# Patient Record
Sex: Female | Born: 1975
Health system: Southern US, Community
[De-identification: ages and names within clinical notes are randomized; demographics above are authoritative.]

## PROBLEM LIST (undated history)

## (undated) DIAGNOSIS — K219 Gastro-esophageal reflux disease without esophagitis: Secondary | ICD-10-CM

## (undated) DIAGNOSIS — R519 Headache, unspecified: Secondary | ICD-10-CM

## (undated) DIAGNOSIS — R51 Headache: Secondary | ICD-10-CM

## (undated) DIAGNOSIS — B019 Varicella without complication: Secondary | ICD-10-CM

## (undated) DIAGNOSIS — T7840XA Allergy, unspecified, initial encounter: Secondary | ICD-10-CM

## (undated) DIAGNOSIS — Z91030 Bee allergy status: Secondary | ICD-10-CM

## (undated) HISTORY — DX: Allergy, unspecified, initial encounter: T78.40XA

## (undated) HISTORY — DX: Bee allergy status: Z91.030

## (undated) HISTORY — DX: Gastro-esophageal reflux disease without esophagitis: K21.9

## (undated) HISTORY — DX: Headache, unspecified: R51.9

## (undated) HISTORY — PX: TONSILLECTOMY: SUR1361

## (undated) HISTORY — DX: Varicella without complication: B01.9

## (undated) HISTORY — PX: ADENOIDECTOMY: SUR15

## (undated) HISTORY — DX: Headache: R51

---

## 1999-04-19 ENCOUNTER — Ambulatory Visit (HOSPITAL_COMMUNITY): Admission: RE | Admit: 1999-04-19 | Discharge: 1999-04-19 | Payer: Self-pay | Admitting: Family Medicine

## 1999-04-19 ENCOUNTER — Encounter: Payer: Self-pay | Admitting: Family Medicine

## 2000-07-15 ENCOUNTER — Other Ambulatory Visit: Admission: RE | Admit: 2000-07-15 | Discharge: 2000-07-15 | Payer: Self-pay | Admitting: *Deleted

## 2001-07-19 ENCOUNTER — Other Ambulatory Visit: Admission: RE | Admit: 2001-07-19 | Discharge: 2001-07-19 | Payer: Self-pay | Admitting: Obstetrics and Gynecology

## 2005-10-12 ENCOUNTER — Ambulatory Visit: Payer: Self-pay | Admitting: Internal Medicine

## 2005-10-15 ENCOUNTER — Ambulatory Visit: Payer: Self-pay | Admitting: Family Medicine

## 2005-10-29 ENCOUNTER — Ambulatory Visit: Payer: Self-pay | Admitting: Internal Medicine

## 2005-11-17 ENCOUNTER — Encounter: Payer: Self-pay | Admitting: Internal Medicine

## 2005-11-17 ENCOUNTER — Ambulatory Visit: Payer: Self-pay | Admitting: Internal Medicine

## 2005-12-17 ENCOUNTER — Ambulatory Visit: Payer: Self-pay | Admitting: Family Medicine

## 2005-12-25 ENCOUNTER — Ambulatory Visit: Payer: Self-pay | Admitting: *Deleted

## 2006-01-08 ENCOUNTER — Ambulatory Visit: Payer: Self-pay | Admitting: Internal Medicine

## 2006-02-03 ENCOUNTER — Ambulatory Visit: Payer: Self-pay | Admitting: Internal Medicine

## 2006-05-25 ENCOUNTER — Emergency Department (HOSPITAL_COMMUNITY): Admission: EM | Admit: 2006-05-25 | Discharge: 2006-05-25 | Payer: Self-pay | Admitting: *Deleted

## 2006-05-31 ENCOUNTER — Ambulatory Visit: Payer: Self-pay | Admitting: Internal Medicine

## 2006-06-18 ENCOUNTER — Ambulatory Visit: Payer: Self-pay | Admitting: Family Medicine

## 2006-08-02 ENCOUNTER — Ambulatory Visit: Payer: Self-pay | Admitting: Internal Medicine

## 2006-09-14 ENCOUNTER — Ambulatory Visit: Payer: Self-pay | Admitting: Internal Medicine

## 2007-02-07 ENCOUNTER — Ambulatory Visit: Payer: Self-pay | Admitting: Internal Medicine

## 2007-02-08 ENCOUNTER — Ambulatory Visit: Payer: Self-pay | Admitting: Internal Medicine

## 2007-02-09 ENCOUNTER — Ambulatory Visit: Payer: Self-pay | Admitting: Internal Medicine

## 2007-03-07 ENCOUNTER — Ambulatory Visit: Payer: Self-pay | Admitting: Internal Medicine

## 2007-04-01 ENCOUNTER — Ambulatory Visit: Payer: Self-pay | Admitting: Internal Medicine

## 2007-06-01 ENCOUNTER — Ambulatory Visit: Payer: Self-pay | Admitting: Internal Medicine

## 2007-06-04 ENCOUNTER — Ambulatory Visit (HOSPITAL_COMMUNITY): Admission: RE | Admit: 2007-06-04 | Discharge: 2007-06-04 | Payer: Self-pay | Admitting: Internal Medicine

## 2007-08-31 ENCOUNTER — Encounter (INDEPENDENT_AMBULATORY_CARE_PROVIDER_SITE_OTHER): Payer: Self-pay | Admitting: *Deleted

## 2008-02-09 ENCOUNTER — Ambulatory Visit: Payer: Self-pay | Admitting: Internal Medicine

## 2008-03-22 ENCOUNTER — Ambulatory Visit: Payer: Self-pay | Admitting: Internal Medicine

## 2008-03-22 ENCOUNTER — Encounter: Payer: Self-pay | Admitting: Internal Medicine

## 2008-03-22 LAB — CONVERTED CEMR LAB
Chlamydia, DNA Probe: NEGATIVE
GC Probe Amp, Genital: NEGATIVE
Testosterone: 19.44 ng/dL (ref 10–70)

## 2008-05-17 ENCOUNTER — Ambulatory Visit: Payer: Self-pay | Admitting: Internal Medicine

## 2008-11-07 ENCOUNTER — Ambulatory Visit: Payer: Self-pay | Admitting: Internal Medicine

## 2008-11-23 ENCOUNTER — Ambulatory Visit: Payer: Self-pay | Admitting: Internal Medicine

## 2008-12-11 ENCOUNTER — Ambulatory Visit (HOSPITAL_COMMUNITY): Admission: AD | Admit: 2008-12-11 | Discharge: 2008-12-11 | Payer: Self-pay | Admitting: Internal Medicine

## 2008-12-13 ENCOUNTER — Ambulatory Visit: Payer: Self-pay | Admitting: Internal Medicine

## 2008-12-22 ENCOUNTER — Ambulatory Visit (HOSPITAL_COMMUNITY): Admission: RE | Admit: 2008-12-22 | Discharge: 2008-12-22 | Payer: Self-pay | Admitting: Internal Medicine

## 2009-01-02 ENCOUNTER — Ambulatory Visit (HOSPITAL_BASED_OUTPATIENT_CLINIC_OR_DEPARTMENT_OTHER): Admission: RE | Admit: 2009-01-02 | Discharge: 2009-01-02 | Payer: Self-pay | Admitting: Internal Medicine

## 2009-01-05 ENCOUNTER — Ambulatory Visit: Payer: Self-pay | Admitting: Internal Medicine

## 2009-01-31 ENCOUNTER — Ambulatory Visit: Payer: Self-pay | Admitting: Internal Medicine

## 2009-05-01 ENCOUNTER — Ambulatory Visit: Payer: Self-pay | Admitting: Internal Medicine

## 2009-05-20 ENCOUNTER — Encounter: Payer: Self-pay | Admitting: Internal Medicine

## 2009-05-20 ENCOUNTER — Ambulatory Visit: Payer: Self-pay | Admitting: Internal Medicine

## 2009-05-20 DIAGNOSIS — R21 Rash and other nonspecific skin eruption: Secondary | ICD-10-CM | POA: Insufficient documentation

## 2009-07-11 ENCOUNTER — Ambulatory Visit: Payer: Self-pay | Admitting: Internal Medicine

## 2009-12-12 ENCOUNTER — Ambulatory Visit: Payer: Self-pay | Admitting: Internal Medicine

## 2010-03-31 ENCOUNTER — Ambulatory Visit: Payer: Self-pay | Admitting: Internal Medicine

## 2010-04-01 ENCOUNTER — Encounter (INDEPENDENT_AMBULATORY_CARE_PROVIDER_SITE_OTHER): Payer: Self-pay | Admitting: Internal Medicine

## 2010-06-24 ENCOUNTER — Inpatient Hospital Stay (HOSPITAL_COMMUNITY): Admission: AD | Admit: 2010-06-24 | Discharge: 2010-06-25 | Payer: Self-pay | Admitting: Obstetrics and Gynecology

## 2010-07-01 ENCOUNTER — Inpatient Hospital Stay (HOSPITAL_COMMUNITY): Admission: AD | Admit: 2010-07-01 | Discharge: 2010-07-03 | Payer: Self-pay | Admitting: Obstetrics and Gynecology

## 2010-07-04 ENCOUNTER — Observation Stay (HOSPITAL_COMMUNITY): Admission: AD | Admit: 2010-07-04 | Discharge: 2010-07-05 | Payer: Self-pay | Admitting: Obstetrics and Gynecology

## 2010-07-04 ENCOUNTER — Ambulatory Visit: Payer: Self-pay | Admitting: Nurse Practitioner

## 2010-09-23 ENCOUNTER — Ambulatory Visit (HOSPITAL_COMMUNITY): Admission: RE | Admit: 2010-09-23 | Discharge: 2010-09-25 | Payer: Self-pay | Admitting: Obstetrics and Gynecology

## 2010-09-23 ENCOUNTER — Encounter (INDEPENDENT_AMBULATORY_CARE_PROVIDER_SITE_OTHER): Payer: Self-pay | Admitting: Obstetrics and Gynecology

## 2011-02-26 LAB — COMPREHENSIVE METABOLIC PANEL
ALT: 20 U/L (ref 0–35)
Albumin: 3.4 g/dL — ABNORMAL LOW (ref 3.5–5.2)
Alkaline Phosphatase: 88 U/L (ref 39–117)
BUN: 13 mg/dL (ref 6–23)
Calcium: 9.1 mg/dL (ref 8.4–10.5)
Glucose, Bld: 95 mg/dL (ref 70–99)
Potassium: 3.8 mEq/L (ref 3.5–5.1)
Sodium: 140 mEq/L (ref 135–145)
Total Protein: 6.3 g/dL (ref 6.0–8.3)

## 2011-02-26 LAB — CBC
HCT: 39.6 % (ref 36.0–46.0)
Hemoglobin: 11.2 g/dL — ABNORMAL LOW (ref 12.0–15.0)
MCH: 31.7 pg (ref 26.0–34.0)
MCHC: 33.9 g/dL (ref 30.0–36.0)
MCHC: 34.6 g/dL (ref 30.0–36.0)
Platelets: 224 10*3/uL (ref 150–400)
RDW: 13 % (ref 11.5–15.5)
WBC: 5.4 10*3/uL (ref 4.0–10.5)

## 2011-02-26 LAB — DIFFERENTIAL
Lymphs Abs: 2.4 10*3/uL (ref 0.7–4.0)
Monocytes Absolute: 0.3 10*3/uL (ref 0.1–1.0)
Monocytes Relative: 6 % (ref 3–12)
Neutro Abs: 2.5 10*3/uL (ref 1.7–7.7)
Neutrophils Relative %: 47 % (ref 43–77)

## 2011-02-26 LAB — SURGICAL PCR SCREEN
MRSA, PCR: NEGATIVE
Staphylococcus aureus: NEGATIVE

## 2011-02-28 LAB — CBC
HCT: 30.9 % — ABNORMAL LOW (ref 36.0–46.0)
Hemoglobin: 10.9 g/dL — ABNORMAL LOW (ref 12.0–15.0)
Hemoglobin: 11.5 g/dL — ABNORMAL LOW (ref 12.0–15.0)
MCH: 33.1 pg (ref 26.0–34.0)
MCHC: 34.3 g/dL (ref 30.0–36.0)
MCV: 93.4 fL (ref 78.0–100.0)
MCV: 93.9 fL (ref 78.0–100.0)
Platelets: 211 10*3/uL (ref 150–400)
Platelets: 224 10*3/uL (ref 150–400)
Platelets: 233 10*3/uL (ref 150–400)
RBC: 3.29 MIL/uL — ABNORMAL LOW (ref 3.87–5.11)
RDW: 14.5 % (ref 11.5–15.5)
RDW: 14.6 % (ref 11.5–15.5)
RDW: 14.8 % (ref 11.5–15.5)
WBC: 10.3 10*3/uL (ref 4.0–10.5)
WBC: 15.5 10*3/uL — ABNORMAL HIGH (ref 4.0–10.5)

## 2011-02-28 LAB — COMPREHENSIVE METABOLIC PANEL
ALT: 22 U/L (ref 0–35)
AST: 18 U/L (ref 0–37)
Albumin: 2.4 g/dL — ABNORMAL LOW (ref 3.5–5.2)
Calcium: 8.7 mg/dL (ref 8.4–10.5)
GFR calc Af Amer: >60 mL/min (ref >60)
Sodium: 136 mEq/L (ref 135–145)
Total Protein: 5.5 g/dL — ABNORMAL LOW (ref 6.0–8.3)

## 2011-04-28 NOTE — Procedures (Signed)
NAME:  Kathy Gamble, START NO.:  [id number]   MEDICAL RECORD NO.:  [id number]          PATIENT TYPE:  OUT   LOCATION:  SLEEP CENTER                 FACILITY:  [facility]   PHYSICIAN:  Clinton D. Maple Hudson, MD, FCCP, FACPDATE OF BIRTH:  1976/03/02   DATE OF STUDY:  01/02/2009                            NOCTURNAL POLYSOMNOGRAM   REFERRING PHYSICIAN:  Dineen Kid. Reche Dixon, M.D.   INDICATION FOR STUDY:  Hypersomnia with sleep apnea.   EPWORTH SLEEPINESS SCORE:  7/24.  BMI 33.  Weight 230 pounds, height 70  inches, neck 14 inches.   MEDICATIONS:  Home medications are charted and reviewed.   SLEEP ARCHITECTURE:  Total sleep time 274 minutes with sleep efficiency  75.3%.  Stage I was 15.3%.  Stage II 74.7%.  Stage III absent.  REM 10%  of total sleep time.  Sleep latency 22 minutes.  REM latency 117  minutes.  Awake after sleep onset 67.5 minutes.  Arousal index 31.3.  The patient took APAP/butalbital/caffeine, and Tussin at DM at 8:30 p.m.   RESPIRATORY DATA:  Apnea-hypopnea index (AHI) 2.6 per hour, respiratory  disturbance index (RDI) 9.2 per hour.  A total of 12 events was scored  including 1 obstructive apnea and 11 hypopneas.  Events were mostly  associated to be supine sleep position.  REM AHI 17.5.  There were  insufficient events to qualify for CPAP titration by split protocol on  the study night.   OXYGEN DATA:  Moderate snoring with oxygen desaturation to a nadir of  90%.  Mean oxygen saturation through the study was 95% on room air.   CARDIAC DATA:  Normal sinus rhythm.   MOVEMENT-PARASOMNIA:  Occasional limb jerk with arousal, insignificant.  Bathroom x1.   IMPRESSIONS-RECOMMENDATIONS:  Occasional respiratory events with sleep  disturbance, AHI 2.6 per hour (normal range 0 to 5 per hour).  By  respiratory disturbance index it is 9.2 per hour.  She may be classified  as having very mild sleep apnea, probably more apparent on some sleep  nights than others.  She had  insufficient events to meet requirements  for CPAP titration by split protocol on the study night that could be  considered for CPAP trial if  more conservative measures including encouragement to sleep on sides,  weight loss, and treatment for any significant upper airway congestion  or obstruction were insufficient.      Clinton D. Maple Hudson, MD, [facility], FACP  Diplomate, Biomedical engineer of Sleep Medicine  Electronically Signed     CDY/MEDQ  D:  01/05/2009 13:20:19  T:  01/06/2009 16:10:96  Job:  [id number]

## 2011-09-03 ENCOUNTER — Inpatient Hospital Stay (HOSPITAL_COMMUNITY)
Admission: AD | Admit: 2011-09-03 | Discharge: 2011-09-03 | Disposition: A | Discharge status: Home / Self Care | Payer: Self-pay | Source: Ambulatory Visit | Attending: Obstetrics & Gynecology | Admitting: Obstetrics & Gynecology

## 2011-09-03 ENCOUNTER — Encounter (HOSPITAL_COMMUNITY): Payer: Self-pay | Admitting: *Deleted

## 2011-09-03 DIAGNOSIS — N946 Dysmenorrhea, unspecified: Secondary | ICD-10-CM

## 2011-09-03 LAB — WET PREP, GENITAL: Clue Cells Wet Prep HPF POC: NONE SEEN

## 2011-09-03 LAB — CBC
HCT: 39.2 % (ref 36.0–46.0)
Hemoglobin: 13.6 g/dL (ref 12.0–15.0)
MCH: 30.2 pg (ref 26.0–34.0)
MCHC: 34.7 g/dL (ref 30.0–36.0)

## 2011-09-03 MED ORDER — KETOROLAC TROMETHAMINE 60 MG/2ML IM SOLN: 60.0000 mg | Freq: Once | INTRAMUSCULAR | Status: DC

## 2011-09-03 NOTE — ED Provider Notes (Signed)
History     CSN: [id number] Arrival date & time: 09/03/2011 12:22 PM  Chief Complaint  Patient presents with  . Vaginal Bleeding    HPI  (Consider location/radiation/quality/duration/timing/severity/associated sxs/prior treatment)  HPILucy C Gamble is a 35 y.o. female who presents to [facility] for vaginal bleeding that started 3 days ago like a normal period but today passed a clot or tissue and has had shooting pains in lower abdomen with radiation to thighs. Had depo provera in August after that period.   No past medical history on file.  No past surgical history on file.  No family history on file.  History  Substance Use Topics  . Smoking status: Not on file  . Smokeless tobacco: Not on file  . Alcohol Use: Not on file    OB History    No data available      Review of Systems  Review of Systems  Constitutional: Positive for chills and fatigue. Negative for fever and diaphoresis.  HENT: Positive for sore throat. Negative for ear pain, congestion, facial swelling, neck pain, neck stiffness, dental problem and sinus pressure.   Eyes: Negative for photophobia, pain and discharge.  Respiratory: Positive for cough. Negative for chest tightness and wheezing.   Cardiovascular: Negative.   Gastrointestinal: Positive for abdominal pain and constipation. Negative for nausea, vomiting, diarrhea and abdominal distention.  Genitourinary: Positive for dysuria, vaginal bleeding, vaginal pain and pelvic pain. Negative for frequency, flank pain and difficulty urinating.  Musculoskeletal: Positive for back pain. Negative for myalgias and gait problem.  Skin: Negative for color change and rash.  Neurological: Positive for dizziness and headaches. Negative for speech difficulty, weakness, light-headedness and numbness.  Psychiatric/Behavioral:       Depression    Allergies  Latex  Home Medications  No current outpatient prescriptions on file.  Physical Exam    BP 132/74  Pulse 88   Temp(Src) 98.2 F (36.8 C) (Oral)  Resp 20  Ht 5\' 9"  (1.753 m)  Wt 243 lb (110.224 kg)  BMI 35.88 kg/m2  Physical Exam  Nursing note and vitals reviewed. Constitutional: She is oriented to person, place, and time. She appears well-developed and well-nourished. No distress.  Eyes: EOM are normal.  Neck: Neck supple.  Pulmonary/Chest: Effort normal.  Abdominal: Soft. There is no tenderness.  Genitourinary:       Small amount of blood vaginal vault. No cervical motion tenderness, no adnexal tenderness. No palpable enlargement of the uterus. Difficulty with exam due to patient habitus.  Musculoskeletal: Normal range of motion.  Neurological: She is alert and oriented to person, place, and time. No cranial nerve deficit.  Skin: Skin is warm and dry.    ED Course  Procedures  Results for orders placed during the hospital encounter of 09/03/11 (from the past 24 hour(s))  POCT PREGNANCY, URINE     Status: Normal   Collection Time   09/03/11  1:13 PM      Component Value Range   Preg Test, Ur NEGATIVE    CBC     Status: Abnormal   Collection Time   09/03/11  2:54 PM      Component Value Range   WBC 11.0 (*) 4.0 - 10.5 (K/uL)   RBC 4.51  3.87 - 5.11 (MIL/uL)   Hemoglobin 13.6  12.0 - 15.0 (g/dL)   HCT 40.9  81.1 - 91.4 (%)   MCV 86.9  78.0 - 100.0 (fL)   MCH 30.2  26.0 - 34.0 (pg)   MCHC  34.7  30.0 - 36.0 (g/dL)   RDW 16.1  09.6 - 04.5 (%)   Platelets 275  150 - 400 (K/uL)   Assessment:  Dysmenorrhea  Plan:  Ibuprofen prn   Return as needed   [facility], NP 09/07/11 0221  Kerrie Buffalo, NP 09/09/11 1101

## 2011-09-03 NOTE — Progress Notes (Signed)
Been on period for 3 days, passed something ( brought in a mass that she passed). Hadn't had period in a long time, had depo Aug.

## 2011-09-04 LAB — GC/CHLAMYDIA PROBE AMP, GENITAL
Chlamydia, DNA Probe: NEGATIVE
GC Probe Amp, Genital: NEGATIVE

## 2011-09-11 NOTE — ED Provider Notes (Signed)
Attestation of Attending Supervision of Advanced Practitioner: Evaluation and management procedures were performed by the PA/NP/CNM/OB Fellow under my supervision/collaboration. Chart reviewed and agree with management and plan.  Tyke Outman A 09/11/2011 3:12 PM   

## 2011-12-27 ENCOUNTER — Emergency Department (HOSPITAL_COMMUNITY)
Admission: EM | Admit: 2011-12-27 | Discharge: 2011-12-27 | Disposition: A | Discharge status: Home / Self Care | Payer: Medicaid Other | Source: Home / Self Care | Attending: Emergency Medicine | Admitting: Emergency Medicine

## 2011-12-27 ENCOUNTER — Encounter (HOSPITAL_COMMUNITY): Payer: Self-pay | Admitting: *Deleted

## 2011-12-27 ENCOUNTER — Emergency Department (INDEPENDENT_AMBULATORY_CARE_PROVIDER_SITE_OTHER): Payer: Medicaid Other

## 2011-12-27 DIAGNOSIS — H109 Unspecified conjunctivitis: Secondary | ICD-10-CM

## 2011-12-27 DIAGNOSIS — J40 Bronchitis, not specified as acute or chronic: Secondary | ICD-10-CM

## 2011-12-27 MED ORDER — BENZONATATE 200 MG PO CAPS: 200.0000 mg | ORAL_CAPSULE | Freq: Three times a day (TID) | ORAL | Status: AC | PRN

## 2011-12-27 MED ORDER — ALBUTEROL SULFATE HFA 108 (90 BASE) MCG/ACT IN AERS: 1.0000 | INHALATION_SPRAY | Freq: Four times a day (QID) | RESPIRATORY_TRACT | Status: DC | PRN

## 2011-12-27 MED ORDER — AZITHROMYCIN 250 MG PO TABS: ORAL_TABLET | ORAL | Status: AC

## 2011-12-27 MED ORDER — POLYMYXIN B-TRIMETHOPRIM 10000-0.1 UNIT/ML-% OP SOLN: 1.0000 [drp] | OPHTHALMIC | Status: AC

## 2011-12-27 MED ORDER — ACETAMINOPHEN-CODEINE #3 300-30 MG PO TABS: 1.0000 | ORAL_TABLET | ORAL | Status: AC | PRN

## 2011-12-27 NOTE — ED Notes (Signed)
Co sorethroat with eyes crusting and red x 6 days

## 2011-12-27 NOTE — ED Provider Notes (Signed)
Chief Complaint  Patient presents with  . Sore Throat    History of Present Illness:  Kathy Gamble has had a 2 to three-day history of sore throat, headache, redness of the eyes, nasal congestion with rhinorrhea and sneezing, yellowish drainage, sinus pressure, cough productive of bloody sputum, and aching in her back and her knees.  Review of Systems:  Other than noted above, the patient denies any of the following symptoms. Systemic:  No fever, chills, sweats, fatigue, myalgias, headache, or anorexia. Eye:  No redness, pain or drainage. ENT:  No earache, nasal congestion, rhinorrhea, sinus pressure, or sore throat. Lungs:  No cough, sputum production, wheezing, shortness of breath. Or chest pain. GI:  No nausea, vomiting, abdominal pain or diarrhea. Skin:  No rash or itching.  PMFSH:  Past medical history, family history, social history, meds, and allergies were reviewed.  Physical Exam:   Vital signs:  BP 127/82  Pulse 90  Temp(Src) 98.4 F (36.9 C) (Oral)  Resp 18  SpO2 97% General:  Alert, in no distress. Eye:  No conjunctival injection or drainage. ENT:  TMs and canals were normal, without erythema or inflammation.  Nasal mucosa was clear and uncongested, without drainage.  Mucous membranes were moist.  Pharynx was clear, without exudate or drainage.  There were no oral ulcerations or lesions. Neck:  Supple, no adenopathy, tenderness or mass. Lungs:  No respiratory distress.  Lungs were clear to auscultation, without wheezes, rales or rhonchi.  Breath sounds were clear and equal bilaterally. Heart:  Regular rhythm, without gallops, murmers or rubs. Skin:  Clear, warm, and dry, without rash or lesions.  Labs:      Radiology:  Dg Chest 2 View  12/27/2011  *RADIOLOGY REPORT*  Clinical Data: Sore throat.  Spitting up blood.  CHEST - 2 VIEW  Comparison: None.  Findings: Lungs are clear.  Heart size is normal.  No pneumothorax or pleural fluid.  No focal bony abnormality.  IMPRESSION: No  acute disease.  Original Report Authenticated By: Bernadene Bell. Maricela Curet, M.D.    Medications given in UCC:  None  Assessment:   Diagnoses that have been ruled out:  None  Diagnoses that are still under consideration:  None  Final diagnoses:  Bronchitis  Conjunctivitis      Plan:   1.  The following meds were prescribed:   New Prescriptions   ACETAMINOPHEN-CODEINE (TYLENOL #3) 300-30 MG PER TABLET    Take 1-2 tablets by mouth every 4 (four) hours as needed for pain.   ALBUTEROL (PROVENTIL HFA;VENTOLIN HFA) 108 (90 BASE) MCG/ACT INHALER    Inhale 1-2 puffs into the lungs every 6 (six) hours as needed for wheezing.   AZITHROMYCIN (ZITHROMAX Z-PAK) 250 MG TABLET    Take as directed.   BENZONATATE (TESSALON) 200 MG CAPSULE    Take 1 capsule (200 mg total) by mouth 3 (three) times daily as needed for cough.   TRIMETHOPRIM-POLYMYXIN B (POLYTRIM) OPHTHALMIC SOLUTION    Place 1 drop into both eyes every 4 (four) hours.   2.  The patient was instructed in symptomatic care and handouts were given. 3.  The patient was told to return if becoming worse in any way, if no better in 3 or 4 days, and given some red flag symptoms that would indicate earlier return.   Roque Lias, MD 12/27/11 2014

## 2012-04-28 ENCOUNTER — Emergency Department (HOSPITAL_COMMUNITY)
Admission: EM | Admit: 2012-04-28 | Discharge: 2012-04-29 | Disposition: A | Discharge status: Home / Self Care | Payer: No Typology Code available for payment source | Attending: Emergency Medicine | Admitting: Emergency Medicine

## 2012-04-28 ENCOUNTER — Encounter (HOSPITAL_COMMUNITY): Payer: Self-pay

## 2012-04-28 DIAGNOSIS — IMO0002 Reserved for concepts with insufficient information to code with codable children: Secondary | ICD-10-CM | POA: Insufficient documentation

## 2012-04-28 MED ORDER — CEFIXIME 400 MG PO TABS
ORAL_TABLET | ORAL | Status: AC
  Administered 2012-04-28: 400 mg via ORAL
  Filled 2012-04-28: qty 1

## 2012-04-28 MED ORDER — AZITHROMYCIN 1 G PO PACK
PACK | ORAL | Status: AC
  Administered 2012-04-28: 1 g via ORAL
  Filled 2012-04-28: qty 1

## 2012-04-28 MED ORDER — PROMETHAZINE HCL 25 MG PO TABS
ORAL_TABLET | ORAL | Status: AC
  Administered 2012-04-28: 25 mg via ORAL
  Filled 2012-04-28: qty 3

## 2012-04-28 MED ORDER — METRONIDAZOLE 500 MG PO TABS
ORAL_TABLET | ORAL | Status: AC
  Administered 2012-04-28: 2000 mg via ORAL
  Filled 2012-04-28: qty 4

## 2012-04-28 MED ORDER — LEVONORGESTREL 0.75 MG PO TABS
ORAL_TABLET | ORAL | Status: AC
  Administered 2012-04-28: 23:00:00 via ORAL
  Filled 2012-04-28: qty 2
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
## 2012-04-28 NOTE — ED Notes (Signed)
Pt taken to SANE room by SANE nurse. 

## 2012-04-28 NOTE — ED Notes (Signed)
SANE RN and PD at bedside.

## 2012-04-28 NOTE — ED Notes (Signed)
Pt to RR.  Gave pt cup for sample but advised not to use wipes.

## 2012-05-02 LAB — POCT PREGNANCY, URINE: Preg Test, Ur: NEGATIVE

## 2012-07-20 ENCOUNTER — Emergency Department: Payer: Self-pay | Admitting: Emergency Medicine

## 2013-07-20 ENCOUNTER — Emergency Department: Payer: Self-pay | Admitting: Emergency Medicine

## 2014-05-19 ENCOUNTER — Emergency Department: Payer: Self-pay | Admitting: Emergency Medicine

## 2014-10-15 ENCOUNTER — Encounter (HOSPITAL_COMMUNITY): Payer: Self-pay

## 2014-12-28 ENCOUNTER — Encounter (INDEPENDENT_AMBULATORY_CARE_PROVIDER_SITE_OTHER): Payer: Self-pay

## 2014-12-28 ENCOUNTER — Encounter: Payer: Self-pay | Admitting: Internal Medicine

## 2014-12-28 ENCOUNTER — Ambulatory Visit (INDEPENDENT_AMBULATORY_CARE_PROVIDER_SITE_OTHER): Payer: 59 | Admitting: Internal Medicine

## 2014-12-28 VITALS — BP 118/82 | HR 76 | Temp 98.4°F | Ht 67.85 in | Wt 246.0 lb

## 2014-12-28 DIAGNOSIS — R519 Headache, unspecified: Secondary | ICD-10-CM | POA: Insufficient documentation

## 2014-12-28 DIAGNOSIS — K219 Gastro-esophageal reflux disease without esophagitis: Secondary | ICD-10-CM | POA: Insufficient documentation

## 2014-12-28 DIAGNOSIS — J302 Other seasonal allergic rhinitis: Secondary | ICD-10-CM | POA: Insufficient documentation

## 2014-12-28 DIAGNOSIS — R51 Headache: Secondary | ICD-10-CM

## 2014-12-28 DIAGNOSIS — L989 Disorder of the skin and subcutaneous tissue, unspecified: Secondary | ICD-10-CM

## 2014-12-28 MED ORDER — OMEPRAZOLE 40 MG PO CPDR: 40.0000 mg | DELAYED_RELEASE_CAPSULE | Freq: Every day | ORAL | Status: DC

## 2014-12-28 NOTE — Progress Notes (Signed)
HPI  Pt presents to the clinic today to establish care. She is transferring care from [facility] NP.  She does have some concerns today about some skin lesions on her back. She reports that she was told 1 year ago that this was precancerous and she needs to have it removed. She lost her insurance and was not able to have it biopsied. She does request referral for dermatology for further evaluation.  Flu: yearly, needs one today Tetanus: unsure LMP: 12/15/14 Pap Smear: > 2 years ago Dentist: as needed   GERD: She reports that she takes Prilosec OTC daily. She continues to have reflux symptoms despite being on the Prilosec. She knows that certain foods cause the reflux but she continues to eat them anyway.  Frequent Headaches: She reports that she wakes up every day and goes to bed every day with a headache. She reports that it starts in her forehead and usually wrap around her eyes. She denies blurred vision or dizziness. She does have sensitivity to light but not sound. She denies N/V. She also takes Ibuprofen OTC which does seem to help.  Allergy: Taking zyrtec daily.   Past Medical History  Diagnosis Date  . Chicken pox   . GERD (gastroesophageal reflux disease)   . Frequent headaches   . Allergy     Current Outpatient Prescriptions  Medication Sig Dispense Refill  . cetirizine (ZYRTEC) 10 MG tablet Take 10 mg by mouth daily.    Marland Kitchen topiramate (TOPAMAX) 100 MG tablet TAKE ONE TABLET BY MOUTH AT BEDTIME     No current facility-administered medications for this visit.    Allergies  Allergen Reactions  . Bee Venom Itching  . Latex Rash    The powder in the gloves causes rash    Family History  Problem Relation Age of Onset  . Stroke Mother   . Mental retardation Mother   . Cancer Maternal Grandmother   . Cancer Paternal Grandmother     Breast  . Heart disease Paternal Grandfather     History   Social History  . Marital Status: Married    Spouse Name: N/A   Number of Children: N/A  . Years of Education: N/A   Occupational History  . Not on file.   Social History Main Topics  . Smoking status: Never Smoker   . Smokeless tobacco: Not on file  . Alcohol Use: No  . Drug Use: No  . Sexual Activity: Yes    Birth Control/ Protection: Injection   Other Topics Concern  . Not on file   Social History Narrative    ROS:  Constitutional: Denies fever, malaise, fatigue, headache or abrupt weight changes.  Respiratory: Denies difficulty breathing, shortness of breath, cough or sputum production.   Cardiovascular: Denies chest pain, chest tightness, palpitations or swelling in the hands or feet.  Gastrointestinal: Pt reports reflux. Denies abdominal pain, bloating, constipation, diarrhea or blood in the stool.  Skin: Pt reports abnormal lesion on back. Denies redness, rashes, ulcercations.  Neurological: Denies dizziness, difficulty with memory, difficulty with speech or problems with balance and coordination.   No other specific complaints in a complete review of systems (except as listed in HPI above).  PE:  BP 118/82 mmHg  Pulse 76  Temp(Src) 98.4 F (36.9 C) (Oral)  Ht 5' 7.85" (1.723 m)  Wt 246 lb (111.585 kg)  BMI 37.59 kg/m2  SpO2 98%  LMP 12/15/2014 Wt Readings from Last 3 Encounters:  12/28/14 246 lb (111.585 kg)  09/03/11 243 lb (110.224 kg)  05/20/09 275 lb (124.739 kg)    General: Appears herstated age, obese but well developed, well nourished in NAD. HEENT: Head: normal shape and size; Eyes: sclera white, no icterus, conjunctiva pink; Ears: Tm's gray and intact, normal light reflex; Nose: mucosa pink and moist, septum midline; Throat/Mouth: Teeth present, mucosa pink and moist, no lesions or ulcerations noted.  Cardiovascular: Normal rate and rhythm. S1,S2 noted.  No murmur, rubs or gallops noted.  Pulmonary/Chest: Normal effort and positive vesicular breath sounds. No respiratory distress. No wheezes, rales or ronchi  noted.  Abdomen: Soft and nontender. Normal bowel sounds, no bruits noted. No distention or masses noted. Liver, spleen and kidneys non palpable. Skin: 1 cm oval shaped irregular lesion noted on midline back. Darker in the center.   BMET    Component Value Date/Time   NA 140 09/22/2010 1020   K 3.8 09/22/2010 1020   CL 104 09/22/2010 1020   CO2 31 09/22/2010 1020   GLUCOSE 95 09/22/2010 1020   BUN 13 09/22/2010 1020   CREATININE 0.64 09/22/2010 1020   CALCIUM 9.1 09/22/2010 1020   GFRNONAA >60 09/22/2010 1020   GFRAA  09/22/2010 1020    >60        The eGFR has been calculated using the MDRD equation. This calculation has not been validated in all clinical situations. eGFR's persistently <60 mL/min signify possible Chronic Kidney Disease.    Lipid Panel  No results found for: CHOL, TRIG, HDL, CHOLHDL, VLDL, LDLCALC  CBC    Component Value Date/Time   WBC 11.0* 09/03/2011 1454   RBC 4.51 09/03/2011 1454   HGB 13.6 09/03/2011 1454   HCT 39.2 09/03/2011 1454   PLT 275 09/03/2011 1454   MCV 86.9 09/03/2011 1454   MCH 30.2 09/03/2011 1454   MCHC 34.7 09/03/2011 1454   RDW 12.7 09/03/2011 1454   LYMPHSABS 2.4 09/22/2010 1020   MONOABS 0.3 09/22/2010 1020   EOSABS 0.1 09/22/2010 1020   BASOSABS 0.0 09/22/2010 1020    Hgb A1C No results found for: HGBA1C   Assessment and Plan:  Skin lesion of back:  Will refer to derm for further and evaluation and treatment  Make an appt for physical ASAP

## 2014-12-28 NOTE — Assessment & Plan Note (Signed)
Will start Prilosec 40 mg daily Handout given on "diet information for GERD"

## 2014-12-28 NOTE — Assessment & Plan Note (Signed)
Symptoms controlled on zyrtec

## 2014-12-28 NOTE — Progress Notes (Signed)
Pre visit review using our clinic review tool, if applicable. No additional management support is needed unless otherwise documented below in the visit note. 

## 2014-12-28 NOTE — Patient Instructions (Signed)

## 2014-12-28 NOTE — Assessment & Plan Note (Signed)
On topomax daily Discussed daily ibuprofen use- she will try to cut down

## 2014-12-31 ENCOUNTER — Ambulatory Visit: Payer: Medicaid Other | Admitting: Internal Medicine

## 2015-01-02 ENCOUNTER — Encounter: Payer: Self-pay | Admitting: Internal Medicine

## 2015-01-02 ENCOUNTER — Other Ambulatory Visit (HOSPITAL_COMMUNITY)
Admission: RE | Admit: 2015-01-02 | Discharge: 2015-01-02 | Disposition: A | Discharge status: Home / Self Care | Payer: 59 | Source: Ambulatory Visit | Attending: Internal Medicine | Admitting: Internal Medicine

## 2015-01-02 ENCOUNTER — Ambulatory Visit (INDEPENDENT_AMBULATORY_CARE_PROVIDER_SITE_OTHER): Payer: 59 | Admitting: Internal Medicine

## 2015-01-02 VITALS — BP 120/76 | HR 98 | Temp 98.1°F | Ht 67.85 in | Wt 247.0 lb

## 2015-01-02 DIAGNOSIS — N760 Acute vaginitis: Secondary | ICD-10-CM | POA: Diagnosis present

## 2015-01-02 DIAGNOSIS — Z113 Encounter for screening for infections with a predominantly sexual mode of transmission: Secondary | ICD-10-CM | POA: Diagnosis not present

## 2015-01-02 DIAGNOSIS — M545 Low back pain, unspecified: Secondary | ICD-10-CM

## 2015-01-02 DIAGNOSIS — T148 Other injury of unspecified body region: Secondary | ICD-10-CM

## 2015-01-02 DIAGNOSIS — Z124 Encounter for screening for malignant neoplasm of cervix: Secondary | ICD-10-CM

## 2015-01-02 DIAGNOSIS — Z1151 Encounter for screening for human papillomavirus (HPV): Secondary | ICD-10-CM | POA: Diagnosis present

## 2015-01-02 DIAGNOSIS — Z0000 Encounter for general adult medical examination without abnormal findings: Secondary | ICD-10-CM

## 2015-01-02 DIAGNOSIS — Z23 Encounter for immunization: Secondary | ICD-10-CM

## 2015-01-02 DIAGNOSIS — T148XXA Other injury of unspecified body region, initial encounter: Secondary | ICD-10-CM

## 2015-01-02 DIAGNOSIS — Z01419 Encounter for gynecological examination (general) (routine) without abnormal findings: Secondary | ICD-10-CM | POA: Insufficient documentation

## 2015-01-02 DIAGNOSIS — E669 Obesity, unspecified: Secondary | ICD-10-CM | POA: Insufficient documentation

## 2015-01-02 LAB — COMPREHENSIVE METABOLIC PANEL
ALK PHOS: 76 U/L (ref 39–117)
ALT: 11 U/L (ref 0–35)
AST: 14 U/L (ref 0–37)
Albumin: 3.7 g/dL (ref 3.5–5.2)
BILIRUBIN TOTAL: 0.4 mg/dL (ref 0.2–1.2)
BUN: 16 mg/dL (ref 6–23)
CO2: 29 mEq/L (ref 19–32)
CREATININE: 0.7 mg/dL (ref 0.40–1.20)
Calcium: 8.7 mg/dL (ref 8.4–10.5)
Chloride: 104 mEq/L (ref 96–112)
GFR: 99.46 mL/min (ref >60.00)
Glucose, Bld: 92 mg/dL (ref 70–99)
Potassium: 3.7 mEq/L (ref 3.5–5.1)
Sodium: 137 mEq/L (ref 135–145)
Total Protein: 6.5 g/dL (ref 6.0–8.3)

## 2015-01-02 LAB — CBC
HEMATOCRIT: 38.7 % (ref 36.0–46.0)
Hemoglobin: 13.3 g/dL (ref 12.0–15.0)
MCHC: 34.4 g/dL (ref 30.0–36.0)
MCV: 88.4 fl (ref 78.0–100.0)
Platelets: 295 10*3/uL (ref 150.0–400.0)
RBC: 4.38 Mil/uL (ref 3.87–5.11)
RDW: 13.1 % (ref 11.5–15.5)
WBC: 7.5 10*3/uL (ref 4.0–10.5)

## 2015-01-02 LAB — HEMOGLOBIN A1C: HEMOGLOBIN A1C: 5 % (ref 4.6–6.5)

## 2015-01-02 LAB — LIPID PANEL
CHOLESTEROL: 121 mg/dL (ref 0–200)
HDL: 40.3 mg/dL (ref >39.00)
LDL CALC: 66 mg/dL (ref 0–99)
NonHDL: 80.7
TRIGLYCERIDES: 76 mg/dL (ref 0.0–149.0)
Total CHOL/HDL Ratio: 3
VLDL: 15.2 mg/dL (ref 0.0–40.0)

## 2015-01-02 NOTE — Addendum Note (Signed)
Addended by: Desmond Dike on: 01/02/2015 04:55 PM   Modules accepted: Orders

## 2015-01-02 NOTE — Progress Notes (Signed)
Pre visit review using our clinic review tool, if applicable. No additional management support is needed unless otherwise documented below in the visit note. 

## 2015-01-02 NOTE — Addendum Note (Signed)
Addended by: Roena Malady on: 01/02/2015 04:34 PM   Modules accepted: Orders

## 2015-01-02 NOTE — Progress Notes (Signed)
Subjective:    Patient ID: Kathy Gamble, female    DOB: October 01, 1976, 39 y.o.   MRN: [id number]  HPI  Pt presents to the clinic today for her physical exam.  Flu: yearly, needs one today Tetanus: unsure LMP: 12/15/14 Pap Smear: > 2 years ago Dentist: as needed  Diet: she does not adhere to any specific diet plan at this time Exercise: she does not exercise  Review of Systems      Past Medical History  Diagnosis Date  . Chicken pox   . GERD (gastroesophageal reflux disease)   . Frequent headaches   . Allergy     Current Outpatient Prescriptions  Medication Sig Dispense Refill  . cetirizine (ZYRTEC) 10 MG tablet Take 10 mg by mouth daily.    Marland Kitchen omeprazole (PRILOSEC) 40 MG capsule Take 1 capsule (40 mg total) by mouth daily. 30 capsule 3  . topiramate (TOPAMAX) 100 MG tablet TAKE ONE TABLET BY MOUTH AT BEDTIME     No current facility-administered medications for this visit.    Allergies  Allergen Reactions  . Bee Venom Itching  . Latex Rash    The powder in the gloves causes rash    Family History  Problem Relation Age of Onset  . Stroke Mother   . Mental retardation Mother   . Cancer Maternal Grandmother   . Cancer Paternal Grandmother     Breast  . Heart disease Paternal Grandfather     History   Social History  . Marital Status: Married    Spouse Name: N/A    Number of Children: N/A  . Years of Education: N/A   Occupational History  . Not on file.   Social History Main Topics  . Smoking status: Never Smoker   . Smokeless tobacco: Not on file  . Alcohol Use: No  . Drug Use: No  . Sexual Activity: Yes    Birth Control/ Protection: None   Other Topics Concern  . Not on file   Social History Narrative     Constitutional: Denies fever, malaise, fatigue, headache or abrupt weight changes.  HEENT: Pt reports bilateral ear pain. Denies eye pain, eye redness, ringing in the ears, wax buildup, runny nose, nasal congestion, bloody nose, or sore  throat. Respiratory: Denies difficulty breathing, shortness of breath, cough or sputum production.   Cardiovascular: Denies chest pain, chest tightness, palpitations or swelling in the hands or feet.  Gastrointestinal: Denies abdominal pain, bloating, constipation, diarrhea or blood in the stool.  GU: Denies urgency, frequency, pain with urination, burning sensation, blood in urine, odor or discharge. Musculoskeletal: Pt reports low back pain. Denies decrease in range of motion, difficulty with gait, or joint pain and swelling.  Skin: Denies redness, rashes, lesions or ulcercations.  Neurological: Pt reports difficulty with memory. Denies dizziness, difficulty with speech or problems with balance and coordination.  Psych: Pt denies depression, anxiety, SI/HI.  No other specific complaints in a complete review of systems (except as listed in HPI above).  Objective:   Physical Exam  BP 120/76 mmHg  Pulse 98  Temp(Src) 98.1 F (36.7 C) (Oral)  Ht 5' 7.85" (1.723 m)  Wt 247 lb (112.038 kg)  BMI 37.74 kg/m2  SpO2 99%  LMP 12/15/2014 Wt Readings from Last 3 Encounters:  01/02/15 247 lb (112.038 kg)  12/28/14 246 lb (111.585 kg)  09/03/11 243 lb (110.224 kg)    General: Appears her stated age, obese but well developed, well nourished in NAD. Skin: Warm,  dry and intact. No rashes, lesions or ulcerations noted. HEENT: Head: normal shape and size, frontal and maxillary sinus tenderness noted; Eyes: sclera white, no icterus, conjunctiva pink, PERRLA and EOMs intact; Ears: Canals red but TM's gray and intact, normal light reflex; Nose: mucosa pink and moist, septum midline; Throat/Mouth: Teeth present, mucosa pink and moist, no exudate, lesions or ulcerations noted.  Neck:  Neck supple, trachea midline. No masses, lumps or thyromegaly present.  Cardiovascular: Normal rate and rhythm. S1,S2 noted.  No murmur, rubs or gallops noted. No JVD or BLE edema. No carotid bruits noted. Pulmonary/Chest:  Normal effort and positive vesicular breath sounds. No respiratory distress. No wheezes, rales or ronchi noted.  Abdomen: Soft and non in the RUQ. Normal bowel sounds, no bruits noted. No distention or masses noted. Liver, spleen and kidneys non palpable. Pelvic: Normal female anatomy. Cervical changes noted. Small amount of white, discharge noted. No CMT. Adnexa non palpable. Musculoskeletal: Normal flexion, extension and lateral rotation of the spine. No pain with palpation of the lumbar spine. Slight pain with palpation of the para lumbar muscles on the right. No signs of joint swelling. No difficulty with gait.  Neurological: Alert and oriented. Cranial nerves II-XII grossly intact. Coordination normal.  Psychiatric: Mood and affect normal. Behavior is normal. Judgment normal but thought process slowed.   BMET    Component Value Date/Time   NA 140 09/22/2010 1020   K 3.8 09/22/2010 1020   CL 104 09/22/2010 1020   CO2 31 09/22/2010 1020   GLUCOSE 95 09/22/2010 1020   BUN 13 09/22/2010 1020   CREATININE 0.64 09/22/2010 1020   CALCIUM 9.1 09/22/2010 1020   GFRNONAA >60 09/22/2010 1020   GFRAA  09/22/2010 1020    >60        The eGFR has been calculated using the MDRD equation. This calculation has not been validated in all clinical situations. eGFR's persistently <60 mL/min signify possible Chronic Kidney Disease.    Lipid Panel  No results found for: CHOL, TRIG, HDL, CHOLHDL, VLDL, LDLCALC  CBC    Component Value Date/Time   WBC 11.0* 09/03/2011 1454   RBC 4.51 09/03/2011 1454   HGB 13.6 09/03/2011 1454   HCT 39.2 09/03/2011 1454   PLT 275 09/03/2011 1454   MCV 86.9 09/03/2011 1454   MCH 30.2 09/03/2011 1454   MCHC 34.7 09/03/2011 1454   RDW 12.7 09/03/2011 1454   LYMPHSABS 2.4 09/22/2010 1020   MONOABS 0.3 09/22/2010 1020   EOSABS 0.1 09/22/2010 1020   BASOSABS 0.0 09/22/2010 1020    Hgb A1C No results found for: HGBA1C       Assessment & Plan:    Preventative Health Maintenance:  Flu and Tdap today Pap smear obtained- will call you with the results Will test for trich, gonorrhea or chlamydia Will check CBC, CMET, A1C and Lipid profile today Encouraged her to work on diet and exercise  Muscle Strain of right lower back:  Try Ibuprofen 600 mg TID prn for pain A heating pad may be helpful Stretching exercises given   RTC in 1 year or sooner if needed

## 2015-01-02 NOTE — Addendum Note (Signed)
Addended by: Roena Malady on: 01/02/2015 04:59 PM   Modules accepted: Orders

## 2015-01-02 NOTE — Patient Instructions (Signed)

## 2015-01-03 ENCOUNTER — Ambulatory Visit: Payer: 59 | Admitting: Internal Medicine

## 2015-01-08 ENCOUNTER — Ambulatory Visit (INDEPENDENT_AMBULATORY_CARE_PROVIDER_SITE_OTHER): Payer: 59 | Admitting: Internal Medicine

## 2015-01-08 ENCOUNTER — Encounter: Payer: Self-pay | Admitting: Internal Medicine

## 2015-01-08 VITALS — BP 126/84 | HR 76 | Temp 98.4°F | Wt 250.5 lb

## 2015-01-08 DIAGNOSIS — J0100 Acute maxillary sinusitis, unspecified: Secondary | ICD-10-CM

## 2015-01-08 LAB — CYTOLOGY - PAP

## 2015-01-08 MED ORDER — AMOXICILLIN-POT CLAVULANATE 875-125 MG PO TABS: 1.0000 | ORAL_TABLET | Freq: Two times a day (BID) | ORAL | Status: DC

## 2015-01-08 NOTE — Progress Notes (Signed)
Pre visit review using our clinic review tool, if applicable. No additional management support is needed unless otherwise documented below in the visit note. 

## 2015-01-08 NOTE — Patient Instructions (Signed)

## 2015-01-08 NOTE — Progress Notes (Signed)
HPI Kathy Gamble is a 39 y.o. female presenting to the clinic today with c/o of runny nose, blood-tinged yellow mucous in mouth, and facial pain x 2 weeks. Several bloody noses after using Flonase x 1 week. She can feel the nasal mucous draining down her throat and into her mouth, and thinks that the blood-tinged mucous in mouth came from nose. No sore throat, cough, chest congestion, or chest pain. Hx of sinus infections.  Review of Systems    Past Medical History  Diagnosis Date  . Chicken pox   . GERD (gastroesophageal reflux disease)   . Frequent headaches   . Allergy     Family History  Problem Relation Age of Onset  . Stroke Mother   . Mental retardation Mother   . Cancer Maternal Grandmother   . Cancer Paternal Grandmother     Breast  . Heart disease Paternal Grandfather     History   Social History  . Marital Status: Married    Spouse Name: N/A    Number of Children: N/A  . Years of Education: N/A   Occupational History  . Not on file.   Social History Main Topics  . Smoking status: Never Smoker   . Smokeless tobacco: Not on file  . Alcohol Use: No  . Drug Use: No  . Sexual Activity: Yes    Birth Control/ Protection: None   Other Topics Concern  . Not on file   Social History Narrative    Allergies  Allergen Reactions  . Bee Venom Itching  . Latex Rash    The powder in the gloves causes rash     Constitutional: Positive headache and fatigue. Denies fever or abrupt weight changes.  HEENT:  Positive runny nose, bloody nose, eye pain, ear pain, pressure behind the eyes, facial pain, nasal congestion. Denies sore throat, eye redness, ringing in the ears, or wax buildup. Respiratory: Denies cough, difficulty breathing or shortness of breath.  Cardiovascular: Denies chest pain, chest tightness, palpitations or swelling in the hands or feet.   No other specific complaints in a complete review of systems (except as listed in HPI above).  Objective:  BP  126/84 mmHg  Pulse 76  Temp(Src) 98.4 F (36.9 C) (Oral)  Wt 250 lb 8 oz (113.626 kg)  SpO2 98%  LMP 12/15/2014  General: Appears her stated age, obese but well developed in NAD. HEENT: Head: normal shape and size, sinus tenderness noted - maxillary; Eyes: sclera white, no icterus, conjunctiva pink; Ears: Tm's gray and intact, normal light reflex; Nose: erthymatous mucosa and moist, septum midline; Throat/Mouth: + PND. Teeth present, mucosa pink and moist, no exudate noted, no lesions or ulcerations noted.  Neck: Neck supple, trachea midline. No masses, lumps or thyromegaly present.  Cardiovascular: Normal rate and rhythm. S1,S2 noted.  No murmur, rubs or gallops noted.  Pulmonary/Chest: Normal effort and positive vesicular breath sounds. No respiratory distress. No wheezes, rales or ronchi noted.      Assessment & Plan:   Acute bacterial sinusitis  Augmentin BID for 10 days Stop Flonase use due to nose bleeds. Ok to try saline nasal spray OTC Continue zyrtec  RTC as needed or if symptoms persist.

## 2015-01-09 ENCOUNTER — Telehealth: Payer: Self-pay

## 2015-01-09 NOTE — Telephone Encounter (Signed)
Her potassium is normal. OK to take Augmentin.

## 2015-01-09 NOTE — Telephone Encounter (Signed)
Left message on voicemail.

## 2015-01-09 NOTE — Telephone Encounter (Signed)
Pt left v/m wanting to know if med sent to pharmacy on 01/08/15 (Augmentin) had potassium in it. Pt wants to know if potassium is low. Spoke with Cala Bradford at [facility] [address]; she said augmentin has potassium clavulanate and potassium is just enough to hold med together. Is this OK to tell pt or what to tell pt.

## 2015-01-10 NOTE — Telephone Encounter (Signed)
Patient returned your call.  Please call patient back at [phone].

## 2015-01-11 LAB — CERVICOVAGINAL ANCILLARY ONLY
CANDIDA VAGINITIS: POSITIVE — AB
Herpes: NEGATIVE

## 2015-01-11 NOTE — Telephone Encounter (Signed)
Called pt about derm referral. She is aware of Reginas comments about augmentin

## 2015-01-11 NOTE — Progress Notes (Signed)
Quick Note:  Lab letter mailed  ______

## 2015-01-16 ENCOUNTER — Telehealth: Payer: Self-pay

## 2015-01-16 NOTE — Telephone Encounter (Signed)
We did not treat her because she was not complaining of any symptoms. If she has any vaginal discharge or itching we should treat her with Diflucan.

## 2015-01-16 NOTE — Telephone Encounter (Signed)
Pt left v/m requesting cb about lab results; noted letter mailed; since lab result positive for candida albicans came in does that change the result note to pt?

## 2015-01-16 NOTE — Telephone Encounter (Signed)
Pt is aware as instructed--pt states she was calling to find out about her A1C--not what is listed below--pt is okay and went over results--denies discharge or vaginal Sx

## 2015-01-25 ENCOUNTER — Encounter: Payer: Self-pay | Admitting: Internal Medicine

## 2015-01-25 ENCOUNTER — Ambulatory Visit (INDEPENDENT_AMBULATORY_CARE_PROVIDER_SITE_OTHER): Payer: 59 | Admitting: Internal Medicine

## 2015-01-25 VITALS — BP 118/84 | HR 56 | Temp 98.0°F | Wt 248.5 lb

## 2015-01-25 DIAGNOSIS — H6983 Other specified disorders of Eustachian tube, bilateral: Secondary | ICD-10-CM

## 2015-01-25 DIAGNOSIS — B373 Candidiasis of vulva and vagina: Secondary | ICD-10-CM

## 2015-01-25 DIAGNOSIS — M545 Low back pain, unspecified: Secondary | ICD-10-CM

## 2015-01-25 DIAGNOSIS — B3731 Acute candidiasis of vulva and vagina: Secondary | ICD-10-CM

## 2015-01-25 MED ORDER — FLUCONAZOLE 150 MG PO TABS: 150.0000 mg | ORAL_TABLET | Freq: Once | ORAL | Status: DC

## 2015-01-25 NOTE — Patient Instructions (Signed)
Barotitis Media Barotitis media is inflammation of your middle ear. This occurs when the auditory tube (eustachian tube) leading from the back of your nose (nasopharynx) to your eardrum is blocked. This blockage may result from a cold, environmental allergies, or an upper respiratory infection. Unresolved barotitis media may lead to damage or hearing loss (barotrauma), which may become permanent. HOME CARE INSTRUCTIONS   Use medicines as recommended by your health care provider. Over-the-counter medicines will help unblock the canal and can help during times of air travel.  Do not put anything into your ears to clean or unplug them. Eardrops will not be helpful.  Do not swim, dive, or fly until your health care provider says it is all right to do so. If these activities are necessary, chewing gum with frequent, forceful swallowing may help. It is also helpful to hold your nose and gently blow to pop your ears for equalizing pressure changes. This forces air into the eustachian tube.  Only take over-the-counter or prescription medicines for pain, discomfort, or fever as directed by your health care provider.  A decongestant may be helpful in decongesting the middle ear and make pressure equalization easier. SEEK MEDICAL CARE IF:  You experience a serious form of dizziness in which you feel as if the room is spinning and you feel nauseated (vertigo).  Your symptoms only involve one ear. SEEK IMMEDIATE MEDICAL CARE IF:   You develop a severe headache, dizziness, or severe ear pain.  You have bloody or pus-like drainage from your ears.  You develop a fever.  Your problems do not improve or become worse. MAKE SURE YOU:   Understand these instructions.  Will watch your condition.  Will get help right away if you are not doing well or get worse. Document Released: 11/27/2000 Document Revised: 09/20/2013 Document Reviewed: 06/27/2013 Exit[employer] Patient Information 2015 [employer]. This  information is not intended to replace advice given to you by your health care provider. Make sure you discuss any questions you have with your health care provider.  

## 2015-01-25 NOTE — Progress Notes (Signed)
Subjective:    Patient ID: Kathy Gamble, female    DOB: 06-09-1976, 39 y.o.   MRN: [id number]  HPI  Pt presents to the clinic today with c/o dysuria and low back pain. She reports this started 5 days ago. She denies fever, chills, nausea. She has not noticed any blood in her urine. She has a slight vaginal discharge but denies odor, itching or burning. She is not sure if her back her because she needs to have a BM but reports her last BM was this morning. She has not had any blood in her stool and has not had any abdominal pain. Her pap from 01/02/15 did show yeast on exam but she was not treated as she was asymptomatic. She has not tried anything OTC.  She also c/o bilateral ear pain. She reports this started at the end of January. It did not improve with the Augmentin that she was given for the sinus infection. She has felt off balance at times but not dizzy. She denies any trauma to the head or other upper respiratory symptoms. She has not tried anything OTC for this.  She also c/o left toenail thickening and discoloration. She noticed this 6 months ago. She has never had anything like this before. She denies any injury to the toenail. She does not get pedicures. She has not tried anything OTC.  Review of Systems      Past Medical History  Diagnosis Date  . Chicken pox   . GERD (gastroesophageal reflux disease)   . Frequent headaches   . Allergy       Allergies  Allergen Reactions  . Bee Venom Itching  . Latex Rash    The powder in the gloves causes rash    Family History  Problem Relation Age of Onset  . Stroke Mother   . Mental retardation Mother   . Cancer Maternal Grandmother   . Cancer Paternal Grandmother     Breast  . Heart disease Paternal Grandfather     History   Social History  . Marital Status: Married    Spouse Name: N/A  . Number of Children: N/A  . Years of Education: N/A   Occupational History  . Not on file.   Social History Main Topics  .  Smoking status: Never Smoker   . Smokeless tobacco: Not on file  . Alcohol Use: No  . Drug Use: No  . Sexual Activity: Yes    Birth Control/ Protection: None   Other Topics Concern  . Not on file   Social History Narrative     Constitutional: Denies fever, malaise, fatigue, headache or abrupt weight changes.  HEENT: Pt reports bilateral ear pain. Denies eye pain, eye redness,  ringing in the ears, wax buildup, runny nose, nasal congestion, bloody nose, or sore throat. Respiratory: Denies difficulty breathing, shortness of breath, cough or sputum production.   Cardiovascular: Denies chest pain, chest tightness, palpitations or swelling in the hands or feet.  Gastrointestinal: Denies abdominal pain, bloating, constipation, diarrhea or blood in the stool.  GU: Pt reports dysuria. Denies urgency, frequency, blood in urine, odor or discharge. Musculoskeletal: Pt reports low back pain. Denies decrease in range of motion, difficulty with gait, muscle pain or joint pain and swelling.  Skin: Pt reports toenail discoloration. Denies redness, rashes, lesions or ulcercations.  Neurological: Pt reports sense of imbalance. Denies dizziness, difficulty with memory, difficulty with speech or problems with balance and coordination.   No other specific complaints  in a complete review of systems (except as listed in HPI above).  Objective:   Physical Exam   BP 118/84 mmHg  Pulse 56  Temp(Src) 98 F (36.7 C) (Oral)  Wt 248 lb 8 oz (112.719 kg)  SpO2 99%  LMP 12/15/2014 Wt Readings from Last 3 Encounters:  01/25/15 248 lb 8 oz (112.719 kg)  01/08/15 250 lb 8 oz (113.626 kg)  01/02/15 247 lb (112.038 kg)    General: Appears her stated age, obese in NAD. Skin: Warm, dry and intact. Toenail fungus noted of left great toenail. HEENT: Head: normal shape and size; Eyes: sclera white, no icterus, conjunctiva pink; Ears: Tm's pink but intact, normal light reflex, + effusion bilaterally; Nose: mucosa  pink and moist, septum midline; Throat/Mouth: Teeth present, mucosa pink and moist, no exudate, lesions or ulcerations noted.  Cardiovascular: Bradycardic with normal rhythm. S1,S2 noted.  No murmur, rubs or gallops noted.  Pulmonary/Chest: Normal effort and positive vesicular breath sounds. No respiratory distress. No wheezes, rales or ronchi noted.  Abdomen: Soft and nontender. Normal bowel sounds, no bruits noted. No distention or masses noted. Liver, spleen and kidneys non palpable.No CVA tenderness. Pelvic: Normal female anatomy. Small amount of white vaginal discharge noted at opening. No odor noted. Musculoskeletal: Normal flexion and extension of the spine. No pain with palpation of the thoracic and lumbar spine. Strength 5/5 BLE.   BMET    Component Value Date/Time   NA 137 01/02/2015 1518   K 3.7 01/02/2015 1518   CL 104 01/02/2015 1518   CO2 29 01/02/2015 1518   GLUCOSE 92 01/02/2015 1518   BUN 16 01/02/2015 1518   CREATININE 0.70 01/02/2015 1518   CALCIUM 8.7 01/02/2015 1518   GFRNONAA >60 09/22/2010 1020   GFRAA  09/22/2010 1020    >60        The eGFR has been calculated using the MDRD equation. This calculation has not been validated in all clinical situations. eGFR's persistently <60 mL/min signify possible Chronic Kidney Disease.    Lipid Panel     Component Value Date/Time   CHOL 121 01/02/2015 1518   TRIG 76.0 01/02/2015 1518   HDL 40.30 01/02/2015 1518   CHOLHDL 3 01/02/2015 1518   VLDL 15.2 01/02/2015 1518   LDLCALC 66 01/02/2015 1518    CBC    Component Value Date/Time   WBC 7.5 01/02/2015 1518   RBC 4.38 01/02/2015 1518   HGB 13.3 01/02/2015 1518   HCT 38.7 01/02/2015 1518   PLT 295.0 01/02/2015 1518   MCV 88.4 01/02/2015 1518   MCH 30.2 09/03/2011 1454   MCHC 34.4 01/02/2015 1518   RDW 13.1 01/02/2015 1518   LYMPHSABS 2.4 09/22/2010 1020   MONOABS 0.3 09/22/2010 1020   EOSABS 0.1 09/22/2010 1020   BASOSABS 0.0 09/22/2010 1020    Hgb  A1C Lab Results  Component Value Date   HGBA1C 5.0 01/02/2015        Assessment & Plan:   Dysuria:  Urinalysis normal No need to send urine culture Wet prep: + yeast, no trich, no clue cells eRx for Diflucan 150 mg PO x 1  Low back pain:  ? MSK in origin although pain not reproduced on exam She thinks it may be related to constipation but reports she is having daily BM's Try some Ibuprofen Stretching may also be useful  Eustachian Tube Dysfunction:  No evidence of infection on exam, abx not indicated Try Flonase OTC daily x 1-2 weeks If no improvement will  consider low dose steroid taper  RTC as needed or if symptoms persist or worsen

## 2015-01-25 NOTE — Progress Notes (Signed)
Pre visit review using our clinic review tool, if applicable. No additional management support is needed unless otherwise documented below in the visit note. 

## 2015-01-28 LAB — POCT URINALYSIS DIPSTICK
Bilirubin, UA: NEGATIVE
Blood, UA: NEGATIVE
Glucose, UA: NEGATIVE
Ketones, UA: NEGATIVE
Leukocytes, UA: NEGATIVE
Nitrite, UA: NEGATIVE
Protein, UA: NEGATIVE
Spec Grav, UA: >=1.03
Urobilinogen, UA: NEGATIVE
pH, UA: 5.5

## 2015-01-28 NOTE — Addendum Note (Signed)
Addended by: Roena Malady on: 01/28/2015 10:25 AM   Modules accepted: Orders

## 2015-02-04 ENCOUNTER — Telehealth: Payer: Self-pay

## 2015-02-04 NOTE — Telephone Encounter (Signed)
PLEASE NOTE: All timestamps contained within this report are represented as [place] Standard Time. CONFIDENTIALTY NOTICE: This fax transmission is intended only for the addressee. It contains information that is legally privileged, confidential or otherwise protected from use or disclosure. If you are not the intended recipient, you are strictly prohibited from reviewing, disclosing, copying using or disseminating any of this information or taking any action in reliance on or regarding this information. If you have received this fax in error, please notify us immediately by telephone so that we can arrange for its return to [place]. Phone: [phone], Toll-Free: [phone], Fax: [phone] Page: 1 of 1 Call Id: [id number] [facility] Day - Client TELEPHONE ADVICE RECORD [facility] Patient Name: Kathy Gamble Gender: Female DOB: 06-05-1976 Age: 39 Y 2 M 23 D Return Phone Number: Address: City/State/Zip: [place] [facility] [place] Day - Client Client Site [facility] [place] - Day Physician Nicki Reaper Contact Type Call [name] Name same [name] Phone Number n/a Relationship To Patient Self Is this call to report lab results? No Call Type General Information Initial Comment caller states is calling to get her biopsy results General Information Type Call Transferred Nurse Assessment Guidelines Guideline Title Affirmed Question Affirmed Notes Nurse Date/Time (Eastern Time) Disp. Time Lamount Cohen Time) Disposition Final User 02/04/2015 11:03:00 AM General Information Provided Yes Alfonse Alpers After Care Instructions Given Call Event Type User Date / Time Description

## 2015-02-04 NOTE — Telephone Encounter (Signed)
Left message on voicemail.

## 2015-02-04 NOTE — Telephone Encounter (Signed)
Pt saw derm for her biopsy. She needs to call them.

## 2015-02-07 ENCOUNTER — Telehealth: Payer: Self-pay

## 2015-02-07 NOTE — Telephone Encounter (Signed)
Have her try Priolsec in the morning and at night. She will need to be evaluated if she thinks she is getting sick. Otherwise she can try Mucinex, Delsym and a saline nasal spray OTC

## 2015-02-07 NOTE — Telephone Encounter (Signed)
Pt has been taking omeprazole in morning and tums at night and pt said indigestion is not a lot better. Pt wants to know if should take med at different time of day or what to do. Pt also said 02/05/15 pt started with prod cough with yellow phlegm that is blood tinged. Pt has nose bleeds on and off. No fever, SOB or wheezing. [facility] [address]. Pt request cb.Please advise.

## 2015-02-08 ENCOUNTER — Encounter: Payer: Self-pay | Admitting: Internal Medicine

## 2015-02-08 ENCOUNTER — Ambulatory Visit (INDEPENDENT_AMBULATORY_CARE_PROVIDER_SITE_OTHER): Payer: 59 | Admitting: Internal Medicine

## 2015-02-08 ENCOUNTER — Telehealth: Payer: Self-pay | Admitting: Internal Medicine

## 2015-02-08 VITALS — BP 116/78 | HR 76 | Temp 97.8°F | Wt 248.0 lb

## 2015-02-08 DIAGNOSIS — R519 Headache, unspecified: Secondary | ICD-10-CM

## 2015-02-08 DIAGNOSIS — R51 Headache: Secondary | ICD-10-CM

## 2015-02-08 MED ORDER — FLUTICASONE PROPIONATE 50 MCG/ACT NA SUSP: 2.0000 | Freq: Every day | NASAL | Status: DC

## 2015-02-08 NOTE — Telephone Encounter (Signed)
PT IS AWARE AS INSTRUCTED

## 2015-02-08 NOTE — Telephone Encounter (Signed)
Patient Name: Kathy Gamble  DOB: 11-20-1976    Initial Comment [name] states she coughed up yellow mucus with blood.    Nurse Assessment  Nurse: Deatra James RN, Corrie Dandy Date/Time Lamount Cohen Time): 02/08/2015 9:54:32 AM  Confirm and document reason for call. If symptomatic, describe symptoms. ---Patient states she is coughing up yellow mucus with one streak of blood in it.  Has the patient traveled out of the country within the last 30 days? ---No  Does the patient require triage? ---Yes  Related visit to physician within the last 2 weeks? ---No  Does the PT have any chronic conditions? (i.e. diabetes, asthma, etc.) ---No  Did the patient indicate they were pregnant? ---No     Guidelines    Guideline Title Affirmed Question Affirmed Notes  Coughing Up Blood [1] Coughing up yellow or green sputum AND [2] present > 5 days    Final Disposition User   See Physician within 24 Hours [place], RN, [place]

## 2015-02-08 NOTE — Progress Notes (Signed)
HPI  Pt presents to the clinic today with c/o sinus headache, nasal congestion, ear fullness and cough. She reports this started 5 days ago. She is blowing yellow mucous out of her nose. She is coughing up yellow mucous. She denies fever, chills or body aches. She is taking zyrtec daily. She has not had sick contacts. She does not smoke.  Review of Systems    Past Medical History  Diagnosis Date  . Chicken pox   . GERD (gastroesophageal reflux disease)   . Frequent headaches   . Allergy     Family History  Problem Relation Age of Onset  . Stroke Mother   . Mental retardation Mother   . Cancer Maternal Grandmother   . Cancer Paternal Grandmother     Breast  . Heart disease Paternal Grandfather     History   Social History  . Marital Status: Married    Spouse Name: N/A  . Number of Children: N/A  . Years of Education: N/A   Occupational History  . Not on file.   Social History Main Topics  . Smoking status: Never Smoker   . Smokeless tobacco: Not on file  . Alcohol Use: No  . Drug Use: No  . Sexual Activity: Yes    Birth Control/ Protection: None   Other Topics Concern  . Not on file   Social History Narrative    Allergies  Allergen Reactions  . Bee Venom Itching  . Latex Rash    The powder in the gloves causes rash     Constitutional: Positive headache. Denies fatigue, fever or abrupt weight changes.  HEENT:  Positive nasal congestion. Denies eye redness, ear pain, ringing in the ears, wax buildup, runny nose or sore throat. Respiratory: Positive cough. Denies difficulty breathing or shortness of breath.  Cardiovascular: Denies chest pain, chest tightness, palpitations or swelling in the hands or feet.   No other specific complaints in a complete review of systems (except as listed in HPI above).  Objective:  BP 116/78 mmHg  Pulse 76  Temp(Src) 97.8 F (36.6 C) (Oral)  Wt 248 lb (112.492 kg)  SpO2 99%   General: Appears her stated age, obese in  NAD. HEENT: Head: normal shape and size, no sinus tenderness noted; Eyes: sclera white, no icterus, conjunctiva pink; Ears: Tm's gray and intact, normal light reflex; Nose: mucosa pink and moist, septum midline; Throat/Mouth: Teeth present, mucosa pink and moist, no exudate noted, no lesions or ulcerations noted.  Neck: No adenopathy noted. Cardiovascular: Normal rate and rhythm. S1,S2 noted.  No murmur, rubs or gallops noted.  Pulmonary/Chest: Normal effort and positive vesicular breath sounds. No respiratory distress. No wheezes, rales or ronchi noted.      Assessment & Plan:   Sinus Headache  Can use a Neti Pot which can be purchased from your local drug store. Flonase 2 sprays each nostril for 3 days and then as needed. Continue Zyrtec If symptoms persist, consider ENT referral  RTC as needed or if symptoms persist.

## 2015-02-08 NOTE — Telephone Encounter (Signed)
Disregard previous message. [facility] note was placed in wrong pt chart

## 2015-02-08 NOTE — Telephone Encounter (Signed)
Call pt, given her elevated blood pressure, eye dialation, and chest pain, needs to go to ER

## 2015-02-08 NOTE — Progress Notes (Signed)
Pre visit review using our clinic review tool, if applicable. No additional management support is needed unless otherwise documented below in the visit note. 

## 2015-02-08 NOTE — Telephone Encounter (Signed)
Pt has appt today at 3:45 pm to see Nicki Reaper NP.

## 2015-02-08 NOTE — Telephone Encounter (Deleted)
PLEASE NOTE: All timestamps contained within this report are represented as [place] Standard Time. CONFIDENTIALTY NOTICE: This fax transmission is intended only for the addressee. It contains information that is legally privileged, confidential or otherwise protected from use or disclosure. If you are not the intended recipient, you are strictly prohibited from reviewing, disclosing, copying using or disseminating any of this information or taking any action in reliance on or regarding this information. If you have received this fax in error, please notify us immediately by telephone so that we can arrange for its return to us. Phone: [phone], Toll-Free: [phone], Fax: [phone] Page: 1 of 2 Call Id: [id number] [facility] Night - Client TELEPHONE ADVICE RECORD [facility] Patient Name: Kathy Gamble Gender: Female DOB: 05/09/1972 Age: 39 Y 8 M 29 D Return Phone Number: [phone] (Primary), [phone] (Secondary) Address: [address] City/State/Zip: [place] [address] Client [facility] Night - Client Client Site [facility] - Night Physician Baity, Regina Contact Type Call Call Type Triage / Clinical [name] Name Kristie Greeson Relationship To Patient Friend Return Phone Number [phone] (Primary) Chief Complaint CHEST PAIN (>=21 years) - pain, pressure, heaviness or tightness Initial Comment [name] states she is having tingling in her rt arm and her BP is 167/111 she is also diabetic doesn't have any test strips. And her eyes are dilating and tightness rt side of chest PreDisposition Did not know what to do Nurse Assessment Nurse: Clarke, RN, Rebecca Date/Time (Eastern Time): 02/07/2015 5:57:58 PM Confirm and document reason for call. If symptomatic, describe symptoms. ---[name] states she is having tingling in her rt arm and her BP is 167/111 she is also diabetic  doesn't have any test strips. And her eyes are dilating and tightness rt side of chest Has the patient traveled out of the country within the last 30 days? ---Not Applicable Does the patient require triage? ---Yes Related visit to physician within the last 2 weeks? ---No Does the PT have any chronic conditions? (i.e. diabetes, asthma, etc.) ---Unknown Did the patient indicate they were pregnant? ---No Guidelines Guideline Title Affirmed Question Affirmed Notes Nurse Date/Time (Eastern Time) Chest Pain SEVERE difficulty breathing (e.g., struggling for each breath, speaks in single words) Clarke, RN, Rebecca 02/07/2015 6:01:01 PM Disp. Time (Eastern Time) Disposition Final User 02/07/2015 5:53:48 PM Send to Urgent Queue Holland, Catherine 02/07/2015 6:03:36 PM Send To RN Personal Clarke, RN, Rebecca 02/07/2015 6:27:13 PM 911 Follow Up Call Attempted Clarke, RN, Rebecca Reason: no answer. PLEASE NOTE: All timestamps contained within this report are represented as Eastern Standard Time. CONFIDENTIALTY NOTICE: This fax transmission is intended only for the addressee. It contains information that is legally privileged, confidential or otherwise protected from use or disclosure. If you are not the intended recipient, you are strictly prohibited from reviewing, disclosing, copying using or disseminating any of this information or taking any action in reliance on or regarding this information. If you have received this fax in error, please notify us immediately by telephone so that we can arrange for its return to us. Phone: [phone], Toll-Free: [phone], Fax: [phone] Page: 2 of 2 Call Id: [id number] 02/07/2015 6:01:18 PM Call EMS 911 Now Yes Clarke, RN, Rebecca [name] Understands: Yes Disagree/Comply: Comply Care Advice Given Per Guideline CALL EMS 911 NOW: Immediate medical attention is needed. You need to hang up and call 911 (or an ambulance). (Triager Discretion: I'll call you  back in a few minutes to be sure   you were able to reach them.) CARE ADVICE given per Chest Pain (Adult) guideline. After Care Instructions Given Call Event Type User Date / Time Description 

## 2015-02-08 NOTE — Patient Instructions (Signed)
Sinus Headache °A sinus headache is when your sinuses become clogged or swollen. Sinus headaches can range from mild to severe.  °CAUSES °A sinus headache can have different causes, such as: °· Colds. °· Sinus infections. °· Allergies. °SYMPTOMS  °Symptoms of a sinus headache may vary and can include: °· Headache. °· Pain or pressure in the face. °· Congested or runny nose. °· Fever. °· Inability to smell. °· Pain in upper teeth. °Weather changes can make symptoms worse. °TREATMENT  °The treatment of a sinus headache depends on the cause. °· Sinus pain caused by a sinus infection may be treated with antibiotic medicine. °· Sinus pain caused by allergies may be helped by allergy medicines (antihistamines) and medicated nasal sprays. °· Sinus pain caused by congestion may be helped by flushing the nose and sinuses with saline solution. °HOME CARE INSTRUCTIONS  °· If antibiotics are prescribed, take them as directed. Finish them even if you start to feel better. °· Only take over-the-counter or prescription medicines for pain, discomfort, or fever as directed by your caregiver. °· If you have congestion, use a nasal spray to help reduce pressure. °SEEK IMMEDIATE MEDICAL CARE IF: °· You have a fever. °· You have headaches more than once a week. °· You have sensitivity to light or sound. °· You have repeated nausea and vomiting. °· You have vision problems. °· You have sudden, severe pain in your face or head. °· You have a seizure. °· You are confused. °· Your sinus headaches do not get better after treatment. Many people think they have a sinus headache when they actually have migraines or tension headaches. °MAKE SURE YOU:  °· Understand these instructions. °· Will watch your condition. °· Will get help right away if you are not doing well or get worse. °Document Released: 01/07/2005 Document Revised: 02/22/2012 Document Reviewed: 02/28/2011 °ExitCare® Patient Information ©2015 [employer]. This information is not  intended to replace advice given to you by your health care provider. Make sure you discuss any questions you have with your health care provider. ° °

## 2015-02-11 ENCOUNTER — Other Ambulatory Visit: Payer: Self-pay | Admitting: *Deleted

## 2015-02-11 MED ORDER — CETIRIZINE HCL 10 MG PO TABS: 10.0000 mg | ORAL_TABLET | Freq: Every day | ORAL | Status: DC

## 2015-02-11 NOTE — Telephone Encounter (Signed)
Patient left a voicemail requesting a refill on her Cetirizine. Refill sent to pharmacy electronically. Patient notified by telephone.

## 2015-02-14 ENCOUNTER — Telehealth: Payer: Self-pay | Admitting: Internal Medicine

## 2015-02-14 NOTE — Telephone Encounter (Signed)
Patient Name: Kathy Gamble DOB: 07-Jan-1976 Initial Comment [name] states she has fatigue and a headache. Feels like she is in a fog and has been losing sleep. Nurse Assessment Nurse: Elijah Birk, RN, Lynda Date/Time (Eastern Time): 02/14/2015 1:44:25 PM Confirm and document reason for call. If symptomatic, describe symptoms. ---[name] states she has fatigue and a headache. Feels like she is in a fog/confused at times and has been losing sleep. Her back is bothering her as well. Had melanoma removed from her back. Has a cough, No fever. Has the patient traveled out of the country within the last 30 days? ---Not Applicable Does the patient require triage? ---Yes Related visit to physician within the last 2 weeks? ---No Does the PT have any chronic conditions? (i.e. diabetes, asthma, etc.) ---Yes List chronic conditions. ---melanoma Did the patient indicate they were pregnant? ---No Guidelines Guideline Title Affirmed Question Affirmed Notes Confusion - Delirium Headache or vomiting Final Disposition User Go to ED Now Elijah Birk, Charity fundraiser, [place]

## 2015-02-14 NOTE — Telephone Encounter (Signed)
Noted, routed to PCP and AS as FYI.  Please talk to me about this.

## 2015-03-01 ENCOUNTER — Telehealth: Payer: Self-pay | Admitting: Internal Medicine

## 2015-03-01 NOTE — Telephone Encounter (Signed)
Patient Name: Kathy Gamble DOB: 05/25/1976 Initial Comment [name] states wife has nosebleed and coughing, Flonase seems to irritating her nose. Patient sees the NP "Fredric Mare" Nurse Assessment Nurse: Loletta Specter, RN, Misty Stanley Date/Time Lamount Cohen Time): 03/01/2015 2:43:50 PM Confirm and document reason for call. If symptomatic, describe symptoms. ---[name] states his wife had been on Flonase x 1 week, last time she did Flonase was 2 days ago. Nosebleed this am, not a pouring nose bleed but irritation. Humidifier. Nose bleeds every 3-4 days since being on Flonase. Had been on Flonase before and had nosebleed. Has the patient traveled out of the country within the last 30 days? ---Not Applicable Does the patient require triage? ---Yes Related visit to physician within the last 2 weeks? ---No Does the PT have any chronic conditions? (i.e. diabetes, asthma, etc.) ---Yes List chronic conditions. ---headaches Did the patient indicate they were pregnant? ---No Guidelines Guideline Title Affirmed Question Affirmed Notes Nasal Allergies (Hay Fever) Lots of coughing Final Disposition User See Physician within 24 Hours Loletta Specter, RN, Misty Stanley Comments Appointment with Dr Patsy Lager, Saturday 3/19 @ 1100 at [facility], information and address of clinic given to caller.

## 2015-03-01 NOTE — Telephone Encounter (Signed)
Pt called and wanted to talk about some symptoms she was having.  I was trying to call her back to refer her to our [facility] nurse line but the phone number she gave does not work [phone]).  I tried the home number listed in Epic and her mobile number.  Could not reach patient on either number.

## 2015-03-01 NOTE — Telephone Encounter (Signed)
Make sure pt knows to hold flonase as well. Not documented as far as I see.

## 2015-03-01 NOTE — Telephone Encounter (Signed)
Patient advised.

## 2015-03-02 ENCOUNTER — Ambulatory Visit (INDEPENDENT_AMBULATORY_CARE_PROVIDER_SITE_OTHER): Payer: 59 | Admitting: Family Medicine

## 2015-03-02 ENCOUNTER — Encounter: Payer: Self-pay | Admitting: Family Medicine

## 2015-03-02 VITALS — BP 118/78 | HR 69 | Temp 98.2°F | Wt 245.0 lb

## 2015-03-02 DIAGNOSIS — J018 Other acute sinusitis: Secondary | ICD-10-CM

## 2015-03-02 DIAGNOSIS — Z91030 Bee allergy status: Secondary | ICD-10-CM

## 2015-03-02 DIAGNOSIS — Z91038 Other insect allergy status: Secondary | ICD-10-CM

## 2015-03-02 HISTORY — DX: Bee allergy status: Z91.030

## 2015-03-02 MED ORDER — AMOXICILLIN-POT CLAVULANATE 875-125 MG PO TABS: 1.0000 | ORAL_TABLET | Freq: Two times a day (BID) | ORAL | Status: DC

## 2015-03-02 MED ORDER — EPINEPHRINE 0.3 MG/0.3ML IJ SOAJ: 0.3000 mg | Freq: Once | INTRAMUSCULAR | Status: DC

## 2015-03-02 NOTE — Progress Notes (Signed)
Pre visit review using our clinic review tool, if applicable. No additional management support is needed unless otherwise documented below in the visit note. 

## 2015-03-02 NOTE — Progress Notes (Signed)
[facility] [address] [place], [place] [address] Phone: [phone]  Patient ID: Kathy Gamble MRN: [id number], DOB: [birth year], 39 y.o. Date of Encounter: 03/02/2015  Primary Physician:  Nicki Reaper, NP   Chief Complaint  Patient presents with  . Cough    poductive yellow/blood tinged sputum x 1 day--  . Epistaxis    has happened 3-4 times at night she has awaken with blood coming from nose--pt has been using Flonase--stopped when Sx started and changed to nasal saline  . Medication Refill    wants Rx for epi pen for Bee allergy   Subjective:   History of Present Illness:  Kathy Gamble is a 39 y.o. pleasant patient who presents with the following:  Had a bloody nose overnight and a headache. Also coughing some.  Coughing - headaches for years.  Had some yellow and bright red coming out - started with her doing Flonase.   The patient has some ongoing allergies, and she also has intermittent headaches on a daily basis per her report.  She is currently taking Topamax.  She also had some epistaxis and some discolored mucus coming from her sinus cavity.  She is not sure if she's been having fever at all.  She also wants to have a refill for an EpiPen given that she is allergic to bees.  Wt Readings from Last 3 Encounters:  03/02/15 245 lb (111.131 kg)  02/08/15 248 lb (112.492 kg)  01/25/15 248 lb 8 oz (112.719 kg)     The PMH, PSH, Social History, Family History, Medications, and allergies have been reviewed in [facility], and have been updated if relevant.  ROS: GEN: Acute illness details above GI: Tolerating PO intake GU: maintaining adequate hydration and urination Pulm: No SOB Interactive and getting along well at home.  Otherwise, ROS is as per the HPI.   Objective:   Physical Examination: BP 118/78 mmHg  Pulse 69  Temp(Src) 98.2 F (36.8 C) (Oral)  Wt 245 lb (111.131 kg)  SpO2 99%  LMP 03/02/2015   Gen:  WDWN, NAD; alert,appropriate and cooperative throughout exam  HEENT: Normocephalic and atraumatic. Throat clear, w/o exudate, no LAD, R TM clear, L TM - good landmarks, No fluid present. rhinnorhea.  Left frontal and maxillary sinuses: Tender, frontal Right frontal and maxillary sinuses: Tender, frontal  Neck: No ant or post LAD CV: RRR, No M/G/R Pulm: Breathing comfortably in no resp distress. no w/c/r Abd: S,NT,ND,+BS Extr: no c/c/e Psych: full affect, pleasant   Laboratory and Imaging Data:  Assessment & Plan:   Other acute sinusitis  Bee sting allergy  Acute sinusitis: ABX as below.   Reviewed symptomatic care as well as ABX in this case.    Follow-up: No Follow-up on file.  New Prescriptions   AMOXICILLIN-CLAVULANATE (AUGMENTIN) 875-125 MG PER TABLET    Take 1 tablet by mouth 2 (two) times daily.   EPINEPHRINE 0.3 MG/0.3 ML IJ SOAJ INJECTION    Inject 0.3 mLs (0.3 mg total) into the muscle once.   No orders of the defined types were placed in this encounter.    Signed,  Elpidio Galea. Minna Dumire, MD   Patient's Medications  New Prescriptions   AMOXICILLIN-CLAVULANATE (AUGMENTIN) 875-125 MG PER TABLET    Take 1 tablet by mouth 2 (two) times daily.   EPINEPHRINE 0.3 MG/0.3 ML IJ SOAJ INJECTION    Inject 0.3 mLs (0.3 mg total) into the muscle once.  Previous Medications  ASCORBIC ACID (VITAMIN C) POWD    Take 1,000 mg by mouth daily.   CETIRIZINE (ZYRTEC) 10 MG TABLET    Take 1 tablet (10 mg total) by mouth daily.   FLUTICASONE (FLONASE) 50 MCG/ACT NASAL SPRAY    Place 2 sprays into both nostrils daily.   OMEPRAZOLE (PRILOSEC) 40 MG CAPSULE    Take 1 capsule (40 mg total) by mouth daily.   TOPIRAMATE (TOPAMAX) 100 MG TABLET    TAKE ONE TABLET BY MOUTH AT BEDTIME  Modified Medications   No medications on file  Discontinued Medications   GARCINIA CAMBOGIA-CHROMIUM 500-200 MG-MCG TABS    Take 2 capsules by mouth daily.

## 2015-03-06 ENCOUNTER — Telehealth: Payer: Self-pay

## 2015-03-06 ENCOUNTER — Ambulatory Visit: Payer: 59 | Admitting: Internal Medicine

## 2015-03-06 NOTE — Telephone Encounter (Signed)
Pt hit her closed fist against wall 3 days ago and rt middle and index finger are swollen and painful; pt has been using ice. Pt has appt 03/06/15 at 3PM with Nicki Reaper NP.

## 2015-03-07 ENCOUNTER — Ambulatory Visit (INDEPENDENT_AMBULATORY_CARE_PROVIDER_SITE_OTHER)
Admission: RE | Admit: 2015-03-07 | Discharge: 2015-03-07 | Disposition: A | Discharge status: Home / Self Care | Payer: 59 | Source: Ambulatory Visit | Attending: Internal Medicine | Admitting: Internal Medicine

## 2015-03-07 ENCOUNTER — Ambulatory Visit (INDEPENDENT_AMBULATORY_CARE_PROVIDER_SITE_OTHER): Payer: 59 | Admitting: Internal Medicine

## 2015-03-07 ENCOUNTER — Encounter: Payer: Self-pay | Admitting: Internal Medicine

## 2015-03-07 VITALS — BP 114/78 | HR 70 | Temp 98.5°F | Wt 247.0 lb

## 2015-03-07 DIAGNOSIS — M79644 Pain in right finger(s): Secondary | ICD-10-CM

## 2015-03-07 DIAGNOSIS — S6991XA Unspecified injury of right wrist, hand and finger(s), initial encounter: Secondary | ICD-10-CM

## 2015-03-07 NOTE — Patient Instructions (Signed)
RICE: Routine Care for Injuries The routine care of many injuries includes Rest, Ice, Compression, and Elevation (RICE). HOME CARE INSTRUCTIONS  Rest is needed to allow your body to heal. Routine activities can usually be resumed when comfortable. Injured tendons and bones can take up to 6 weeks to heal. Tendons are the cord-like structures that attach muscle to bone.  Ice following an injury helps keep the swelling down and reduces pain.  Put ice in a plastic bag.  Place a towel between your skin and the bag.  Leave the ice on for 15-20 minutes, 3-4 times a day, or as directed by your health care provider. Do this while awake, for the first 24 to 48 hours. After that, continue as directed by your caregiver.  Compression helps keep swelling down. It also gives support and helps with discomfort. If an elastic bandage has been applied, it should be removed and reapplied every 3 to 4 hours. It should not be applied tightly, but firmly enough to keep swelling down. Watch fingers or toes for swelling, bluish discoloration, coldness, numbness, or excessive pain. If any of these problems occur, remove the bandage and reapply loosely. Contact your caregiver if these problems continue.  Elevation helps reduce swelling and decreases pain. With extremities, such as the arms, hands, legs, and feet, the injured area should be placed near or above the level of the heart, if possible. SEEK IMMEDIATE MEDICAL CARE IF:  You have persistent pain and swelling.  You develop redness, numbness, or unexpected weakness.  Your symptoms are getting worse rather than improving after several days. These symptoms may indicate that further evaluation or further X-rays are needed. Sometimes, X-rays may not show a small broken bone (fracture) until 1 week or 10 days later. Make a follow-up appointment with your caregiver. Ask when your X-ray results will be ready. Make sure you get your X-ray results. Document Released:  03/14/2001 Document Revised: 12/05/2013 Document Reviewed: 05/01/2011 [employer] Patient Information 2015 [employer]. This information is not intended to replace advice given to you by your health care provider. Make sure you discuss any questions you have with your health care provider.  

## 2015-03-07 NOTE — Progress Notes (Signed)
Pre visit review using our clinic review tool, if applicable. No additional management support is needed unless otherwise documented below in the visit note. 

## 2015-03-07 NOTE — Progress Notes (Signed)
Subjective:    Patient ID: Kathy Gamble, female    DOB: 07-18-1976, 39 y.o.   MRN: [id number]  HPI  Pt presents to the clinic today with c/o right hand pain. She reports 3 punched a wall about 4 days ago. Most of the pain in in her middle finger but she has had some pain in her ring finger on her right hand. She has noticed some increased swelling as well. The pain is worse with movement. She has been icing it at night and taking Ibuprofen 200 mg once a day. She feels like it is getting worse instead of better.   Review of Systems      Past Medical History  Diagnosis Date  . Chicken pox   . GERD (gastroesophageal reflux disease)   . Frequent headaches   . Allergy   . Bee sting allergy 03/02/2015    Current Outpatient Prescriptions  Medication Sig Dispense Refill  . amoxicillin-clavulanate (AUGMENTIN) 875-125 MG per tablet Take 1 tablet by mouth 2 (two) times daily. 20 tablet 0  . cetirizine (ZYRTEC) 10 MG tablet Take 1 tablet (10 mg total) by mouth daily. 90 tablet 1  . fluticasone (FLONASE) 50 MCG/ACT nasal spray Place 2 sprays into both nostrils daily. 16 g 6  . omeprazole (PRILOSEC) 40 MG capsule Take 1 capsule (40 mg total) by mouth daily. 30 capsule 3  . topiramate (TOPAMAX) 100 MG tablet TAKE ONE TABLET BY MOUTH AT BEDTIME    . Ascorbic Acid (VITAMIN C) POWD Take 1,000 mg by mouth daily.     No current facility-administered medications for this visit.    Allergies  Allergen Reactions  . Bee Venom Itching  . Latex Rash    The powder in the gloves causes rash    Family History  Problem Relation Age of Onset  . Stroke Mother   . Mental retardation Mother   . Cancer Maternal Grandmother   . Cancer Paternal Grandmother     Breast  . Heart disease Paternal Grandfather     History   Social History  . Marital Status: Married    Spouse Name: N/A  . Number of Children: N/A  . Years of Education: N/A   Occupational History  . Not on file.   Social History  Main Topics  . Smoking status: Never Smoker   . Smokeless tobacco: Not on file  . Alcohol Use: No  . Drug Use: No  . Sexual Activity: Yes    Birth Control/ Protection: None   Other Topics Concern  . Not on file   Social History Narrative     Constitutional: Denies fever, malaise, fatigue, headache or abrupt weight changes.  Respiratory: Denies difficulty breathing, shortness of breath, cough or sputum production.   Cardiovascular: Denies chest pain, chest tightness, palpitations or swelling in the hands or feet.  Musculoskeletal: Pt reports pain in right hand. Denies difficulty with gait, muscle pain.  Skin: Denies redness, rashes, lesions or ulcercations.   No other specific complaints in a complete review of systems (except as listed in HPI above).  Objective:   Physical Exam  BP 114/78 mmHg  Pulse 70  Temp(Src) 98.5 F (36.9 C) (Oral)  Wt 247 lb (112.038 kg)  SpO2 98%  LMP 03/02/2015 Wt Readings from Last 3 Encounters:  03/07/15 247 lb (112.038 kg)  03/02/15 245 lb (111.131 kg)  02/08/15 248 lb (112.492 kg)    General: Appears her stated age, obese in NAD. Cardiovascular: Normal rate and  rhythm. S1,S2 noted.  No murmur, rubs or gallops noted.  Pulmonary/Chest: Normal effort and positive vesicular breath sounds. No respiratory distress. No wheezes, rales or ronchi noted.  Musculoskeletal: Decreased flexion of middle, ring finger on right hand. Joint swelling noted on right middle finger. Mild bruising. Neurological: Alert and oriented. Sensation intact to bilateral hands.  BMET    Component Value Date/Time   NA 137 01/02/2015 1518   K 3.7 01/02/2015 1518   CL 104 01/02/2015 1518   CO2 29 01/02/2015 1518   GLUCOSE 92 01/02/2015 1518   BUN 16 01/02/2015 1518   CREATININE 0.70 01/02/2015 1518   CALCIUM 8.7 01/02/2015 1518   GFRNONAA >60 09/22/2010 1020   GFRAA  09/22/2010 1020    >60        The eGFR has been calculated using the MDRD equation. This  calculation has not been validated in all clinical situations. eGFR's persistently <60 mL/min signify possible Chronic Kidney Disease.    Lipid Panel     Component Value Date/Time   CHOL 121 01/02/2015 1518   TRIG 76.0 01/02/2015 1518   HDL 40.30 01/02/2015 1518   CHOLHDL 3 01/02/2015 1518   VLDL 15.2 01/02/2015 1518   LDLCALC 66 01/02/2015 1518    CBC    Component Value Date/Time   WBC 7.5 01/02/2015 1518   RBC 4.38 01/02/2015 1518   HGB 13.3 01/02/2015 1518   HCT 38.7 01/02/2015 1518   PLT 295.0 01/02/2015 1518   MCV 88.4 01/02/2015 1518   MCH 30.2 09/03/2011 1454   MCHC 34.4 01/02/2015 1518   RDW 13.1 01/02/2015 1518   LYMPHSABS 2.4 09/22/2010 1020   MONOABS 0.3 09/22/2010 1020   EOSABS 0.1 09/22/2010 1020   BASOSABS 0.0 09/22/2010 1020    Hgb A1C Lab Results  Component Value Date   HGBA1C 5.0 01/02/2015         Assessment & Plan:   Right hand injury:  Will obtain xray of right middle finger to r/o fracture Continue Ice and Ibuprofen for now  Will follow up with you after the xray, RTC as needed

## 2015-03-20 ENCOUNTER — Telehealth: Payer: Self-pay | Admitting: Internal Medicine

## 2015-03-20 ENCOUNTER — Ambulatory Visit (INDEPENDENT_AMBULATORY_CARE_PROVIDER_SITE_OTHER): Payer: 59 | Admitting: Primary Care

## 2015-03-20 ENCOUNTER — Encounter: Payer: Self-pay | Admitting: Primary Care

## 2015-03-20 VITALS — BP 112/72 | HR 92 | Temp 97.4°F | Ht 67.75 in | Wt 248.8 lb

## 2015-03-20 DIAGNOSIS — R05 Cough: Secondary | ICD-10-CM | POA: Diagnosis not present

## 2015-03-20 DIAGNOSIS — R059 Cough, unspecified: Secondary | ICD-10-CM

## 2015-03-20 DIAGNOSIS — R040 Epistaxis: Secondary | ICD-10-CM

## 2015-03-20 MED ORDER — BENZONATATE 100 MG PO CAPS: 100.0000 mg | ORAL_CAPSULE | Freq: Three times a day (TID) | ORAL | Status: DC | PRN

## 2015-03-20 NOTE — Patient Instructions (Addendum)
Stop using the nasal spray as it is likely causing irritation to your nose. Use the Tessalon Pearls for cough three times daily as needed. Start taking Zyrtec or Claritin that you can find over the counter for your allergy symptoms and to help decrease your nasal drainage. Try using your humidifier to help moisten the air. Give [place] a call if you have any questions!  Allergic Rhinitis Allergic rhinitis is when the mucous membranes in the nose respond to allergens. Allergens are particles in the air that cause your body to have an allergic reaction. This causes you to release allergic antibodies. Through a chain of events, these eventually cause you to release histamine into the blood stream. Although meant to protect the body, it is this release of histamine that causes your discomfort, such as frequent sneezing, congestion, and an itchy, runny nose.  CAUSES  Seasonal allergic rhinitis (hay fever) is caused by pollen allergens that may come from grasses, trees, and weeds. Year-round allergic rhinitis (perennial allergic rhinitis) is caused by allergens such as house dust mites, pet dander, and mold spores.  SYMPTOMS   Nasal stuffiness (congestion).  Itchy, runny nose with sneezing and tearing of the eyes. DIAGNOSIS  Your health care provider can help you determine the allergen or allergens that trigger your symptoms. If you and your health care provider are unable to determine the allergen, skin or blood testing may be used. TREATMENT  Allergic rhinitis does not have a cure, but it can be controlled by:  Medicines and allergy shots (immunotherapy).  Avoiding the allergen. Hay fever may often be treated with antihistamines in pill or nasal spray forms. Antihistamines block the effects of histamine. There are over-the-counter medicines that may help with nasal congestion and swelling around the eyes. Check with your health care provider before taking or giving this medicine.  If avoiding the  allergen or the medicine prescribed do not work, there are many new medicines your health care provider can prescribe. Stronger medicine may be used if initial measures are ineffective. Desensitizing injections can be used if medicine and avoidance does not work. Desensitization is when a patient is given ongoing shots until the body becomes less sensitive to the allergen. Make sure you follow up with your health care provider if problems continue. HOME CARE INSTRUCTIONS It is not possible to completely avoid allergens, but you can reduce your symptoms by taking steps to limit your exposure to them. It helps to know exactly what you are allergic to so that you can avoid your specific triggers. SEEK MEDICAL CARE IF:   You have a fever.  You develop a cough that does not stop easily (persistent).  You have shortness of breath.  You start wheezing.  Symptoms interfere with normal daily activities. Document Released: 08/25/2001 Document Revised: 12/05/2013 Document Reviewed: 08/07/2013 [facility] Patient Information 2015 [place], [place]. This information is not intended to replace advice given to you by your health care provider. Make sure you discuss any questions you have with your health care provider.

## 2015-03-20 NOTE — Telephone Encounter (Signed)
Pt has appt scheduled today at 1:45 pm with Mayra Reel NP.

## 2015-03-20 NOTE — Progress Notes (Signed)
Pre visit review using our clinic review tool, if applicable. No additional management support is needed unless otherwise documented below in the visit note. 

## 2015-03-20 NOTE — Progress Notes (Signed)
Subjective:    Patient ID: Kathy Gamble, female    DOB: 1976/08/17, 39 y.o.   MRN: [id number]  HPI  Kathy Gamble is a 39 year old female who presents today with a chief complaint of nasal congestion with intermittent nasal discharge (occasionally blood tinged/yellow), and intermittent productive cough (thin yellow and clear mucous) that started today. She was seen on 3/19 for same and was prescribed Augmentin for a bacterial sinusitis. She finished her course of antibiotics as prescribed and began to feel better. She's mainly concerned with the nasal drainage and blood tinged mucous. She's been using saline nasal spray several times daily to her nose which is causing irritation. She's been using OTC cough drops and chloraseptic spray which has helped with her cough. Denies fevers, chills, nausea.  Review of Systems  Constitutional: Negative for fever and chills.  HENT: Positive for ear pain, rhinorrhea and sinus pressure. Negative for sore throat.   Respiratory: Positive for cough. Negative for shortness of breath.   Cardiovascular: Negative for chest pain.  Gastrointestinal: Negative for nausea and vomiting.  Musculoskeletal: Negative for myalgias.       Past Medical History  Diagnosis Date  . Chicken pox   . GERD (gastroesophageal reflux disease)   . Frequent headaches   . Allergy   . Bee sting allergy 03/02/2015    History   Social History  . Marital Status: Married    Spouse Name: N/A  . Number of Children: N/A  . Years of Education: N/A   Occupational History  . Not on file.   Social History Main Topics  . Smoking status: Never Smoker   . Smokeless tobacco: Not on file  . Alcohol Use: No  . Drug Use: No  . Sexual Activity: Yes    Birth Control/ Protection: None   Other Topics Concern  . Not on file   Social History Narrative    Past Surgical History  Procedure Laterality Date  . Tonsillectomy    . Adenoidectomy      Family History  Problem Relation Age  of Onset  . Stroke Mother   . Mental retardation Mother   . Cancer Maternal Grandmother   . Cancer Paternal Grandmother     Breast  . Heart disease Paternal Grandfather     Allergies  Allergen Reactions  . Bee Venom Itching  . Latex Rash    The powder in the gloves causes rash    Current Outpatient Prescriptions on File Prior to Visit  Medication Sig Dispense Refill  . cetirizine (ZYRTEC) 10 MG tablet Take 1 tablet (10 mg total) by mouth daily. 90 tablet 1  . omeprazole (PRILOSEC) 40 MG capsule Take 1 capsule (40 mg total) by mouth daily. 30 capsule 3  . topiramate (TOPAMAX) 100 MG tablet TAKE ONE TABLET BY MOUTH AT BEDTIME    . fluticasone (FLONASE) 50 MCG/ACT nasal spray Place 2 sprays into both nostrils daily. (Patient not taking: Reported on 03/20/2015) 16 g 6   No current facility-administered medications on file prior to visit.    BP 112/72 mmHg  Pulse 92  Temp(Src) 97.4 F (36.3 C) (Oral)  Ht 5' 7.75" (1.721 m)  Wt 248 lb 12.8 oz (112.855 kg)  BMI 38.10 kg/m2  SpO2 98%  LMP 03/17/2015    Objective:   Physical Exam  Constitutional: She is oriented to person, place, and time. She appears well-developed.  HENT:  Head: Normocephalic.  Right Ear: External ear normal.  Left Ear: External  ear normal.  Nose: Nose normal.  Mouth/Throat: Oropharynx is clear and moist.  Eyes: Conjunctivae are normal. Pupils are equal, round, and reactive to light.  Neck: Neck supple.  Cardiovascular: Normal rate and regular rhythm.   Pulmonary/Chest: Effort normal and breath sounds normal.  Lymphadenopathy:    She has no cervical adenopathy.  Neurological: She is alert and oriented to person, place, and time.  Skin: Skin is warm and dry.          Assessment & Plan:  Allergic rhinitis/nasal irritation:  She does not show systemic signs for an acute infection and appeared to be well during exam. Suspect the nasal irritation is caused by her numerous doses of saline nasal  spray. Encouraged her to try a systemic tablet such as Claritin or Zyrtec to help with her allergy symptoms. Tessalon Pearls prescribed PRN for cough.

## 2015-03-20 NOTE — Telephone Encounter (Signed)
Pt was just seen today :45pm and also wanted to let Mayra Reel know that her ankles are swollen and would like to know what she should do regarding this.

## 2015-03-20 NOTE — Telephone Encounter (Signed)
[facility] Day - Client TELEPHONE ADVICE RECORD [facility]  Patient Name: Kathy Gamble  DOB: 1976/03/17    Initial Comment Record 1 of 2: [name] states she she is blowing her nose and it has a lot of blood. [name] states she also spit up some blood today. [name] states she has been using saline nasel spray 3 to 4 times a day for her allergies   Nurse Assessment  Nurse: Roderic Ovens, RN, Amy Date/Time (Eastern Time): 03/20/2015 12:10:55 PM  Confirm and document reason for call. If symptomatic, describe symptoms. ---[name] States she has been blowing her nose and she has been getting a lot of blood in it. She has been using saline in it. She has not been able to use the Flonase that she was recommended to use. She has not had any fever. She is having cough that is constant. She has been using numbness spray. She has been taking Zyrtec as well.  Has the patient traveled out of the country within the last 30 days? ---Not Applicable  Does the patient require triage? ---Yes  Related visit to physician within the last 2 weeks? ---Yes  Does the PT have any chronic conditions? (i.e. diabetes, asthma, etc.) ---Yes  List chronic conditions. ---headaches  Did the patient indicate they were pregnant? ---No     Guidelines    Guideline Title Affirmed Question Affirmed Notes  Nasal Allergies (Hay Fever) [1] Lots of yellow or green discharge from nose AND [2] present > 3 days    Final Disposition User   See Physician within 24 Hours [place], [place], Amy    Comments  appt scheduled this afternoon, patient is aware

## 2015-03-21 NOTE — Telephone Encounter (Signed)
Please notify Kathy Gamble that she can try elevating her legs on pillows to assist with decreasing swelling. I hope she feels better soon!  Thanks!

## 2015-03-21 NOTE — Telephone Encounter (Signed)
Pt returned your call. She says her ankles are still swollen.  She also says that her nose is bleeding.  She used the nasal spray (saline) last night. He nose started bleeding this morning.   She has also changed the filters in her humidifier.  Pt request cb.

## 2015-03-21 NOTE — Telephone Encounter (Signed)
Referral made to ENT.

## 2015-03-21 NOTE — Telephone Encounter (Signed)
Left voicemail for patient to call back. 

## 2015-03-21 NOTE — Telephone Encounter (Signed)
Please advise. Thank you

## 2015-03-21 NOTE — Telephone Encounter (Signed)
Please call Kathy Gamble and let her know that I can refer her to ENT for her nasal drainage/bleeding since it's not resolved despite several attempts at treatement. Is she okay with this? If so, then I will so do.  Thank you!

## 2015-03-21 NOTE — Telephone Encounter (Signed)
Called and spoken to patient. Notified patient of Kate's comments. Patient agree to the referral to ENT.

## 2015-03-25 ENCOUNTER — Telehealth: Payer: Self-pay | Admitting: Internal Medicine

## 2015-03-25 ENCOUNTER — Ambulatory Visit: Payer: Self-pay | Admitting: Internal Medicine

## 2015-03-25 NOTE — Telephone Encounter (Signed)
Pt has appt today at 4 pm with Regina Baity NP. 

## 2015-03-25 NOTE — Telephone Encounter (Signed)
Patient Name: Kathy Gamble DOB: November 23, 1976 Initial Comment [name] states she has feet and ankle swelling and pain, wants to know if ok to walk on or if she should stay off her feet Nurse Assessment Nurse: Yetta Barre, RN, Miranda Date/Time (Eastern Time): 03/25/2015 10:28:32 AM Confirm and document reason for call. If symptomatic, describe symptoms. ---[name] states she has been having pain and swelling in her feet and ankles for 1 week. Has the patient traveled out of the country within the last 30 days? ---No Does the patient require triage? ---Yes Related visit to physician within the last 2 weeks? ---Yes Does the PT have any chronic conditions? (i.e. diabetes, asthma, etc.) ---Yes List chronic conditions. ---Headaches, Allergies Did the patient indicate they were pregnant? ---No Guidelines Guideline Title Affirmed Question Affirmed Notes Leg Swelling and Edema [1] MILD swelling of both ankles (i.e., pedal edema) AND [2] new onset or worsening Final Disposition User See PCP When Office is Open (within 3 days) Yetta Barre, RN, Miranda Comments Appt scheduled for 4pm today with [employer]

## 2015-03-28 ENCOUNTER — Emergency Department
Admit: 2015-03-28 | Disposition: A | Discharge status: Home / Self Care | Payer: Self-pay | Admitting: Emergency Medicine

## 2015-03-29 ENCOUNTER — Telehealth: Payer: Self-pay

## 2015-03-29 NOTE — Telephone Encounter (Signed)
ED appropriate.

## 2015-03-29 NOTE — Telephone Encounter (Signed)
PLEASE NOTE: All timestamps contained within this report are represented as [place] Standard Time. CONFIDENTIALTY NOTICE: This fax transmission is intended only for the addressee. It contains information that is legally privileged, confidential or otherwise protected from use or disclosure. If you are not the intended recipient, you are strictly prohibited from reviewing, disclosing, copying using or disseminating any of this information or taking any action in reliance on or regarding this information. If you have received this fax in error, please notify us immediately by telephone so that we can arrange for its return to [place]. Phone: [phone], Toll-Free: [phone], Fax: [phone] Page: 1 of 2 Call Id: [id number] [facility] Night - Client TELEPHONE ADVICE RECORD [facility] Medical Call Center Patient Name: Kathy Gamble Gender: Female DOB: 22-Nov-1976 Age: 39 Y 4 M 16 D Return Phone Number: [phone] (Primary), [phone] (Secondary) Address: [place] [address] Client [facility] Night - Client Client Site [facility] - Night Physician Nicki Reaper Contact Type Call Call Type Triage / Clinical [name] Name Jameson Gharib Relationship To Patient Self Return Phone Number [phone] (Primary) Chief Complaint BLEEDING - Uncontrollable (not vaginal) Initial Comment [name] states his wife had her nose cauterized yesterday. This evening her nose started pouring blood. PreDisposition Go to ED Nurse Assessment Nurse: Ladona Ridgel, RN, Sunny Schlein Date/Time Lamount Cohen Time): 03/28/2015 9:42:49 PM Confirm and document reason for call. If symptomatic, describe symptoms. ---PTS NOSE IS BLEEDING AND IT STARTED 10 - 15 MIN AGO - NOSE WAS CAUTERIZED YESTERDAY Has the patient traveled out of the country within the last 30 days? ---No Does the patient require triage? ---Yes Related visit to physician within the last 2  weeks? ---Yes Does the PT have any chronic conditions? (i.e. diabetes, asthma, etc.) ---No Did the patient indicate they were pregnant? ---No Guidelines Guideline Title Affirmed Question Affirmed Notes Nurse Date/Time (Eastern Time) Nosebleed Dizziness or lightheadedness Ladona Ridgel, RN, [facility] 03/28/2015 9:43:40 PM Disp. Time Lamount Cohen Time) Disposition Final User 03/28/2015 9:40:22 PM Send to Urgent Valere Dross, Amy 03/28/2015 9:46:02 PM Go to ED Now Yes Ladona Ridgel, RN, Lavena Stanford Understands: Yes Disagree/Comply: Comply PLEASE NOTE: All timestamps contained within this report are represented as [place] Standard Time. CONFIDENTIALTY NOTICE: This fax transmission is intended only for the addressee. It contains information that is legally privileged, confidential or otherwise protected from use or disclosure. If you are not the intended recipient, you are strictly prohibited from reviewing, disclosing, copying using or disseminating any of this information or taking any action in reliance on or regarding this information. If you have received this fax in error, please notify us immediately by telephone so that we can arrange for its return to [place]. Phone: [phone], Toll-Free: [phone], Fax: [phone] Page: 2 of 2 Call Id: [id number] Care Advice Given Per Guideline GO TO ED NOW: You need to be seen in the Emergency Department. Go to the ER at _________[facility] Hospital. Leave now. Drive carefully. APPLY PRESSURE: Continue to apply direct pressure to the lower nose until seen. DRIVING: Another adult should drive. * It is also a good idea to bring the pill bottles too. This will help the doctor to make certain you are taking the right medicines and the right dose. After Care Instructions Given Call Event Type User Date / Time Description Referrals [facility] - ED

## 2015-04-03 ENCOUNTER — Telehealth: Payer: Self-pay | Admitting: *Deleted

## 2015-04-03 NOTE — Telephone Encounter (Signed)
Pt left voicemail at Triage pt said she "blew out that clot" and had her nose cauterized and wanted to know if it's okay to use a nose spray, pt request call back

## 2015-04-03 NOTE — Telephone Encounter (Signed)
Did she see ENT or have this done at UC/ER?

## 2015-04-03 NOTE — Telephone Encounter (Signed)
Pt states she did call the Dr she saw but it was in the ER--per verbal order it is okay for saline nasal spray--pt is aware as instructed

## 2015-04-05 ENCOUNTER — Other Ambulatory Visit: Payer: Self-pay | Admitting: Internal Medicine

## 2015-04-24 ENCOUNTER — Encounter: Payer: Self-pay | Admitting: Internal Medicine

## 2015-04-24 ENCOUNTER — Ambulatory Visit (INDEPENDENT_AMBULATORY_CARE_PROVIDER_SITE_OTHER): Payer: 59 | Admitting: Internal Medicine

## 2015-04-24 ENCOUNTER — Telehealth: Payer: Self-pay | Admitting: Internal Medicine

## 2015-04-24 VITALS — BP 124/78 | HR 71 | Temp 98.6°F | Wt 247.0 lb

## 2015-04-24 DIAGNOSIS — T25222A Burn of second degree of left foot, initial encounter: Secondary | ICD-10-CM

## 2015-04-24 NOTE — Patient Instructions (Signed)
Burn Care Your skin is a natural barrier to infection. It is the largest organ of your body. Burns damage this natural protection. To help prevent infection, it is very important to follow your caregiver's instructions in the care of your burn. Burns are classified as:  First degree. There is only redness of the skin (erythema). No scarring is expected.  Second degree. There is blistering of the skin. Scarring may occur with deeper burns.  Third degree. All layers of the skin are injured, and scarring is expected. HOME CARE INSTRUCTIONS   Wash your hands well before changing your bandage.  Change your bandage as often as directed by your caregiver.  Remove the old bandage. If the bandage sticks, you may soak it off with cool, clean water.  Cleanse the burn thoroughly but gently with mild soap and water.  Pat the area dry with a clean, dry cloth.  Apply a thin layer of antibacterial cream to the burn.  Apply a clean bandage as instructed by your caregiver.  Keep the bandage as clean and dry as possible.  Elevate the affected area for the first 24 hours, then as instructed by your caregiver.  Only take over-the-counter or prescription medicines for pain, discomfort, or fever as directed by your caregiver. SEEK IMMEDIATE MEDICAL CARE IF:   You develop excessive pain.  You develop redness, tenderness, swelling, or red streaks near the burn.  The burned area develops yellowish-white fluid (pus) or a bad smell.  You have a fever. MAKE SURE YOU:   Understand these instructions.  Will watch your condition.  Will get help right away if you are not doing well or get worse. Document Released: 11/30/2005 Document Revised: 02/22/2012 Document Reviewed: 04/22/2011 ExitCare Patient Information 2015 [employer]. This information is not intended to replace advice given to you by your health care provider. Make sure you discuss any questions you have with your health care  provider.  

## 2015-04-24 NOTE — Progress Notes (Signed)
Pre visit review using our clinic review tool, if applicable. No additional management support is needed unless otherwise documented below in the visit note. 

## 2015-04-24 NOTE — Telephone Encounter (Signed)
Pt has appt 04/24/2015 at 2 pm with Nicki Reaper NP.

## 2015-04-24 NOTE — Progress Notes (Signed)
Subjective:    Patient ID: Kathy Gamble, female    DOB: 1976-10-22, 39 y.o.   MRN: [id number]  HPI  Pt presents to the clinic today with c/o foot pain. She reports she dropped a plastic container of hot food on it last night. She describes the pain as throbbing. There is a place on her foot that is red and has a blister on it. She is not having any difficulty walking. She has not seen any drainage from the area. She has not tried anything OTC.  Review of Systems  Past Medical History  Diagnosis Date  . Chicken pox   . GERD (gastroesophageal reflux disease)   . Frequent headaches   . Allergy   . Bee sting allergy 03/02/2015    Current Outpatient Prescriptions  Medication Sig Dispense Refill  . cetirizine (ZYRTEC) 10 MG tablet Take 1 tablet (10 mg total) by mouth daily. 90 tablet 1  . fluticasone (FLONASE) 50 MCG/ACT nasal spray Place 2 sprays into both nostrils daily. 16 g 6  . omeprazole (PRILOSEC) 40 MG capsule TAKE ONE CAPSULE BY MOUTH ONCE DAILY 30 capsule 5  . topiramate (TOPAMAX) 100 MG tablet TAKE ONE TABLET BY MOUTH AT BEDTIME     No current facility-administered medications for this visit.    Allergies  Allergen Reactions  . Bee Venom Itching  . Latex Rash    The powder in the gloves causes rash    Family History  Problem Relation Age of Onset  . Stroke Mother   . Mental retardation Mother   . Cancer Maternal Grandmother   . Cancer Paternal Grandmother     Breast  . Heart disease Paternal Grandfather     History   Social History  . Marital Status: Married    Spouse Name: N/A  . Number of Children: N/A  . Years of Education: N/A   Occupational History  . Not on file.   Social History Main Topics  . Smoking status: Never Smoker   . Smokeless tobacco: Not on file  . Alcohol Use: No  . Drug Use: No  . Sexual Activity: Yes    Birth Control/ Protection: None   Other Topics Concern  . Not on file   Social History Narrative     Constitutional:  Denies fever, malaise, fatigue, headache or abrupt weight changes.  Respiratory: Denies difficulty breathing, shortness of breath, cough or sputum production.   Cardiovascular: Denies chest pain, chest tightness, palpitations or swelling in the hands or feet.  Musculoskeletal: Pt reports left foot pain. Denies decrease in range of motion, difficulty with gait, muscle pain or joint pain and swelling.  Skin: Pt reports burn to left foot. Denies rashes or ulcercations.    No other specific complaints in a complete review of systems (except as listed in HPI above).     Objective:   Physical Exam   BP 124/78 mmHg  Pulse 71  Temp(Src) 98.6 F (37 C) (Oral)  Wt 247 lb (112.038 kg)  SpO2 99% Wt Readings from Last 3 Encounters:  04/24/15 247 lb (112.038 kg)  03/20/15 248 lb 12.8 oz (112.855 kg)  03/07/15 247 lb (112.038 kg)    General: Appears her stated age, obese in NAD. Skin: Small area of redness. Mildly tender to touch. No warmth noted. Small bruise at the base of the left big toe. Cardiovascular: Normal rate and rhythm. S1,S2 noted.  No murmur, rubs or gallops noted. Pulmonary/Chest: Normal effort and positive vesicular breath sounds.  No respiratory distress. No wheezes, rales or ronchi noted.  Musculoskeletal: Normal flexion, extension and rotation of the left ankle. No difficulty with gait.    BMET    Component Value Date/Time   NA 137 01/02/2015 1518   K 3.7 01/02/2015 1518   CL 104 01/02/2015 1518   CO2 29 01/02/2015 1518   GLUCOSE 92 01/02/2015 1518   BUN 16 01/02/2015 1518   CREATININE 0.70 01/02/2015 1518   CALCIUM 8.7 01/02/2015 1518   GFRNONAA >60 09/22/2010 1020   GFRAA  09/22/2010 1020    >60        The eGFR has been calculated using the MDRD equation. This calculation has not been validated in all clinical situations. eGFR's persistently <60 mL/min signify possible Chronic Kidney Disease.    Lipid Panel     Component Value Date/Time   CHOL 121  01/02/2015 1518   TRIG 76.0 01/02/2015 1518   HDL 40.30 01/02/2015 1518   CHOLHDL 3 01/02/2015 1518   VLDL 15.2 01/02/2015 1518   LDLCALC 66 01/02/2015 1518    CBC    Component Value Date/Time   WBC 7.5 01/02/2015 1518   RBC 4.38 01/02/2015 1518   HGB 13.3 01/02/2015 1518   HCT 38.7 01/02/2015 1518   PLT 295.0 01/02/2015 1518   MCV 88.4 01/02/2015 1518   MCH 30.2 09/03/2011 1454   MCHC 34.4 01/02/2015 1518   RDW 13.1 01/02/2015 1518   LYMPHSABS 2.4 09/22/2010 1020   MONOABS 0.3 09/22/2010 1020   EOSABS 0.1 09/22/2010 1020   BASOSABS 0.0 09/22/2010 1020    Hgb A1C Lab Results  Component Value Date   HGBA1C 5.0 01/02/2015        Assessment & Plan:   Arletha Pili to left foot:  Advised her to put some aloe vera lotion on the affected area She can not wear a shoe at home to keep pressure off it Watch for increased redness, pain or drainage from the area  RTC as needed or if symptoms persist or worsen

## 2015-04-24 NOTE — Telephone Encounter (Signed)
Patient Name: Kathy Gamble DOB: 07-Feb-1976 Initial Comment [name] states, dropped something on her foot last night , discolored today Nurse Assessment Nurse: Charna Elizabeth, RN, Cathy Date/Time (Eastern Time): 04/24/2015 11:32:22 AM Confirm and document reason for call. If symptomatic, describe symptoms. ---[name] states she dropped something on her left foot last night and has bruising/swelling of her foot today. No bleeding. No fever. Has the patient traveled out of the country within the last 30 days? ---No Does the patient require triage? ---Yes Related visit to physician within the last 2 weeks? ---No Does the PT have any chronic conditions? (i.e. diabetes, asthma, etc.) ---No Did the patient indicate they were pregnant? ---No Guidelines Guideline Title Affirmed Question Affirmed Notes Foot and Ankle Injury Large swelling or bruise (> 2 inches or 5 cm) Final Disposition User See Physician within 24 Hours [place], RN, Lynden Ang Comments Scheduled for 2pm appointment today with Dr. Nicki Reaper.

## 2015-05-17 ENCOUNTER — Encounter: Payer: Self-pay | Admitting: Internal Medicine

## 2015-05-17 ENCOUNTER — Ambulatory Visit (INDEPENDENT_AMBULATORY_CARE_PROVIDER_SITE_OTHER): Payer: 59 | Admitting: Internal Medicine

## 2015-05-17 VITALS — BP 118/84 | HR 100 | Temp 98.4°F | Wt 246.0 lb

## 2015-05-17 DIAGNOSIS — F418 Other specified anxiety disorders: Secondary | ICD-10-CM

## 2015-05-17 DIAGNOSIS — R059 Cough, unspecified: Secondary | ICD-10-CM

## 2015-05-17 DIAGNOSIS — R233 Spontaneous ecchymoses: Secondary | ICD-10-CM

## 2015-05-17 DIAGNOSIS — F329 Major depressive disorder, single episode, unspecified: Secondary | ICD-10-CM

## 2015-05-17 DIAGNOSIS — F419 Anxiety disorder, unspecified: Secondary | ICD-10-CM

## 2015-05-17 DIAGNOSIS — R238 Other skin changes: Secondary | ICD-10-CM

## 2015-05-17 DIAGNOSIS — R05 Cough: Secondary | ICD-10-CM

## 2015-05-17 LAB — COMPREHENSIVE METABOLIC PANEL
ALK PHOS: 84 U/L (ref 39–117)
ALT: 11 U/L (ref 0–35)
AST: 13 U/L (ref 0–37)
Albumin: 3.6 g/dL (ref 3.5–5.2)
BILIRUBIN TOTAL: 0.4 mg/dL (ref 0.2–1.2)
BUN: 13 mg/dL (ref 6–23)
CHLORIDE: 109 meq/L (ref 96–112)
CO2: 24 mEq/L (ref 19–32)
Calcium: 8.5 mg/dL (ref 8.4–10.5)
Creatinine, Ser: 0.73 mg/dL (ref 0.40–1.20)
GFR: 94.57 mL/min (ref >60.00)
GLUCOSE: 99 mg/dL (ref 70–99)
Potassium: 3.4 mEq/L — ABNORMAL LOW (ref 3.5–5.1)
SODIUM: 137 meq/L (ref 135–145)
Total Protein: 6.3 g/dL (ref 6.0–8.3)

## 2015-05-17 LAB — CBC WITH DIFFERENTIAL/PLATELET
BASOS ABS: 0 10*3/uL (ref 0.0–0.1)
BASOS PCT: 0.6 % (ref 0.0–3.0)
EOS ABS: 0.1 10*3/uL (ref 0.0–0.7)
EOS PCT: 1 % (ref 0.0–5.0)
HEMATOCRIT: 39.2 % (ref 36.0–46.0)
HEMOGLOBIN: 13 g/dL (ref 12.0–15.0)
Lymphocytes Relative: 39.4 % (ref 12.0–46.0)
Lymphs Abs: 2.9 10*3/uL (ref 0.7–4.0)
MCHC: 33.3 g/dL (ref 30.0–36.0)
MCV: 89 fl (ref 78.0–100.0)
MONO ABS: 0.3 10*3/uL (ref 0.1–1.0)
MONOS PCT: 4.6 % (ref 3.0–12.0)
Neutro Abs: 4 10*3/uL (ref 1.4–7.7)
Neutrophils Relative %: 54.4 % (ref 43.0–77.0)
Platelets: 244 10*3/uL (ref 150.0–400.0)
RBC: 4.4 Mil/uL (ref 3.87–5.11)
RDW: 13.6 % (ref 11.5–15.5)
WBC: 7.4 10*3/uL (ref 4.0–10.5)

## 2015-05-17 MED ORDER — BENZONATATE 100 MG PO CAPS: 100.0000 mg | ORAL_CAPSULE | Freq: Two times a day (BID) | ORAL | Status: DC | PRN

## 2015-05-17 NOTE — Progress Notes (Signed)
Subjective:    Patient ID: Kathy Gamble, female    DOB: October 06, 1976, 39 y.o.   MRN: [id number]  HPI  Pt presents to the today with several complaints.  Easy bruising: She has multiple bruises on her legs. She even has on on her buttocks. She thinks she got them by running into her bedframe. They do not hurt. She has not noticed any redness or warmth around them. She is not sure what to do for them. She has not tried anything OTC.  She reports that some nights she will wake up gasping for air. This occurs intermittently. She denies having bad dreams. She does feel a little anxious. She has been depressed in the past, but not depressed now. She feels like she needs to get out of the house. 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 She feels like all she does is sit at home and watch her son.  Cough: This started 3 days ago. The cough is not productive. She denies any other URI symptoms. She denies fever, chills or body aches. She has not taken anything OTC. She would like a RX for Tessalon Pearls" which she has taken in the past with good relief.   Review of Systems      Past Medical History  Diagnosis Date  . Chicken pox   . GERD (gastroesophageal reflux disease)   . Frequent headaches   . Allergy   . Bee sting allergy 03/02/2015    Current Outpatient Prescriptions  Medication Sig Dispense Refill  . cetirizine (ZYRTEC) 10 MG tablet Take 1 tablet (10 mg total) by mouth daily. 90 tablet 1  . omeprazole (PRILOSEC) 40 MG capsule TAKE ONE CAPSULE BY MOUTH ONCE DAILY 30 capsule 5  . topiramate (TOPAMAX) 100 MG tablet TAKE ONE TABLET BY MOUTH AT BEDTIME     No current facility-administered medications for this visit.    Allergies  Allergen Reactions  . Bee Venom Itching  . Latex Rash    The powder in the gloves causes rash    Family History  Problem Relation Age of Onset  . Stroke Mother   . Mental retardation Mother   . Cancer Maternal Grandmother   . Cancer  Paternal Grandmother     Breast  . Heart disease Paternal Grandfather     History   Social History  . Marital Status: Married    Spouse Name: N/A  . Number of Children: N/A  . Years of Education: N/A   Occupational History  . Not on file.   Social History Main Topics  . Smoking status: Never Smoker   . Smokeless tobacco: Not on file  . Alcohol Use: No  . Drug Use: No  . Sexual Activity: Yes    Birth Control/ Protection: None   Other Topics Concern  . Not on file   Social History Narrative     Constitutional: Denies fever, malaise, fatigue, headache or abrupt weight changes.  HEENT: Denies eye pain, eye redness, ear pain, ringing in the ears, wax buildup, runny nose, nasal congestion, bloody nose, or sore throat. Respiratory: Pt reports cough. Denies difficulty breathing, shortness of breath, or sputum production.   Cardiovascular: Denies chest pain, chest tightness, palpitations or swelling in the hands or feet.  Skin: Pt reports bruising. Denies redness, rashes, lesions or ulcercations.  Neurological: Denies dizziness, difficulty with memory, difficulty with speech or problems with balance and coordination.  Psych: Pt reports anxiety and history of depression. Denies  SI/HI.  No other specific complaints in a complete review of systems (except as listed in HPI above).  Objective:   Physical Exam   BP 118/84 mmHg  Pulse 100  Temp(Src) 98.4 F (36.9 C) (Oral)  Wt 246 lb (111.585 kg)  SpO2 99%  Wt Readings from Last 3 Encounters:  05/17/15 246 lb (111.585 kg)  04/24/15 247 lb (112.038 kg)  03/20/15 248 lb 12.8 oz (112.855 kg)    General: Appears her stated age, obese in NAD. Skin: Warm, dry and intact. Multiple large, baseball size bruises noted to bilateral legs. Not hematomas, not warm or tender to touch. HEENT: Head: normal shape and size; Throat/Mouth: Teeth present, mucosa pink and moist, no exudate, lesions or ulcerations noted.  Neck: No adenopathy  noted.  Cardiovascular: Normal rate and rhythm. S1,S2 noted.  No murmur, rubs or gallops noted.  Pulmonary/Chest: Normal effort and positive vesicular breath sounds. No respiratory distress. No wheezes, rales or ronchi noted.  Abdomen: Soft and nontender. Normal bowel sounds, no bruits noted. No distention or masses noted. Liver, spleen and kidneys non palpable. Psychiatric: Mood and affect slightly flat.   BMET    Component Value Date/Time   NA 137 01/02/2015 1518   K 3.7 01/02/2015 1518   CL 104 01/02/2015 1518   CO2 29 01/02/2015 1518   GLUCOSE 92 01/02/2015 1518   BUN 16 01/02/2015 1518   CREATININE 0.70 01/02/2015 1518   CALCIUM 8.7 01/02/2015 1518   GFRNONAA >60 09/22/2010 1020   GFRAA  09/22/2010 1020    >60        The eGFR has been calculated using the MDRD equation. This calculation has not been validated in all clinical situations. eGFR's persistently <60 mL/min signify possible Chronic Kidney Disease.    Lipid Panel     Component Value Date/Time   CHOL 121 01/02/2015 1518   TRIG 76.0 01/02/2015 1518   HDL 40.30 01/02/2015 1518   CHOLHDL 3 01/02/2015 1518   VLDL 15.2 01/02/2015 1518   LDLCALC 66 01/02/2015 1518    CBC    Component Value Date/Time   WBC 7.5 01/02/2015 1518   RBC 4.38 01/02/2015 1518   HGB 13.3 01/02/2015 1518   HCT 38.7 01/02/2015 1518   PLT 295.0 01/02/2015 1518   MCV 88.4 01/02/2015 1518   MCH 30.2 09/03/2011 1454   MCHC 34.4 01/02/2015 1518   RDW 13.1 01/02/2015 1518   LYMPHSABS 2.4 09/22/2010 1020   MONOABS 0.3 09/22/2010 1020   EOSABS 0.1 09/22/2010 1020   BASOSABS 0.0 09/22/2010 1020    Hgb A1C Lab Results  Component Value Date   HGBA1C 5.0 01/02/2015        Assessment & Plan:   Easy bruising:  Will go ahead and check CBC and CMET today No intervention needed Reassured her that the bruises will resolve with time  Cough:  eRx for Tessalon Pearles Watch for fever, chills, productive cough or shortness of  breath  Anxiety and Depression:  Situational Support offered today  She is not interested in any treatment at this time  Will follow up with you after labs are back, RTC as needed

## 2015-05-17 NOTE — Progress Notes (Signed)
Pre visit review using our clinic review tool, if applicable. No additional management support is needed unless otherwise documented below in the visit note. 

## 2015-05-17 NOTE — Patient Instructions (Signed)
Contusion °A contusion is a deep bruise. Contusions are the result of an injury that caused bleeding under the skin. The contusion may turn blue, purple, or yellow. Minor injuries will give you a painless contusion, but more severe contusions may stay painful and swollen for a few weeks.  °CAUSES  °A contusion is usually caused by a blow, trauma, or direct force to an area of the body. °SYMPTOMS  °· Swelling and redness of the injured area. °· Bruising of the injured area. °· Tenderness and soreness of the injured area. °· Pain. °DIAGNOSIS  °The diagnosis can be made by taking a history and physical exam. An X-ray, CT scan, or MRI may be needed to determine if there were any associated injuries, such as fractures. °TREATMENT  °Specific treatment will depend on what area of the body was injured. In general, the best treatment for a contusion is resting, icing, elevating, and applying cold compresses to the injured area. Over-the-counter medicines may also be recommended for pain control. Ask your caregiver what the best treatment is for your contusion. °HOME CARE INSTRUCTIONS  °· Put ice on the injured area. °¨ Put ice in a plastic bag. °¨ Place a towel between your skin and the bag. °¨ Leave the ice on for 15-20 minutes, 3-4 times a day, or as directed by your health care provider. °· Only take over-the-counter or prescription medicines for pain, discomfort, or fever as directed by your caregiver. Your caregiver may recommend avoiding anti-inflammatory medicines (aspirin, ibuprofen, and naproxen) for 48 hours because these medicines may increase bruising. °· Rest the injured area. °· If possible, elevate the injured area to reduce swelling. °SEEK IMMEDIATE MEDICAL CARE IF:  °· You have increased bruising or swelling. °· You have pain that is getting worse. °· Your swelling or pain is not relieved with medicines. °MAKE SURE YOU:  °· Understand these instructions. °· Will watch your condition. °· Will get help right  away if you are not doing well or get worse. °Document Released: 09/09/2005 Document Revised: 12/05/2013 Document Reviewed: 10/05/2011 °ExitCare® Patient Information ©2015 [employer]. This information is not intended to replace advice given to you by your health care provider. Make sure you discuss any questions you have with your health care provider. ° °

## 2015-05-20 ENCOUNTER — Telehealth: Payer: Self-pay

## 2015-05-20 ENCOUNTER — Other Ambulatory Visit: Payer: Self-pay

## 2015-05-20 MED ORDER — LEVOCETIRIZINE DIHYDROCHLORIDE 5 MG PO TABS: 5.0000 mg | ORAL_TABLET | Freq: Every evening | ORAL | Status: DC

## 2015-05-20 NOTE — Telephone Encounter (Signed)
Mel is anything covered by her insurance?

## 2015-05-20 NOTE — Telephone Encounter (Signed)
Pt said ins will not approve zyrtec and pt request GCL allergy 10 mg or GNP allergy 10 mg sent to [facility] [address]; both meds are tier 1. Asked pt and a man pt put on phone for more info about name of meds but that was all ins gave pt.Please advise.

## 2015-05-20 NOTE — Telephone Encounter (Signed)
Looked up formulary for [facility]--per verbal order sent Xyzal  to pharmacy

## 2015-06-03 ENCOUNTER — Ambulatory Visit (INDEPENDENT_AMBULATORY_CARE_PROVIDER_SITE_OTHER): Payer: 59 | Admitting: Internal Medicine

## 2015-06-03 ENCOUNTER — Encounter: Payer: Self-pay | Admitting: Internal Medicine

## 2015-06-03 DIAGNOSIS — J069 Acute upper respiratory infection, unspecified: Secondary | ICD-10-CM | POA: Diagnosis not present

## 2015-06-03 DIAGNOSIS — B9789 Other viral agents as the cause of diseases classified elsewhere: Principal | ICD-10-CM

## 2015-06-03 MED ORDER — HYDROCODONE-HOMATROPINE 5-1.5 MG/5ML PO SYRP: 5.0000 mL | ORAL_SOLUTION | Freq: Three times a day (TID) | ORAL | Status: DC | PRN

## 2015-06-03 NOTE — Progress Notes (Signed)
Pre visit review using our clinic review tool, if applicable. No additional management support is needed unless otherwise documented below in the visit note. 

## 2015-06-03 NOTE — Progress Notes (Signed)
HPI  Pt presents to the clinic today with c/o runny nose, sore throat and cough. This started 1-2 days ago. She is blowing clear mucous out of her nose. The cough is productive of clear mucous. She denies fever, chills or body aches. She did use her husbands inhaler with some relief. She does have a history of seasonal allergies. She has had sick contacts.  Review of Systems      Past Medical History  Diagnosis Date  . Chicken pox   . GERD (gastroesophageal reflux disease)   . Frequent headaches   . Allergy   . Bee sting allergy 03/02/2015    Family History  Problem Relation Age of Onset  . Stroke Mother   . Mental retardation Mother   . Cancer Maternal Grandmother   . Cancer Paternal Grandmother     Breast  . Heart disease Paternal Grandfather     History   Social History  . Marital Status: Married    Spouse Name: N/A  . Number of Children: N/A  . Years of Education: N/A   Occupational History  . Not on file.   Social History Main Topics  . Smoking status: Never Smoker   . Smokeless tobacco: Not on file  . Alcohol Use: No  . Drug Use: No  . Sexual Activity: Yes    Birth Control/ Protection: None   Other Topics Concern  . Not on file   Social History Narrative    Allergies  Allergen Reactions  . Bee Venom Itching  . Latex Rash    The powder in the gloves causes rash     Constitutional: Denies headache, fatigue, fever or abrupt weight changes.  HEENT:  Positive runny nose, sore throat. Denies eye redness, eye pain, pressure behind the eyes, facial pain, nasal congestion, ear pain, ringing in the ears, wax buildup, or bloody nose. Respiratory: Positive cough. Denies difficulty breathing or shortness of breath.  Cardiovascular: Denies chest pain, chest tightness, palpitations or swelling in the hands or feet.   No other specific complaints in a complete review of systems (except as listed in HPI above).  Objective:   BP 120/80 mmHg  Pulse 91   Temp(Src) 98.2 F (36.8 C) (Oral)  Wt 245 lb (111.131 kg)  SpO2 98%  Wt Readings from Last 3 Encounters:  06/03/15 245 lb (111.131 kg)  05/17/15 246 lb (111.585 kg)  04/24/15 247 lb (112.038 kg)     General: Appears her stated age, in NAD. HEENT: Head: normal shape and size, no sinus tenderness noted; Eyes: sclera white, no icterus, conjunctiva pink; Ears: Tm's gray and intact, normal light reflex; Nose: mucosa pink and moist, septum midline; Throat/Mouth: + PND. Teeth present, mucosa erythematous and moist, no exudate noted, no lesions or ulcerations noted.  Neck: Mild cervical lymphadenopathy.  Cardiovascular: Normal rate and rhythm. S1,S2 noted.  No murmur, rubs or gallops noted. No JVD or BLE edema. No carotid bruits noted. Pulmonary/Chest: Normal effort and positive vesicular breath sounds. No respiratory distress. No wheezes, rales or ronchi noted.      Assessment & Plan:   Upper Respiratory Infection with Cough:  Likely viral Get some rest and drink plenty of water Do salt water gargles for the sore throat Rx for Hycodan cough syrup  RTC as needed or if symptoms persist.

## 2015-06-03 NOTE — Patient Instructions (Signed)
Cough, Adult  A cough is a reflex that helps clear your throat and airways. It can help heal the body or may be a reaction to an irritated airway. A cough may only last 2 or 3 weeks (acute) or may last more than 8 weeks (chronic).  CAUSES Acute cough:  Viral or bacterial infections. Chronic cough:  Infections.  Allergies.  Asthma.  Post-nasal drip.  Smoking.  Heartburn or acid reflux.  Some medicines.  Chronic lung problems (COPD).  Cancer. SYMPTOMS   Cough.  Fever.  Chest pain.  Increased breathing rate.  High-pitched whistling sound when breathing (wheezing).  Colored mucus that you cough up (sputum). TREATMENT   A bacterial cough may be treated with antibiotic medicine.  A viral cough must run its course and will not respond to antibiotics.  Your caregiver may recommend other treatments if you have a chronic cough. HOME CARE INSTRUCTIONS   Only take over-the-counter or prescription medicines for pain, discomfort, or fever as directed by your caregiver. Use cough suppressants only as directed by your caregiver.  Use a cold steam vaporizer or humidifier in your bedroom or home to help loosen secretions.  Sleep in a semi-upright position if your cough is worse at night.  Rest as needed.  Stop smoking if you smoke. SEEK IMMEDIATE MEDICAL CARE IF:   You have pus in your sputum.  Your cough starts to worsen.  You cannot control your cough with suppressants and are losing sleep.  You begin coughing up blood.  You have difficulty breathing.  You develop pain which is getting worse or is uncontrolled with medicine.  You have a fever. MAKE SURE YOU:   Understand these instructions.  Will watch your condition.  Will get help right away if you are not doing well or get worse. Document Released: 05/29/2011 Document Revised: 02/22/2012 Document Reviewed: 05/29/2011 ExitCare Patient Information 2015 [employer]. This information is not intended  to replace advice given to you by your health care provider. Make sure you discuss any questions you have with your health care provider.  

## 2015-06-16 ENCOUNTER — Other Ambulatory Visit: Payer: Self-pay | Admitting: Internal Medicine

## 2015-06-18 NOTE — Telephone Encounter (Signed)
Please refill times one  If cough if not improved later this week - schedule an appt please

## 2015-06-18 NOTE — Telephone Encounter (Signed)
Rx refilled and left voicemail requesting pt to call office so I can advise her to f/u if sxs don't improve

## 2015-06-18 NOTE — Telephone Encounter (Signed)
Received refill request electronically from pharmacy. Last refill 05/17/15 #20  Last office visit 06/03/15 Medication is no longer on medication list. Is it okay to refill?

## 2015-06-19 NOTE — Telephone Encounter (Signed)
Left message on voicemail.

## 2015-06-19 NOTE — Telephone Encounter (Signed)
Pt notified Rx sent to pharmacy and to f/u if no improvement.   Pt wanted me to ask Nicki Reaper, NP a question. Pt started her period and wants a Rx called in for her pain. I advise pt I will send message to her PCP and Regina's assistant will call her back

## 2015-06-19 NOTE — Telephone Encounter (Signed)
Take Ibuprofen or Midol OTC

## 2015-07-09 ENCOUNTER — Other Ambulatory Visit: Payer: Self-pay | Admitting: Family Medicine

## 2015-07-10 ENCOUNTER — Telehealth: Payer: Self-pay | Admitting: Internal Medicine

## 2015-07-10 NOTE — Telephone Encounter (Signed)
Patient Name: Kathy Gamble  DOB: 1976/06/10    Initial Comment [name] states she has been outside in the sun and she has a headache.   Nurse Assessment  Nurse: Renaldo Fiddler, RN, Raynelle Fanning Date/Time Lamount Cohen Time): 07/10/2015 2:04:20 PM  Confirm and document reason for call. If symptomatic, describe symptoms. ---[name] states she has been outside in the sun off and on all day with her son. She stayed outside for 20 mins, then would comeback in for 5 to 10 mins. She has been drinking liquids, but her knees are hurting and she has a headache. She feels dizzy. Her son has been outside playing chasing bubbles.  Has the patient traveled out of the country within the last 30 days? ---Not Applicable  Does the patient require triage? ---Yes  Related visit to physician within the last 2 weeks? ---No  Does the PT have any chronic conditions? (i.e. diabetes, asthma, etc.) ---Yes  List chronic conditions. ---GERD, seasonal allergies  Did the patient indicate they were pregnant? ---No     Guidelines    Guideline Title Affirmed Question Affirmed Notes  Heat Exposure (Heat Exhaustion and Heat Stroke) Normal mild dehydration suspected (e.g., dizziness, weakness, nausea) from heat exposure    Final Disposition User   Home Care Renaldo Fiddler, RN, Raynelle Fanning

## 2015-07-12 ENCOUNTER — Other Ambulatory Visit: Payer: Self-pay | Admitting: Internal Medicine

## 2015-07-15 ENCOUNTER — Telehealth: Payer: Self-pay | Admitting: Internal Medicine

## 2015-07-15 NOTE — Telephone Encounter (Signed)
Patient Name: DOTTYE TANDON DOB: [birth year] Initial Comment [name] states she has a knot on her head and bruising Nurse Assessment Nurse: Yetta Barre, RN, Miranda Date/Time (Eastern Time): 07/15/2015 1:54:51 PM Confirm and document reason for call. If symptomatic, describe symptoms. ---[name] states 2 days ago she hit the top of her forehead on the door. She has a bruise and a knot on the top of the head. Has the patient traveled out of the country within the last 30 days? ---Not Applicable Does the patient require triage? ---Yes Related visit to physician within the last 2 weeks? ---No Does the PT have any chronic conditions? (i.e. diabetes, asthma, etc.) ---Yes List chronic conditions. ---Allergies, headache Did the patient indicate they were pregnant? ---No Guidelines Guideline Title Affirmed Question Affirmed Notes Head Injury Scalp swelling, bruise or pain (all triage questions negative) Final Disposition User Home Care Yetta Barre, RN, Tamera Punt

## 2015-07-16 ENCOUNTER — Telehealth: Payer: Self-pay | Admitting: Internal Medicine

## 2015-07-16 NOTE — Telephone Encounter (Signed)
Patient Name: Kathy Gamble DOB: Apr 17, 1976 Initial Comment [name] States when she takes her contacts out, she cant seen,. today she has her contacts in and is having constant blinking. feels confused. Nurse Assessment Nurse: Elijah Birk, RN, Lynda Date/Time (Eastern Time): 07/16/2015 11:45:49 AM Confirm and document reason for call. If symptomatic, describe symptoms. ---[name] states when she takes her contacts out, she can't see. When she took her contacts out the day before, she didn't see metal shelf on door & hit her head. Wondering if she needs an x ray. Has a scratch over her right eye, there was a bump, not now. Today, she has her contacts in and is having constant blinking. Feels confused & has dizziness. Has the patient traveled out of the country within the last 30 days? ---Not Applicable Does the patient require triage? ---Yes Related visit to physician within the last 2 weeks? ---No Does the PT have any chronic conditions? (i.e. diabetes, asthma, etc.) ---Yes List chronic conditions. ---headaches Did the patient indicate they were pregnant? ---No Guidelines Guideline Title Affirmed Question Affirmed Notes Head Injury [1] ACUTE NEURO SYMPTOM AND [2] now fine (DEFINITION: difficult to awaken OR confused thinking and talking OR slurred speech OR weakness of arms OR unsteady walking) Final Disposition User Go to ED Now (or PCP triage) Elijah Birk, RN, Stark Bray Comments Difficult for nurse to understand caller at times. Stated she was feeling confused, then said she was just dizzy & having constant blinking. Referrals GO TO FACILITY UNDECIDED Disagree/Comply: Comply

## 2015-07-30 ENCOUNTER — Telehealth: Payer: Self-pay | Admitting: Internal Medicine

## 2015-07-30 ENCOUNTER — Encounter: Payer: Self-pay | Admitting: Internal Medicine

## 2015-07-30 ENCOUNTER — Ambulatory Visit (INDEPENDENT_AMBULATORY_CARE_PROVIDER_SITE_OTHER): Payer: 59 | Admitting: Internal Medicine

## 2015-07-30 VITALS — BP 130/64 | HR 88 | Temp 97.7°F | Wt 244.0 lb

## 2015-07-30 DIAGNOSIS — R05 Cough: Secondary | ICD-10-CM | POA: Diagnosis not present

## 2015-07-30 DIAGNOSIS — S0083XA Contusion of other part of head, initial encounter: Secondary | ICD-10-CM

## 2015-07-30 DIAGNOSIS — R059 Cough, unspecified: Secondary | ICD-10-CM

## 2015-07-30 DIAGNOSIS — B350 Tinea barbae and tinea capitis: Secondary | ICD-10-CM | POA: Diagnosis not present

## 2015-07-30 MED ORDER — GRISEOFULVIN MICROSIZE 500 MG PO TABS: 500.0000 mg | ORAL_TABLET | Freq: Every day | ORAL | Status: DC

## 2015-07-30 MED ORDER — BENZONATATE 100 MG PO CAPS: ORAL_CAPSULE | ORAL | Status: DC

## 2015-07-30 NOTE — Progress Notes (Signed)
Pre visit review using our clinic review tool, if applicable. No additional management support is needed unless otherwise documented below in the visit note. 

## 2015-07-30 NOTE — Telephone Encounter (Signed)
Husband called in stating the [facility] never got the rx. Pt is frustrated with the situation and would like [facility] [place]  cb number is [phone]

## 2015-07-30 NOTE — Progress Notes (Signed)
Subjective:    Patient ID: Kathy Gamble, female    DOB: [birth year], 39 y.o.   MRN: [id number]  HPI  Pt presents to the clinic today with c/o left sided facial pain and swelling. She reports this started this morning.  She was trying to close the trunk of her car and her hand slipped and the trunk bumped her in the side of the face. She has not noticed any blurred vision but does report that she has been slightly dizzy. She denies headache or difficulty hearing. She has not tried anything OTC. She reports that her husband is not putting his hands on her.  Also, she has a dry cough. She would like a refill of her Tessalon. The cough is non productive. She denies fever, chills, body aches or shortness of breath.  Review of Systems      Past Medical History  Diagnosis Date  . Chicken pox   . GERD (gastroesophageal reflux disease)   . Frequent headaches   . Allergy   . Bee sting allergy 03/02/2015    Current Outpatient Prescriptions  Medication Sig Dispense Refill  . benzonatate (TESSALON) 100 MG capsule TAKE ONE CAPSULE BY MOUTH TWICE DAILY AS NEEDED FOR COUGH 20 capsule 0  . HYDROcodone-homatropine (HYCODAN) 5-1.5 MG/5ML syrup Take 5 mLs by mouth every 8 (eight) hours as needed for cough. 120 mL 0  . levocetirizine (XYZAL) 5 MG tablet TAKE ONE TABLET BY MOUTH IN THE EVENING 30 tablet 0  . omeprazole (PRILOSEC) 40 MG capsule TAKE ONE CAPSULE BY MOUTH ONCE DAILY 30 capsule 5  . topiramate (TOPAMAX) 100 MG tablet TAKE ONE TABLET BY MOUTH AT BEDTIME     No current facility-administered medications for this visit.    Allergies  Allergen Reactions  . Bee Venom Itching  . Latex Rash    The powder in the gloves causes rash    Family History  Problem Relation Age of Onset  . Stroke Mother   . Mental retardation Mother   . Cancer Maternal Grandmother   . Cancer Paternal Grandmother     Breast  . Heart disease Paternal Grandfather     Social History   Social History  .  Marital Status: Married    Spouse Name: N/A  . Number of Children: N/A  . Years of Education: N/A   Occupational History  . Not on file.   Social History Main Topics  . Smoking status: Never Smoker   . Smokeless tobacco: Not on file  . Alcohol Use: No  . Drug Use: No  . Sexual Activity: Yes    Birth Control/ Protection: None   Other Topics Concern  . Not on file   Social History Narrative     Constitutional: Denies fever, malaise, fatigue, headache or abrupt weight changes.  HEENT: Denies eye pain, eye redness, ear pain, ringing in the ears, wax buildup, runny nose, nasal congestion, bloody nose, or sore throat. Respiratory: Pt reports cough. Denies difficulty breathing, shortness of breath, or sputum production.   Cardiovascular: Denies chest pain, chest tightness, palpitations or swelling in the hands or feet.  Musculoskeletal: Pt reports neck pain. Denies difficulty with gait, muscle pain or joint pain and swelling.  Skin: Pt reports swelling to left side of face. Denies rashes, lesions or ulcercations.  Neurological: Pt reports dizziness. Denies difficulty with memory, difficulty with speech or problems with balance and coordination.   No other specific complaints in a complete review of systems (except as listed  in HPI above).  Objective:   Physical Exam  BP 130/64 mmHg  Pulse 88  Temp(Src) 97.7 F (36.5 C) (Oral)  Wt 244 lb (110.678 kg)  SpO2 99% Wt Readings from Last 3 Encounters:  07/30/15 244 lb (110.678 kg)  06/03/15 245 lb (111.131 kg)  05/17/15 246 lb (111.585 kg)    General: Appears her stated age, obese in NAD. Skin: Mild swelling noted of left temple. No bruising noted yet. Ringworm noted of scalp. HEENT: Head: normal shape and size; Eyes: sclera white, no icterus, conjunctiva pink, PERRLA and EOMs intact; Ears: Tm's gray and intact, normal light reflex;  Cardiovascular: Normal rate and rhythm. S1,S2 noted.  No murmur, rubs or gallops noted.    Pulmonary/Chest: Normal effort and positive vesicular breath sounds. No respiratory distress. No wheezes, rales or ronchi noted.  Abdomen: Soft and nontender. Normal bowel sounds, no bruits noted. No distention or masses noted. Liver, spleen and kidneys non palpable. Musculoskeletal: Normal flexion, extension and rotation of the cervical spine. No bony tenderness noted. Neurological: Alert and oriented.    BMET    Component Value Date/Time   NA 137 05/17/2015 1334   K 3.4* 05/17/2015 1334   CL 109 05/17/2015 1334   CO2 24 05/17/2015 1334   GLUCOSE 99 05/17/2015 1334   BUN 13 05/17/2015 1334   CREATININE 0.73 05/17/2015 1334   CALCIUM 8.5 05/17/2015 1334   GFRNONAA >60 09/22/2010 1020   GFRAA  09/22/2010 1020    >60        The eGFR has been calculated using the MDRD equation. This calculation has not been validated in all clinical situations. eGFR's persistently <60 mL/min signify possible Chronic Kidney Disease.    Lipid Panel     Component Value Date/Time   CHOL 121 01/02/2015 1518   TRIG 76.0 01/02/2015 1518   HDL 40.30 01/02/2015 1518   CHOLHDL 3 01/02/2015 1518   VLDL 15.2 01/02/2015 1518   LDLCALC 66 01/02/2015 1518    CBC    Component Value Date/Time   WBC 7.4 05/17/2015 1334   RBC 4.40 05/17/2015 1334   HGB 13.0 05/17/2015 1334   HCT 39.2 05/17/2015 1334   PLT 244.0 05/17/2015 1334   MCV 89.0 05/17/2015 1334   MCH 30.2 09/03/2011 1454   MCHC 33.3 05/17/2015 1334   RDW 13.6 05/17/2015 1334   LYMPHSABS 2.9 05/17/2015 1334   MONOABS 0.3 05/17/2015 1334   EOSABS 0.1 05/17/2015 1334   BASOSABS 0.0 05/17/2015 1334    Hgb A1C Lab Results  Component Value Date   HGBA1C 5.0 01/02/2015         Assessment & Plan:   Ringworm of scalp:  Incidentally noted on exam eRx for Griseofulvin 500 mg daily x 4 weeks  Contusion of left side of face:  Accidental Discussed the progression of swelling into bruising She will put ice on the area Tylenol or  Ibuprofen as needed for pain  Cough:  Tessalon refilled today  RTC as needed or if symptoms persist or worsen

## 2015-07-30 NOTE — Patient Instructions (Signed)
Contusion °A contusion is a deep bruise. Contusions are the result of an injury that caused bleeding under the skin. The contusion may turn blue, purple, or yellow. Minor injuries will give you a painless contusion, but more severe contusions may stay painful and swollen for a few weeks.  °CAUSES  °A contusion is usually caused by a blow, trauma, or direct force to an area of the body. °SYMPTOMS  °· Swelling and redness of the injured area. °· Bruising of the injured area. °· Tenderness and soreness of the injured area. °· Pain. °DIAGNOSIS  °The diagnosis can be made by taking a history and physical exam. An X-ray, CT scan, or MRI may be needed to determine if there were any associated injuries, such as fractures. °TREATMENT  °Specific treatment will depend on what area of the body was injured. In general, the best treatment for a contusion is resting, icing, elevating, and applying cold compresses to the injured area. Over-the-counter medicines may also be recommended for pain control. Ask your caregiver what the best treatment is for your contusion. °HOME CARE INSTRUCTIONS  °· Put ice on the injured area. °¨ Put ice in a plastic bag. °¨ Place a towel between your skin and the bag. °¨ Leave the ice on for 15-20 minutes, 3-4 times a day, or as directed by your health care provider. °· Only take over-the-counter or prescription medicines for pain, discomfort, or fever as directed by your caregiver. Your caregiver may recommend avoiding anti-inflammatory medicines (aspirin, ibuprofen, and naproxen) for 48 hours because these medicines may increase bruising. °· Rest the injured area. °· If possible, elevate the injured area to reduce swelling. °SEEK IMMEDIATE MEDICAL CARE IF:  °· You have increased bruising or swelling. °· You have pain that is getting worse. °· Your swelling or pain is not relieved with medicines. °MAKE SURE YOU:  °· Understand these instructions. °· Will watch your condition. °· Will get help right  away if you are not doing well or get worse. °Document Released: 09/09/2005 Document Revised: 12/05/2013 Document Reviewed: 10/05/2011 °ExitCare® Patient Information ©2015 [employer]. This information is not intended to replace advice given to you by your health care provider. Make sure you discuss any questions you have with your health care provider. ° °

## 2015-07-30 NOTE — Addendum Note (Signed)
Addended by: Roena Malady on: 07/30/2015 05:06 PM   Modules accepted: Orders

## 2015-07-30 NOTE — Telephone Encounter (Signed)
Spoke to pt's husband and [facility] and he is aware---called pharmacy first to make sure they had medication in stock and they do

## 2015-07-30 NOTE — Telephone Encounter (Signed)
Mel can you call the pharmacy to see if the got the griseofulvin?

## 2015-08-02 ENCOUNTER — Telehealth: Payer: Self-pay

## 2015-08-02 DIAGNOSIS — B350 Tinea barbae and tinea capitis: Secondary | ICD-10-CM

## 2015-08-02 MED ORDER — GRISEOFULVIN ULTRAMICROSIZE 125 MG PO TABS: 375.0000 mg | ORAL_TABLET | Freq: Every day | ORAL | Status: DC

## 2015-08-02 NOTE — Telephone Encounter (Signed)
Pt states she was able to get the previous Rx and will not need new Rx---pt is aware as instructed

## 2015-08-02 NOTE — Telephone Encounter (Signed)
Received a fax requesting prior auth as the griseofulvin micro is not covered by insurance---I looked up formulary and it states the preferred are: Griseofulving ultra, Gris-Peg, Griseofulvin suspension, Nystatin, Terbinafine, Clotrimazole and Fluconazole are all that is listed on preferred list--please advise

## 2015-08-02 NOTE — Telephone Encounter (Signed)
Please notify Kathy Gamble that I have sent in a replacement for the Griseofluvin that should be covered by her insurance. She will take 3 tablets by mouth once daily. Please also tell her to monitor the "black stuff" in her hair and let us know if this continues to occur throughout the weekend.

## 2015-08-02 NOTE — Telephone Encounter (Addendum)
Pt left v/m; pt was seen 07/30/15 with ringworm of the scalp; pt has been taking the pill for ringworm; pt request a shampoo to use for ringworm in her hair; pt said when she combs her hair she has a build up of "black stuff" in her hair. Pt request [facility] [address]. Nicki Reaper NP is out of office today and will forward note to Mayra Reel NP.

## 2015-08-15 ENCOUNTER — Telehealth: Payer: Self-pay | Admitting: Internal Medicine

## 2015-08-15 NOTE — Telephone Encounter (Signed)
PLEASE NOTE: All timestamps contained within this report are represented as [place] Standard Time. CONFIDENTIALTY NOTICE: This fax transmission is intended only for the addressee. It contains information that is legally privileged, confidential or otherwise protected from use or disclosure. If you are not the intended recipient, you are strictly prohibited from reviewing, disclosing, copying using or disseminating any of this information or taking any action in reliance on or regarding this information. If you have received this fax in error, please notify us immediately by telephone so that we can arrange for its return to [place]. Phone: [phone], Toll-Free: [phone], Fax: [phone] Page: 1 of 2 Call Id: [id number] [facility] Day - Client TELEPHONE ADVICE RECORD [facility] Patient Name: Kathy Gamble Gender: Female DOB: 06/11/1976 Age: 39 Y 9 M 2 D Return Phone Number: [phone] (Primary) Address: [address] City/State/Zip: [place] [address] Client [facility] Day - Client Client Site [facility] - Day Physician Nicki Reaper Contact Type Call Call Type Triage / Clinical Relationship To Patient Self Appointment Disposition EMR Appointment Not Necessary Info pasted into Epic Yes Return Phone Number [phone] (Primary) Chief Complaint Hand Pain Initial Comment [name] states, feels like there is electricity going through hands and feet PreDisposition Call Doctor Nurse Assessment Nurse: Elijah Birk, RN, Stark Bray Date/Time (Eastern Time): 08/15/2015 9:39:52 AM Confirm and document reason for call. If symptomatic, describe symptoms. ---[name] states she feels like there is electricity going through hands and feet, has pain & numbness. Symptoms on & off. Having blurred vision as well, is on a new rx for ring worm (Griseofulvic 500 mg once a day). States dead bugs are dropping from  her head. Has the patient traveled out of the country within the last 30 days? ---Not Applicable Does the patient require triage? ---Yes Related visit to physician within the last 2 weeks? ---No Does the PT have any chronic conditions? (i.e. diabetes, asthma, etc.) ---Yes List chronic conditions. ---headaches Did the patient indicate they were pregnant? ---No Guidelines Guideline Title Affirmed Question Affirmed Notes Nurse Date/Time Lamount Cohen Time) Neurologic Deficit Headache (and neurologic deficit) Elijah Birk, RN, Stark Bray 08/15/2015 9:48:54 AM Disp. Time Lamount Cohen Time) Disposition Final User 08/15/2015 9:52:29 AM Go to ED Now Yes Elijah Birk, RN, Irene Shipper Understands: Yes PLEASE NOTE: All timestamps contained within this report are represented as [place] Standard Time. CONFIDENTIALTY NOTICE: This fax transmission is intended only for the addressee. It contains information that is legally privileged, confidential or otherwise protected from use or disclosure. If you are not the intended recipient, you are strictly prohibited from reviewing, disclosing, copying using or disseminating any of this information or taking any action in reliance on or regarding this information. If you have received this fax in error, please notify us immediately by telephone so that we can arrange for its return to [place]. Phone: [phone], Toll-Free: [phone], Fax: [phone] Page: 2 of 2 Call Id: [id number] Disagree/Comply: Comply Care Advice Given Per Guideline GO TO ED NOW: You need to be seen in the Emergency Department. Go to the ER at ___________ Hospital. Leave now. Drive carefully. CARE ADVICE given per Neurologic Deficit (Adult) guideline. NOTE TO TRIAGER - DRIVING: * Another adult should drive. After Care Instructions Given Call Event Type User Date / Time Description Comments User: Lily Lovings, RN Date/Time Lamount Cohen Time): 08/15/2015 9:55:53 AM [name] is slow to respond to questions, states  she has headache, knee pain, off & on blurry vision & the feelings  of electricity in both hands & feet. Referrals GO TO FACILITY UNDECIDED

## 2015-08-15 NOTE — Telephone Encounter (Signed)
Pt returned your call call number [phone]

## 2015-08-15 NOTE — Telephone Encounter (Signed)
She does not need to go to the ER, she can make an appt to be seen here tomorrow.

## 2015-08-15 NOTE — Telephone Encounter (Signed)
PLEASE NOTE: All timestamps contained within this report are represented as [place] Standard Time. CONFIDENTIALTY NOTICE: This fax transmission is intended only for the addressee. It contains information that is legally privileged, confidential or otherwise protected from use or disclosure. If you are not the intended recipient, you are strictly prohibited from reviewing, disclosing, copying using or disseminating any of this information or taking any action in reliance on or regarding this information. If you have received this fax in error, please notify us immediately by telephone so that we can arrange for its return to [place]. Phone: [phone], Toll-Free: [phone], Fax: [phone] Page: 1 of 1 Call Id: [id number] [facility] Day - Client TELEPHONE ADVICE RECORD [facility] Patient Name: Kathy Gamble DOB: 1976/06/28 Initial Comment [name] states, feels like there is electricity going through hands and feet Nurse Assessment Nurse: Elijah Birk, RN, Stark Bray Date/Time (Eastern Time): 08/15/2015 9:39:52 AM Confirm and document reason for call. If symptomatic, describe symptoms. ---[name] states she feels like there is electricity going through hands and feet, has pain & numbness. Symptoms on & off. Having blurred vision as well, is on a new rx for ring worm (Griseofulvic 500 mg once a day). States dead bugs are dropping from her head. Has the patient traveled out of the country within the last 30 days? ---Not Applicable Does the patient require triage? ---Yes Related visit to physician within the last 2 weeks? ---No Does the PT have any chronic conditions? (i.e. diabetes, asthma, etc.) ---Yes List chronic conditions. ---headaches Did the patient indicate they were pregnant? ---No Guidelines Guideline Title Affirmed Question Affirmed Notes Neurologic Deficit Headache (and neurologic deficit) Final Disposition User Go to ED Now Elijah Birk, RN,  Lynda Comments [name] is slow to respond to questions, states she has headache, knee pain, off & on blurry vision & the feelings of electricity in both hands & feet. Referrals GO TO FACILITY UNDECIDED Disagree/Comply: Comply

## 2015-08-15 NOTE — Telephone Encounter (Signed)
Pt has appt tomorrow with Rene Kocher

## 2015-08-15 NOTE — Telephone Encounter (Signed)
Called home and mobile # no answer lmovm

## 2015-08-16 ENCOUNTER — Other Ambulatory Visit: Payer: Self-pay | Admitting: Internal Medicine

## 2015-08-16 ENCOUNTER — Encounter: Payer: Self-pay | Admitting: Internal Medicine

## 2015-08-16 ENCOUNTER — Ambulatory Visit (INDEPENDENT_AMBULATORY_CARE_PROVIDER_SITE_OTHER): Payer: 59 | Admitting: Internal Medicine

## 2015-08-16 ENCOUNTER — Ambulatory Visit: Payer: 59 | Admitting: Internal Medicine

## 2015-08-16 VITALS — BP 120/76 | HR 75 | Temp 98.0°F | Wt 239.0 lb

## 2015-08-16 DIAGNOSIS — F329 Major depressive disorder, single episode, unspecified: Secondary | ICD-10-CM

## 2015-08-16 DIAGNOSIS — F418 Other specified anxiety disorders: Secondary | ICD-10-CM | POA: Diagnosis not present

## 2015-08-16 DIAGNOSIS — F419 Anxiety disorder, unspecified: Secondary | ICD-10-CM

## 2015-08-16 DIAGNOSIS — R42 Dizziness and giddiness: Secondary | ICD-10-CM

## 2015-08-16 DIAGNOSIS — G44211 Episodic tension-type headache, intractable: Secondary | ICD-10-CM

## 2015-08-16 DIAGNOSIS — T887XXA Unspecified adverse effect of drug or medicament, initial encounter: Secondary | ICD-10-CM | POA: Diagnosis not present

## 2015-08-16 DIAGNOSIS — L299 Pruritus, unspecified: Secondary | ICD-10-CM

## 2015-08-16 DIAGNOSIS — F32A Depression, unspecified: Secondary | ICD-10-CM

## 2015-08-16 DIAGNOSIS — R197 Diarrhea, unspecified: Secondary | ICD-10-CM

## 2015-08-16 DIAGNOSIS — T50905A Adverse effect of unspecified drugs, medicaments and biological substances, initial encounter: Secondary | ICD-10-CM

## 2015-08-16 LAB — COMPREHENSIVE METABOLIC PANEL
ALT: 10 U/L (ref 0–35)
AST: 9 U/L (ref 0–37)
Albumin: 3.7 g/dL (ref 3.5–5.2)
Alkaline Phosphatase: 84 U/L (ref 39–117)
BUN: 16 mg/dL (ref 6–23)
CHLORIDE: 110 meq/L (ref 96–112)
CO2: 24 mEq/L (ref 19–32)
Calcium: 8.5 mg/dL (ref 8.4–10.5)
Creatinine, Ser: 0.72 mg/dL (ref 0.40–1.20)
GFR: 95.97 mL/min (ref >60.00)
GLUCOSE: 81 mg/dL (ref 70–99)
POTASSIUM: 3.8 meq/L (ref 3.5–5.1)
SODIUM: 142 meq/L (ref 135–145)
Total Bilirubin: 0.4 mg/dL (ref 0.2–1.2)
Total Protein: 6.2 g/dL (ref 6.0–8.3)

## 2015-08-16 MED ORDER — TERBINAFINE HCL 250 MG PO TABS: 250.0000 mg | ORAL_TABLET | Freq: Every day | ORAL | Status: DC

## 2015-08-16 NOTE — Progress Notes (Signed)
Pre visit review using our clinic review tool, if applicable. No additional management support is needed unless otherwise documented below in the visit note. 

## 2015-08-16 NOTE — Patient Instructions (Signed)
Ringworm of the Scalp Tinea Capitis is also called scalp ringworm. It is a fungal infection of the skin on the scalp seen mainly in children.  CAUSES  Scalp ringworm spreads from:  Other people.  Pets (cats and dogs) and animals.  Bedding, hats, combs or brushes shared with an infected person  Theater seats that an infected person sat in. SYMPTOMS  Scalp ringworm causes the following symptoms:  Flaky scales that look like dandruff.  Circles of thick, raised red skin.  Hair loss.  Red pimples or pustules.  Swollen glands in the back of the neck.  Itching. DIAGNOSIS  A skin scraping or infected hairs will be sent to test for fungus. Testing can be done either by looking under the microscope (KOH examination) or by doing a culture (test to try to grow the fungus). A culture can take up to 2 weeks to come back. TREATMENT   Scalp ringworm must be treated with medicine by mouth to kill the fungus for 6 to 8 weeks.  Medicated shampoos (ketoconazole or selenium sulfide shampoo) may be used to decrease the shedding of fungal spores from the scalp.  Steroid medicines are used for severe cases that are very inflamed in conjunction with antifungal medication.  It is important that any family members or pets that have the fungus be treated. HOME CARE INSTRUCTIONS   Be sure to treat the rash completely - follow your caregiver's instructions. It can take a month or more to treat. If you do not treat it long enough, the rash can come back.  Watch for other cases in your family or pets.  Do not share brushes, combs, barrettes, or hats. Do not share towels.  Combs, brushes, and hats should be cleaned carefully and natural bristle brushes must be thrown away.  It is not necessary to shave the scalp or wear a hat during treatment.  Children may attend school once they start treatment with the oral medicine.  Be sure to follow up with your caregiver as directed to be sure the infection  is gone. SEEK MEDICAL CARE IF:   Rash is worse.  Rash is spreading.  Rash returns after treatment is completed.  The rash is not better in 2 weeks with treatment. Fungal infections are slow to respond to treatment. Some redness may remain for several weeks after the fungus is gone. SEEK IMMEDIATE MEDICAL CARE IF:  The area becomes red, warm, tender, and swollen.  Pus is oozing from the rash.  You or your child has an oral temperature above 102 F (38.9 C), not controlled by medicine. Document Released: 11/27/2000 Document Revised: 02/22/2012 Document Reviewed: 01/09/2009 ExitCare Patient Information 2015 [employer]. This information is not intended to replace advice given to you by your health care provider. Make sure you discuss any questions you have with your health care provider.  

## 2015-08-16 NOTE — Progress Notes (Signed)
Subjective:    Patient ID: Kathy Gamble, female    DOB: 08-Sep-1976, 40 y.o.   MRN: [id number]  HPI  Pt presents to the clinic today with c/o dizziness, tingling in her hands and feet, itching of the face and diarrhea. This started 2 weeks ago, shortly after starting on Griseofulvin for ringworm of the scalp. She has also noted that "there are black bugs falling out of my hair". The dizziness just feels like a sense of imbalance. Although her face is itching, she has not noticed any swelling of the lips or the throat. She denies difficulty breathing. She has 10 days left on the medication. Her symptoms have not gotten worse but they are also not getting better.  She also reports she has been feeling very stressed. Her family life is hectic. Her son has been throwing tantrums. She is having trouble dealing with all of this. She used to be treated for anxiety and depression. She also used to see a psychiatrist in the past and would like a referral today for a new psychiatrist.  Review of Systems      Past Medical History  Diagnosis Date  . Chicken pox   . GERD (gastroesophageal reflux disease)   . Frequent headaches   . Allergy   . Bee sting allergy 03/02/2015    Current Outpatient Prescriptions  Medication Sig Dispense Refill  . griseofulvin (GRIS-PEG) 125 MG tablet Take 3 tablets (375 mg total) by mouth daily. 90 tablet 0  . levocetirizine (XYZAL) 5 MG tablet TAKE ONE TABLET BY MOUTH IN THE EVENING 30 tablet 0  . omeprazole (PRILOSEC) 40 MG capsule TAKE ONE CAPSULE BY MOUTH ONCE DAILY 30 capsule 5  . topiramate (TOPAMAX) 100 MG tablet TAKE ONE TABLET BY MOUTH AT BEDTIME     No current facility-administered medications for this visit.    Allergies  Allergen Reactions  . Bee Venom Itching  . Latex Rash    The powder in the gloves causes rash    Family History  Problem Relation Age of Onset  . Stroke Mother   . Mental retardation Mother   . Cancer Maternal Grandmother   .  Cancer Paternal Grandmother     Breast  . Heart disease Paternal Grandfather     Social History   Social History  . Marital Status: Married    Spouse Name: N/A  . Number of Children: N/A  . Years of Education: N/A   Occupational History  . Not on file.   Social History Main Topics  . Smoking status: Never Smoker   . Smokeless tobacco: Not on file  . Alcohol Use: No  . Drug Use: No  . Sexual Activity: Yes    Birth Control/ Protection: None   Other Topics Concern  . Not on file   Social History Narrative     Constitutional: Pt reports headache. Denies fever, malaise, fatigue, or abrupt weight changes.  HEENT: Denies eye pain, eye redness, ear pain, ringing in the ears, wax buildup, runny nose, nasal congestion, bloody nose, or sore throat. Respiratory: Denies difficulty breathing, shortness of breath, cough or sputum production.   Cardiovascular: Denies chest pain, chest tightness, palpitations or swelling in the hands or feet.  Gastrointestinal: Pt reports diarrhea. Denies abdominal pain, bloating, constipation, diarrhea or blood in the stool.  Skin: Pt reports ringworm of scalp, and itching of face. Denies redness, rashes, or ulcercations.  Neurological: Pt reports numbness and tingling in her hands and feet. Denies dizziness,  difficulty with memory, difficulty with speech or problems with balance and coordination.  Psych: Pt reports anxiety and depression. Denies SI/HI.  No other specific complaints in a complete review of systems (except as listed in HPI above).  Objective:   Physical Exam   BP 120/76 mmHg  Pulse 75  Temp(Src) 98 F (36.7 C) (Oral)  Wt 239 lb (108.41 kg)  SpO2 99% Wt Readings from Last 3 Encounters:  08/16/15 239 lb (108.41 kg)  07/30/15 244 lb (110.678 kg)  06/03/15 245 lb (111.131 kg)    General: Appears her stated age, obese in NAD. Skin: Warm, dry and intact. The ringworm of the scalp is improving. I do not see any bugs in her  scalp. HEENT: Head: normal shape and size; Eyes: sclera white, no icterus, conjunctiva pink, PERRLA and EOMs intact; Cardiovascular: Normal rate and rhythm. S1,S2 noted.  No murmur, rubs or gallops noted.  Pulmonary/Chest: Normal effort and positive vesicular breath sounds. No respiratory distress. No wheezes, rales or ronchi noted.  Neurological: Alert and oriented. Coordination normal.  Psychiatric: Her though content seems delayed. Her affect is normal but she does seem stressed and anxious.  BMET    Component Value Date/Time   NA 137 05/17/2015 1334   K 3.4* 05/17/2015 1334   CL 109 05/17/2015 1334   CO2 24 05/17/2015 1334   GLUCOSE 99 05/17/2015 1334   BUN 13 05/17/2015 1334   CREATININE 0.73 05/17/2015 1334   CALCIUM 8.5 05/17/2015 1334   GFRNONAA >60 09/22/2010 1020   GFRAA  09/22/2010 1020    >60        The eGFR has been calculated using the MDRD equation. This calculation has not been validated in all clinical situations. eGFR's persistently <60 mL/min signify possible Chronic Kidney Disease.    Lipid Panel     Component Value Date/Time   CHOL 121 01/02/2015 1518   TRIG 76.0 01/02/2015 1518   HDL 40.30 01/02/2015 1518   CHOLHDL 3 01/02/2015 1518   VLDL 15.2 01/02/2015 1518   LDLCALC 66 01/02/2015 1518    CBC    Component Value Date/Time   WBC 7.4 05/17/2015 1334   RBC 4.40 05/17/2015 1334   HGB 13.0 05/17/2015 1334   HCT 39.2 05/17/2015 1334   PLT 244.0 05/17/2015 1334   MCV 89.0 05/17/2015 1334   MCH 30.2 09/03/2011 1454   MCHC 33.3 05/17/2015 1334   RDW 13.6 05/17/2015 1334   LYMPHSABS 2.9 05/17/2015 1334   MONOABS 0.3 05/17/2015 1334   EOSABS 0.1 05/17/2015 1334   BASOSABS 0.0 05/17/2015 1334    Hgb A1C Lab Results  Component Value Date   HGBA1C 5.0 01/02/2015        Assessment & Plan:   Headache, dizziness, itching, diarrhea secondary to medication side effect for treatment of tinea capitis:  Stop Griseofulvin Start Terbinafine 250  mg daily x 4 weeks CMET today Advised her ringworm of the scalp does not mean that you have actual bugs in your hair Advised her that her symptoms should subside when the medication is stopped if it is being caused by the medication  Anxiety and Depression:  Referral placed to Psychiatry.- see Rosaria Ferries on the way out to schedule  RTC as needed or if symptoms persist or worsen

## 2015-08-24 ENCOUNTER — Other Ambulatory Visit: Payer: Self-pay | Admitting: Internal Medicine

## 2015-08-27 ENCOUNTER — Telehealth: Payer: Self-pay | Admitting: Internal Medicine

## 2015-08-27 NOTE — Telephone Encounter (Signed)
Pt returned your call.  

## 2015-08-28 ENCOUNTER — Telehealth: Payer: Self-pay | Admitting: Internal Medicine

## 2015-08-28 NOTE — Telephone Encounter (Signed)
[facility] called the patient today to schedule her an appt but the patient didn't answer. Patient called her to see if we called her. I called the patient back to give her the [facility] information to call them back to set up her appt. She said she doesn't want the appt and to call her back later b/c she was in the car and couldn't write. Her husband just called back and I gave him all the info, he said he would talk to her about it. I told Mr Brill that it takes a long time usually to get one of these appts and that [facility]wanted her to go to this appt.

## 2015-08-28 NOTE — Telephone Encounter (Signed)
I did not call her.

## 2015-08-28 NOTE — Telephone Encounter (Signed)
Patient returned Kathy Gamble's call.  Please call patient back on her cell phone.

## 2015-08-28 NOTE — Telephone Encounter (Signed)
Kathy Gamble, Did you call patient?

## 2015-08-28 NOTE — Telephone Encounter (Signed)
Kathy Gamble spoke to pt it was Psych trying to reach her to schedule appt

## 2015-08-28 NOTE — Telephone Encounter (Signed)
Yes, she definitely needs to see a psychiatrist.

## 2015-08-31 ENCOUNTER — Other Ambulatory Visit: Payer: Self-pay | Admitting: Internal Medicine

## 2015-09-10 ENCOUNTER — Ambulatory Visit: Payer: 59 | Admitting: Internal Medicine

## 2015-09-10 ENCOUNTER — Encounter: Payer: Self-pay | Admitting: Internal Medicine

## 2015-09-10 ENCOUNTER — Ambulatory Visit (INDEPENDENT_AMBULATORY_CARE_PROVIDER_SITE_OTHER): Payer: 59 | Admitting: Internal Medicine

## 2015-09-10 VITALS — BP 120/82 | HR 74 | Temp 97.6°F | Wt 235.5 lb

## 2015-09-10 DIAGNOSIS — R202 Paresthesia of skin: Secondary | ICD-10-CM

## 2015-09-10 DIAGNOSIS — R600 Localized edema: Secondary | ICD-10-CM

## 2015-09-10 DIAGNOSIS — R5383 Other fatigue: Secondary | ICD-10-CM | POA: Diagnosis not present

## 2015-09-10 LAB — CBC
HCT: 38.8 % (ref 36.0–46.0)
HEMOGLOBIN: 13 g/dL (ref 12.0–15.0)
MCHC: 33.5 g/dL (ref 30.0–36.0)
MCV: 88.5 fl (ref 78.0–100.0)
Platelets: 266 10*3/uL (ref 150.0–400.0)
RBC: 4.38 Mil/uL (ref 3.87–5.11)
RDW: 13.1 % (ref 11.5–15.5)
WBC: 6.4 10*3/uL (ref 4.0–10.5)

## 2015-09-10 LAB — COMPREHENSIVE METABOLIC PANEL
ALT: 8 U/L (ref 0–35)
AST: 11 U/L (ref 0–37)
Albumin: 3.7 g/dL (ref 3.5–5.2)
Alkaline Phosphatase: 89 U/L (ref 39–117)
BILIRUBIN TOTAL: 0.5 mg/dL (ref 0.2–1.2)
BUN: 12 mg/dL (ref 6–23)
CO2: 26 meq/L (ref 19–32)
Calcium: 8.8 mg/dL (ref 8.4–10.5)
Chloride: 109 mEq/L (ref 96–112)
Creatinine, Ser: 0.88 mg/dL (ref 0.40–1.20)
GFR: 76.1 mL/min (ref >60.00)
GLUCOSE: 96 mg/dL (ref 70–99)
Potassium: 3.8 mEq/L (ref 3.5–5.1)
SODIUM: 140 meq/L (ref 135–145)
TOTAL PROTEIN: 6.5 g/dL (ref 6.0–8.3)

## 2015-09-10 LAB — VITAMIN B12: Vitamin B-12: 501 pg/mL (ref 211–911)

## 2015-09-10 LAB — TSH: TSH: 1.4 u[IU]/mL (ref 0.35–4.50)

## 2015-09-10 LAB — VITAMIN D 25 HYDROXY (VIT D DEFICIENCY, FRACTURES): VITD: 20.68 ng/mL — ABNORMAL LOW (ref 30.00–100.00)

## 2015-09-10 LAB — FOLATE

## 2015-09-10 NOTE — Progress Notes (Signed)
Pre visit review using our clinic review tool, if applicable. No additional management support is needed unless otherwise documented below in the visit note. 

## 2015-09-10 NOTE — Progress Notes (Signed)
Subjective:    Patient ID: Kathy Gamble, female    DOB: 1976-07-23, 39 y.o.   MRN: [id number]  HPI  Pt presents to the clinic today with c/o lower extremity swelling, numbness and tingling This started shortly after she started taking the Lamisil for tinea capitis. She reports the swelling in her legs is painful at times. She also has the numbness and tingling in her hands. She feels like her hands and legs are weak. She feels like she has difficulty with her gait. She has been walking more trying to increase the circulation but does not feel it has helped. She really thinks she should see a physical therapist.  Review of Systems      Past Medical History  Diagnosis Date  . Chicken pox   . GERD (gastroesophageal reflux disease)   . Frequent headaches   . Allergy   . Bee sting allergy 03/02/2015    Current Outpatient Prescriptions  Medication Sig Dispense Refill  . levocetirizine (XYZAL) 5 MG tablet TAKE ONE TABLET BY MOUTH IN THE EVENING 30 tablet 0  . omeprazole (PRILOSEC) 40 MG capsule TAKE ONE CAPSULE BY MOUTH ONCE DAILY 30 capsule 5  . terbinafine (LAMISIL) 250 MG tablet Take 1 tablet (250 mg total) by mouth daily. 30 tablet 0  . topiramate (TOPAMAX) 100 MG tablet TAKE ONE TABLET BY MOUTH AT BEDTIME     No current facility-administered medications for this visit.    Allergies  Allergen Reactions  . Bee Venom Itching  . Latex Rash    The powder in the gloves causes rash    Family History  Problem Relation Age of Onset  . Stroke Mother   . Mental retardation Mother   . Cancer Maternal Grandmother   . Cancer Paternal Grandmother     Breast  . Heart disease Paternal Grandfather     Social History   Social History  . Marital Status: Married    Spouse Name: N/A  . Number of Children: N/A  . Years of Education: N/A   Occupational History  . Not on file.   Social History Main Topics  . Smoking status: Never Smoker   . Smokeless tobacco: Not on file  .  Alcohol Use: No  . Drug Use: No  . Sexual Activity: Yes    Birth Control/ Protection: None   Other Topics Concern  . Not on file   Social History Narrative     Constitutional: Pt reports fatigue. Denies fever, malaise, headache or abrupt weight changes.  Respiratory: Denies difficulty breathing, shortness of breath, cough or sputum production.   Cardiovascular: Pt reports swelling in her feet. Denies chest pain, chest tightness, palpitations or swelling in the hands.  Musculoskeletal:  Denies decrease in range of motion, difficulty with gait, muscle pain or joint pain and swelling.  Skin: Denies redness, rashes, lesions or ulcercations.  Neurological: Pt reports numbness and tingling in her hands and feet. Denies dizziness, difficulty with memory, difficulty with speech or problems with balance and coordination.    No other specific complaints in a complete review of systems (except as listed in HPI above).  Objective:   Physical Exam  BP 120/82 mmHg  Pulse 74  Temp(Src) 97.6 F (36.4 C) (Oral)  Wt 235 lb 8 oz (106.822 kg)  SpO2 98%  LMP 09/06/2015 Wt Readings from Last 3 Encounters:  09/10/15 235 lb 8 oz (106.822 kg)  08/16/15 239 lb (108.41 kg)  07/30/15 244 lb (110.678 kg)  General: Appears her stated age, obese in NAD. Cardiovascular: Normal rate and rhythm. S1,S2 noted.  No murmur, rubs or gallops noted. Trace BLE pretibial edema. Pulmonary/Chest: Normal effort and positive vesicular breath sounds. No respiratory distress. No wheezes, rales or ronchi noted.  Musculoskeletal: Gait is slow but steady. Strength 5/5 BLE. Neurological: Alert and oriented. Sensation intact to BLE.   BMET    Component Value Date/Time   NA 142 08/16/2015 1419   K 3.8 08/16/2015 1419   CL 110 08/16/2015 1419   CO2 24 08/16/2015 1419   GLUCOSE 81 08/16/2015 1419   BUN 16 08/16/2015 1419   CREATININE 0.72 08/16/2015 1419   CALCIUM 8.5 08/16/2015 1419   GFRNONAA >60 09/22/2010 1020     GFRAA  09/22/2010 1020    >60        The eGFR has been calculated using the MDRD equation. This calculation has not been validated in all clinical situations. eGFR's persistently <60 mL/min signify possible Chronic Kidney Disease.    Lipid Panel     Component Value Date/Time   CHOL 121 01/02/2015 1518   TRIG 76.0 01/02/2015 1518   HDL 40.30 01/02/2015 1518   CHOLHDL 3 01/02/2015 1518   VLDL 15.2 01/02/2015 1518   LDLCALC 66 01/02/2015 1518    CBC    Component Value Date/Time   WBC 7.4 05/17/2015 1334   RBC 4.40 05/17/2015 1334   HGB 13.0 05/17/2015 1334   HCT 39.2 05/17/2015 1334   PLT 244.0 05/17/2015 1334   MCV 89.0 05/17/2015 1334   MCH 30.2 09/03/2011 1454   MCHC 33.3 05/17/2015 1334   RDW 13.6 05/17/2015 1334   LYMPHSABS 2.9 05/17/2015 1334   MONOABS 0.3 05/17/2015 1334   EOSABS 0.1 05/17/2015 1334   BASOSABS 0.0 05/17/2015 1334    Hgb A1C Lab Results  Component Value Date   HGBA1C 5.0 01/02/2015         Assessment & Plan:   Fatigue, paresthesias, lower extremity edema:  Exam fairly benign She insist this started after taking Lamisil Advised her to stop taking Lamisil Will check CBC, CMET, TSH, Vit D, B12 and Floate  Will follow up after labs, RTC as needed or if symptoms persist or worsen

## 2015-09-10 NOTE — Patient Instructions (Signed)

## 2015-09-11 ENCOUNTER — Telehealth: Payer: Self-pay | Admitting: Internal Medicine

## 2015-09-11 NOTE — Telephone Encounter (Signed)
Pt called to follow up on labs that were done. Please call [phone] please  thanks

## 2015-09-12 NOTE — Telephone Encounter (Signed)
Left message on voicemail.

## 2015-09-12 NOTE — Telephone Encounter (Signed)
Pt returned call about labs and wants to know what she needs to take Cell [phone]

## 2015-09-13 ENCOUNTER — Other Ambulatory Visit: Payer: Self-pay | Admitting: Internal Medicine

## 2015-09-13 NOTE — Telephone Encounter (Signed)
Pt expressed understanding and is aware of results

## 2015-09-23 ENCOUNTER — Other Ambulatory Visit: Payer: Self-pay | Admitting: Internal Medicine

## 2015-10-14 DIAGNOSIS — R030 Elevated blood-pressure reading, without diagnosis of hypertension: Secondary | ICD-10-CM | POA: Diagnosis not present

## 2015-10-14 DIAGNOSIS — G43019 Migraine without aura, intractable, without status migrainosus: Secondary | ICD-10-CM | POA: Diagnosis not present

## 2015-10-14 DIAGNOSIS — J0141 Acute recurrent pansinusitis: Secondary | ICD-10-CM | POA: Diagnosis not present

## 2015-10-14 DIAGNOSIS — R5383 Other fatigue: Secondary | ICD-10-CM | POA: Diagnosis not present

## 2015-10-19 ENCOUNTER — Other Ambulatory Visit: Payer: Self-pay | Admitting: Internal Medicine

## 2015-11-14 DIAGNOSIS — R42 Dizziness and giddiness: Secondary | ICD-10-CM | POA: Diagnosis not present

## 2015-11-14 DIAGNOSIS — J309 Allergic rhinitis, unspecified: Secondary | ICD-10-CM | POA: Diagnosis not present

## 2015-11-14 DIAGNOSIS — N390 Urinary tract infection, site not specified: Secondary | ICD-10-CM | POA: Diagnosis not present

## 2015-11-14 DIAGNOSIS — R040 Epistaxis: Secondary | ICD-10-CM | POA: Diagnosis not present

## 2015-11-23 ENCOUNTER — Other Ambulatory Visit: Payer: Self-pay | Admitting: Internal Medicine

## 2015-12-02 DIAGNOSIS — G5602 Carpal tunnel syndrome, left upper limb: Secondary | ICD-10-CM | POA: Diagnosis not present

## 2015-12-02 DIAGNOSIS — G5601 Carpal tunnel syndrome, right upper limb: Secondary | ICD-10-CM | POA: Diagnosis not present

## 2015-12-13 DIAGNOSIS — M170 Bilateral primary osteoarthritis of knee: Secondary | ICD-10-CM | POA: Diagnosis not present

## 2015-12-20 DIAGNOSIS — R05 Cough: Secondary | ICD-10-CM | POA: Diagnosis not present

## 2015-12-20 DIAGNOSIS — J069 Acute upper respiratory infection, unspecified: Secondary | ICD-10-CM | POA: Diagnosis not present

## 2016-01-01 DIAGNOSIS — M25561 Pain in right knee: Secondary | ICD-10-CM | POA: Diagnosis not present

## 2016-01-01 DIAGNOSIS — M25562 Pain in left knee: Secondary | ICD-10-CM | POA: Diagnosis not present

## 2016-01-01 DIAGNOSIS — M6281 Muscle weakness (generalized): Secondary | ICD-10-CM | POA: Diagnosis not present

## 2016-01-06 DIAGNOSIS — G64 Other disorders of peripheral nervous system: Secondary | ICD-10-CM | POA: Diagnosis not present

## 2016-01-06 DIAGNOSIS — M25562 Pain in left knee: Secondary | ICD-10-CM | POA: Diagnosis not present

## 2016-01-06 DIAGNOSIS — M25561 Pain in right knee: Secondary | ICD-10-CM | POA: Diagnosis not present

## 2016-01-06 DIAGNOSIS — M6281 Muscle weakness (generalized): Secondary | ICD-10-CM | POA: Diagnosis not present

## 2016-01-08 DIAGNOSIS — M25562 Pain in left knee: Secondary | ICD-10-CM | POA: Diagnosis not present

## 2016-01-08 DIAGNOSIS — M25561 Pain in right knee: Secondary | ICD-10-CM | POA: Diagnosis not present

## 2016-01-08 DIAGNOSIS — M6281 Muscle weakness (generalized): Secondary | ICD-10-CM | POA: Diagnosis not present

## 2016-01-09 ENCOUNTER — Encounter: Payer: Self-pay | Admitting: Urgent Care

## 2016-01-09 ENCOUNTER — Emergency Department
Admission: EM | Admit: 2016-01-09 | Discharge: 2016-01-09 | Disposition: A | Discharge status: Home / Self Care | Payer: Medicare HMO | Attending: Emergency Medicine | Admitting: Emergency Medicine

## 2016-01-09 DIAGNOSIS — S61412A Laceration without foreign body of left hand, initial encounter: Secondary | ICD-10-CM | POA: Insufficient documentation

## 2016-01-09 DIAGNOSIS — Z91040 Latex allergy status: Secondary | ICD-10-CM | POA: Diagnosis not present

## 2016-01-09 DIAGNOSIS — Z79899 Other long term (current) drug therapy: Secondary | ICD-10-CM | POA: Diagnosis not present

## 2016-01-09 DIAGNOSIS — Y281XXA Contact with knife, undetermined intent, initial encounter: Secondary | ICD-10-CM | POA: Diagnosis not present

## 2016-01-09 DIAGNOSIS — Y9289 Other specified places as the place of occurrence of the external cause: Secondary | ICD-10-CM | POA: Insufficient documentation

## 2016-01-09 DIAGNOSIS — Y9389 Activity, other specified: Secondary | ICD-10-CM | POA: Insufficient documentation

## 2016-01-09 DIAGNOSIS — Y998 Other external cause status: Secondary | ICD-10-CM | POA: Insufficient documentation

## 2016-01-09 MED ORDER — LIDOCAINE HCL (PF) 1 % IJ SOLN
INTRAMUSCULAR | Status: AC
  Administered 2016-01-09: 10 mL
  Filled 2016-01-09: qty 5

## 2016-01-09 MED ORDER — BACITRACIN ZINC 500 UNIT/GM EX OINT
TOPICAL_OINTMENT | Freq: Two times a day (BID) | CUTANEOUS | Status: DC
  Administered 2016-01-09: 1 via TOPICAL
  Filled 2016-01-09: qty 0.9

## 2016-01-09 MED ORDER — TRAMADOL HCL 50 MG PO TABS: 50.0000 mg | ORAL_TABLET | Freq: Four times a day (QID) | ORAL | Status: DC | PRN

## 2016-01-09 MED ORDER — LIDOCAINE HCL (PF) 1 % IJ SOLN
INTRAMUSCULAR | Status: AC
  Filled 2016-01-09: qty 5

## 2016-01-09 MED ORDER — OXYCODONE-ACETAMINOPHEN 5-325 MG PO TABS
1.0000 | ORAL_TABLET | Freq: Once | ORAL | Status: AC
  Administered 2016-01-09: 1 via ORAL
  Filled 2016-01-09: qty 1

## 2016-01-09 NOTE — ED Provider Notes (Signed)
[facility] Emergency Department Provider Note  ____________________________________________  Time seen: Approximately 8:08 PM  I have reviewed the triage vital signs and the nursing notes.   HISTORY  Chief Complaint Laceration    HPI Kathy Gamble is a 40 y.o. female patient is a laceration to the left thenar fossa secondary to a knife cut. Bleeding is controlled with direct pressure. She denies any loss of sensation or loss of function. She rates the pain as a 7/10. Except for pressure dressing no other palliative measures taken. Patient is right-hand dominant Past Medical History  Diagnosis Date  . Chicken pox   . GERD (gastroesophageal reflux disease)   . Frequent headaches   . Allergy   . Bee sting allergy 03/02/2015    Patient Active Problem List   Diagnosis Date Noted  . Bee sting allergy 03/02/2015  . Obesity (BMI 30-39.9) 01/02/2015  . Gastroesophageal reflux disease without esophagitis 12/28/2014  . Seasonal allergies 12/28/2014  . Frequent headaches 12/28/2014    Past Surgical History  Procedure Laterality Date  . Tonsillectomy    . Adenoidectomy      Current Outpatient Rx  Name  Route  Sig  Dispense  Refill  . levocetirizine (XYZAL) 5 MG tablet      TAKE ONE TABLET BY MOUTH IN THE EVENING   30 tablet   0   . omeprazole (PRILOSEC) 40 MG capsule      TAKE ONE CAPSULE BY MOUTH ONCE DAILY   30 capsule   0   . terbinafine (LAMISIL) 250 MG tablet   Oral   Take 1 tablet (250 mg total) by mouth daily.   30 tablet   0   . topiramate (TOPAMAX) 100 MG tablet      TAKE ONE TABLET BY MOUTH AT BEDTIME         . traMADol (ULTRAM) 50 MG tablet   Oral   Take 1 tablet (50 mg total) by mouth every 6 (six) hours as needed for moderate pain.   12 tablet   0     Allergies Bee venom and Latex  Family History  Problem Relation Age of Onset  . Stroke Mother   . Mental retardation Mother   . Cancer Maternal Grandmother   .  Cancer Paternal Grandmother     Breast  . Heart disease Paternal Grandfather     Social History Social History  Substance Use Topics  . Smoking status: Never Smoker   . Smokeless tobacco: None  . Alcohol Use: No    Review of Systems Constitutional: No fever/chills Eyes: No visual changes. ENT: No sore throat. Cardiovascular: Denies chest pain. Respiratory: Denies shortness of breath. Gastrointestinal: No abdominal pain.  No nausea, no vomiting.  No diarrhea.  No constipation. Genitourinary: Negative for dysuria. Musculoskeletal: Negative for back pain. Skin: Negative for rash. Laceration to the left thenar fossa Neurological: Negative for headaches, focal weakness or numbness. Allergic/Immunilogical: Bee sting and latex.  10-point ROS otherwise negative.  ____________________________________________   PHYSICAL EXAM:  VITAL SIGNS: ED Triage Vitals  Enc Vitals Group     BP 01/09/16 1918 129/89 mmHg     Pulse Rate 01/09/16 1918 66     Resp 01/09/16 1918 16     Temp 01/09/16 1918 97.5 F (36.4 C)     Temp Source 01/09/16 1918 Oral     SpO2 01/09/16 1918 100 %     Weight 01/09/16 1918 250 lb (113.399 kg)     Height  01/09/16 1918 5\' 10"  (1.778 m)     Head Cir --      Peak Flow --      Pain Score 01/09/16 1919 7     Pain Loc --      Pain Edu? --      Excl. in GC? --      Constitutional: Alert and oriented. Well appearing and in no acute distress. Eyes: Conjunctivae are normal. PERRL. EOMI. Head: Atraumatic. Nose: No congestion/rhinnorhea. Mouth/Throat: Mucous membranes are moist.  Oropharynx non-erythematous. Neck: No stridor.  No cervical spine tenderness to palpation. Hematological/Lymphatic/Immunilogical: No cervical lymphadenopathy. Cardiovascular: Normal rate, regular rhythm. Grossly normal heart sounds.  Good peripheral circulation. Respiratory: Normal respiratory effort.  No retractions. Lungs CTAB. Gastrointestinal: Soft and nontender. No distention. No  abdominal bruits. No CVA tenderness. Musculoskeletal: No lower extremity tenderness nor edema.  No joint effusions. Neurologic:  Normal speech and language. No gross focal neurologic deficits are appreciated. No gait instability. Skin:  Skin is warm, dry and intact. No rash noted. 2.5 cm to the left thenar fossa  Psychiatric: Mood and affect are normal. Speech and behavior are normal.  ____________________________________________   LABS (all labs ordered are listed, but only abnormal results are displayed)  Labs Reviewed - No data to display ____________________________________________  EKG   ____________________________________________  RADIOLOGY   ____________________________________________   PROCEDURES  Procedure(s) performed: Procedure note  Critical Care performed: No LACERATION REPAIR Performed by:  Amado Nash Authorized by: Joni Reining Consent: Verbal consent obtained. Risks and benefits: risks, benefits and alternatives were discussed Consent given by: patient Patient identity confirmed: provided demographic data Prepped and Draped in normal sterile fashion Wound explored  Laceration Location: Left thenar fossa  Laceration Length: 2.5 cm  No Foreign Bodies seen or palpated  Anesthesia: local infiltration  Local anesthetic: lidocaine 1% without epinephrine  Anesthetic total: 7 ml  Irrigation method: syringe Amount of cleaning: standard  Skin closure: 5- Velcro and 4-0prolene Number of sutures: (3)            (7)  Technique Interrupted : Patient tolerated the procedure well with no immediate complications.  ____________________________________________   INITIAL IMPRESSION / ASSESSMENT AND PLAN / ED COURSE  Pertinent labs & imaging results that were available during my care of the patient were reviewed by me and considered in my medical decision making (see chart for details). Left hand laceration. Patient given discharge care  instructions. Patient advised to have sutures removed in 10 days either by family doctor or urgent care clinic.  ____________________________________________   FINAL CLINICAL IMPRESSION(S) / ED DIAGNOSES  Final diagnoses:  Hand laceration, left, initial encounter      Joni Reining, PA-C 01/09/16 1610  Rockne Menghini, MD 01/15/16 2249

## 2016-01-09 NOTE — ED Notes (Signed)
Provider at bedside to repair laceration.  Pt has no complaints. Family at bedside.

## 2016-01-09 NOTE — ED Notes (Signed)
Pt to be driven home by her husband.   She is aware not to drive while on pain medications.

## 2016-01-09 NOTE — Discharge Instructions (Signed)
Laceration Care, Adult  A laceration is a cut that goes through all layers of the skin. The cut also goes into the tissue that is right under the skin. Some cuts heal on their own. Others need to be closed with stitches (sutures), staples, skin adhesive strips, or wound glue. Taking care of your cut lowers your risk of infection and helps your cut to heal better.  HOW TO TAKE CARE OF YOUR CUT  For stitches or staples:  · Keep the wound clean and dry.  · If you were given a bandage (dressing), you should change it at least one time per day or as told by your doctor. You should also change it if it gets wet or dirty.  · Keep the wound completely dry for the first 24 hours or as told by your doctor. After that time, you may take a shower or a bath. However, make sure that the wound is not soaked in water until after the stitches or staples have been removed.  · Clean the wound one time each day or as told by your doctor:    Wash the wound with soap and water.    Rinse the wound with water until all of the soap comes off.    Pat the wound dry with a clean towel. Do not rub the wound.  · After you clean the wound, put a thin layer of antibiotic ointment on it as told by your doctor. This ointment:    Helps to prevent infection.    Keeps the bandage from sticking to the wound.  · Have your stitches or staples removed as told by your doctor.  If your doctor used skin adhesive strips:   · Keep the wound clean and dry.  · If you were given a bandage, you should change it at least one time per day or as told by your doctor. You should also change it if it gets dirty or wet.  · Do not get the skin adhesive strips wet. You can take a shower or a bath, but be careful to keep the wound dry.  · If the wound gets wet, pat it dry with a clean towel. Do not rub the wound.  · Skin adhesive strips fall off on their own. You can trim the strips as the wound heals. Do not remove any strips that are still stuck to the wound. They will  fall off after a while.  If your doctor used wound glue:  · Try to keep your wound dry, but you may briefly wet it in the shower or bath. Do not soak the wound in water, such as by swimming.  · After you take a shower or a bath, gently pat the wound dry with a clean towel. Do not rub the wound.  · Do not do any activities that will make you really sweaty until the skin glue has fallen off on its own.  · Do not apply liquid, cream, or ointment medicine to your wound while the skin glue is still on.  · If you were given a bandage, you should change it at least one time per day or as told by your doctor. You should also change it if it gets dirty or wet.  · If a bandage is placed over the wound, do not let the tape for the bandage touch the skin glue.  · Do not pick at the glue. The skin glue usually stays on for 5-10 days. Then, it   falls off of the skin.  General Instructions   · To help prevent scarring, make sure to cover your wound with sunscreen whenever you are outside after stitches are removed, after adhesive strips are removed, or when wound glue stays in place and the wound is healed. Make sure to wear a sunscreen of at least 30 SPF.  · Take over-the-counter and prescription medicines only as told by your doctor.  · If you were given antibiotic medicine or ointment, take or apply it as told by your doctor. Do not stop using the antibiotic even if your wound is getting better.  · Do not scratch or pick at the wound.  · Keep all follow-up visits as told by your doctor. This is important.  · Check your wound every day for signs of infection. Watch for:    Redness, swelling, or pain.    Fluid, blood, or pus.  · Raise (elevate) the injured area above the level of your heart while you are sitting or lying down, if possible.  GET HELP IF:  · You got a tetanus shot and you have any of these problems at the injection site:    Swelling.    Very bad pain.    Redness.    Bleeding.  · You have a fever.  · A wound that was  closed breaks open.  · You notice a bad smell coming from your wound or your bandage.  · You notice something coming out of the wound, such as wood or glass.  · Medicine does not help your pain.  · You have more redness, swelling, or pain at the site of your wound.  · You have fluid, blood, or pus coming from your wound.  · You notice a change in the color of your skin near your wound.  · You need to change the bandage often because fluid, blood, or pus is coming from the wound.  · You start to have a new rash.  · You start to have numbness around the wound.  GET HELP RIGHT AWAY IF:  · You have very bad swelling around the wound.  · Your pain suddenly gets worse and is very bad.  · You notice painful lumps near the wound or on skin that is anywhere on your body.  · You have a red streak going away from your wound.  · The wound is on your hand or foot and you cannot move a finger or toe like you usually can.  · The wound is on your hand or foot and you notice that your fingers or toes look pale or bluish.     This information is not intended to replace advice given to you by your health care provider. Make sure you discuss any questions you have with your health care provider.     Document Released: 05/18/2008 Document Revised: 04/16/2015 Document Reviewed: 11/26/2014  Elsevier Interactive Patient Education ©2016 Elsevier Inc[employer]

## 2016-01-09 NOTE — ED Notes (Signed)
Patient presents to ED with laceration to LEFT hand - in between 1st and 2nd digits. Patient cut with a bread knife.

## 2016-01-14 DIAGNOSIS — N390 Urinary tract infection, site not specified: Secondary | ICD-10-CM | POA: Diagnosis not present

## 2016-01-14 DIAGNOSIS — G5603 Carpal tunnel syndrome, bilateral upper limbs: Secondary | ICD-10-CM | POA: Diagnosis not present

## 2016-01-14 DIAGNOSIS — G43019 Migraine without aura, intractable, without status migrainosus: Secondary | ICD-10-CM | POA: Diagnosis not present

## 2016-01-15 DIAGNOSIS — M25562 Pain in left knee: Secondary | ICD-10-CM | POA: Diagnosis not present

## 2016-01-15 DIAGNOSIS — M6281 Muscle weakness (generalized): Secondary | ICD-10-CM | POA: Diagnosis not present

## 2016-01-15 DIAGNOSIS — S61412A Laceration without foreign body of left hand, initial encounter: Secondary | ICD-10-CM | POA: Diagnosis not present

## 2016-01-15 DIAGNOSIS — M25561 Pain in right knee: Secondary | ICD-10-CM | POA: Diagnosis not present

## 2016-01-17 ENCOUNTER — Encounter: Payer: Self-pay | Admitting: Emergency Medicine

## 2016-01-17 ENCOUNTER — Emergency Department
Admission: EM | Admit: 2016-01-17 | Discharge: 2016-01-17 | Disposition: A | Discharge status: Home / Self Care | Payer: Medicare HMO | Attending: Emergency Medicine | Admitting: Emergency Medicine

## 2016-01-17 DIAGNOSIS — Z91040 Latex allergy status: Secondary | ICD-10-CM | POA: Insufficient documentation

## 2016-01-17 DIAGNOSIS — S6992XD Unspecified injury of left wrist, hand and finger(s), subsequent encounter: Secondary | ICD-10-CM | POA: Diagnosis present

## 2016-01-17 DIAGNOSIS — Z79899 Other long term (current) drug therapy: Secondary | ICD-10-CM | POA: Insufficient documentation

## 2016-01-17 DIAGNOSIS — S61412D Laceration without foreign body of left hand, subsequent encounter: Secondary | ICD-10-CM | POA: Diagnosis not present

## 2016-01-17 DIAGNOSIS — Z5189 Encounter for other specified aftercare: Secondary | ICD-10-CM

## 2016-01-17 DIAGNOSIS — X58XXXD Exposure to other specified factors, subsequent encounter: Secondary | ICD-10-CM | POA: Diagnosis not present

## 2016-01-17 DIAGNOSIS — Z4801 Encounter for change or removal of surgical wound dressing: Secondary | ICD-10-CM | POA: Insufficient documentation

## 2016-01-17 DIAGNOSIS — Z4800 Encounter for change or removal of nonsurgical wound dressing: Secondary | ICD-10-CM | POA: Diagnosis not present

## 2016-01-17 MED ORDER — CEPHALEXIN 500 MG PO CAPS
500.0000 mg | ORAL_CAPSULE | Freq: Once | ORAL | Status: AC
  Administered 2016-01-17: 500 mg via ORAL
  Filled 2016-01-17: qty 1

## 2016-01-17 MED ORDER — CEPHALEXIN 500 MG PO CAPS: 500.0000 mg | ORAL_CAPSULE | Freq: Three times a day (TID) | ORAL | Status: DC

## 2016-01-17 MED ORDER — BACITRACIN ZINC 500 UNIT/GM EX OINT
TOPICAL_OINTMENT | Freq: Once | CUTANEOUS | Status: AC
  Administered 2016-01-17: 18:00:00 via TOPICAL
  Filled 2016-01-17: qty 0.9

## 2016-01-17 NOTE — ED Notes (Signed)
Pt had stiches placed in her hand Thursday a week ago, had them removed this past Wed, site now with small bloody ooze, no s/s of infection noted.

## 2016-01-17 NOTE — Discharge Instructions (Signed)
Wound Care Taking care of your wound properly can help to prevent pain and infection. It can also help your wound to heal more quickly.  HOW TO CARE FOR YOUR WOUND  Take or apply over-the-counter and prescription medicines only as told by your health care provider.  If you were prescribed antibiotic medicine, take or apply it as told by your health care provider. Do not stop using the antibiotic even if your condition improves.  Clean the wound each day or as told by your health care provider.  Wash the wound with mild soap and water.  Rinse the wound with water to remove all soap.  Pat the wound dry with a clean towel. Do not rub it.  There are many different ways to close and cover a wound. For example, a wound can be covered with stitches (sutures), skin glue, or adhesive strips. Follow instructions from your health care provider about:  How to take care of your wound.  When and how you should change your bandage (dressing).  When you should remove your dressing.  Removing whatever was used to close your wound.  Check your wound every day for signs of infection. Watch for:  Redness, swelling, or pain.  Fluid, blood, or pus.  Keep the dressing dry until your health care provider says it can be removed. Do not take baths, swim, use a hot tub, or do anything that would put your wound underwater until your health care provider approves.  Raise (elevate) the injured area above the level of your heart while you are sitting or lying down.  Do not scratch or pick at the wound.  Keep all follow-up visits as told by your health care provider. This is important. SEEK MEDICAL CARE IF:  You received a tetanus shot and you have swelling, severe pain, redness, or bleeding at the injection site.  You have a fever.  Your pain is not controlled with medicine.  You have increased redness, swelling, or pain at the site of your wound.  You have fluid, blood, or pus coming from your  wound.  You notice a bad smell coming from your wound or your dressing. SEEK IMMEDIATE MEDICAL CARE IF:  You have a red streak going away from your wound.   This information is not intended to replace advice given to you by your health care provider. Make sure you discuss any questions you have with your health care provider.   Document Released: 09/08/2008 Document Revised: 04/16/2015 Document Reviewed: 11/26/2014 Elsevier Interactive Patient Education [employer]   Take antibiotics as directed. Wash wound daily. Cover with antibiotic ointment daily. Return in approximately 3 days for wound recheck, if not improving. Return sooner if any signs of worsening.

## 2016-01-17 NOTE — ED Notes (Signed)
Had a laceration repair on Jan 26th  Then states she had them removed feb 1st..area is open slightly and oozing

## 2016-01-17 NOTE — ED Provider Notes (Signed)
[facility] Emergency Department Provider Note  ____________________________________________  Time seen: Approximately 6:14 PM  I have reviewed the triage vital signs and the nursing notes.   HISTORY  Chief Complaint Hand Injury    HPI GRACEMARIE YONKE is a 40 y.o. female who suffered a laceration on January 26 with a ER visit and repair with sutures. Her sutures were removedon February 1 by her place of employment. She has noticed oozing from the wound with some redness as well. She still has tenderness at the wound site. No fevers and chills. She is currently on prednisone for knee pain.   Past Medical History  Diagnosis Date  . Chicken pox   . GERD (gastroesophageal reflux disease)   . Frequent headaches   . Allergy   . Bee sting allergy 03/02/2015    Patient Active Problem List   Diagnosis Date Noted  . Bee sting allergy 03/02/2015  . Obesity (BMI 30-39.9) 01/02/2015  . Gastroesophageal reflux disease without esophagitis 12/28/2014  . Seasonal allergies 12/28/2014  . Frequent headaches 12/28/2014    Past Surgical History  Procedure Laterality Date  . Tonsillectomy    . Adenoidectomy      Current Outpatient Rx  Name  Route  Sig  Dispense  Refill  . cephALEXin (KEFLEX) 500 MG capsule   Oral   Take 1 capsule (500 mg total) by mouth 3 (three) times daily.   21 capsule   0   . levocetirizine (XYZAL) 5 MG tablet      TAKE ONE TABLET BY MOUTH IN THE EVENING   30 tablet   0   . omeprazole (PRILOSEC) 40 MG capsule      TAKE ONE CAPSULE BY MOUTH ONCE DAILY   30 capsule   0   . terbinafine (LAMISIL) 250 MG tablet   Oral   Take 1 tablet (250 mg total) by mouth daily.   30 tablet   0   . topiramate (TOPAMAX) 100 MG tablet      TAKE ONE TABLET BY MOUTH AT BEDTIME         . traMADol (ULTRAM) 50 MG tablet   Oral   Take 1 tablet (50 mg total) by mouth every 6 (six) hours as needed for moderate pain.   12 tablet   0      Allergies Bee venom and Latex  Family History  Problem Relation Age of Onset  . Stroke Mother   . Mental retardation Mother   . Cancer Maternal Grandmother   . Cancer Paternal Grandmother     Breast  . Heart disease Paternal Grandfather     Social History Social History  Substance Use Topics  . Smoking status: Never Smoker   . Smokeless tobacco: None  . Alcohol Use: No    Review of Systems Constitutional: No fever/chills Eyes: No visual changes. ENT: No sore throat. Cardiovascular: Denies chest pain. Respiratory: Denies shortness of breath. Gastrointestinal: No abdominal pain.  No nausea, no vomiting.  No diarrhea.  No constipation. Genitourinary: Negative for dysuria. Musculoskeletal: Negative for back pain. Skin: Negative for rash. Neurological: Negative for headaches, focal weakness or numbness. 10-point ROS otherwise negative.  ____________________________________________   PHYSICAL EXAM:  VITAL SIGNS: ED Triage Vitals  Enc Vitals Group     BP 01/17/16 1737 123/85 mmHg     Pulse Rate 01/17/16 1737 79     Resp 01/17/16 1737 18     Temp 01/17/16 1737 97.5 F (36.4 C)  Temp Source 01/17/16 1737 Oral     SpO2 --      Weight 01/17/16 1737 245 lb (111.131 kg)     Height 01/17/16 1737  (1.753 m)     Head Cir --      Peak Flow --      Pain Score --      Pain Loc --      Pain Edu? --      Excl. in GC? --     Constitutional: Alert and oriented. Well appearing and in no acute distress. Eyes: Conjunctivae are normal. PERRL. EOMI. Ears:  Clear with normal landmarks. No erythema. Head: Atraumatic. Nose: No congestion/rhinnorhea. Mouth/Throat: Mucous membranes are moist.  Oropharynx non-erythematous. No lesions. Neck:  Supple.  No adenopathy.   Cardiovascular: Normal rate, regular rhythm. Grossly normal heart sounds.  Good peripheral circulation. Respiratory: Normal respiratory effort.  No retractions. Lungs CTAB. Gastrointestinal: Soft and  nontender. No distention. No abdominal bruits. No CVA tenderness. Musculoskeletal: Nml ROM of upper and lower extremity joints. Neurologic:  Normal speech and language. No gross focal neurologic deficits are appreciated. No gait instability. Skin:  Laceration to the left thenar fossa with small amount of drainage noted, one drop, but no gaping noted.  Unable to palpate any pockets of infection.  She does have erythema along the edges of the wound. Psychiatric: Mood and affect are normal. Speech and behavior are normal.  ____________________________________________   LABS (all labs ordered are listed, but only abnormal results are displayed)  Labs Reviewed - No data to display ____________________________________________  EKG    ____________________________________________  RADIOLOGY    ____________________________________________   PROCEDURES  Procedure(s) performed: None  Critical Care performed: No  ____________________________________________   INITIAL IMPRESSION / ASSESSMENT AND PLAN / ED COURSE  Pertinent labs & imaging results that were available during my care of the patient were reviewed by me and considered in my medical decision making (see chart for details).  40 year old who returns for a wound check. Her sutures were removed at an outside clinic on  February first. She has had some drainage from the site. Given Keflex 500 3 times a day for 1 week. She will change dressing daily and return in 3 days for wound recheck, or sooner for any worsening symptoms. ____________________________________________   FINAL CLINICAL IMPRESSION(S) / ED DIAGNOSES  Final diagnoses:  Encounter for wound re-check      Ignacia Bayley, PA-C 01/17/16 1819  Arnaldo Natal, MD 01/17/16 [id number]

## 2016-01-20 DIAGNOSIS — J301 Allergic rhinitis due to pollen: Secondary | ICD-10-CM | POA: Diagnosis not present

## 2016-01-22 DIAGNOSIS — M6281 Muscle weakness (generalized): Secondary | ICD-10-CM | POA: Diagnosis not present

## 2016-01-22 DIAGNOSIS — M25562 Pain in left knee: Secondary | ICD-10-CM | POA: Diagnosis not present

## 2016-01-22 DIAGNOSIS — M25561 Pain in right knee: Secondary | ICD-10-CM | POA: Diagnosis not present

## 2016-01-24 DIAGNOSIS — M25562 Pain in left knee: Secondary | ICD-10-CM | POA: Diagnosis not present

## 2016-01-24 DIAGNOSIS — M25561 Pain in right knee: Secondary | ICD-10-CM | POA: Diagnosis not present

## 2016-01-24 DIAGNOSIS — M6281 Muscle weakness (generalized): Secondary | ICD-10-CM | POA: Diagnosis not present

## 2016-01-29 DIAGNOSIS — M25561 Pain in right knee: Secondary | ICD-10-CM | POA: Diagnosis not present

## 2016-01-29 DIAGNOSIS — M6281 Muscle weakness (generalized): Secondary | ICD-10-CM | POA: Diagnosis not present

## 2016-01-29 DIAGNOSIS — M25562 Pain in left knee: Secondary | ICD-10-CM | POA: Diagnosis not present

## 2016-02-03 DIAGNOSIS — M25562 Pain in left knee: Secondary | ICD-10-CM | POA: Diagnosis not present

## 2016-02-03 DIAGNOSIS — M6281 Muscle weakness (generalized): Secondary | ICD-10-CM | POA: Diagnosis not present

## 2016-02-03 DIAGNOSIS — M25561 Pain in right knee: Secondary | ICD-10-CM | POA: Diagnosis not present

## 2016-02-05 DIAGNOSIS — J301 Allergic rhinitis due to pollen: Secondary | ICD-10-CM | POA: Diagnosis not present

## 2016-02-05 DIAGNOSIS — M25562 Pain in left knee: Secondary | ICD-10-CM | POA: Diagnosis not present

## 2016-02-05 DIAGNOSIS — M25561 Pain in right knee: Secondary | ICD-10-CM | POA: Diagnosis not present

## 2016-02-05 DIAGNOSIS — M6281 Muscle weakness (generalized): Secondary | ICD-10-CM | POA: Diagnosis not present

## 2016-02-06 ENCOUNTER — Ambulatory Visit: Payer: Medicare HMO | Admitting: Internal Medicine

## 2016-02-06 ENCOUNTER — Ambulatory Visit (INDEPENDENT_AMBULATORY_CARE_PROVIDER_SITE_OTHER): Payer: Medicare HMO | Admitting: Internal Medicine

## 2016-02-06 ENCOUNTER — Encounter: Payer: Self-pay | Admitting: Internal Medicine

## 2016-02-06 VITALS — BP 122/84 | HR 74 | Temp 98.4°F | Wt 235.0 lb

## 2016-02-06 DIAGNOSIS — J069 Acute upper respiratory infection, unspecified: Secondary | ICD-10-CM

## 2016-02-06 DIAGNOSIS — B9789 Other viral agents as the cause of diseases classified elsewhere: Principal | ICD-10-CM

## 2016-02-06 MED ORDER — BENZONATATE 200 MG PO CAPS: 200.0000 mg | ORAL_CAPSULE | Freq: Two times a day (BID) | ORAL | Status: DC | PRN

## 2016-02-06 NOTE — Patient Instructions (Signed)
Upper Respiratory Infection, Adult Most upper respiratory infections (URIs) are a viral infection of the air passages leading to the lungs. A URI affects the nose, throat, and upper air passages. The most common type of URI is nasopharyngitis and is typically referred to as "the common cold." URIs run their course and usually go away on their own. Most of the time, a URI does not require medical attention, but sometimes a bacterial infection in the upper airways can follow a viral infection. This is called a secondary infection. Sinus and middle ear infections are common types of secondary upper respiratory infections. Bacterial pneumonia can also complicate a URI. A URI can worsen asthma and chronic obstructive pulmonary disease (COPD). Sometimes, these complications can require emergency medical care and may be life threatening.  CAUSES Almost all URIs are caused by viruses. A virus is a type of germ and can spread from one person to another.  RISKS FACTORS You may be at risk for a URI if:   You smoke.   You have chronic heart or lung disease.  You have a weakened defense (immune) system.   You are very young or very old.   You have nasal allergies or asthma.  You work in crowded or poorly ventilated areas.  You work in health care facilities or schools. SIGNS AND SYMPTOMS  Symptoms typically develop 2-3 days after you come in contact with a cold virus. Most viral URIs last 7-10 days. However, viral URIs from the influenza virus (flu virus) can last 14-18 days and are typically more severe. Symptoms may include:   Runny or stuffy (congested) nose.   Sneezing.   Cough.   Sore throat.   Headache.   Fatigue.   Fever.   Loss of appetite.   Pain in your forehead, behind your eyes, and over your cheekbones (sinus pain).  Muscle aches.  DIAGNOSIS  Your health care provider may diagnose a URI by:  Physical exam.  Tests to check that your symptoms are not due to  another condition such as:  Strep throat.  Sinusitis.  Pneumonia.  Asthma. TREATMENT  A URI goes away on its own with time. It cannot be cured with medicines, but medicines may be prescribed or recommended to relieve symptoms. Medicines may help:  Reduce your fever.  Reduce your cough.  Relieve nasal congestion. HOME CARE INSTRUCTIONS   Take medicines only as directed by your health care provider.   Gargle warm saltwater or take cough drops to comfort your throat as directed by your health care provider.  Use a warm mist humidifier or inhale steam from a shower to increase air moisture. This may make it easier to breathe.  Drink enough fluid to keep your urine clear or pale yellow.   Eat soups and other clear broths and maintain good nutrition.   Rest as needed.   Return to work when your temperature has returned to normal or as your health care provider advises. You may need to stay home longer to avoid infecting others. You can also use a face mask and careful hand washing to prevent spread of the virus.  Increase the usage of your inhaler if you have asthma.   Do not use any tobacco products, including cigarettes, chewing tobacco, or electronic cigarettes. If you need help quitting, ask your health care provider. PREVENTION  The best way to protect yourself from getting a cold is to practice good hygiene.   Avoid oral or hand contact with people with cold   symptoms.   Wash your hands often if contact occurs.  There is no clear evidence that vitamin C, vitamin E, echinacea, or exercise reduces the chance of developing a cold. However, it is always recommended to get plenty of rest, exercise, and practice good nutrition.  SEEK MEDICAL CARE IF:   You are getting worse rather than better.   Your symptoms are not controlled by medicine.   You have chills.  You have worsening shortness of breath.  You have brown or red mucus.  You have yellow or brown nasal  discharge.  You have pain in your face, especially when you bend forward.  You have a fever.  You have swollen neck glands.  You have pain while swallowing.  You have white areas in the back of your throat. SEEK IMMEDIATE MEDICAL CARE IF:   You have severe or persistent:  Headache.  Ear pain.  Sinus pain.  Chest pain.  You have chronic lung disease and any of the following:  Wheezing.  Prolonged cough.  Coughing up blood.  A change in your usual mucus.  You have a stiff neck.  You have changes in your:  Vision.  Hearing.  Thinking.  Mood. MAKE SURE YOU:   Understand these instructions.  Will watch your condition.  Will get help right away if you are not doing well or get worse.   This information is not intended to replace advice given to you by your health care provider. Make sure you discuss any questions you have with your health care provider.   Document Released: 05/26/2001 Document Revised: 04/16/2015 Document Reviewed: 03/07/2014 Elsevier Interactive Patient Education 2016 Elsevier Inc.  

## 2016-02-06 NOTE — Progress Notes (Signed)
HPI  Pt presents to the clinic today with c/o runny nose, sore throat and cough. This started 1 week ago. She is blowing clear, blood tinged mucous out of her nose. She has had some difficulty swallowing. The cough is productive of clear mucous. She denies fever, chills or body aches. She has tried chloraseptic spray with some relief. She does have a history of seasonal allergies. She has not had sick contacts.  Review of Systems      Past Medical History  Diagnosis Date  . Chicken pox   . GERD (gastroesophageal reflux disease)   . Frequent headaches   . Allergy   . Bee sting allergy 03/02/2015    Family History  Problem Relation Age of Onset  . Stroke Mother   . Mental retardation Mother   . Cancer Maternal Grandmother   . Cancer Paternal Grandmother     Breast  . Heart disease Paternal Grandfather     Social History   Social History  . Marital Status: Married    Spouse Name: N/A  . Number of Children: N/A  . Years of Education: N/A   Occupational History  . Not on file.   Social History Main Topics  . Smoking status: Never Smoker   . Smokeless tobacco: Not on file  . Alcohol Use: No  . Drug Use: No  . Sexual Activity: Yes    Birth Control/ Protection: None   Other Topics Concern  . Not on file   Social History Narrative    Allergies  Allergen Reactions  . Bee Venom Itching  . Latex Rash    The powder in the gloves causes rash     Constitutional: Positive headache. Denies fatigue, fever or abrupt weight changes.  HEENT:  Positive runny nose, sore throat. Denies eye redness, eye pain, pressure behind the eyes, facial pain, nasal congestion, ear pain, ringing in the ears, wax buildup, or bloody nose. Respiratory: Positive cough. Denies difficulty breathing or shortness of breath.  Cardiovascular: Denies chest pain, chest tightness, palpitations or swelling in the hands or feet.   No other specific complaints in a complete review of systems (except as  listed in HPI above).  Objective:   BP 122/84 mmHg  Pulse 74  Temp(Src) 98.4 F (36.9 C) (Oral)  Wt 235 lb (106.595 kg)  SpO2 98%  LMP 12/30/2015  Wt Readings from Last 3 Encounters:  02/06/16 235 lb (106.595 kg)  01/17/16 245 lb (111.131 kg)  01/09/16 250 lb (113.399 kg)     General: Appears her stated age, obese in NAD. HEENT: Head: normal shape and size, no sinus tenderness noted; Eyes: sclera white, no icterus, conjunctiva pink; Ears: Tm's gray and intact, normal light reflex; Nose: mucosa pink and moist, septum midline; Throat/Mouth: + PND. Teeth present, mucosa erythematous and moist, no exudate noted, no lesions or ulcerations noted.  Neck: Cervical lymphadenopathy, R>L.  Cardiovascular: Normal rate and rhythm. S1,S2 noted.  No murmur, rubs or gallops noted.  Pulmonary/Chest: Normal effort and positive vesicular breath sounds. No respiratory distress. No wheezes, rales or ronchi noted.      Assessment & Plan:   Viral Upper Respiratory Infection with Cough:  Get some rest and drink plenty of water Do salt water gargles for the sore throat Ibuprofen as needed for inflammation eRx for Tessalon Pearls Continue Xyzal  RTC as needed or if symptoms persist.

## 2016-02-06 NOTE — Progress Notes (Signed)
Pre visit review using our clinic review tool, if applicable. No additional management support is needed unless otherwise documented below in the visit note. 

## 2016-02-07 ENCOUNTER — Telehealth: Payer: Self-pay

## 2016-02-07 DIAGNOSIS — B9789 Other viral agents as the cause of diseases classified elsewhere: Principal | ICD-10-CM

## 2016-02-07 DIAGNOSIS — J069 Acute upper respiratory infection, unspecified: Secondary | ICD-10-CM

## 2016-02-07 MED ORDER — BENZONATATE 200 MG PO CAPS: 200.0000 mg | ORAL_CAPSULE | Freq: Two times a day (BID) | ORAL | Status: DC | PRN

## 2016-02-07 NOTE — Telephone Encounter (Signed)
Kathy Gamble [phone]  [facility] in [place] -- Yesterday when she was in she said the wrong pharmacy. She needs her medicine sent to [facility] in [place]

## 2016-02-07 NOTE — Telephone Encounter (Signed)
Rx sent to [facility]

## 2016-02-10 ENCOUNTER — Telehealth: Payer: Self-pay

## 2016-02-10 DIAGNOSIS — J301 Allergic rhinitis due to pollen: Secondary | ICD-10-CM | POA: Diagnosis not present

## 2016-02-10 MED ORDER — ALBUTEROL SULFATE HFA 108 (90 BASE) MCG/ACT IN AERS: 1.0000 | INHALATION_SPRAY | Freq: Four times a day (QID) | RESPIRATORY_TRACT | Status: DC | PRN

## 2016-02-10 NOTE — Telephone Encounter (Signed)
Melaine-  I know she was given a sample inhaler, but she does not have a history of asthma or COPD. Why is she using the inhaler? She should not be using that for allergies.

## 2016-02-10 NOTE — Telephone Encounter (Signed)
Kathy Gamble left Kathy Gamble; pt was seen 02/06/16 and pt thought advair diskus was going to be sent to [facility] pharmacy.Please advise. Kathy Gamble request cb to him or the pt.Please advise.

## 2016-02-10 NOTE — Telephone Encounter (Signed)
Pt is aware--Rx sent to pharmacy per verbal okay from [facility]

## 2016-02-10 NOTE — Addendum Note (Signed)
Addended by: Roena Malady on: 02/10/2016 02:57 PM   Modules accepted: Orders

## 2016-02-10 NOTE — Telephone Encounter (Signed)
She can have an Albuterol if that something she needs for chest tightness and wheezing.

## 2016-02-10 NOTE — Telephone Encounter (Signed)
Spoke to pt and she states the other Dr she was seeing gave it to her and just told her she could use it when she needs it---asked pt had she been Dx with asthma etc and she stated that she had not---explained to pt that this something that should used on a regular basis by a pt with asthma or COPD--pt expressed understanding

## 2016-02-18 DIAGNOSIS — J0141 Acute recurrent pansinusitis: Secondary | ICD-10-CM | POA: Diagnosis not present

## 2016-02-18 DIAGNOSIS — F0781 Postconcussional syndrome: Secondary | ICD-10-CM | POA: Diagnosis not present

## 2016-02-18 DIAGNOSIS — G43719 Chronic migraine without aura, intractable, without status migrainosus: Secondary | ICD-10-CM | POA: Diagnosis not present

## 2016-02-18 DIAGNOSIS — J301 Allergic rhinitis due to pollen: Secondary | ICD-10-CM | POA: Diagnosis not present

## 2016-02-18 DIAGNOSIS — R200 Anesthesia of skin: Secondary | ICD-10-CM | POA: Diagnosis not present

## 2016-02-21 DIAGNOSIS — M25561 Pain in right knee: Secondary | ICD-10-CM | POA: Diagnosis not present

## 2016-02-21 DIAGNOSIS — M6281 Muscle weakness (generalized): Secondary | ICD-10-CM | POA: Diagnosis not present

## 2016-02-21 DIAGNOSIS — M25562 Pain in left knee: Secondary | ICD-10-CM | POA: Diagnosis not present

## 2016-02-26 DIAGNOSIS — M6281 Muscle weakness (generalized): Secondary | ICD-10-CM | POA: Diagnosis not present

## 2016-02-26 DIAGNOSIS — M25562 Pain in left knee: Secondary | ICD-10-CM | POA: Diagnosis not present

## 2016-02-26 DIAGNOSIS — M25561 Pain in right knee: Secondary | ICD-10-CM | POA: Diagnosis not present

## 2016-02-29 ENCOUNTER — Other Ambulatory Visit: Payer: Self-pay | Admitting: Internal Medicine

## 2016-03-02 NOTE — Telephone Encounter (Signed)
Last filled 02/07/16--please advise 

## 2016-03-04 DIAGNOSIS — M6281 Muscle weakness (generalized): Secondary | ICD-10-CM | POA: Diagnosis not present

## 2016-03-04 DIAGNOSIS — M25561 Pain in right knee: Secondary | ICD-10-CM | POA: Diagnosis not present

## 2016-03-04 DIAGNOSIS — M25562 Pain in left knee: Secondary | ICD-10-CM | POA: Diagnosis not present

## 2016-03-06 DIAGNOSIS — M25561 Pain in right knee: Secondary | ICD-10-CM | POA: Diagnosis not present

## 2016-03-06 DIAGNOSIS — M6281 Muscle weakness (generalized): Secondary | ICD-10-CM | POA: Diagnosis not present

## 2016-03-06 DIAGNOSIS — M25562 Pain in left knee: Secondary | ICD-10-CM | POA: Diagnosis not present

## 2016-03-11 DIAGNOSIS — M25561 Pain in right knee: Secondary | ICD-10-CM | POA: Diagnosis not present

## 2016-03-11 DIAGNOSIS — M25562 Pain in left knee: Secondary | ICD-10-CM | POA: Diagnosis not present

## 2016-03-11 DIAGNOSIS — M6281 Muscle weakness (generalized): Secondary | ICD-10-CM | POA: Diagnosis not present

## 2016-03-16 ENCOUNTER — Encounter: Payer: Self-pay | Admitting: Internal Medicine

## 2016-03-16 ENCOUNTER — Ambulatory Visit (INDEPENDENT_AMBULATORY_CARE_PROVIDER_SITE_OTHER): Payer: Commercial Managed Care - HMO | Admitting: Internal Medicine

## 2016-03-16 VITALS — BP 118/78 | HR 76 | Temp 98.4°F | Wt 234.0 lb

## 2016-03-16 DIAGNOSIS — J301 Allergic rhinitis due to pollen: Secondary | ICD-10-CM

## 2016-03-16 NOTE — Patient Instructions (Signed)
Allergic Rhinitis Allergic rhinitis is when the mucous membranes in the nose respond to allergens. Allergens are particles in the air that cause your body to have an allergic reaction. This causes you to release allergic antibodies. Through a chain of events, these eventually cause you to release histamine into the blood stream. Although meant to protect the body, it is this release of histamine that causes your discomfort, such as frequent sneezing, congestion, and an itchy, runny nose.  CAUSES Seasonal allergic rhinitis (hay fever) is caused by pollen allergens that may come from grasses, trees, and weeds. Year-round allergic rhinitis (perennial allergic rhinitis) is caused by allergens such as house dust mites, pet dander, and mold spores. SYMPTOMS  Nasal stuffiness (congestion).  Itchy, runny nose with sneezing and tearing of the eyes. DIAGNOSIS Your health care provider can help you determine the allergen or allergens that trigger your symptoms. If you and your health care provider are unable to determine the allergen, skin or blood testing may be used. Your health care provider will diagnose your condition after taking your health history and performing a physical exam. Your health care provider may assess you for other related conditions, such as asthma, pink eye, or an ear infection. TREATMENT Allergic rhinitis does not have a cure, but it can be controlled by:  Medicines that block allergy symptoms. These may include allergy shots, nasal sprays, and oral antihistamines.  Avoiding the allergen. Hay fever may often be treated with antihistamines in pill or nasal spray forms. Antihistamines block the effects of histamine. There are over-the-counter medicines that may help with nasal congestion and swelling around the eyes. Check with your health care provider before taking or giving this medicine. If avoiding the allergen or the medicine prescribed do not work, there are many new medicines  your health care provider can prescribe. Stronger medicine may be used if initial measures are ineffective. Desensitizing injections can be used if medicine and avoidance does not work. Desensitization is when a patient is given ongoing shots until the body becomes less sensitive to the allergen. Make sure you follow up with your health care provider if problems continue. HOME CARE INSTRUCTIONS It is not possible to completely avoid allergens, but you can reduce your symptoms by taking steps to limit your exposure to them. It helps to know exactly what you are allergic to so that you can avoid your specific triggers. SEEK MEDICAL CARE IF:  You have a fever.  You develop a cough that does not stop easily (persistent).  You have shortness of breath.  You start wheezing.  Symptoms interfere with normal daily activities.   This information is not intended to replace advice given to you by your health care provider. Make sure you discuss any questions you have with your health care provider.   Document Released: 08/25/2001 Document Revised: 12/21/2014 Document Reviewed: 08/07/2013 Elsevier Interactive Patient Education 2016 Elsevier Inc.  

## 2016-03-16 NOTE — Progress Notes (Signed)
Pre visit review using our clinic review tool, if applicable. No additional management support is needed unless otherwise documented below in the visit note. 

## 2016-03-16 NOTE — Progress Notes (Addendum)
HPI  Pt presents today with c/o of headache, facial pain and pressure, nasal congestion, and cough. This started 1 week ago. The headache is located in her forehead, just above her eyes. She is blowing blood tinget yellow mucous out of her nose. She denies fever,chillls or body aches. She has a h/o allergies, currently taking Xyzal every am with some relief. She occasionally takes Saline nasal spray with some relief. She has not had sick contacts.   Review of Systems    Past Medical History  Diagnosis Date  . Chicken pox   . GERD (gastroesophageal reflux disease)   . Frequent headaches   . Allergy   . Bee sting allergy 03/02/2015    Family History  Problem Relation Age of Onset  . Stroke Mother   . Mental retardation Mother   . Cancer Maternal Grandmother   . Cancer Paternal Grandmother     Breast  . Heart disease Paternal Grandfather     Social History   Social History  . Marital Status: Married    Spouse Name: N/A  . Number of Children: N/A  . Years of Education: N/A   Occupational History  . Not on file.   Social History Main Topics  . Smoking status: Never Smoker   . Smokeless tobacco: Not on file  . Alcohol Use: No  . Drug Use: No  . Sexual Activity: Yes    Birth Control/ Protection: None   Other Topics Concern  . Not on file   Social History Narrative    Allergies  Allergen Reactions  . Bee Venom Itching  . Latex Rash    The powder in the gloves causes rash     Constitutional: Positive headache. Denies fatigue, fever or abrupt weight changes.  HEENT:  Positive eye pain, facial pain, nasal congestion. Denies sore throat eye redness, ear pain, ringing in the ears, wax buildup, runny nose or bloody nose. Respiratory: Positive cough. Denies difficulty breathing or shortness of breath.  Cardiovascular: Denies chest pain, chest tightness, palpitations or swelling in the hands or feet.   No other specific complaints in a complete review of systems  (except as listed in HPI above).  Objective:   BP 118/78 mmHg  Pulse 76  Temp(Src) 98.4 F (36.9 C) (Oral)  Wt 234 lb (106.142 kg)  SpO2 99%  General: Appears her stated age, well developed, well nourished in NAD. HEENT: Head: normal shape and size, mild maxillary and frontal sinus tenderness noted; Eyes: sclera white, no icterus, conjunctiva pink; Ears: Tm's gray and intact with sclerosis, normal light reflex; Nose: mucosa boggy and moist, septum midline; Throat/Mouth: + PND. Teeth present, mucosa pink and moist, no exudate noted, no lesions or ulcerations noted.  Neck:  No masses, lumps or thyromegaly present.  Cardiovascular: Normal rate and rhythm. S1,S2 noted.  No murmur, rubs or gallops noted.  Pulmonary/Chest: Normal effort and positive vesicular breath sounds. No respiratory distress. No wheezes, rales or ronchi noted.      Assessment & Plan:   Allergic rhinitis:  Continue Xyzal use Add Claritin at night x 10 days Use saline nasal spray PRN Use Tessalon Pearls prn  RTC as needed or if symptoms persist.  BAITY, REGINA

## 2016-03-18 DIAGNOSIS — R200 Anesthesia of skin: Secondary | ICD-10-CM | POA: Diagnosis not present

## 2016-04-01 DIAGNOSIS — M79641 Pain in right hand: Secondary | ICD-10-CM | POA: Diagnosis not present

## 2016-04-01 DIAGNOSIS — M79642 Pain in left hand: Secondary | ICD-10-CM | POA: Diagnosis not present

## 2016-04-07 ENCOUNTER — Other Ambulatory Visit: Payer: Self-pay

## 2016-04-07 MED ORDER — OMEPRAZOLE 40 MG PO CPDR: 40.0000 mg | DELAYED_RELEASE_CAPSULE | Freq: Every day | ORAL | Status: DC

## 2016-04-07 NOTE — Telephone Encounter (Signed)
prilosec refilled, griseofluvin was a one time medication

## 2016-04-07 NOTE — Telephone Encounter (Signed)
[facility] left v/m requesting refill griseofulvin 500 mg (last filled # 30 on 07/30/15)and omeprazole last refilled #30 on 11/25/15. Pt last annual exam on 12/2014. No future appt scheduled.

## 2016-04-10 DIAGNOSIS — Z124 Encounter for screening for malignant neoplasm of cervix: Secondary | ICD-10-CM | POA: Diagnosis not present

## 2016-04-10 DIAGNOSIS — G43019 Migraine without aura, intractable, without status migrainosus: Secondary | ICD-10-CM | POA: Diagnosis not present

## 2016-04-10 DIAGNOSIS — J301 Allergic rhinitis due to pollen: Secondary | ICD-10-CM | POA: Diagnosis not present

## 2016-04-10 DIAGNOSIS — H7493 Unspecified disorder of middle ear and mastoid, bilateral: Secondary | ICD-10-CM | POA: Diagnosis not present

## 2016-04-10 DIAGNOSIS — R87615 Unsatisfactory cytologic smear of cervix: Secondary | ICD-10-CM | POA: Diagnosis not present

## 2016-04-10 DIAGNOSIS — Z0001 Encounter for general adult medical examination with abnormal findings: Secondary | ICD-10-CM | POA: Diagnosis not present

## 2016-04-10 DIAGNOSIS — R5383 Other fatigue: Secondary | ICD-10-CM | POA: Diagnosis not present

## 2016-04-13 ENCOUNTER — Ambulatory Visit: Payer: Commercial Managed Care - HMO | Attending: Orthopedic Surgery | Admitting: Occupational Therapy

## 2016-04-13 DIAGNOSIS — M79642 Pain in left hand: Secondary | ICD-10-CM

## 2016-04-13 DIAGNOSIS — R202 Paresthesia of skin: Secondary | ICD-10-CM | POA: Diagnosis not present

## 2016-04-13 DIAGNOSIS — M79641 Pain in right hand: Secondary | ICD-10-CM | POA: Diagnosis not present

## 2016-04-13 NOTE — Therapy (Signed)
[facility] [address], [place], [address] Phone: [phone]   Fax:  [phone]  Occupational Therapy Treatment  Patient Details  Name: Kathy Gamble MRN: [id number] Date of Birth: 08-02-1976 Referring Provider: Martha Clan  Encounter Date: 04/13/2016      OT End of Session - 04/13/16 1737    Visit Number 1   Number of Visits 4   Date for OT Re-Evaluation 05/11/16   OT Start Time 0947   OT Stop Time 1044   OT Time Calculation (min) 57 min   Activity Tolerance Other (comment);Patient tolerated treatment well   Behavior During Therapy [facility] for tasks assessed/performed      Past Medical History  Diagnosis Date  . Chicken pox   . GERD (gastroesophageal reflux disease)   . Frequent headaches   . Allergy   . Bee sting allergy 03/02/2015    Past Surgical History  Procedure Laterality Date  . Tonsillectomy    . Adenoidectomy      There were no vitals filed for this visit.      Subjective Assessment - 04/13/16 1000    Subjective  Pins and needles as well as in hands started more than 5 months ago - pins and needles  first and then pain - middle 3 fingers worse than others and L hand worse than R    Patient Stated Goals Get better    Currently in Pain? Yes   Pain Location Finger (Comment which one)   Pain Orientation Right;Left   Pain Descriptors / Indicators Tingling            [facility] OT Assessment - 04/13/16 0001    Assessment   Diagnosis Bilateral hand pain    Referring Provider Martha Clan   Onset Date 05/15/15   Precautions   Required Braces or Orthoses Other Brace/Splint   Other Brace/Splint been wearing wrist spllints for years    Balance Screen   Has the patient fallen in the past 6 months No   Home  Environment   Lives With Family   Prior Function   Level of Independence Independent   Vocation On disability   Leisure on disablitly - on table playing games, house work R hand  dominant    AROM   Right Wrist Extension 75 Degrees   Right Wrist Flexion 90 Degrees   Right Wrist Radial Deviation 20 Degrees   Right Wrist Ulnar Deviation 30 Degrees   Left Wrist Extension 74 Degrees   Left Wrist Flexion 90 Degrees   Left Wrist Radial Deviation 20 Degrees   Left Wrist Ulnar Deviation 30 Degrees   Strength   Right Hand Grip (lbs) 45   Right Hand Lateral Pinch 16 lbs   Right Hand 3 Point Pinch 16 lbs   Left Hand Grip (lbs) 40   Left Hand Lateral Pinch 18 lbs   Left Hand 3 Point Pinch 17 lbs          See pt instruction - reviewed with pt                 OT Education - 04/13/16 1736    Education provided Yes   Education Details activities modications ,and HEP - splint wearing    Person(s) Educated Patient   Methods Explanation;Demonstration;Tactile cues;Verbal cues;Handout   Comprehension Need further instruction;Returned demonstration;Verbalized understanding;Verbal cues required          OT Short Term Goals - 04/13/16 1744  OT SHORT TERM GOAL #1   Title Pt's pain/tingling  improve in bilateral hands to l3/10 at the best   Baseline at the worse 7/10 and best 5/10    Time 3   Period Weeks   Status New           OT Long Term Goals - Apr 26, 2016 1745    OT LONG TERM GOAL #1   Title Pt to be ind in HEP for decrease pain and CT symptoms    Baseline symptoms constant but worsen as day goes    Time 4   Period Weeks   Status New   OT LONG TERM GOAL #2   Title Grip strenght in bilateral hands improve with 5 lbs    Baseline see flowsheet    Time 4   Period Weeks   Status New               Plan - 26-Apr-2016 1738    Clinical Impression Statement Pt refer to OT for hand pain - pt report having it more than 6 months - starts with tingling or pins and needles in middle 3 digits and then pain - and worse as the day goes - she is wearing her husband old wrist splints without metal bar -  she use to had  CT years ago when she was using  computer -  - she was positive for Tinel and Phalens but was negative for CT  on  nerve conduction test - pt had hard time following directions for HEP - will review again next session    Rehab Potential Fair   Clinical Impairments Affecting Rehab Potential Ability to follow directions    OT Frequency 1x / week   OT Duration 4 weeks   OT Treatment/Interventions Self-care/ADL training;Contrast Bath;DME and/or AE instruction;Splinting;Patient/family education;Therapeutic exercises;Manual Therapy   Plan Assess how doing with HEP , symptoms with wearing splints ?    OT Home Exercise Plan See pt instruction   Consulted and Agree with Plan of Care Patient      Patient will benefit from skilled therapeutic intervention in order to improve the following deficits and impairments:  Decreased strength, Impaired UE functional use, Impaired sensation, Pain, Decreased knowledge of use of DME, Decreased knowledge of precautions  Visit Diagnosis: Pain in left hand - Plan: Ot plan of care cert/re-cert  Pain in right hand - Plan: Ot plan of care cert/re-cert  Paresthesia of both hands - Plan: Ot plan of care cert/re-cert      G-Codes - 04-26-16 1748    Functional Assessment Tool Used ROM , grip and prehension , sensation , pain , Elvia Collum, clinical judgement   Functional Limitation Self care   Self Care Current Status [phone]) At least 20 percent but less than 40 percent impaired, limited or restricted   Self Care Goal Status ([id number]) At least 1 percent but less than 20 percent impaired, limited or restricted      Problem List Patient Active Problem List   Diagnosis Date Noted  . Bee sting allergy 03/02/2015  . Obesity (BMI 30-39.9) 01/02/2015  . Gastroesophageal reflux disease without esophagitis 12/28/2014  . Seasonal allergies 12/28/2014  . Frequent headaches 12/28/2014    Oletta Cohn OTR/L,CLT Apr 26, 2016, 5:53 PM   [facility] PHYS[facility] AND SPORTS  MEDICINE [address] [place], [address] Phone: [phone]   Fax:  [phone]  Name: Kathy Gamble MRN: [id number] Date of Birth: 17-Feb-1976

## 2016-04-13 NOTE — Patient Instructions (Signed)
Fitted with Wrist splints that fits her - was using husband xtra large splints   Pt to do contrast  And then tendon glides 8 reps  Med N glides - 3-5 reps  2 x day ]]  Ed on activities to modify and adapt - splint wearing

## 2016-04-20 ENCOUNTER — Ambulatory Visit: Payer: Commercial Managed Care - HMO | Admitting: Occupational Therapy

## 2016-04-20 ENCOUNTER — Other Ambulatory Visit: Payer: Self-pay | Admitting: Internal Medicine

## 2016-04-20 DIAGNOSIS — R202 Paresthesia of skin: Secondary | ICD-10-CM | POA: Diagnosis not present

## 2016-04-20 DIAGNOSIS — M79642 Pain in left hand: Secondary | ICD-10-CM | POA: Diagnosis not present

## 2016-04-20 DIAGNOSIS — M79641 Pain in right hand: Secondary | ICD-10-CM | POA: Diagnosis not present

## 2016-04-20 NOTE — Patient Instructions (Addendum)
Cont same HEP for ROM and splint wearing   Add Med N glides  5 reps  Needed mod A and Max  Verbal cues

## 2016-04-20 NOTE — Therapy (Signed)
[facility] MEDICINE [address], [place], [address] Phone: [phone]   Fax:  [phone]  Occupational Therapy Treatment  Patient Details  Name: Kathy Gamble MRN: [id number] Date of Birth: 1976-02-29 Referring Provider: Martha Clan  Encounter Date: 04/20/2016      OT End of Session - 04/20/16 0827    Visit Number 2   Number of Visits 4   Date for OT Re-Evaluation 05/11/16   OT Start Time 0805   OT Stop Time 0846   OT Time Calculation (min) 41 min   Activity Tolerance Other (comment);Patient tolerated treatment well   Behavior During Therapy [facility] for tasks assessed/performed      Past Medical History  Diagnosis Date  . Chicken pox   . GERD (gastroesophageal reflux disease)   . Frequent headaches   . Allergy   . Bee sting allergy 03/02/2015    Past Surgical History  Procedure Laterality Date  . Tonsillectomy    . Adenoidectomy      There were no vitals filed for this visit.      Subjective Assessment - 04/20/16 0823    Subjective  Pins and needles not contant and splint let it feels better - did wear the splints all the time only off with bathing and dresssing- found tick on me yesterday    Patient Stated Goals Get better    Currently in Pain? Yes   Pain Score 1    Pain Orientation Right;Left   Pain Descriptors / Indicators Pins and needles                      OT Treatments/Exercises (OP) - 04/20/16 0001    RUE Contrast Bath   Time 11 minutes   Comments at [facility] to decrease CTS   LUE Contrast Bath   Time 11 minutes   Comments decrease CTS at [facility]       SOft tissue mobs after contrast - Graston tools 3 and 4 - with sweeping, scooping and brushing on volar wrist and forearm - to decrease CT symptoms and tightness - R worse than L and proximal more than over CT   Followed by PROM for wrist and digits composite extention   Reviewed HEP - tendon glides 8 reps  Needed max A verbal  cues   has handout   Add Med N glides  5 reps  Needed mod A and Max  Verbal cues           OT Education - 04/20/16 0827    Education provided Yes   Education Details HEP   Person(s) Educated Patient   Methods Explanation;Demonstration;Tactile cues;Verbal cues   Comprehension Verbal cues required;Returned demonstration;Verbalized understanding          OT Short Term Goals - 04/13/16 1744    OT SHORT TERM GOAL #1   Title Pt's pain/tingling  improve in bilateral hands to l3/10 at the best   Baseline at the worse 7/10 and best 5/10    Time 3   Period Weeks   Status New           OT Long Term Goals - 04/13/16 1745    OT LONG TERM GOAL #1   Title Pt to be ind in HEP for decrease pain and CT symptoms    Baseline symptoms constant but worsen as day goes    Time 4   Period Weeks   Status New   OT LONG  TERM GOAL #2   Title Grip strenght in bilateral hands improve with 5 lbs    Baseline see flowsheet    Time 4   Period Weeks   Status New               Plan - 04/20/16 1414    Clinical Impression Statement Pt report that she is still feeling pins and needles in middle 3 fingers - but pain better and splints fitting better - pt had hard time with exericese - reviewed even with demo, pictures and hands ons - will review again    Rehab Potential Fair   Clinical Impairments Affecting Rehab Potential Ability to follow directions    OT Frequency 1x / week   OT Duration 4 weeks   OT Treatment/Interventions Self-care/ADL training;Contrast Bath;DME and/or AE instruction;Splinting;Patient/family education;Therapeutic exercises;Manual Therapy   Plan assess progress and if able to do HEP correctly    OT Home Exercise Plan See pt instruction   Consulted and Agree with Plan of Care Patient      Patient will benefit from skilled therapeutic intervention in order to improve the following deficits and impairments:  Decreased strength, Impaired UE functional use, Impaired  sensation, Pain, Decreased knowledge of use of DME, Decreased knowledge of precautions  Visit Diagnosis: Pain in left hand  Pain in right hand  Paresthesia of both hands    Problem List Patient Active Problem List   Diagnosis Date Noted  . Bee sting allergy 03/02/2015  . Obesity (BMI 30-39.9) 01/02/2015  . Gastroesophageal reflux disease without esophagitis 12/28/2014  . Seasonal allergies 12/28/2014  . Frequent headaches 12/28/2014    Oletta Cohn OTR/L,CLT 04/20/2016, 2:16 PM  [facility] MEDICINE [address] [place], [address] Phone: [phone]   Fax:  [phone]  Name: Kathy Gamble MRN: [id number] Date of Birth: 1976/04/16

## 2016-04-21 NOTE — Telephone Encounter (Signed)
Received refill electronically Last refill 03/02/16 #30/1 Last office visit 03/16/16

## 2016-04-27 ENCOUNTER — Ambulatory Visit: Payer: Commercial Managed Care - HMO | Admitting: Occupational Therapy

## 2016-04-30 DIAGNOSIS — H6523 Chronic serous otitis media, bilateral: Secondary | ICD-10-CM | POA: Diagnosis not present

## 2016-04-30 DIAGNOSIS — J301 Allergic rhinitis due to pollen: Secondary | ICD-10-CM | POA: Diagnosis not present

## 2016-05-04 ENCOUNTER — Ambulatory Visit: Payer: Commercial Managed Care - HMO | Admitting: Occupational Therapy

## 2016-05-04 DIAGNOSIS — R202 Paresthesia of skin: Secondary | ICD-10-CM

## 2016-05-04 DIAGNOSIS — M79641 Pain in right hand: Secondary | ICD-10-CM | POA: Diagnosis not present

## 2016-05-04 DIAGNOSIS — M79642 Pain in left hand: Secondary | ICD-10-CM | POA: Diagnosis not present

## 2016-05-04 NOTE — Therapy (Signed)
[facility] MEDICINE [address], [place], [address] Phone: [phone]   Fax:  [phone]  Occupational Therapy Treatment  Patient Details  Name: Kathy Gamble MRN: [id number] Date of Birth: 07-27-1976 Referring Provider: Martha Clan  Encounter Date: 05/04/2016      OT End of Session - 05/04/16 1334    Visit Number 3   Number of Visits 4   Date for OT Re-Evaluation 05/11/16   OT Start Time 1315   OT Stop Time 1355   OT Time Calculation (min) 40 min   Activity Tolerance Other (comment);Patient tolerated treatment well   Behavior During Therapy [facility] for tasks assessed/performed      Past Medical History  Diagnosis Date  . Chicken pox   . GERD (gastroesophageal reflux disease)   . Frequent headaches   . Allergy   . Bee sting allergy 03/02/2015    Past Surgical History  Procedure Laterality Date  . Tonsillectomy    . Adenoidectomy      There were no vitals filed for this visit.      Subjective Assessment - 05/04/16 1331    Subjective  My hands are no numb since  I was watching tv - I think I was sitting with my wrist hanging over the  armrest - I am doing the heat and ice during day - do some beads and use to do potholder    Patient Stated Goals Get better    Currently in Pain? Yes   Pain Score 1    Pain Location Wrist   Pain Orientation Left;Right   Pain Descriptors / Indicators Aching                      OT Treatments/Exercises (OP) - 05/04/16 0001    RUE Contrast Bath   Time 11 minutes   Comments at [facility] to decrease numbness    LUE Contrast Bath   Time 11 minutes   Comments at [facility] to decrease numbness     After contrast Graston tools for tool 3 and 5 for sweeping , scooping on volar forearm - and nr 2 on palm and CT  Some redness and grinding feel over CT and forearm  Pt report numbness still in L hand - did do some cervical flexion - and with lateral flexion to L pt report  numbness increase - but will have PT to do cervical screen next session   Prayer stretch for wrist extention  Done and add to HEP  8 reps  Needed mod v/c  Pt to do after contrast and prior to other HEP   Pt ed and reviewed again modifications of tasks to decrease CT               OT Education - 05/04/16 1334    Education provided Yes   Education Details HEP and modifications    Person(s) Educated Patient   Methods Explanation;Tactile cues;Demonstration;Verbal cues   Comprehension Returned demonstration;Verbalized understanding          OT Short Term Goals - 04/13/16 1744    OT SHORT TERM GOAL #1   Title Pt's pain/tingling  improve in bilateral hands to l3/10 at the best   Baseline at the worse 7/10 and best 5/10    Time 3   Period Weeks   Status New           OT Long Term Goals - 04/13/16 1745  OT LONG TERM GOAL #1   Title Pt to be ind in HEP for decrease pain and CT symptoms    Baseline symptoms constant but worsen as day goes    Time 4   Period Weeks   Status New   OT LONG TERM GOAL #2   Title Grip strenght in bilateral hands improve with 5 lbs    Baseline see flowsheet    Time 4   Period Weeks   Status New               Plan - 05/04/16 1428    Clinical Impression Statement Pt arrive this date with increase numbness in bilateral hands while watching tv in waiting room - pt with contrast and Graston R hand got better but L hand stil  numb - did some cervical screen and pt report increase numbness with lateral flexion to L - ? cervical issue - will have PT do cervical screen next session - report again with pt  modifications to tasks and avoid wrist flexion with grip -    Rehab Potential Fair   Clinical Impairments Affecting Rehab Potential Ability to follow directions    OT Frequency 1x / week   OT Duration 4 weeks   OT Treatment/Interventions Self-care/ADL training;Contrast Bath;DME and/or AE instruction;Splinting;Patient/family  education;Therapeutic exercises;Manual Therapy   Plan assess progress and have PT do cervical screen   OT Home Exercise Plan See pt instruction   Consulted and Agree with Plan of Care Patient      Patient will benefit from skilled therapeutic intervention in order to improve the following deficits and impairments:  Decreased strength, Impaired UE functional use, Impaired sensation, Pain, Decreased knowledge of use of DME, Decreased knowledge of precautions  Visit Diagnosis: Pain in left hand  Pain in right hand  Paresthesia of both hands    Problem List Patient Active Problem List   Diagnosis Date Noted  . Bee sting allergy 03/02/2015  . Obesity (BMI 30-39.9) 01/02/2015  . Gastroesophageal reflux disease without esophagitis 12/28/2014  . Seasonal allergies 12/28/2014  . Frequent headaches 12/28/2014    Oletta Cohn OTR/L,CLT 05/04/2016, 2:33 PM  [facility] PH[facility]TS MEDICINE [address], [place], [address] Phone: [phone]   Fax:  [phone]  Name: Kathy Gamble MRN: [id number] Date of Birth: [birth year]

## 2016-05-04 NOTE — Patient Instructions (Signed)
did do some cervical flexion - and with lateral flexion to L pt report numbness increase - but will have PT to do cervical screen next session   Prayer stretch for wrist extention   add to HEP  8 reps  Needed mod v/c  Pt to do after contrast and prior to other HEP   Pt ed and reviewed again modifications of tasks to decrease CT

## 2016-05-13 ENCOUNTER — Ambulatory Visit: Payer: Commercial Managed Care - HMO | Admitting: Occupational Therapy

## 2016-05-28 DIAGNOSIS — J301 Allergic rhinitis due to pollen: Secondary | ICD-10-CM | POA: Diagnosis not present

## 2016-06-15 DIAGNOSIS — G43019 Migraine without aura, intractable, without status migrainosus: Secondary | ICD-10-CM | POA: Diagnosis not present

## 2016-06-15 DIAGNOSIS — L89899 Pressure ulcer of other site, unspecified stage: Secondary | ICD-10-CM | POA: Diagnosis not present

## 2016-06-15 DIAGNOSIS — R030 Elevated blood-pressure reading, without diagnosis of hypertension: Secondary | ICD-10-CM | POA: Diagnosis not present

## 2016-06-15 DIAGNOSIS — J069 Acute upper respiratory infection, unspecified: Secondary | ICD-10-CM | POA: Diagnosis not present

## 2016-06-15 DIAGNOSIS — J309 Allergic rhinitis, unspecified: Secondary | ICD-10-CM | POA: Diagnosis not present

## 2016-07-14 DIAGNOSIS — S90829S Blister (nonthermal), unspecified foot, sequela: Secondary | ICD-10-CM | POA: Diagnosis not present

## 2016-07-14 DIAGNOSIS — Z733 Stress, not elsewhere classified: Secondary | ICD-10-CM | POA: Diagnosis not present

## 2016-07-14 DIAGNOSIS — G43019 Migraine without aura, intractable, without status migrainosus: Secondary | ICD-10-CM | POA: Diagnosis not present

## 2016-07-27 ENCOUNTER — Other Ambulatory Visit: Payer: Self-pay | Admitting: Internal Medicine

## 2016-08-10 ENCOUNTER — Other Ambulatory Visit: Payer: Self-pay | Admitting: Internal Medicine

## 2016-08-27 DIAGNOSIS — R062 Wheezing: Secondary | ICD-10-CM | POA: Diagnosis not present

## 2016-08-27 DIAGNOSIS — M25569 Pain in unspecified knee: Secondary | ICD-10-CM | POA: Diagnosis not present

## 2016-08-27 DIAGNOSIS — J069 Acute upper respiratory infection, unspecified: Secondary | ICD-10-CM | POA: Diagnosis not present

## 2016-08-27 DIAGNOSIS — J309 Allergic rhinitis, unspecified: Secondary | ICD-10-CM | POA: Diagnosis not present

## 2016-09-09 DIAGNOSIS — M170 Bilateral primary osteoarthritis of knee: Secondary | ICD-10-CM | POA: Diagnosis not present

## 2016-09-10 ENCOUNTER — Other Ambulatory Visit: Payer: Self-pay | Admitting: Internal Medicine

## 2016-09-23 DIAGNOSIS — M222X1 Patellofemoral disorders, right knee: Secondary | ICD-10-CM | POA: Diagnosis not present

## 2016-09-23 DIAGNOSIS — M25561 Pain in right knee: Secondary | ICD-10-CM | POA: Diagnosis not present

## 2016-09-23 DIAGNOSIS — M222X2 Patellofemoral disorders, left knee: Secondary | ICD-10-CM | POA: Diagnosis not present

## 2016-09-23 DIAGNOSIS — M25562 Pain in left knee: Secondary | ICD-10-CM | POA: Diagnosis not present

## 2016-09-28 DIAGNOSIS — M222X2 Patellofemoral disorders, left knee: Secondary | ICD-10-CM | POA: Diagnosis not present

## 2016-09-28 DIAGNOSIS — M25562 Pain in left knee: Secondary | ICD-10-CM | POA: Diagnosis not present

## 2016-09-28 DIAGNOSIS — M25561 Pain in right knee: Secondary | ICD-10-CM | POA: Diagnosis not present

## 2016-09-28 DIAGNOSIS — M222X1 Patellofemoral disorders, right knee: Secondary | ICD-10-CM | POA: Diagnosis not present

## 2016-10-06 DIAGNOSIS — M25562 Pain in left knee: Secondary | ICD-10-CM | POA: Diagnosis not present

## 2016-10-06 DIAGNOSIS — M6281 Muscle weakness (generalized): Secondary | ICD-10-CM | POA: Diagnosis not present

## 2016-10-06 DIAGNOSIS — M25561 Pain in right knee: Secondary | ICD-10-CM | POA: Diagnosis not present

## 2016-10-09 DIAGNOSIS — M25561 Pain in right knee: Secondary | ICD-10-CM | POA: Diagnosis not present

## 2016-10-09 DIAGNOSIS — M25562 Pain in left knee: Secondary | ICD-10-CM | POA: Diagnosis not present

## 2016-10-09 DIAGNOSIS — M6281 Muscle weakness (generalized): Secondary | ICD-10-CM | POA: Diagnosis not present

## 2016-10-22 DIAGNOSIS — J0141 Acute recurrent pansinusitis: Secondary | ICD-10-CM | POA: Diagnosis not present

## 2016-10-22 DIAGNOSIS — J301 Allergic rhinitis due to pollen: Secondary | ICD-10-CM | POA: Diagnosis not present

## 2016-10-22 DIAGNOSIS — R197 Diarrhea, unspecified: Secondary | ICD-10-CM | POA: Diagnosis not present

## 2016-11-09 DIAGNOSIS — R040 Epistaxis: Secondary | ICD-10-CM | POA: Diagnosis not present

## 2016-11-09 DIAGNOSIS — G43019 Migraine without aura, intractable, without status migrainosus: Secondary | ICD-10-CM | POA: Diagnosis not present

## 2016-11-09 DIAGNOSIS — J45909 Unspecified asthma, uncomplicated: Secondary | ICD-10-CM | POA: Diagnosis not present

## 2016-11-09 DIAGNOSIS — J309 Allergic rhinitis, unspecified: Secondary | ICD-10-CM | POA: Diagnosis not present

## 2016-11-18 DIAGNOSIS — J019 Acute sinusitis, unspecified: Secondary | ICD-10-CM | POA: Diagnosis not present

## 2016-12-16 IMAGING — CR DG FINGER MIDDLE 2+V*R*
2 series · 2 of 2 positions shown · non-contrast
Comparison: None

CLINICAL DATA: Direct trauma to the hand during a punching episode
with persistent pain in the middle finger, injury 2-3 weeks ago

EXAM:
RIGHT MIDDLE FINGER 2+V

[view not recorded (1 of 2)]
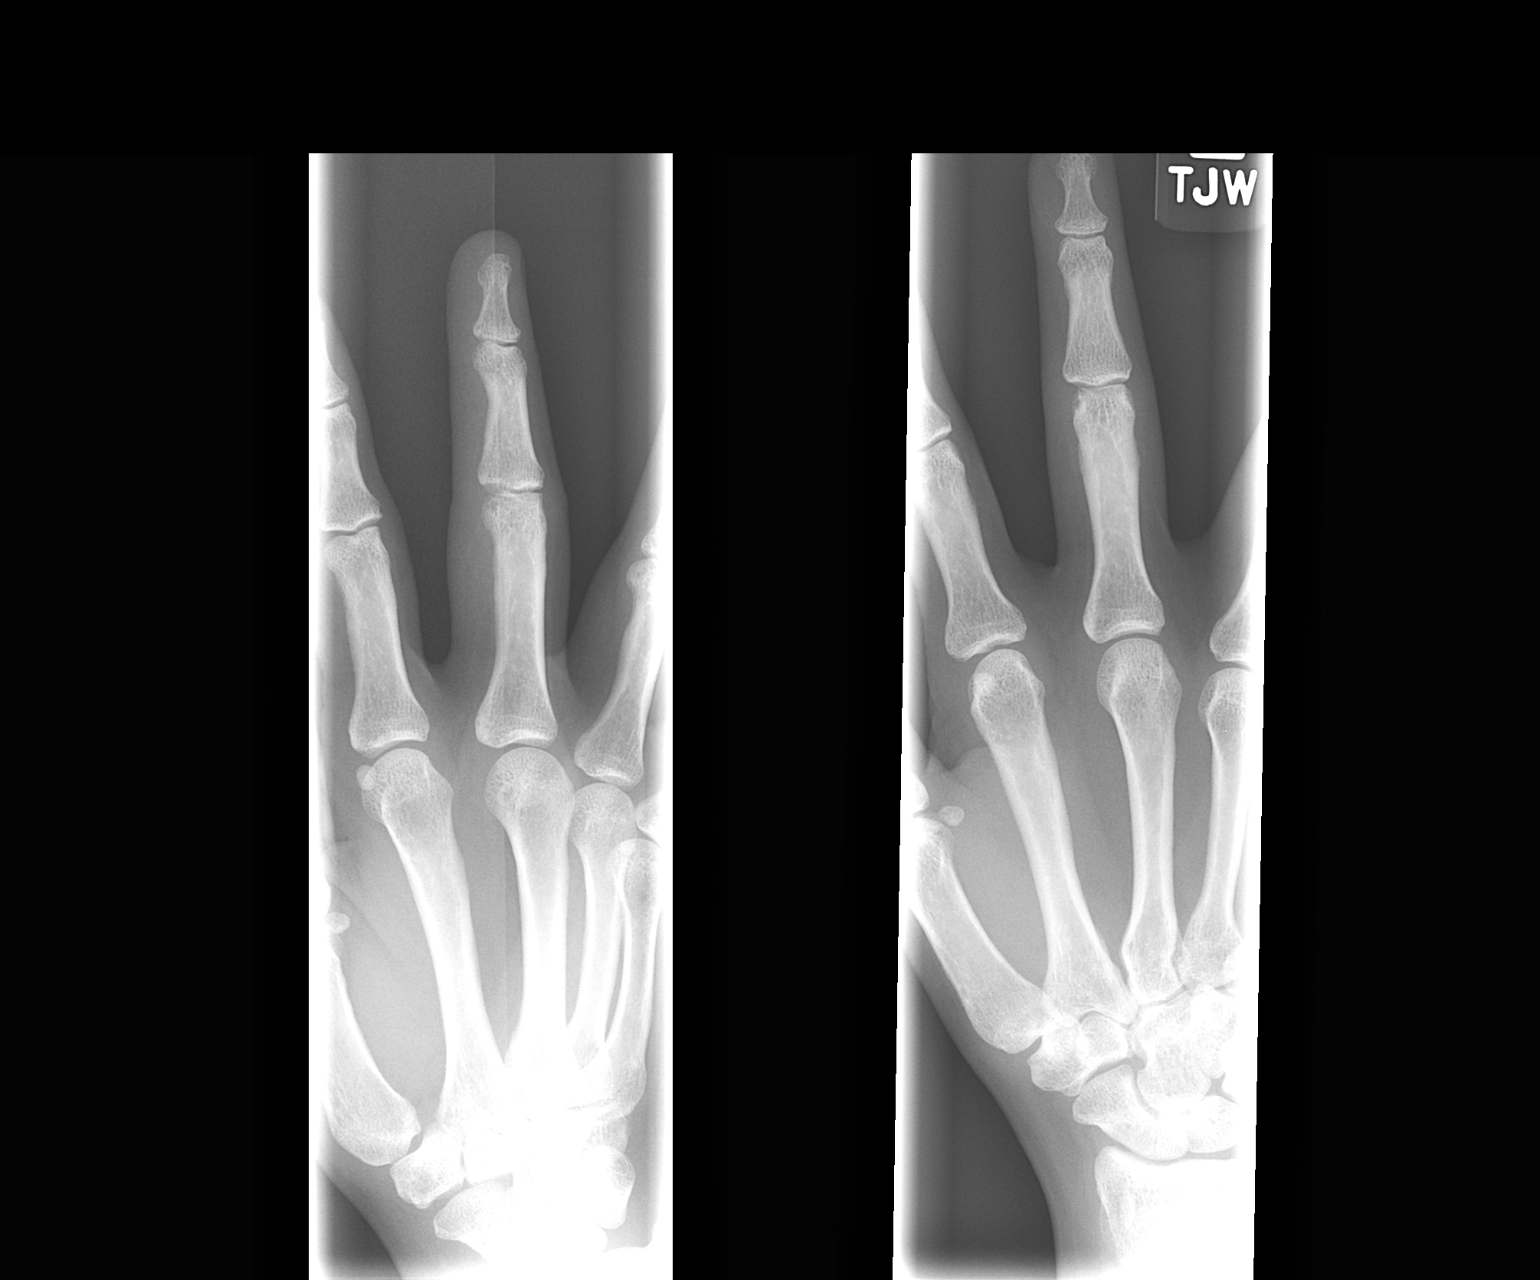

[view not recorded (2 of 2)]
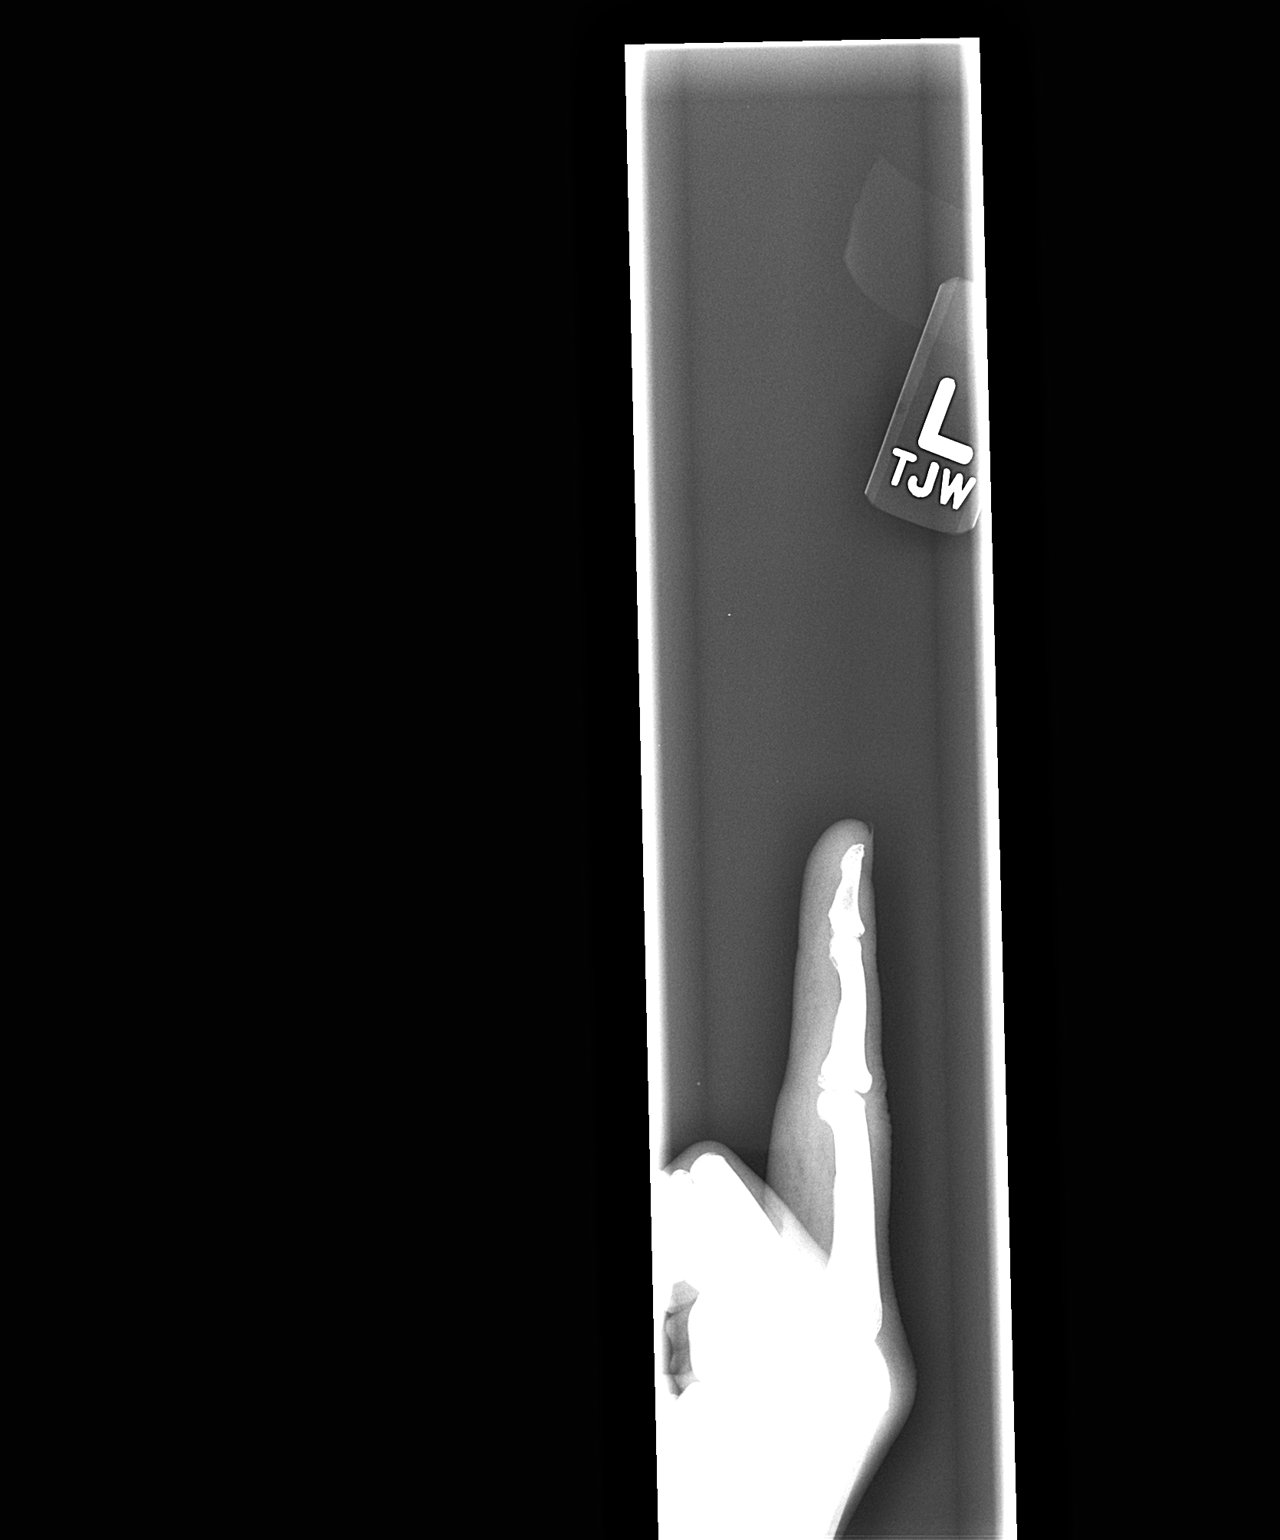

[2 of 2 positions shown; findings below may reference images not displayed]

FINDINGS: The bones of the third or long finger are adequately mineralized.
The joint spaces are preserved. There is mild soft tissue swelling
over the PIP joint. There is irregularity of the cortex of the
ventral aspect of the base of the middle phalanx. This may be the
site of a small avulsion fracture. The third metacarpal is intact
where visualized on the AP and oblique views.
IMPRESSION: A subtle avulsion fracture from the ventral aspect of the base of
the middle phalanx of the third finger is demonstrated. This may
involve the insertion of the flexor tendon.

## 2017-02-04 DIAGNOSIS — R05 Cough: Secondary | ICD-10-CM | POA: Diagnosis not present

## 2017-02-04 DIAGNOSIS — K219 Gastro-esophageal reflux disease without esophagitis: Secondary | ICD-10-CM | POA: Diagnosis not present

## 2017-02-04 DIAGNOSIS — J309 Allergic rhinitis, unspecified: Secondary | ICD-10-CM | POA: Diagnosis not present

## 2017-02-04 DIAGNOSIS — J0141 Acute recurrent pansinusitis: Secondary | ICD-10-CM | POA: Diagnosis not present

## 2017-04-19 DIAGNOSIS — J019 Acute sinusitis, unspecified: Secondary | ICD-10-CM | POA: Diagnosis not present

## 2017-04-19 DIAGNOSIS — J301 Allergic rhinitis due to pollen: Secondary | ICD-10-CM | POA: Diagnosis not present

## 2017-05-04 ENCOUNTER — Encounter: Payer: Self-pay | Admitting: Emergency Medicine

## 2017-05-04 ENCOUNTER — Emergency Department
Admission: EM | Admit: 2017-05-04 | Discharge: 2017-05-04 | Disposition: A | Discharge status: Home / Self Care | Payer: Medicare HMO | Attending: Emergency Medicine | Admitting: Emergency Medicine

## 2017-05-04 DIAGNOSIS — S300XXA Contusion of lower back and pelvis, initial encounter: Secondary | ICD-10-CM | POA: Diagnosis not present

## 2017-05-04 DIAGNOSIS — Y929 Unspecified place or not applicable: Secondary | ICD-10-CM | POA: Insufficient documentation

## 2017-05-04 DIAGNOSIS — Y939 Activity, unspecified: Secondary | ICD-10-CM | POA: Diagnosis not present

## 2017-05-04 DIAGNOSIS — Y999 Unspecified external cause status: Secondary | ICD-10-CM | POA: Diagnosis not present

## 2017-05-04 DIAGNOSIS — W19XXXA Unspecified fall, initial encounter: Secondary | ICD-10-CM

## 2017-05-04 DIAGNOSIS — S3992XA Unspecified injury of lower back, initial encounter: Secondary | ICD-10-CM | POA: Diagnosis present

## 2017-05-04 DIAGNOSIS — W182XXA Fall in (into) shower or empty bathtub, initial encounter: Secondary | ICD-10-CM | POA: Insufficient documentation

## 2017-05-04 DIAGNOSIS — S7011XA Contusion of right thigh, initial encounter: Secondary | ICD-10-CM | POA: Diagnosis not present

## 2017-05-04 DIAGNOSIS — M549 Dorsalgia, unspecified: Secondary | ICD-10-CM | POA: Diagnosis not present

## 2017-05-04 DIAGNOSIS — R58 Hemorrhage, not elsewhere classified: Secondary | ICD-10-CM

## 2017-05-04 NOTE — ED Provider Notes (Signed)
[facility] Emergency Department Provider Note  ____________________________________________  Time seen: Approximately 10:57 AM  I have reviewed the triage vital signs and the nursing notes.   HISTORY  Chief Complaint Fall    HPI Kathy Gamble is a 41 y.o. female that presents to the emergency department with back pain and bruise over right thigh after falling in the shower yesterday. Patient states that she was reaching down to get her shampoo when she lost her footing. Patient states that the bruise on her thigh has gotten worse throughout the day. She was told by somebody this morning that her bruise could indicate that she has lupus and is here and would like to be tested for lupus. She has been walking without difficulty. She denies hitting her head or losing consciousness. She does not take any blood thinners. No fever, shortness of breath, chest pain, nausea, vomiting, abdominal pain.   Past Medical History:  Diagnosis Date  . Allergy   . Bee sting allergy 03/02/2015  . Chicken pox   . Frequent headaches   . GERD (gastroesophageal reflux disease)     Patient Active Problem List   Diagnosis Date Noted  . Bee sting allergy 03/02/2015  . Obesity (BMI 30-39.9) 01/02/2015  . Gastroesophageal reflux disease without esophagitis 12/28/2014  . Seasonal allergies 12/28/2014  . Frequent headaches 12/28/2014    Past Surgical History:  Procedure Laterality Date  . ADENOIDECTOMY    . TONSILLECTOMY      Prior to Admission medications   Medication Sig Start Date End Date Taking? Authorizing Provider  albuterol (PROVENTIL HFA;VENTOLIN HFA) 108 (90 Base) MCG/ACT inhaler Inhale 1-2 puffs into the lungs every 6 (six) hours as needed for wheezing or shortness of breath. 02/10/16   Lorre Munroe, NP  benzonatate (TESSALON) 200 MG capsule TAKE ONE CAPSULE BY MOUTH TWICE DAILY ASNEEDED FOR COUGH 09/11/16   Lorre Munroe, NP  Fluticasone Furoate (ARNUITY ELLIPTA)  100 MCG/ACT AEPB Inhale 1 puff into the lungs 2 (two) times daily.    [provider]  levocetirizine (XYZAL) 5 MG tablet TAKE ONE TABLET BY MOUTH IN THE EVENING 09/24/15   Lorre Munroe, NP  omeprazole (PRILOSEC) 40 MG capsule TAKE ONE CAPSULE BY MOUTH DAILY 07/28/16   Lorre Munroe, NP  traMADol (ULTRAM) 50 MG tablet Take 1 tablet (50 mg total) by mouth every 6 (six) hours as needed for moderate pain. 01/09/16   Joni Reining, PA-C    Allergies Bee venom and Latex  Family History  Problem Relation Age of Onset  . Stroke Mother   . Mental retardation Mother   . Cancer Maternal Grandmother   . Cancer Paternal Grandmother        Breast  . Heart disease Paternal Grandfather     Social History Social History  Substance Use Topics  . Smoking status: Never Smoker  . Smokeless tobacco: Never Used  . Alcohol use No     Review of Systems  Constitutional: No fever/chills ENT: No upper respiratory complaints. Cardiovascular: No chest pain. Respiratory: No SOB. Gastrointestinal: No abdominal pain.  No nausea, no vomiting.  Musculoskeletal: Positive for back pain and thigh pain. Skin: Negative for rash, abrasions, lacerations. Positive for ecchymosis. Neurological: Negative for headaches, numbness or tingling   ____________________________________________   PHYSICAL EXAM:  VITAL SIGNS: ED Triage Vitals  Enc Vitals Group     BP 05/04/17 0824 128/87     Pulse Rate 05/04/17 0824 83  Resp 05/04/17 0824 20     Temp 05/04/17 0824 97.8 F (36.6 C)     Temp Source 05/04/17 0824 Oral     SpO2 05/04/17 0824 100 %     Weight 05/04/17 0825 234 lb (106.1 kg)     Height --      Head Circumference --      Peak Flow --      Pain Score --      Pain Loc --      Pain Edu? --      Excl. in GC? --      Constitutional: Alert and oriented. Well appearing and in no acute distress. Eyes: Conjunctivae are normal. PERRL. EOMI. Head: Atraumatic. ENT:      Ears:       Nose: No congestion/rhinnorhea.      Mouth/Throat: Mucous membranes are moist.  Neck: No stridor.  Cardiovascular: Normal rate, regular rhythm.  Good peripheral circulation. Respiratory: Normal respiratory effort without tachypnea or retractions. Lungs CTAB. Good air entry to the bases with no decreased or absent breath sounds. Gastrointestinal: Bowel sounds 4 quadrants. Soft and nontender to palpation. No guarding or rigidity. No palpable masses. No distention.  Musculoskeletal: Full range of motion to all extremities. No gross deformities appreciated. Diffuse tenderness to palpation over entire back. No focal tenderness over spine. Neurologic:  Normal speech and language. No gross focal neurologic deficits are appreciated.  Skin:  Skin is warm, dry and intact. 4 inch bruises over right thigh. 1 inch bruise over left knee.   ____________________________________________   LABS (all labs ordered are listed, but only abnormal results are displayed)  Labs Reviewed - No data to display ____________________________________________  EKG   ____________________________________________  RADIOLOGY  No results found.  ____________________________________________    PROCEDURES  Procedure(s) performed:    Procedures    Medications - No data to display   ____________________________________________   INITIAL IMPRESSION / ASSESSMENT AND PLAN / ED COURSE  Pertinent labs & imaging results that were available during my care of the patient were reviewed by me and considered in my medical decision making (see chart for details).  Review of the [place] CSRS was performed in accordance of the [facility] prior to dispensing any controlled drugs.   Patient's diagnosis is consistent with contusion and back pain. Vital signs and exam are reassuring. Patient fell in the shower yesterday morning. She has been walking since then without difficulty. She is here because she would like to be tested for  lupus. Education was provided about lupus and patient should follow up with PCP regarding this. I do not think there is indication for imaging at this time. Patient is to follow up with PCP as directed. Patient is given ED precautions to return to the ED for any worsening or new symptoms.     ____________________________________________  FINAL CLINICAL IMPRESSION(S) / ED DIAGNOSES  Final diagnoses:  Fall, initial encounter  Ecchymosis  Acute back pain, unspecified back location, unspecified back pain laterality      NEW MEDICATIONS STARTED DURING THIS VISIT:  Discharge Medication List as of 05/04/2017 11:22 AM          This chart was dictated using voice recognition software/Dragon. Despite best efforts to proofread, errors can occur which can change the meaning. Any change was purely unintentional.    Enid Derry, PA-C 05/04/17 1711    Emily Filbert, MD 05/05/17 [phone]

## 2017-05-04 NOTE — ED Notes (Signed)
NAD noted at time of D/C. Pt denies questions or concerns. Pt ambulatory to the lobby at this time.  

## 2017-05-04 NOTE — ED Triage Notes (Signed)
Pt fell in shower last night and now has low back pain and left upper thigh pain. Pt ambulatory to triage with cane with no difficulty noted.

## 2017-05-17 ENCOUNTER — Ambulatory Visit (INDEPENDENT_AMBULATORY_CARE_PROVIDER_SITE_OTHER): Payer: Medicare HMO | Admitting: Internal Medicine

## 2017-05-17 ENCOUNTER — Encounter: Payer: Self-pay | Admitting: Internal Medicine

## 2017-05-17 VITALS — BP 118/78 | HR 78 | Temp 97.9°F | Wt 219.0 lb

## 2017-05-17 DIAGNOSIS — W19XXXD Unspecified fall, subsequent encounter: Secondary | ICD-10-CM | POA: Diagnosis not present

## 2017-05-17 DIAGNOSIS — S7011XD Contusion of right thigh, subsequent encounter: Secondary | ICD-10-CM | POA: Diagnosis not present

## 2017-05-17 NOTE — Patient Instructions (Signed)
Contusion A contusion is a deep bruise. Contusions happen when an injury causes bleeding under the skin. Symptoms of bruising include pain, swelling, and discolored skin. The skin may turn blue, purple, or yellow. Follow these instructions at home:  Rest the injured area.  If told, put ice on the injured area. ? Put ice in a plastic bag. ? Place a towel between your skin and the bag. ? Leave the ice on for 20 minutes, 2-3 times per day.  If told, put light pressure (compression) on the injured area using an elastic bandage. Make sure the bandage is not too tight. Remove it and put it back on as told by your doctor.  If possible, raise (elevate) the injured area above the level of your heart while you are sitting or lying down.  Take over-the-counter and prescription medicines only as told by your doctor. Contact a doctor if:  Your symptoms do not get better after several days of treatment.  Your symptoms get worse.  You have trouble moving the injured area. Get help right away if:  You have very bad pain.  You have a loss of feeling (numbness) in a hand or foot.  Your hand or foot turns pale or cold. This information is not intended to replace advice given to you by your health care provider. Make sure you discuss any questions you have with your health care provider. Document Released: 05/18/2008 Document Revised: 05/07/2016 Document Reviewed: 04/17/2015 Elsevier Interactive Patient Education  2018 [employer]vier Inc[employer]  

## 2017-05-17 NOTE — Progress Notes (Signed)
Subjective:    Patient ID: Kathy Gamble, female    DOB: 03-27-1976, 41 y.o.   MRN: [id number]  HPI  Pt presents to the clinic today for hospital follow up. She went to the ER 05/04/17 with c/o a bruise on her thigh s/p fall in the shower. No imaging was done. She was not concerned about the trauma but more concerned that she has lupus. She reports a friend saw her bruise and advised her to get checked for lupus. They did not perform these test in the ER and advised her to follow up with her PCP. Since discharge, she reports the bruise is resolving. She reports minimal pain, and no difficulty walking. She continues to put ice and take Aleve and Tylenol with good relief.   Review of Systems      Past Medical History:  Diagnosis Date  . Allergy   . Bee sting allergy 03/02/2015  . Chicken pox   . Frequent headaches   . GERD (gastroesophageal reflux disease)     Current Outpatient Prescriptions  Medication Sig Dispense Refill  . albuterol (PROVENTIL HFA;VENTOLIN HFA) 108 (90 Base) MCG/ACT inhaler Inhale 1-2 puffs into the lungs every 6 (six) hours as needed for wheezing or shortness of breath. 1 Inhaler 1  . benzonatate (TESSALON) 200 MG capsule TAKE ONE CAPSULE BY MOUTH TWICE DAILY ASNEEDED FOR COUGH 30 capsule 0  . Fluticasone Furoate (ARNUITY ELLIPTA) 100 MCG/ACT AEPB Inhale 1 puff into the lungs 2 (two) times daily.    Marland Kitchen levocetirizine (XYZAL) 5 MG tablet TAKE ONE TABLET BY MOUTH IN THE EVENING 30 tablet 0  . omeprazole (PRILOSEC) 40 MG capsule TAKE ONE CAPSULE BY MOUTH DAILY 30 capsule 4  . traMADol (ULTRAM) 50 MG tablet Take 1 tablet (50 mg total) by mouth every 6 (six) hours as needed for moderate pain. 12 tablet 0   No current facility-administered medications for this visit.     Allergies  Allergen Reactions  . Bee Venom Itching  . Latex Rash    The powder in the gloves causes rash    Family History  Problem Relation Age of Onset  . Stroke Mother   . Mental retardation  Mother   . Cancer Maternal Grandmother   . Cancer Paternal Grandmother        Breast  . Heart disease Paternal Grandfather     Social History   Social History  . Marital status: Married    Spouse name: N/A  . Number of children: N/A  . Years of education: N/A   Occupational History  . Not on file.   Social History Main Topics  . Smoking status: Never Smoker  . Smokeless tobacco: Never Used  . Alcohol use No  . Drug use: No  . Sexual activity: Yes    Birth control/ protection: None   Other Topics Concern  . Not on file   Social History Narrative  . No narrative on file     Constitutional: Denies fever, malaise, fatigue, headache or abrupt weight changes.  Musculoskeletal: Denies decrease in range of motion, difficulty with gait, muscle pain or joint pain and swelling.  Skin: Pt reports bruise to anterior right thigh. Denies redness, rashes, lesions or ulcercations.    No other specific complaints in a complete review of systems (except as listed in HPI above).  Objective:   Physical Exam  BP 118/78   Pulse 78   Temp 97.9 F (36.6 C) (Oral)   Wt 219 lb (  99.3 kg)   SpO2 98%   BMI 32.34 kg/m  Wt Readings from Last 3 Encounters:  05/17/17 219 lb (99.3 kg)  05/04/17 234 lb (106.1 kg)  03/16/16 234 lb (106.1 kg)    General: Appears her  stated age, obese in NAD. Skin: Large resolving bruise noted of anterior right thigh. Musculoskeletal: Normal flexion and extension of the hip. No difficulty with gait.  BMET    Component Value Date/Time   NA 140 09/10/2015 1427   K 3.8 09/10/2015 1427   CL 109 09/10/2015 1427   CO2 26 09/10/2015 1427   GLUCOSE 96 09/10/2015 1427   BUN 12 09/10/2015 1427   CREATININE 0.88 09/10/2015 1427   CALCIUM 8.8 09/10/2015 1427   GFRNONAA >60 09/22/2010 1020   GFRAA  09/22/2010 1020    >60        The eGFR has been calculated using the MDRD equation. This calculation has not been validated in all  clinical situations. eGFR's persistently <60 mL/min signify possible Chronic Kidney Disease.    Lipid Panel     Component Value Date/Time   CHOL 121 01/02/2015 1518   TRIG 76.0 01/02/2015 1518   HDL 40.30 01/02/2015 1518   CHOLHDL 3 01/02/2015 1518   VLDL 15.2 01/02/2015 1518   LDLCALC 66 01/02/2015 1518    CBC    Component Value Date/Time   WBC 6.4 09/10/2015 1427   RBC 4.38 09/10/2015 1427   HGB 13.0 09/10/2015 1427   HCT 38.8 09/10/2015 1427   PLT 266.0 09/10/2015 1427   MCV 88.5 09/10/2015 1427   MCH 30.2 09/03/2011 1454   MCHC 33.5 09/10/2015 1427   RDW 13.1 09/10/2015 1427   LYMPHSABS 2.9 05/17/2015 1334   MONOABS 0.3 05/17/2015 1334   EOSABS 0.1 05/17/2015 1334   BASOSABS 0.0 05/17/2015 1334    Hgb A1C Lab Results  Component Value Date   HGBA1C 5.0 01/02/2015            Assessment & Plan:   ER follow up for Contusion of Right Thigh secondary to Fall:  ER notes reviewed Bruise is healing, no further intervention She did not mention testing for lupus, so I did not bring this up  Return precautions discussed Webb Silversmith, NP

## 2017-06-15 DIAGNOSIS — H5213 Myopia, bilateral: Secondary | ICD-10-CM | POA: Diagnosis not present

## 2017-06-30 ENCOUNTER — Ambulatory Visit (INDEPENDENT_AMBULATORY_CARE_PROVIDER_SITE_OTHER): Payer: Medicare HMO | Admitting: Internal Medicine

## 2017-06-30 ENCOUNTER — Encounter: Payer: Self-pay | Admitting: Internal Medicine

## 2017-06-30 VITALS — BP 124/80 | HR 74 | Temp 97.8°F | Ht 68.0 in | Wt 216.0 lb

## 2017-06-30 DIAGNOSIS — J301 Allergic rhinitis due to pollen: Secondary | ICD-10-CM

## 2017-06-30 DIAGNOSIS — R51 Headache: Secondary | ICD-10-CM | POA: Diagnosis not present

## 2017-06-30 DIAGNOSIS — K219 Gastro-esophageal reflux disease without esophagitis: Secondary | ICD-10-CM

## 2017-06-30 DIAGNOSIS — Z0000 Encounter for general adult medical examination without abnormal findings: Secondary | ICD-10-CM

## 2017-06-30 DIAGNOSIS — R519 Headache, unspecified: Secondary | ICD-10-CM

## 2017-06-30 NOTE — Assessment & Plan Note (Signed)
Continue Advil prn 

## 2017-06-30 NOTE — Assessment & Plan Note (Signed)
Continue Prilosec Encouraged weight loss

## 2017-06-30 NOTE — Progress Notes (Signed)
HPI:  Pt presents to the clinic today for her Welcome to Medicare Exam. She is also due to follow up chronic conditions.  Seasonal Allergies: Occur all year long. She takes Xyzal, Arnuity Ellipta and Albuterol as prescribed with good relief. She has seen ENT in the past.  Frequent Headaches: These occur about 1 x month. She thinks they may be allergy related. She takes Advil as needed with good relief.  GERD: She takes Prilosec daily as prescribed. She denies breakthrough symptoms.  Past Medical History:  Diagnosis Date  . Allergy   . Bee sting allergy 03/02/2015  . Chicken pox   . Frequent headaches   . GERD (gastroesophageal reflux disease)     Current Outpatient Prescriptions  Medication Sig Dispense Refill  . albuterol (PROVENTIL HFA;VENTOLIN HFA) 108 (90 Base) MCG/ACT inhaler Inhale 1-2 puffs into the lungs every 6 (six) hours as needed for wheezing or shortness of breath. 1 Inhaler 1  . Fluticasone Furoate (ARNUITY ELLIPTA) 100 MCG/ACT AEPB Inhale 1 puff into the lungs 2 (two) times daily.    Marland Kitchen levocetirizine (XYZAL) 5 MG tablet TAKE ONE TABLET BY MOUTH IN THE EVENING 30 tablet 0  . omeprazole (PRILOSEC) 40 MG capsule TAKE ONE CAPSULE BY MOUTH DAILY 30 capsule 4   No current facility-administered medications for this visit.     Allergies  Allergen Reactions  . Bee Venom Itching  . Latex Rash    The powder in the gloves causes rash    Family History  Problem Relation Age of Onset  . Stroke Mother   . Mental retardation Mother   . Cancer Maternal Grandmother   . Cancer Paternal Grandmother        Breast  . Heart disease Paternal Grandfather     Social History   Social History  . Marital status: Married    Spouse name: N/A  . Number of children: N/A  . Years of education: N/A   Occupational History  . Not on file.   Social History Main Topics  . Smoking status: Never Smoker  . Smokeless tobacco: Never Used  . Alcohol use No  . Drug use: No  . Sexual  activity: Yes    Birth control/ protection: None   Other Topics Concern  . Not on file   Social History Narrative  . No narrative on file    Hospitiliaztions: None  Health Maintenance:    Flu: 12/2014  Tetanus: 12/2014  Mammogram: never  Pap Smear: 12/2014  Eye Doctor: yearly  Dental Exam: as needed   Providers:   PCP: Webb Silversmith, NP-C     I have personally reviewed and have noted:  1. The patient's medical and social history 2. Their use of alcohol, tobacco or illicit drugs 3. Their current medications and supplements 4. The patient's functional ability including ADL's, fall risks, home safety risks and hearing or visual impairment. 5. Diet and physical activities 6. Evidence for depression or mood disorder  Subjective:   Review of Systems:   Constitutional: Denies fever, malaise, fatigue, headache or abrupt weight changes.  HEENT: Pt reports nasal congestion. Denies eye pain, eye redness, ear pain, ringing in the ears, wax buildup, runny nose, bloody nose, or sore throat. Respiratory: Denies difficulty breathing, shortness of breath, cough or sputum production.   Cardiovascular: Denies chest pain, chest tightness, palpitations or swelling in the hands or feet.  Gastrointestinal: Pt reports intermittent reflux. Denies abdominal pain, bloating, constipation, diarrhea or blood in the stool.  GU: Denies  urgency, frequency, pain with urination, burning sensation, blood in urine, odor or discharge. Musculoskeletal: Denies decrease in range of motion, difficulty with gait, muscle pain or joint pain and swelling.  Skin: Denies redness, rashes, lesions or ulcercations.  Neurological: Denies dizziness, difficulty with memory, difficulty with speech or problems with balance and coordination.  Psych: Denies anxiety, depression, SI/HI.  No other specific complaints in a complete review of systems (except as listed in HPI above).  Objective:  PE:   BP 124/80   Pulse 74    Temp 97.8 F (36.6 C) (Oral)   Ht _0  (1.727 m)   Wt 216 lb (98 kg)   LMP 06/22/2017   SpO2 98%   BMI 32.84 kg/m   Wt Readings from Last 3 Encounters:  05/17/17 219 lb (99.3 kg)  05/04/17 234 lb (106.1 kg)  03/16/16 234 lb (106.1 kg)    General: Appears her stated age, obese in NAD. Skin: Warm, dry and intact. HEENT: Head: normal shape and size; Eyes: sclera white, no icterus, conjunctiva pink, PERRLA and EOMs intact; Ears: Tm's gray and intact, normal light reflex; Throat/Mouth: Teeth present, mucosa pink and moist, no exudate, lesions or ulcerations noted.  Neck: Neck supple, trachea midline. No masses, lumps or thyromegaly present.  Cardiovascular: Normal rate and rhythm. S1,S2 noted.  No murmur, rubs or gallops noted. No JVD or BLE edema.  Pulmonary/Chest: Normal effort and positive vesicular breath sounds. No respiratory distress. No wheezes, rales or ronchi noted.  Abdomen: Soft and nontender. Normal bowel sounds. No distention or masses noted.  Musculoskeletal: Strength 5/5 BUE/BLE. No signs of joint swelling.  Neurological: Alert and oriented. Cranial nerves II-XII grossly intact. Coordination normal.  Psychiatric: Mood and affect normal. Behavior is normal. Judgment and thought content normal.   EKG:  BMET    Component Value Date/Time   NA 140 09/10/2015 1427   K 3.8 09/10/2015 1427   CL 109 09/10/2015 1427   CO2 26 09/10/2015 1427   GLUCOSE 96 09/10/2015 1427   BUN 12 09/10/2015 1427   CREATININE 0.88 09/10/2015 1427   CALCIUM 8.8 09/10/2015 1427   GFRNONAA >60 09/22/2010 1020   GFRAA  09/22/2010 1020    >60        The eGFR has been calculated using the MDRD equation. This calculation has not been validated in all clinical situations. eGFR's persistently <60 mL/min signify possible Chronic Kidney Disease.    Lipid Panel     Component Value Date/Time   CHOL 121 01/02/2015 1518   TRIG 76.0 01/02/2015 1518   HDL 40.30 01/02/2015 1518   CHOLHDL 3  01/02/2015 1518   VLDL 15.2 01/02/2015 1518   LDLCALC 66 01/02/2015 1518    CBC    Component Value Date/Time   WBC 6.4 09/10/2015 1427   RBC 4.38 09/10/2015 1427   HGB 13.0 09/10/2015 1427   HCT 38.8 09/10/2015 1427   PLT 266.0 09/10/2015 1427   MCV 88.5 09/10/2015 1427   MCH 30.2 09/03/2011 1454   MCHC 33.5 09/10/2015 1427   RDW 13.1 09/10/2015 1427   LYMPHSABS 2.9 05/17/2015 1334   MONOABS 0.3 05/17/2015 1334   EOSABS 0.1 05/17/2015 1334   BASOSABS 0.0 05/17/2015 1334    Hgb A1C Lab Results  Component Value Date   HGBA1C 5.0 01/02/2015      Assessment and Plan:   Medicare Annual Wellness Visit:  Diet: She does eat meat. She consumes fruits and veggies daily. She does eat some fried foods. She  drinks mostly water, some soda. Physical activity: Walking the dog. Depression/mood screen: Negative Hearing: Intact to whispered voice Visual acuity: Grossly normal, performs annual eye exam  ADLs: Capable Fall risk: None Home safety: Good Cognitive evaluation: Intact to orientation, naming, recall and repetition EOL planning: No adv directives, full code/ I agree  Preventative Medicine: Encouraged her to get a flu shot in the fall. Tetanus UTD. Pap smear UTD. Will start screening mammograms at 76. Encouraged her to consume a balanced diet and exercise regimen. Advised her to see an eye doctor and dentist annually. Will check CBC, CMET, Lipid and A1C today. ECG today normal.    Next appointment: 1 year, Medicare Wellness Exam   Webb Silversmith, NP

## 2017-06-30 NOTE — Patient Instructions (Signed)

## 2017-06-30 NOTE — Assessment & Plan Note (Signed)
Continue Xyzal, Arnuity Ellipta and Albuterol

## 2017-07-01 LAB — CBC
HCT: 38.5 % (ref 36.0–46.0)
HEMOGLOBIN: 13 g/dL (ref 12.0–15.0)
MCHC: 33.8 g/dL (ref 30.0–36.0)
MCV: 91.7 fl (ref 78.0–100.0)
PLATELETS: 257 10*3/uL (ref 150.0–400.0)
RBC: 4.2 Mil/uL (ref 3.87–5.11)
RDW: 13.1 % (ref 11.5–15.5)
WBC: 6.6 10*3/uL (ref 4.0–10.5)

## 2017-07-01 LAB — COMPREHENSIVE METABOLIC PANEL
ALK PHOS: 73 U/L (ref 39–117)
ALT: 12 U/L (ref 0–35)
AST: 13 U/L (ref 0–37)
Albumin: 3.6 g/dL (ref 3.5–5.2)
BUN: 23 mg/dL (ref 6–23)
CALCIUM: 8.7 mg/dL (ref 8.4–10.5)
CO2: 25 meq/L (ref 19–32)
CREATININE: 0.75 mg/dL (ref 0.40–1.20)
Chloride: 108 mEq/L (ref 96–112)
GFR: 90.68 mL/min (ref >60.00)
Glucose, Bld: 82 mg/dL (ref 70–99)
Potassium: 3.7 mEq/L (ref 3.5–5.1)
Sodium: 139 mEq/L (ref 135–145)
Total Bilirubin: 0.4 mg/dL (ref 0.2–1.2)
Total Protein: 6.1 g/dL (ref 6.0–8.3)

## 2017-07-01 LAB — LIPID PANEL
CHOL/HDL RATIO: 3
Cholesterol: 131 mg/dL (ref 0–200)
HDL: 45.7 mg/dL (ref >39.00)
LDL Cholesterol: 73 mg/dL (ref 0–99)
NONHDL: 85.23
TRIGLYCERIDES: 61 mg/dL (ref 0.0–149.0)
VLDL: 12.2 mg/dL (ref 0.0–40.0)

## 2017-07-01 LAB — HEMOGLOBIN A1C: Hgb A1c MFr Bld: 4.7 % (ref 4.6–6.5)

## 2017-07-02 ENCOUNTER — Telehealth: Payer: Self-pay | Admitting: Internal Medicine

## 2017-07-02 NOTE — Telephone Encounter (Signed)
Pt called to get lab results. She said she does not know how to get into her MyChart. I transferred her to MyChart #.

## 2017-07-02 NOTE — Telephone Encounter (Signed)
Left message on voicemail All of her labs were normal

## 2017-07-15 DIAGNOSIS — Z0100 Encounter for examination of eyes and vision without abnormal findings: Secondary | ICD-10-CM | POA: Diagnosis not present

## 2017-07-26 ENCOUNTER — Ambulatory Visit (INDEPENDENT_AMBULATORY_CARE_PROVIDER_SITE_OTHER): Payer: Medicare HMO | Admitting: Internal Medicine

## 2017-07-26 ENCOUNTER — Encounter: Payer: Self-pay | Admitting: Internal Medicine

## 2017-07-26 VITALS — BP 118/82 | HR 70 | Temp 98.0°F | Wt 211.0 lb

## 2017-07-26 DIAGNOSIS — J301 Allergic rhinitis due to pollen: Secondary | ICD-10-CM

## 2017-07-26 NOTE — Progress Notes (Signed)
Subjective:    Patient ID: Kathy Gamble, female    DOB: 1976-07-20, 41 y.o.   MRN: [id number]  HPI  Pt presents to the clinic today with c/o nasal congestion and cough. This started 2 days ago. She is blowing clear mucous out of her nose. The cough is productive of yellow mucous. She denies runny nose, ear pain, sore throat or shortness of breath. She denies fever, chills or body aches. She has tried Mucinex and Flonase with some relief. She has a history of seasonal allergies. She has not had sick contacts.  Review of Systems      Past Medical History:  Diagnosis Date  . Allergy   . Bee sting allergy 03/02/2015  . Chicken pox   . Frequent headaches   . GERD (gastroesophageal reflux disease)     Current Outpatient Prescriptions  Medication Sig Dispense Refill  . albuterol (PROVENTIL HFA;VENTOLIN HFA) 108 (90 Base) MCG/ACT inhaler Inhale 1-2 puffs into the lungs every 6 (six) hours as needed for wheezing or shortness of breath. 1 Inhaler 1  . Fluticasone Furoate (ARNUITY ELLIPTA) 100 MCG/ACT AEPB Inhale 1 puff into the lungs 2 (two) times daily.    Marland Kitchen levocetirizine (XYZAL) 5 MG tablet TAKE ONE TABLET BY MOUTH IN THE EVENING 30 tablet 0  . omeprazole (PRILOSEC) 40 MG capsule TAKE ONE CAPSULE BY MOUTH DAILY 30 capsule 4   No current facility-administered medications for this visit.     Allergies  Allergen Reactions  . Bee Venom Itching  . Latex Rash    The powder in the gloves causes rash    Family History  Problem Relation Age of Onset  . Stroke Mother   . Mental retardation Mother   . Cancer Maternal Grandmother   . Cancer Paternal Grandmother        Breast  . Heart disease Paternal Grandfather     Social History   Social History  . Marital status: Married    Spouse name: N/A  . Number of children: N/A  . Years of education: N/A   Occupational History  . Not on file.   Social History Main Topics  . Smoking status: Never Smoker  . Smokeless tobacco: Never  Used  . Alcohol use No  . Drug use: No  . Sexual activity: Yes    Birth control/ protection: None   Other Topics Concern  . Not on file   Social History Narrative  . No narrative on file     Constitutional: Denies fever, malaise, fatigue, headache or abrupt weight changes.  HEENT: Pt reports nasal congestion. Denies eye pain, eye redness, ear pain, ringing in the ears, wax buildup, runny nose, bloody nose, or sore throat. Respiratory: Pt reports cough. Denies difficulty breathing, shortness of breath.    No other specific complaints in a complete review of systems (except as listed in HPI above).  Objective:   Physical Exam  BP 118/82   Pulse 70   Temp 98 F (36.7 C) (Oral)   Wt 211 lb (95.7 kg)   SpO2 98%   BMI 32.08 kg/m  Wt Readings from Last 3 Encounters:  07/26/17 211 lb (95.7 kg)  06/30/17 216 lb (98 kg)  05/17/17 219 lb (99.3 kg)    General: Appears her stated age, in NAD. SHEENT: Head: normal shape and size, no sinus tenderness noted; Nose: mucosa pink and moist, septum midline; Throat/Mouth: Teeth present, mucosa pink and moist, no exudate, lesions or ulcerations noted.  Neck:  No adenopathy noted. Pulmonary/Chest: Normal effort and positive vesicular breath sounds. No respiratory distress. No wheezes, rales or ronchi noted.   BMET    Component Value Date/Time   NA 139 06/30/2017 1531   K 3.7 06/30/2017 1531   CL 108 06/30/2017 1531   CO2 25 06/30/2017 1531   GLUCOSE 82 06/30/2017 1531   BUN 23 06/30/2017 1531   CREATININE 0.75 06/30/2017 1531   CALCIUM 8.7 06/30/2017 1531   GFRNONAA >60 09/22/2010 1020   GFRAA  09/22/2010 1020    >60        The eGFR has been calculated using the MDRD equation. This calculation has not been validated in all clinical situations. eGFR's persistently <60 mL/min signify possible Chronic Kidney Disease.    Lipid Panel     Component Value Date/Time   CHOL 131 06/30/2017 1531   TRIG 61.0 06/30/2017 1531   HDL  45.70 06/30/2017 1531   CHOLHDL 3 06/30/2017 1531   VLDL 12.2 06/30/2017 1531   LDLCALC 73 06/30/2017 1531    CBC    Component Value Date/Time   WBC 6.6 06/30/2017 1531   RBC 4.20 06/30/2017 1531   HGB 13.0 06/30/2017 1531   HCT 38.5 06/30/2017 1531   PLT 257.0 06/30/2017 1531   MCV 91.7 06/30/2017 1531   MCH 30.2 09/03/2011 1454   MCHC 33.8 06/30/2017 1531   RDW 13.1 06/30/2017 1531   LYMPHSABS 2.9 05/17/2015 1334   MONOABS 0.3 05/17/2015 1334   EOSABS 0.1 05/17/2015 1334   BASOSABS 0.0 05/17/2015 1334    Hgb A1C Lab Results  Component Value Date   HGBA1C 4.7 06/30/2017            Assessment & Plan:   Allergic Rhinitis:  Advised her to continue Xyzal and Flonase Try a vaporizer or Vicks on your chest  Return precautions discussed Webb Silversmith, NP

## 2017-07-26 NOTE — Patient Instructions (Signed)
Allergic Rhinitis Allergic rhinitis is when the mucous membranes in the nose respond to allergens. Allergens are particles in the air that cause your body to have an allergic reaction. This causes you to release allergic antibodies. Through a chain of events, these eventually cause you to release histamine into the blood stream. Although meant to protect the body, it is this release of histamine that causes your discomfort, such as frequent sneezing, congestion, and an itchy, runny nose. What are the causes? Seasonal allergic rhinitis (hay fever) is caused by pollen allergens that may come from grasses, trees, and weeds. Year-round allergic rhinitis (perennial allergic rhinitis) is caused by allergens such as house dust mites, pet dander, and mold spores. What are the signs or symptoms?  Nasal stuffiness (congestion).  Itchy, runny nose with sneezing and tearing of the eyes. How is this diagnosed? Your health care provider can help you determine the allergen or allergens that trigger your symptoms. If you and your health care provider are unable to determine the allergen, skin or blood testing may be used. Your health care provider will diagnose your condition after taking your health history and performing a physical exam. Your health care provider may assess you for other related conditions, such as asthma, pink eye, or an ear infection. How is this treated? Allergic rhinitis does not have a cure, but it can be controlled by:  Medicines that block allergy symptoms. These may include allergy shots, nasal sprays, and oral antihistamines.  Avoiding the allergen. Hay fever may often be treated with antihistamines in pill or nasal spray forms. Antihistamines block the effects of histamine. There are over-the-counter medicines that may help with nasal congestion and swelling around the eyes. Check with your health care provider before taking or giving this medicine. If avoiding the allergen or the  medicine prescribed do not work, there are many new medicines your health care provider can prescribe. Stronger medicine may be used if initial measures are ineffective. Desensitizing injections can be used if medicine and avoidance does not work. Desensitization is when a patient is given ongoing shots until the body becomes less sensitive to the allergen. Make sure you follow up with your health care provider if problems continue. Follow these instructions at home: It is not possible to completely avoid allergens, but you can reduce your symptoms by taking steps to limit your exposure to them. It helps to know exactly what you are allergic to so that you can avoid your specific triggers. Contact a health care provider if:  You have a fever.  You develop a cough that does not stop easily (persistent).  You have shortness of breath.  You start wheezing.  Symptoms interfere with normal daily activities. This information is not intended to replace advice given to you by your health care provider. Make sure you discuss any questions you have with your health care provider. Document Released: 08/25/2001 Document Revised: 07/31/2016 Document Reviewed: 08/07/2013 Elsevier Interactive Patient Education  2017 Elsevier Inc.  

## 2017-08-13 ENCOUNTER — Encounter: Payer: Self-pay | Admitting: Internal Medicine

## 2017-08-13 ENCOUNTER — Ambulatory Visit (INDEPENDENT_AMBULATORY_CARE_PROVIDER_SITE_OTHER): Payer: Medicare HMO | Admitting: Internal Medicine

## 2017-08-13 VITALS — BP 120/80 | HR 72 | Temp 97.9°F | Wt 209.0 lb

## 2017-08-13 DIAGNOSIS — J302 Other seasonal allergic rhinitis: Secondary | ICD-10-CM

## 2017-08-13 NOTE — Patient Instructions (Signed)
Allergic Rhinitis Allergic rhinitis is when the mucous membranes in the nose respond to allergens. Allergens are particles in the air that cause your body to have an allergic reaction. This causes you to release allergic antibodies. Through a chain of events, these eventually cause you to release histamine into the blood stream. Although meant to protect the body, it is this release of histamine that causes your discomfort, such as frequent sneezing, congestion, and an itchy, runny nose. What are the causes? Seasonal allergic rhinitis (hay fever) is caused by pollen allergens that may come from grasses, trees, and weeds. Year-round allergic rhinitis (perennial allergic rhinitis) is caused by allergens such as house dust mites, pet dander, and mold spores. What are the signs or symptoms?  Nasal stuffiness (congestion).  Itchy, runny nose with sneezing and tearing of the eyes. How is this diagnosed? Your health care provider can help you determine the allergen or allergens that trigger your symptoms. If you and your health care provider are unable to determine the allergen, skin or blood testing may be used. Your health care provider will diagnose your condition after taking your health history and performing a physical exam. Your health care provider may assess you for other related conditions, such as asthma, pink eye, or an ear infection. How is this treated? Allergic rhinitis does not have a cure, but it can be controlled by:  Medicines that block allergy symptoms. These may include allergy shots, nasal sprays, and oral antihistamines.  Avoiding the allergen. Hay fever may often be treated with antihistamines in pill or nasal spray forms. Antihistamines block the effects of histamine. There are over-the-counter medicines that may help with nasal congestion and swelling around the eyes. Check with your health care provider before taking or giving this medicine. If avoiding the allergen or the  medicine prescribed do not work, there are many new medicines your health care provider can prescribe. Stronger medicine may be used if initial measures are ineffective. Desensitizing injections can be used if medicine and avoidance does not work. Desensitization is when a patient is given ongoing shots until the body becomes less sensitive to the allergen. Make sure you follow up with your health care provider if problems continue. Follow these instructions at home: It is not possible to completely avoid allergens, but you can reduce your symptoms by taking steps to limit your exposure to them. It helps to know exactly what you are allergic to so that you can avoid your specific triggers. Contact a health care provider if:  You have a fever.  You develop a cough that does not stop easily (persistent).  You have shortness of breath.  You start wheezing.  Symptoms interfere with normal daily activities. This information is not intended to replace advice given to you by your health care provider. Make sure you discuss any questions you have with your health care provider. Document Released: 08/25/2001 Document Revised: 07/31/2016 Document Reviewed: 08/07/2013 Elsevier Interactive Patient Education  2017 Elsevier Inc.  

## 2017-08-13 NOTE — Progress Notes (Signed)
Subjective:    Patient ID: Kathy Gamble, female    DOB: Nov 27, 1976, 41 y.o.   MRN: [id number]  HPI  Pt presents to the clinic today with c/o itchy eyes, nasal congestion scratchy throat and cough. This has been going on for 3 weeks. She is blowing green/blood tinged mucous out of her nose. She denies difficulty swallowing. The cough is non productive. She denies fever, chills or body aches. She has a history of seasonal allergies. She is taking Xyzal as prescribed. She has not had sick contacts that she is aware of.   Review of Systems      Past Medical History:  Diagnosis Date  . Allergy   . Bee sting allergy 03/02/2015  . Chicken pox   . Frequent headaches   . GERD (gastroesophageal reflux disease)     Current Outpatient Prescriptions  Medication Sig Dispense Refill  . albuterol (PROVENTIL HFA;VENTOLIN HFA) 108 (90 Base) MCG/ACT inhaler Inhale 1-2 puffs into the lungs every 6 (six) hours as needed for wheezing or shortness of breath. 1 Inhaler 1  . Fluticasone Furoate (ARNUITY ELLIPTA) 100 MCG/ACT AEPB Inhale 1 puff into the lungs 2 (two) times daily.    Marland Kitchen levocetirizine (XYZAL) 5 MG tablet TAKE ONE TABLET BY MOUTH IN THE EVENING 30 tablet 0  . omeprazole (PRILOSEC) 40 MG capsule TAKE ONE CAPSULE BY MOUTH DAILY 30 capsule 4   No current facility-administered medications for this visit.     Allergies  Allergen Reactions  . Bee Venom Itching  . Latex Rash    The powder in the gloves causes rash    Family History  Problem Relation Age of Onset  . Stroke Mother   . Mental retardation Mother   . Cancer Maternal Grandmother   . Cancer Paternal Grandmother        Breast  . Heart disease Paternal Grandfather     Social History   Social History  . Marital status: Married    Spouse name: N/A  . Number of children: N/A  . Years of education: N/A   Occupational History  . Not on file.   Social History Main Topics  . Smoking status: Never Smoker  . Smokeless  tobacco: Never Used  . Alcohol use No  . Drug use: No  . Sexual activity: Yes    Birth control/ protection: None   Other Topics Concern  . Not on file   Social History Narrative  . No narrative on file     Constitutional: Denies fever, malaise, fatigue, headache or abrupt weight changes.  HEENT: Pt reports itchy eyes, nasal congestion and sore throat. Denies eye pain, eye redness, ear pain, ringing in the ears, wax buildup, runny nose, bloody nose. Respiratory: Pt reports cough. Denies difficulty breathing, shortness of breath, or sputum production.    No other specific complaints in a complete review of systems (except as listed in HPI above).  Objective:   Physical Exam   BP 120/80   Pulse 72   Temp 97.9 F (36.6 C) (Oral)   Wt 209 lb (94.8 kg)   SpO2 98%   BMI 31.78 kg/m  Wt Readings from Last 3 Encounters:  08/13/17 209 lb (94.8 kg)  07/26/17 211 lb (95.7 kg)  06/30/17 216 lb (98 kg)    General: Appears her stated age, in NAD. HEENT: Head: normal shape and size, no sinus tenderness noted; Eyes: sclera white, no icterus, conjunctiva pink; Ears: scarring noted on bilateral TM; Nose: mucosa pink  and moist, septum midline; Throat/Mouth: Teeth present, mucosa pink and moist, no exudate, lesions or ulcerations noted.  Neck:  No adenopathy noted. Pulmonary/Chest: Normal effort and positive vesicular breath sounds. No respiratory distress. No wheezes, rales or ronchi noted.    BMET    Component Value Date/Time   NA 139 06/30/2017 1531   K 3.7 06/30/2017 1531   CL 108 06/30/2017 1531   CO2 25 06/30/2017 1531   GLUCOSE 82 06/30/2017 1531   BUN 23 06/30/2017 1531   CREATININE 0.75 06/30/2017 1531   CALCIUM 8.7 06/30/2017 1531   GFRNONAA >60 09/22/2010 1020   GFRAA  09/22/2010 1020    >60        The eGFR has been calculated using the MDRD equation. This calculation has not been validated in all clinical situations. eGFR's persistently <60 mL/min  signify possible Chronic Kidney Disease.    Lipid Panel     Component Value Date/Time   CHOL 131 06/30/2017 1531   TRIG 61.0 06/30/2017 1531   HDL 45.70 06/30/2017 1531   CHOLHDL 3 06/30/2017 1531   VLDL 12.2 06/30/2017 1531   LDLCALC 73 06/30/2017 1531    CBC   Hgb A1C Lab Results  Component Value Date   HGBA1C 4.7 06/30/2017           Assessment & Plan:   Allergic Rhinitis:  Reassured her this is not infection Get Saline nasal spray for the nose Continue Xyzal Follow up with allergy if symptoms persist or worsen Chez Bulnes, NP

## 2017-09-06 ENCOUNTER — Encounter: Payer: Self-pay | Admitting: Primary Care

## 2017-09-06 ENCOUNTER — Ambulatory Visit (INDEPENDENT_AMBULATORY_CARE_PROVIDER_SITE_OTHER): Payer: Medicare HMO | Admitting: Primary Care

## 2017-09-06 VITALS — BP 116/80 | HR 98 | Temp 98.5°F | Ht 68.0 in | Wt 207.1 lb

## 2017-09-06 DIAGNOSIS — J302 Other seasonal allergic rhinitis: Secondary | ICD-10-CM | POA: Diagnosis not present

## 2017-09-06 MED ORDER — ALBUTEROL SULFATE HFA 108 (90 BASE) MCG/ACT IN AERS: 1.0000 | INHALATION_SPRAY | Freq: Four times a day (QID) | RESPIRATORY_TRACT | 0 refills | Status: DC | PRN

## 2017-09-06 MED ORDER — FLUTICASONE PROPIONATE 50 MCG/ACT NA SUSP: 1.0000 | Freq: Two times a day (BID) | NASAL | 0 refills | Status: DC

## 2017-09-06 NOTE — Progress Notes (Signed)
Subjective:    Patient ID: Kathy Gamble, female    DOB: 21-Aug-1976, 41 y.o.   MRN: [id number]  HPI  Kathy Gamble is a 41 year old female with a history of seasonal allergies and GERD who presents today with a chief complaint of ear pain. She also reports nasal congestion, sore throat, cough. Her symptoms began 2 weeks ago. Her cough is productive with yellow mucous. She's blowing clear mucous from her nasal cavity. She has noticed some shortness of breath at night when walking her dog. She's taken Mucinex without much improvement. She's been out of her albuterol inhaler. She denies fevers.   Review of Systems  Constitutional: Negative for fever.  HENT: Positive for congestion, sinus pressure and sore throat.   Respiratory: Positive for cough and shortness of breath. Negative for wheezing.        Past Medical History:  Diagnosis Date  . Allergy   . Bee sting allergy 03/02/2015  . Chicken pox   . Frequent headaches   . GERD (gastroesophageal reflux disease)      Social History   Social History  . Marital status: Married    Spouse name: N/A  . Number of children: N/A  . Years of education: N/A   Occupational History  . Not on file.   Social History Main Topics  . Smoking status: Never Smoker  . Smokeless tobacco: Never Used  . Alcohol use No  . Drug use: No  . Sexual activity: Yes    Birth control/ protection: None   Other Topics Concern  . Not on file   Social History Narrative  . No narrative on file    Past Surgical History:  Procedure Laterality Date  . ADENOIDECTOMY    . TONSILLECTOMY      Family History  Problem Relation Age of Onset  . Stroke Mother   . Mental retardation Mother   . Cancer Maternal Grandmother   . Cancer Paternal Grandmother        Breast  . Heart disease Paternal Grandfather     Allergies  Allergen Reactions  . Bee Venom Itching  . Latex Rash    The powder in the gloves causes rash    Current Outpatient Prescriptions on  File Prior to Visit  Medication Sig Dispense Refill  . Fluticasone Furoate (ARNUITY ELLIPTA) 100 MCG/ACT AEPB Inhale 1 puff into the lungs 2 (two) times daily.    Marland Kitchen levocetirizine (XYZAL) 5 MG tablet TAKE ONE TABLET BY MOUTH IN THE EVENING 30 tablet 0  . omeprazole (PRILOSEC) 40 MG capsule TAKE ONE CAPSULE BY MOUTH DAILY 30 capsule 4   No current facility-administered medications on file prior to visit.     BP 116/80   Pulse 98   Temp 98.5 F (36.9 C) (Oral)   Ht  (1.727 m)   Wt 207 lb 1.9 oz (93.9 kg)   LMP 08/09/2017 (Within Days)   SpO2 99%   BMI 31.49 kg/m    Objective:   Physical Exam  Constitutional: She appears well-nourished.  HENT:  Right Ear: Tympanic membrane and ear canal normal.  Left Ear: Tympanic membrane and ear canal normal.  Nose: Right sinus exhibits no maxillary sinus tenderness and no frontal sinus tenderness. Left sinus exhibits no maxillary sinus tenderness and no frontal sinus tenderness.  Mouth/Throat: Oropharynx is clear and moist.  Eyes: Conjunctivae are normal.  Neck: Neck supple.  Cardiovascular: Normal rate and regular rhythm.   Pulmonary/Chest: Effort normal and breath  sounds normal. She has no wheezes. She has no rales.  Lymphadenopathy:    She has no cervical adenopathy.  Skin: Skin is warm and dry.          Assessment & Plan:

## 2017-09-06 NOTE — Patient Instructions (Signed)
You symptoms are related to allergies.  Nasal Congestion/Ear Pressure: Try using Flonase (fluticasone) nasal spray. Instill 1 spray in each nostril twice daily.   Shortness of Breath/Wheezing/Cough: Use the albuterol inhaler. Inhale 2 puffs into the lungs every 6 to 8 hours as needed for wheezing and/or shortness of breath.   You can try Delsym as needed for cough. This may be purchased over the counter.   It was a pleasure to see you today!

## 2017-09-06 NOTE — Assessment & Plan Note (Signed)
Suspect symptoms today secondary to seasonal allergies. Rx for Flonase and albuterol inhaler sent to pharmacy. Continue Xyzal. Will have her continue to monitor symptoms and follow up with ENT if no improvement.

## 2017-10-04 DIAGNOSIS — G43719 Chronic migraine without aura, intractable, without status migrainosus: Secondary | ICD-10-CM | POA: Diagnosis not present

## 2017-10-04 DIAGNOSIS — G5603 Carpal tunnel syndrome, bilateral upper limbs: Secondary | ICD-10-CM | POA: Diagnosis not present

## 2017-10-25 ENCOUNTER — Ambulatory Visit: Payer: Medicare HMO | Admitting: Family Medicine

## 2017-10-25 ENCOUNTER — Encounter: Payer: Self-pay | Admitting: Family Medicine

## 2017-10-25 DIAGNOSIS — J0100 Acute maxillary sinusitis, unspecified: Secondary | ICD-10-CM

## 2017-10-25 DIAGNOSIS — J302 Other seasonal allergic rhinitis: Secondary | ICD-10-CM

## 2017-10-25 MED ORDER — FLUTICASONE FUROATE 100 MCG/ACT IN AEPB: 1.0000 | INHALATION_SPRAY | Freq: Every day | RESPIRATORY_TRACT | 2 refills | Status: DC

## 2017-10-25 MED ORDER — ALBUTEROL SULFATE HFA 108 (90 BASE) MCG/ACT IN AERS: 1.0000 | INHALATION_SPRAY | Freq: Four times a day (QID) | RESPIRATORY_TRACT | 0 refills | Status: DC | PRN

## 2017-10-25 MED ORDER — AMOXICILLIN-POT CLAVULANATE 875-125 MG PO TABS: 1.0000 | ORAL_TABLET | Freq: Two times a day (BID) | ORAL | 0 refills | Status: DC

## 2017-10-25 NOTE — Patient Instructions (Signed)
Start augmentin, restart your inhalers.  Rinse after using fluticasone.  Take care.  Glad to see you.  Update [place] as needed.

## 2017-10-25 NOTE — Progress Notes (Signed)
She is out of fluticasone nasal spray and inhaler.  She has been using nasal saline instead.  She had occ nosebleeds with flonase, so she uses it less and has limited the number of bleeds.    Still on xyzal w/o much relief recently . She is out of albuterol.  She lost her inhaler.  D/w pt.    Sx started about 2 weeks ago.  Started with stuffy nose.  Then some postnasal gtt, yellow nasal discharge.  Clearing her throat.  Had been SOB in the meantime.  Episodic wheeze, worse at night.  No vomiting.  No fevers.   More facial pain recently.  Ear pain B.   Meds, vitals, and allergies reviewed.   ROS: Per HPI unless specifically indicated in ROS section   GEN: nad, alert and oriented HEENT: mucous membranes moist, tm w/o erythema but B chronic TM changes noted, nasal exam w/o erythema, clear discharge noted,  OP with cobblestoning, sinuses ttp x 4 NECK: supple w/o LA CV: rrr.   PULM: ctab, no inc wob EXT: no edema

## 2017-10-27 DIAGNOSIS — J0100 Acute maxillary sinusitis, unspecified: Secondary | ICD-10-CM | POA: Insufficient documentation

## 2017-10-27 NOTE — Assessment & Plan Note (Signed)
Nontoxic.   okay for outpatient follow-up.  Discussed with patient in detail about the plan. Start augmentin, restart her inhalers.  Rinse after using fluticasone.  Update [place] as needed.  She agrees.

## 2017-11-15 ENCOUNTER — Other Ambulatory Visit: Payer: Self-pay

## 2017-11-15 ENCOUNTER — Encounter: Payer: Self-pay | Admitting: Family Medicine

## 2017-11-15 ENCOUNTER — Ambulatory Visit: Payer: Medicare HMO | Admitting: Family Medicine

## 2017-11-15 VITALS — BP 104/64 | HR 86 | Temp 98.6°F | Ht 68.0 in | Wt 212.5 lb

## 2017-11-15 DIAGNOSIS — J0100 Acute maxillary sinusitis, unspecified: Secondary | ICD-10-CM | POA: Diagnosis not present

## 2017-11-15 MED ORDER — AMOXICILLIN-POT CLAVULANATE 875-125 MG PO TABS: 1.0000 | ORAL_TABLET | Freq: Two times a day (BID) | ORAL | 0 refills | Status: AC

## 2017-11-15 NOTE — Progress Notes (Signed)
Dr. Karleen HampshireSpencer T. Charlize Hathaway, MD, CAQ [facility] [address] [place], [address] Phone: [phone] Fax: [phone]  11/15/2017  Patient: Kathy Gamble, MRN: [id number], DOB: 1976/06/16, 41 y.o.  Primary Physician:  Lorre MunroeBaity, Regina W, NP   Chief Complaint  Patient presents with  . Sinusitis  . Hoarse  . Scratchy Throat   Subjective:   Kathy BarbaraLucy C Grippi is a 41 y.o. very pleasant female patient who presents with the following:  About 1-2 weeks. Green congestion. Has been needed to use albuterol. Has a new big dog.  She globally has not been feeling very well for at least a week, approaching 2 weeks.  She over the weekend developed some green mucus coming from her nose and has some pain in her sinuses, and both maxillary and frontal sinuses.  She has some ear congestion to a lesser degree.  She also has some sore throat.  Past Medical History, Surgical History, Social History, Family History, Problem List, Medications, and Allergies have been reviewed and updated if relevant.  Patient Active Problem List   Diagnosis Date Noted  . Acute non-recurrent maxillary sinusitis 10/27/2017  . Obesity (BMI 30-39.9) 01/02/2015  . Gastroesophageal reflux disease without esophagitis 12/28/2014  . Seasonal allergies 12/28/2014  . Frequent headaches 12/28/2014    Past Medical History:  Diagnosis Date  . Allergy   . Bee sting allergy 03/02/2015  . Chicken pox   . Frequent headaches   . GERD (gastroesophageal reflux disease)     Past Surgical History:  Procedure Laterality Date  . ADENOIDECTOMY    . TONSILLECTOMY      Social History   Socioeconomic History  . Marital status: Married    Spouse name: Not on file  . Number of children: Not on file  . Years of education: Not on file  . Highest education level: Not on file  Social Needs  . Financial resource strain: Not on file  . Food insecurity - worry: Not on file  . Food insecurity -  inability: Not on file  . Transportation needs - medical: Not on file  . Transportation needs - non-medical: Not on file  Occupational History  . Not on file  Tobacco Use  . Smoking status: Never Smoker  . Smokeless tobacco: Never Used  Substance and Sexual Activity  . Alcohol use: No    Alcohol/week: 0.0 oz  . Drug use: No  . Sexual activity: Yes    Birth control/protection: None  Other Topics Concern  . Not on file  Social History Narrative  . Not on file    Family History  Problem Relation Age of Onset  . Stroke Mother   . Mental retardation Mother   . Cancer Maternal Grandmother   . Cancer Paternal Grandmother        Breast  . Heart disease Paternal Grandfather     Allergies  Allergen Reactions  . Bee Venom Itching  . Latex Rash    The powder in the gloves causes rash    Medication list reviewed and updated in full in [facility].  ROS: GEN: Acute illness details above GI: Tolerating PO intake GU: maintaining adequate hydration and urination Pulm: No SOB Interactive and getting along well at home.  Otherwise, ROS is as per the HPI.  Objective:   BP 104/64   Pulse 86   Temp 98.6 F (37 C) (Oral)   Ht 5\' 8"  (1.727 m)   Wt  212 lb 8 oz (96.4 kg)   LMP 11/10/2017   BMI 32.31 kg/m    Gen: WDWN, NAD; alert,appropriate and cooperative throughout exam    HEENT: Normocephalic and atraumatic. Throat clear, w/o exudate, no LAD, R TM clear, L TM - good landmarks, No fluid present. rhinnorhea.  Left frontal and maxillary sinuses: Tender Right frontal and maxillary sinuses: Tender    Neck: No ant or post LAD  CV: RRR, No M/G/R  Pulm: Breathing comfortably in no resp distress. no w/c/r  Abd: S,NT,ND,+BS Extr: no c/c/e Psych: full affect, pleasant    Laboratory and Imaging Data:  Assessment and Plan:   Acute non-recurrent maxillary sinusitis  Supportive care and antibiotics.  Follow-up: No Follow-up on file.  Meds ordered this encounter    Medications  . amoxicillin-clavulanate (AUGMENTIN) 875-125 MG tablet    Sig: Take 1 tablet by mouth 2 (two) times daily for 10 days.    Dispense:  20 tablet    Refill:  0   Medications Discontinued During This Encounter  Medication Reason  . amoxicillin-clavulanate (AUGMENTIN) 875-125 MG tablet Completed Course   No orders of the defined types were placed in this encounter.   Signed,  Elpidio Galea. Maikel Neisler, MD   Allergies as of 11/15/2017      Reactions   Bee Venom Itching   Latex Rash   The powder in the gloves causes rash      Medication List        Accurate as of 11/15/17 10:33 AM. Always use your most recent med list.          albuterol 108 (90 Base) MCG/ACT inhaler Commonly known as:  PROVENTIL HFA;VENTOLIN HFA Inhale 1-2 puffs every 6 (six) hours as needed into the lungs for wheezing or shortness of breath.   amoxicillin-clavulanate 875-125 MG tablet Commonly known as:  AUGMENTIN Take 1 tablet by mouth 2 (two) times daily for 10 days.   fluticasone 50 MCG/ACT nasal spray Commonly known as:  FLONASE Place 1 spray into both nostrils 2 (two) times daily.   Fluticasone Furoate 100 MCG/ACT Aepb Commonly known as:  ARNUITY ELLIPTA Inhale 1 puff daily into the lungs.   levocetirizine 5 MG tablet Commonly known as:  XYZAL TAKE ONE TABLET BY MOUTH IN THE EVENING   omeprazole 40 MG capsule Commonly known as:  PRILOSEC TAKE ONE CAPSULE BY MOUTH DAILY   sodium chloride 0.65 % Soln nasal spray Commonly known as:  OCEAN Place 1 spray into both nostrils as needed for congestion.

## 2017-12-10 ENCOUNTER — Other Ambulatory Visit: Payer: Self-pay | Admitting: Internal Medicine

## 2017-12-29 ENCOUNTER — Other Ambulatory Visit: Payer: Self-pay

## 2017-12-29 ENCOUNTER — Ambulatory Visit (INDEPENDENT_AMBULATORY_CARE_PROVIDER_SITE_OTHER): Payer: Medicare HMO | Admitting: Family Medicine

## 2017-12-29 ENCOUNTER — Encounter: Payer: Self-pay | Admitting: Family Medicine

## 2017-12-29 VITALS — BP 104/70 | HR 63 | Temp 97.8°F | Ht 68.0 in | Wt 208.5 lb

## 2017-12-29 DIAGNOSIS — R0981 Nasal congestion: Secondary | ICD-10-CM | POA: Diagnosis not present

## 2017-12-29 DIAGNOSIS — H6693 Otitis media, unspecified, bilateral: Secondary | ICD-10-CM

## 2017-12-29 MED ORDER — AMOXICILLIN 500 MG PO CAPS: 1000.0000 mg | ORAL_CAPSULE | Freq: Two times a day (BID) | ORAL | 0 refills | Status: AC

## 2017-12-29 NOTE — Progress Notes (Signed)
Dr. Karleen Hampshire T. Aries Kasa, MD, CAQ [facility] [address] [place], [address] Phone: [phone] Fax: [phone]  12/29/2017  Patient: Kathy Gamble, MRN: [id number], DOB: [birth year], 42 y.o.  Primary Physician:  Lorre Munroe, NP   Chief Complaint  Patient presents with  . Ear Pain    Bilateral  . Headache   Subjective:   Kathy Gamble is a 42 y.o. very pleasant female patient who presents with the following:  Both ears are hurting and HA. 2 weeks of pain.  She has a persistent problem with allergies and has a lot of nasal congestion most the time.  She is taking her allergy medicine as prescribed and still having a lot of pain.  Over the last 2 weeks she has had a lot of bilateral ear pain.  She has had some intermittent fever, but none currently.  B OM  Past Medical History, Surgical History, Social History, Family History, Problem List, Medications, and Allergies have been reviewed and updated if relevant.  Patient Active Problem List   Diagnosis Date Noted  . Acute non-recurrent maxillary sinusitis 10/27/2017  . Obesity (BMI 30-39.9) 01/02/2015  . Gastroesophageal reflux disease without esophagitis 12/28/2014  . Seasonal allergies 12/28/2014  . Frequent headaches 12/28/2014    Past Medical History:  Diagnosis Date  . Allergy   . Bee sting allergy 03/02/2015  . Chicken pox   . Frequent headaches   . GERD (gastroesophageal reflux disease)     Past Surgical History:  Procedure Laterality Date  . ADENOIDECTOMY    . TONSILLECTOMY      Social History   Socioeconomic History  . Marital status: Married    Spouse name: Not on file  . Number of children: Not on file  . Years of education: Not on file  . Highest education level: Not on file  Social Needs  . Financial resource strain: Not on file  . Food insecurity - worry: Not on file  . Food insecurity - inability: Not on file  . Transportation needs - medical:  Not on file  . Transportation needs - non-medical: Not on file  Occupational History  . Not on file  Tobacco Use  . Smoking status: Never Smoker  . Smokeless tobacco: Never Used  Substance and Sexual Activity  . Alcohol use: No    Alcohol/week: 0.0 oz  . Drug use: No  . Sexual activity: Yes    Birth control/protection: None  Other Topics Concern  . Not on file  Social History Narrative  . Not on file    Family History  Problem Relation Age of Onset  . Stroke Mother   . Mental retardation Mother   . Cancer Maternal Grandmother   . Cancer Paternal Grandmother        Breast  . Heart disease Paternal Grandfather     Allergies  Allergen Reactions  . Bee Venom Itching  . Latex Rash    The powder in the gloves causes rash    Medication list reviewed and updated in full in [facility].  ROS: GEN: Acute illness details above GI: Tolerating PO intake GU: maintaining adequate hydration and urination Pulm: No SOB Interactive and getting along well at home.  Otherwise, ROS is as per the HPI.  Objective:   BP 104/70   Pulse 63   Temp 97.8 F (36.6 C) (Oral)   Ht 5\' 8"  (1.727 m)   Wt 208 lb 8  oz (94.6 kg)   LMP 12/14/2017 (Approximate)   BMI 31.70 kg/m    Gen: WDWN, NAD; A & O x3, cooperative. Pleasant.Globally Non-toxic HEENT: Normocephalic and atraumatic. Throat clear, w/o exudate, B TM bulging with poor COL, scarred, + fluid present. rhinnorhea.  MMM Frontal sinuses: NT Max sinuses: NT NECK: Anterior cervical  LAD is absent CV: RRR, No M/G/R, cap refill <2 sec PULM: Breathing comfortably in no respiratory distress. no wheezing, crackles, rhonchi EXT: No c/c/e PSYCH: Friendly, good eye contact MSK: Nml gait     Laboratory and Imaging Data:  Assessment and Plan:   Acute bacterial otitis media, bilateral  Nasal congestion  Rx OM  Patient Instructions  Over the counter Sudafed - ask the pharmacist to help you find it.  Afrin nasal spray: 2  sprays each nostril twice a day for max of 3-4 days     Follow-up: No Follow-up on file.  Meds ordered this encounter  Medications  . amoxicillin (AMOXIL) 500 MG capsule    Sig: Take 2 capsules (1,000 mg total) by mouth 2 (two) times daily for 10 days.    Dispense:  40 capsule    Refill:  0   Signed,  Jahred Tatar T. Margarett Viti, MD   Allergies as of 12/29/2017      Reactions   Bee Venom Itching   Latex Rash   The powder in the gloves causes rash      Medication List        Accurate as of 12/29/17 12:50 PM. Always use your most recent med list.          albuterol 108 (90 Base) MCG/ACT inhaler Commonly known as:  PROVENTIL HFA;VENTOLIN HFA Inhale 1-2 puffs every 6 (six) hours as needed into the lungs for wheezing or shortness of breath.   amoxicillin 500 MG capsule Commonly known as:  AMOXIL Take 2 capsules (1,000 mg total) by mouth 2 (two) times daily for 10 days.   fluticasone 50 MCG/ACT nasal spray Commonly known as:  FLONASE Place 1 spray into both nostrils 2 (two) times daily.   Fluticasone Furoate 100 MCG/ACT Aepb Commonly known as:  ARNUITY ELLIPTA Inhale 1 puff daily into the lungs.   levocetirizine 5 MG tablet Commonly known as:  XYZAL TAKE ONE TABLET BY MOUTH IN THE EVENING   omeprazole 40 MG capsule Commonly known as:  PRILOSEC TAKE ONE CAPSULE BY MOUTH DAILY   sodium chloride 0.65 % Soln nasal spray Commonly known as:  OCEAN Place 1 spray into both nostrils as needed for congestion.   SUMAtriptan 50 MG tablet Commonly known as:  IMITREX   Vitamin E 100 units Tabs Take 1 tablet by mouth daily.

## 2017-12-29 NOTE — Patient Instructions (Signed)
Over the counter Sudafed - ask the pharmacist to help you find it.  Afrin nasal spray: 2 sprays each nostril twice a day for max of 3-4 days

## 2018-01-14 ENCOUNTER — Ambulatory Visit (INDEPENDENT_AMBULATORY_CARE_PROVIDER_SITE_OTHER): Payer: Medicare HMO | Admitting: Family Medicine

## 2018-01-14 ENCOUNTER — Encounter: Payer: Self-pay | Admitting: Family Medicine

## 2018-01-14 VITALS — BP 122/86 | HR 60 | Temp 97.7°F | Wt 211.2 lb

## 2018-01-14 DIAGNOSIS — G8929 Other chronic pain: Secondary | ICD-10-CM | POA: Diagnosis not present

## 2018-01-14 DIAGNOSIS — M25561 Pain in right knee: Secondary | ICD-10-CM | POA: Diagnosis not present

## 2018-01-14 DIAGNOSIS — M25562 Pain in left knee: Secondary | ICD-10-CM | POA: Diagnosis not present

## 2018-01-14 MED ORDER — MELOXICAM 7.5 MG PO TABS: 7.5000 mg | ORAL_TABLET | Freq: Every day | ORAL | 0 refills | Status: DC

## 2018-01-14 NOTE — Progress Notes (Signed)
   Subjective:    Patient ID: Kathy Gamble, female    DOB: [birth year], 42 y.o.   MRN: [id number]  HPI This is a 42 yo female who presents today with bilateral knee pain. Pain with walking, every day, has been diagnosed with arthritis in the past. Takes ibuprofen 400 mg every 4 hours. Walking makes her kneecaps numb. She walks daily but is not sure how far or how long. No falls or weakness. Pain is at top of knee.   "Her cavity turns numb if she holds her urine in the morning. " Denies dysuria, frequency.   Past Medical History:  Diagnosis Date  . Allergy   . Bee sting allergy 03/02/2015  . Chicken pox   . Frequent headaches   . GERD (gastroesophageal reflux disease)    Past Surgical History:  Procedure Laterality Date  . ADENOIDECTOMY    . TONSILLECTOMY     Family History  Problem Relation Age of Onset  . Stroke Mother   . Mental retardation Mother   . Cancer Maternal Grandmother   . Cancer Paternal Grandmother        Breast  . Heart disease Paternal Grandfather    Social History   Tobacco Use  . Smoking status: Never Smoker  . Smokeless tobacco: Never Used  Substance Use Topics  . Alcohol use: No    Alcohol/week: 0.0 oz  . Drug use: No      Review of Systems Per HPI    Objective:   Physical Exam  Constitutional: She is oriented to person, place, and time. She appears well-developed and well-nourished. No distress.  HENT:  Head: Normocephalic and atraumatic.  Cardiovascular: Normal rate.  Pulmonary/Chest: Effort normal.  Musculoskeletal:  Bilateral knees with full ROM, no crepitus. No tenderness to palpation, no laxity or abnormal patellar mobility.   Neurological: She is alert and oriented to person, place, and time.  Skin: She is not diaphoretic.  Vitals reviewed.    BP 122/86   Pulse 60   Temp 97.7 F (36.5 C) (Oral)   Wt 211 lb 4 oz (95.8 kg)   LMP  (LMP Unknown)   SpO2 99%   BMI 32.12 kg/m  LMP- 3 weeks ago.     Assessment & Plan:  1.  Chronic pain of both knees - meloxicam (MOBIC) 7.5 MG tablet; Take 1 tablet (7.5 mg total) by mouth daily. As needed for knee pain. Do not take with ibuprofen.  Dispense: 30 tablet; Refill: 0 - encouraged her to wear supportive shoes and provided exercises for strenghthening - has appointment with PCP next week  Olean Ree, FNP-BC  [facility], [facility]  01/14/2018 1:32 PM

## 2018-01-14 NOTE — Patient Instructions (Signed)
I have sent in a medicine to your pharmacy to take instead of ibuprofen- it is a once a day medicine as needed  If not better in 2 weeks, please follow up with [facility]     Knee Exercises Ask your health care provider which exercises are safe for you. Do exercises exactly as told by your health care provider and adjust them as directed. It is normal to feel mild stretching, pulling, tightness, or discomfort as you do these exercises, but you should stop right away if you feel sudden pain or your pain gets worse.Do not begin these exercises until told by your health care provider. STRETCHING AND RANGE OF MOTION EXERCISES These exercises warm up your muscles and joints and improve the movement and flexibility of your knee. These exercises also help to relieve pain, numbness, and tingling. Exercise A: Knee Extension, Prone 1. Lie on your abdomen on a bed. 2. Place your left / right knee just beyond the edge of the surface so your knee is not on the bed. You can put a towel under your left / right thigh just above your knee for comfort. 3. Relax your leg muscles and allow gravity to straighten your knee. You should feel a stretch behind your left / right knee. 4. Hold this position for __________ seconds. 5. Scoot up so your knee is supported between repetitions. Repeat __________ times. Complete this stretch __________ times a day. Exercise B: Knee Flexion, Active  1. Lie on your back with both knees straight. If this causes back discomfort, bend your left / right knee so your foot is flat on the floor. 2. Slowly slide your left / right heel back toward your buttocks until you feel a gentle stretch in the front of your knee or thigh. 3. Hold this position for __________ seconds. 4. Slowly slide your left / right heel back to the starting position. Repeat __________ times. Complete this exercise __________ times a day. Exercise C: Quadriceps, Prone  1. Lie on your abdomen on a firm surface,  such as a bed or padded floor. 2. Bend your left / right knee and hold your ankle. If you cannot reach your ankle or pant leg, loop a belt around your foot and grab the belt instead. 3. Gently pull your heel toward your buttocks. Your knee should not slide out to the side. You should feel a stretch in the front of your thigh and knee. 4. Hold this position for __________ seconds. Repeat __________ times. Complete this stretch __________ times a day. Exercise D: Hamstring, Supine 1. Lie on your back. 2. Loop a belt or towel over the ball of your left / right foot. The ball of your foot is on the walking surface, right under your toes. 3. Straighten your left / right knee and slowly pull on the belt to raise your leg until you feel a gentle stretch behind your knee. ? Do not let your left / right knee bend while you do this. ? Keep your other leg flat on the floor. 4. Hold this position for __________ seconds. Repeat __________ times. Complete this stretch __________ times a day. STRENGTHENING EXERCISES These exercises build strength and endurance in your knee. Endurance is the ability to use your muscles for a long time, even after they get tired. Exercise E: Quadriceps, Isometric  1. Lie on your back with your left / right leg extended and your other knee bent. Put a rolled towel or small pillow under your knee if  told by your health care provider. 2. Slowly tense the muscles in the front of your left / right thigh. You should see your kneecap slide up toward your hip or see increased dimpling just above the knee. This motion will push the back of the knee toward the floor. 3. For __________ seconds, keep the muscle as tight as you can without increasing your pain. 4. Relax the muscles slowly and completely. Repeat __________ times. Complete this exercise __________ times a day. Exercise F: Straight Leg Raises - Quadriceps 1. Lie on your back with your left / right leg extended and your other  knee bent. 2. Tense the muscles in the front of your left / right thigh. You should see your kneecap slide up or see increased dimpling just above the knee. Your thigh may even shake a bit. 3. Keep these muscles tight as you raise your leg 4-6 inches (10-15 cm) off the floor. Do not let your knee bend. 4. Hold this position for __________ seconds. 5. Keep these muscles tense as you lower your leg. 6. Relax your muscles slowly and completely after each repetition. Repeat __________ times. Complete this exercise __________ times a day. Exercise G: Hamstring, Isometric 1. Lie on your back on a firm surface. 2. Bend your left / right knee approximately __________ degrees. 3. Dig your left / right heel into the surface as if you are trying to pull it toward your buttocks. Tighten the muscles in the back of your thighs to dig as hard as you can without increasing any pain. 4. Hold this position for __________ seconds. 5. Release the tension gradually and allow your muscles to relax completely for __________ seconds after each repetition. Repeat __________ times. Complete this exercise __________ times a day. Exercise H: Hamstring Curls  If told by your health care provider, do this exercise while wearing ankle weights. Begin with __________ weights. Then increase the weight by 1 lb (0.5 kg) increments. Do not wear ankle weights that are more than __________. 1. Lie on your abdomen with your legs straight. 2. Bend your left / right knee as far as you can without feeling pain. Keep your hips flat against the floor. 3. Hold this position for __________ seconds. 4. Slowly lower your leg to the starting position.  Repeat __________ times. Complete this exercise __________ times a day. Exercise I: Squats (Quadriceps) 1. Stand in front of a table, with your feet and knees pointing straight ahead. You may rest your hands on the table for balance but not for support. 2. Slowly bend your knees and lower  your hips like you are going to sit in a chair. ? Keep your weight over your heels, not over your toes. ? Keep your lower legs upright so they are parallel with the table legs. ? Do not let your hips go lower than your knees. ? Do not bend lower than told by your health care provider. ? If your knee pain increases, do not bend as low. 3. Hold the squat position for __________ seconds. 4. Slowly push with your legs to return to standing. Do not use your hands to pull yourself to standing. Repeat __________ times. Complete this exercise __________ times a day. Exercise J: Wall Slides (Quadriceps)  1. Lean your back against a smooth wall or door while you walk your feet out 18-24 inches (46-61 cm) from it. 2. Place your feet hip-width apart. 3. Slowly slide down the wall or door until your knees bend __________ degrees.  Keep your knees over your heels, not over your toes. Keep your knees in line with your hips. 4. Hold for __________ seconds. Repeat __________ times. Complete this exercise __________ times a day. Exercise K: Straight Leg Raises - Hip Abductors 1. Lie on your side with your left / right leg in the top position. Lie so your head, shoulder, knee, and hip line up. You may bend your bottom knee to help you keep your balance. 2. Roll your hips slightly forward so your hips are stacked directly over each other and your left / right knee is facing forward. 3. Leading with your heel, lift your top leg 4-6 inches (10-15 cm). You should feel the muscles in your outer hip lifting. ? Do not let your foot drift forward. ? Do not let your knee roll toward the ceiling. 4. Hold this position for __________ seconds. 5. Slowly return your leg to the starting position. 6. Let your muscles relax completely after each repetition. Repeat __________ times. Complete this exercise __________ times a day. Exercise L: Straight Leg Raises - Hip Extensors 1. Lie on your abdomen on a firm surface. You can  put a pillow under your hips if that is more comfortable. 2. Tense the muscles in your buttocks and lift your left / right leg about 4-6 inches (10-15 cm). Keep your knee straight as you lift your leg. 3. Hold this position for __________ seconds. 4. Slowly lower your leg to the starting position. 5. Let your leg relax completely after each repetition. Repeat __________ times. Complete this exercise __________ times a day. This information is not intended to replace advice given to you by your health care provider. Make sure you discuss any questions you have with your health care provider. Document Released: 10/14/2005 Document Revised: 08/24/2016 Document Reviewed: 10/06/2015 Elsevier Interactive Patient Education  2018 [employer].

## 2018-02-12 ENCOUNTER — Other Ambulatory Visit: Payer: Self-pay | Admitting: Internal Medicine

## 2018-02-17 ENCOUNTER — Encounter: Payer: Self-pay | Admitting: Internal Medicine

## 2018-02-17 ENCOUNTER — Ambulatory Visit (INDEPENDENT_AMBULATORY_CARE_PROVIDER_SITE_OTHER): Payer: Medicare HMO | Admitting: Internal Medicine

## 2018-02-17 VITALS — BP 114/76 | HR 93 | Temp 97.9°F | Wt 209.0 lb

## 2018-02-17 DIAGNOSIS — J3089 Other allergic rhinitis: Secondary | ICD-10-CM | POA: Diagnosis not present

## 2018-02-18 ENCOUNTER — Encounter: Payer: Self-pay | Admitting: Internal Medicine

## 2018-02-18 MED ORDER — METHYLPREDNISOLONE ACETATE 80 MG/ML IJ SUSP: 80.0000 mg | Freq: Once | INTRAMUSCULAR | Status: DC

## 2018-02-18 NOTE — Patient Instructions (Signed)

## 2018-02-18 NOTE — Progress Notes (Signed)
Subjective:    Patient ID: Kathy Gamble, female    DOB: 1975-12-26, 42 y.o.   MRN: [id number]  HPI  Pt presents to the clinic today with c/o ear fullness, nasal congestion and sore throat. She reports this started 1-2 weeks ago. She is not blowing anything out of her nose. She denies ear pain or decreased hearing. She denies difficulty swallowing. She denies fever, chills or body aches. She has been taking Zyrtec and nasal saline with some relief. She has had sick contacts.  Review of Systems  Past Medical History:  Diagnosis Date  . Allergy   . Bee sting allergy 03/02/2015  . Chicken pox   . Frequent headaches   . GERD (gastroesophageal reflux disease)     Current Outpatient Medications  Medication Sig Dispense Refill  . albuterol (PROVENTIL HFA;VENTOLIN HFA) 108 (90 Base) MCG/ACT inhaler Inhale 1-2 puffs every 6 (six) hours as needed into the lungs for wheezing or shortness of breath. 1 Inhaler 0  . fluticasone (FLONASE) 50 MCG/ACT nasal spray Place 1 spray into both nostrils 2 (two) times daily. 16 g 0  . Fluticasone Furoate (ARNUITY ELLIPTA) 100 MCG/ACT AEPB Inhale 1 puff daily into the lungs. 30 each 2  . levocetirizine (XYZAL) 5 MG tablet TAKE ONE TABLET BY MOUTH IN THE EVENING 30 tablet 0  . meloxicam (MOBIC) 7.5 MG tablet Take 1 tablet (7.5 mg total) by mouth daily. As needed for knee pain. Do not take with ibuprofen. 30 tablet 0  . omeprazole (PRILOSEC) 40 MG capsule TAKE ONE CAPSULE BY MOUTH DAILY 30 capsule 4  . sodium chloride (OCEAN) 0.65 % SOLN nasal spray Place 1 spray into both nostrils as needed for congestion.    . SUMAtriptan (IMITREX) 50 MG tablet     . Vitamin E 100 units TABS Take 1 tablet by mouth daily.     No current facility-administered medications for this visit.     Allergies  Allergen Reactions  . Bee Venom Itching  . Latex Rash    The powder in the gloves causes rash    Family History  Problem Relation Age of Onset  . Stroke Mother   .  Mental retardation Mother   . Cancer Maternal Grandmother   . Cancer Paternal Grandmother        Breast  . Heart disease Paternal Grandfather     Social History   Socioeconomic History  . Marital status: Married    Spouse name: Not on file  . Number of children: Not on file  . Years of education: Not on file  . Highest education level: Not on file  Social Needs  . Financial resource strain: Not on file  . Food insecurity - worry: Not on file  . Food insecurity - inability: Not on file  . Transportation needs - medical: Not on file  . Transportation needs - non-medical: Not on file  Occupational History  . Not on file  Tobacco Use  . Smoking status: Never Smoker  . Smokeless tobacco: Never Used  Substance and Sexual Activity  . Alcohol use: No    Alcohol/week: 0.0 oz  . Drug use: No  . Sexual activity: Yes    Birth control/protection: None  Other Topics Concern  . Not on file  Social History Narrative  . Not on file     Constitutional: Denies fever, malaise, fatigue, headache or abrupt weight changes.  HEENT: Pt reports ear fullness, nasal congestion, sore throat. Denies eye pain, eye  redness, ear pain, ringing in the ears, wax buildup, runny nose, bloody nose. Respiratory: Denies difficulty breathing, shortness of breath, cough or sputum production.    No other specific complaints in a complete review of systems (except as listed in HPI above).      Objective:   Physical Exam  BP 114/76   Pulse 93   Temp 97.9 F (36.6 C) (Oral)   Wt 209 lb (94.8 kg)   SpO2 99%   BMI 31.78 kg/m  Wt Readings from Last 3 Encounters:  02/17/18 209 lb (94.8 kg)  01/14/18 211 lb 4 oz (95.8 kg)  12/29/17 208 lb 8 oz (94.6 kg)    General: Appears her stated age, in NAD. HEENT: Head: normal shape and size, no sinus tenderness noted; Ears: Tm's gray and intact, normal light reflex, bilateral serous effusions noted; Nose: mucosa pink and moist, turbinates swollen; Throat/Mouth:  Teeth present, mucosa pink and moist, + PND, no exudate, lesions or ulcerations noted.  Neck:  No adenopathy noted. Pulmonary/Chest: Normal effort and positive vesicular breath sounds. No respiratory distress. No wheezes, rales or ronchi noted.   BMET    Component Value Date/Time   NA 139 06/30/2017 1531   K 3.7 06/30/2017 1531   CL 108 06/30/2017 1531   CO2 25 06/30/2017 1531   GLUCOSE 82 06/30/2017 1531   BUN 23 06/30/2017 1531   CREATININE 0.75 06/30/2017 1531   CALCIUM 8.7 06/30/2017 1531   GFRNONAA >60 09/22/2010 1020   GFRAA  09/22/2010 1020    >60        The eGFR has been calculated using the MDRD equation. This calculation has not been validated in all clinical situations. eGFR's persistently <60 mL/min signify possible Chronic Kidney Disease.    Lipid Panel     Component Value Date/Time   CHOL 131 06/30/2017 1531   TRIG 61.0 06/30/2017 1531   HDL 45.70 06/30/2017 1531   CHOLHDL 3 06/30/2017 1531   VLDL 12.2 06/30/2017 1531   LDLCALC 73 06/30/2017 1531    CBC    Component Value Date/Time   WBC 6.6 06/30/2017 1531   RBC 4.20 06/30/2017 1531   HGB 13.0 06/30/2017 1531   HCT 38.5 06/30/2017 1531   PLT 257.0 06/30/2017 1531   MCV 91.7 06/30/2017 1531   MCH 30.2 09/03/2011 1454   MCHC 33.8 06/30/2017 1531   RDW 13.1 06/30/2017 1531   LYMPHSABS 2.9 05/17/2015 1334   MONOABS 0.3 05/17/2015 1334   EOSABS 0.1 05/17/2015 1334   BASOSABS 0.0 05/17/2015 1334    Hgb A1C Lab Results  Component Value Date   HGBA1C 4.7 06/30/2017            Assessment & Plan:   Allergic Rhinitis:  Advised her to start Flonase Continue Zyrtec  80 mg Depo IM today  RTC as needed or if symptoms persist or worsen Webb Silversmith, NP

## 2018-02-21 ENCOUNTER — Other Ambulatory Visit: Payer: Self-pay | Admitting: Internal Medicine

## 2018-03-18 ENCOUNTER — Other Ambulatory Visit: Payer: Self-pay

## 2018-03-18 MED ORDER — LEVOCETIRIZINE DIHYDROCHLORIDE 5 MG PO TABS: 5.0000 mg | ORAL_TABLET | Freq: Every evening | ORAL | 1 refills | Status: DC

## 2018-03-29 ENCOUNTER — Encounter: Payer: Self-pay | Admitting: Family Medicine

## 2018-03-29 ENCOUNTER — Ambulatory Visit (INDEPENDENT_AMBULATORY_CARE_PROVIDER_SITE_OTHER): Payer: Medicare HMO | Admitting: Family Medicine

## 2018-03-29 DIAGNOSIS — H6980 Other specified disorders of Eustachian tube, unspecified ear: Secondary | ICD-10-CM | POA: Diagnosis not present

## 2018-03-29 MED ORDER — PREDNISONE 20 MG PO TABS: 20.0000 mg | ORAL_TABLET | Freq: Every day | ORAL | 0 refills | Status: DC

## 2018-03-29 NOTE — Patient Instructions (Signed)
Stop meloxicam for now.  Change to prednisone with food.  Continue flonase and gently try to pop your ears.  Take care.  Glad to see you.  Update [place] as needed.

## 2018-03-29 NOTE — Progress Notes (Signed)
B ear pain.  She is on flonase and arnuity at baseline.  She hasn't had to use SABA recently.  Ear pain started about 1 week ago.  Pain is constant.  Hearing is muffled a little, variable.  Some rhinorrhea, clear.  No fevers.  Some cough. Some HA.  No vomiting, no diarrhea.  Some occ clear sputum with throat clearing.  Taking meloxicam and singulair at baseline.  On PPI at baseline.    Meds, vitals, and allergies reviewed.   ROS: Per HPI unless specifically indicated in ROS section   GEN: nad, alert and oriented HEENT: mucous membranes moist, tm w/o erythema, nasal exam w/o erythema, clear discharge noted,  OP with cobblestoning NECK: supple w/o LA CV: rrr.   PULM: ctab, no inc wob EXT: no edema  No TM movement on valsalva.

## 2018-03-30 DIAGNOSIS — H6990 Unspecified Eustachian tube disorder, unspecified ear: Secondary | ICD-10-CM | POA: Insufficient documentation

## 2018-03-30 DIAGNOSIS — H6980 Other specified disorders of Eustachian tube, unspecified ear: Secondary | ICD-10-CM | POA: Insufficient documentation

## 2018-03-30 NOTE — Assessment & Plan Note (Signed)
D/w pt.  Stop meloxicam for now.  Change to prednisone with food.  Routine steroid cautions given. Continue flonase and gently attempted Valsalva. Update [place] as needed.  All d/w pt.

## 2018-04-08 ENCOUNTER — Other Ambulatory Visit: Payer: Self-pay | Admitting: Family Medicine

## 2018-04-08 DIAGNOSIS — M25562 Pain in left knee: Principal | ICD-10-CM

## 2018-04-08 DIAGNOSIS — M25561 Pain in right knee: Principal | ICD-10-CM

## 2018-04-08 DIAGNOSIS — G8929 Other chronic pain: Secondary | ICD-10-CM

## 2018-04-11 NOTE — Telephone Encounter (Signed)
Last filled by Deboraha Sprang 01/2018... Please advise

## 2018-04-11 NOTE — Telephone Encounter (Signed)
Please Advise

## 2018-05-04 ENCOUNTER — Ambulatory Visit (INDEPENDENT_AMBULATORY_CARE_PROVIDER_SITE_OTHER): Payer: Medicare HMO | Admitting: Internal Medicine

## 2018-05-04 ENCOUNTER — Encounter: Payer: Self-pay | Admitting: Internal Medicine

## 2018-05-04 VITALS — BP 108/78 | HR 55 | Temp 97.8°F | Wt 200.0 lb

## 2018-05-04 DIAGNOSIS — H6693 Otitis media, unspecified, bilateral: Secondary | ICD-10-CM | POA: Diagnosis not present

## 2018-05-04 MED ORDER — CEFDINIR 300 MG PO CAPS
300.0000 mg | ORAL_CAPSULE | Freq: Two times a day (BID) | ORAL | 0 refills | Status: DC
Start: 2018-05-04 — End: 2018-05-23

## 2018-05-04 NOTE — Patient Instructions (Signed)

## 2018-05-04 NOTE — Progress Notes (Signed)
HPI  Pt presents to the clinic today with c/o nasal congestion and ear pain. This started 1 week ago. She is blowing clear, blood tinged mucous out of her nose. She describes the ear pain as sore, achy and full. She denies drainage from the area. She denies loss of hearing. She denies fever, chills or body aches. She has tried [place] nasal spray, Xyzal, Singulair and Flonase. She does follow with ENT.  Review of Systems     Past Medical History:  Diagnosis Date  . Allergy   . Bee sting allergy 03/02/2015  . Chicken pox   . Frequent headaches   . GERD (gastroesophageal reflux disease)     Family History  Problem Relation Age of Onset  . Stroke Mother   . Mental retardation Mother   . Cancer Maternal Grandmother   . Cancer Paternal Grandmother        Breast  . Heart disease Paternal Grandfather     Social History   Socioeconomic History  . Marital status: Married    Spouse name: Not on file  . Number of children: Not on file  . Years of education: Not on file  . Highest education level: Not on file  Occupational History  . Not on file  Social Needs  . Financial resource strain: Not on file  . Food insecurity:    Worry: Not on file    Inability: Not on file  . Transportation needs:    Medical: Not on file    Non-medical: Not on file  Tobacco Use  . Smoking status: Never Smoker  . Smokeless tobacco: Never Used  Substance and Sexual Activity  . Alcohol use: No    Alcohol/week: 0.0 oz  . Drug use: No  . Sexual activity: Yes    Birth control/protection: None  Lifestyle  . Physical activity:    Days per week: Not on file    Minutes per session: Not on file  . Stress: Not on file  Relationships  . Social connections:    Talks on phone: Not on file    Gets together: Not on file    Attends religious service: Not on file    Active member of club or organization: Not on file    Attends meetings of clubs or organizations: Not on file    Relationship status: Not on  file  . Intimate partner violence:    Fear of current or ex partner: Not on file    Emotionally abused: Not on file    Physically abused: Not on file    Forced sexual activity: Not on file  Other Topics Concern  . Not on file  Social History Narrative  . Not on file    Allergies  Allergen Reactions  . Bee Venom Itching  . Latex Rash    The powder in the gloves causes rash     Constitutional:  Denies headache, fatigue, fever or abrupt weight changes.  HEENT:  Positive nasal congestion and ear pain. Denies eye redness, ringing in the ears, wax buildup, runny nose or sore throat. Respiratory: Denies cough, difficulty breathing or shortness of breath.  Cardiovascular: Denies chest pain, chest tightness, palpitations or swelling in the hands or feet.   No other specific complaints in a complete review of systems (except as listed in HPI above).  Objective:   BP 108/78   Pulse (!) 55   Temp 97.8 F (36.6 C) (Oral)   Wt 200 lb (90.7 kg)   SpO2  99%   BMI 30.41 kg/m   General: Appears her stated age, in NAD. HEENT: Head: normal shape and size, no sinus tenderness noted; Ears: Tm's red, retracted, distorted light reflex, mucous noted behind TM; Nose: mucosa pink and moist, septum midline; Throat/Mouth: Teeth present, mucosa pink and moist, no exudate noted, no lesions or ulcerations noted.  Neck:  No adenopathy noted.  Cardiovascular: Bradycardic with normal rhythm.  Pulmonary/Chest: Normal effort and positive vesicular breath sounds. No respiratory distress. No wheezes, rales or ronchi noted.       Assessment & Plan:   Bilateral Otitis Media:  Continue Xyzal, Singulair, Flonase and Ocean Spray eRx for Ceftin BID for 10 days  RTC as needed or if symptoms persist. Nicki Reaper, NP

## 2018-05-16 ENCOUNTER — Other Ambulatory Visit: Payer: Self-pay | Admitting: Internal Medicine

## 2018-05-16 DIAGNOSIS — M25562 Pain in left knee: Principal | ICD-10-CM

## 2018-05-16 DIAGNOSIS — G8929 Other chronic pain: Secondary | ICD-10-CM

## 2018-05-16 DIAGNOSIS — M25561 Pain in right knee: Principal | ICD-10-CM

## 2018-05-16 NOTE — Telephone Encounter (Signed)
Last filled 04/11/18... Please advise 

## 2018-05-19 ENCOUNTER — Other Ambulatory Visit: Payer: Self-pay | Admitting: Internal Medicine

## 2018-05-23 ENCOUNTER — Other Ambulatory Visit: Payer: Self-pay | Admitting: Internal Medicine

## 2018-05-23 ENCOUNTER — Ambulatory Visit (INDEPENDENT_AMBULATORY_CARE_PROVIDER_SITE_OTHER): Payer: Medicare HMO | Admitting: Internal Medicine

## 2018-05-23 ENCOUNTER — Encounter: Payer: Self-pay | Admitting: Internal Medicine

## 2018-05-23 VITALS — BP 118/78 | HR 84 | Temp 98.2°F | Ht 68.0 in | Wt 197.2 lb

## 2018-05-23 DIAGNOSIS — H9203 Otalgia, bilateral: Secondary | ICD-10-CM

## 2018-05-23 NOTE — Telephone Encounter (Signed)
Copied from CRM [id number]. Topic: Quick Communication - Rx Refill/Question >> May 23, 2018  4:40 PM Mickel Baas B, NT wrote: Medication: topiramate (TOPAMAX) 100 MG tablet topiramate (TOPAMAX) 25 MG tablet omeprazole (PRILOSEC) 40 MG capsule montelukast (SINGULAIR) 10 MG tablet   Has the patient contacted their pharmacy? Yes.   (Agent: If no, request that the patient contact the pharmacy for the refill.) (Agent: If yes, when and what did the pharmacy advise?)  Preferred Pharmacy (with phone number or street name): [id number] - [place], [place] - [address]  Agent: Please be advised that RX refills may take up to 3 business days. We ask that you follow-up with your pharmacy.

## 2018-05-23 NOTE — Progress Notes (Signed)
Subjective:    Patient ID: Kathy Gamble, female    DOB: [birth year], 42 y.o.   MRN: [id number]  HPI  Pt presents to the clinic today with c/o bilateral ear pain. She reports this has been going on for weeks. She describes the pain as sharp, stabbing and itching. She denies decreased hearing. She was seen 5/22 for the same. She was treated for a bilateral otitis media with Omnicef. She reports she took all the abx as prescribed., even though it caused her some GI upset. She has taken Ocean spray, Xyzal and Singulair as prescribed. She has an ENT but has not seen them recently.  Review of Systems      Past Medical History:  Diagnosis Date  . Allergy   . Bee sting allergy 03/02/2015  . Chicken pox   . Frequent headaches   . GERD (gastroesophageal reflux disease)     Current Outpatient Medications  Medication Sig Dispense Refill  . albuterol (PROVENTIL HFA;VENTOLIN HFA) 108 (90 Base) MCG/ACT inhaler Inhale 1-2 puffs every 6 (six) hours as needed into the lungs for wheezing or shortness of breath. 1 Inhaler 0  . fluticasone (FLONASE) 50 MCG/ACT nasal spray Place 1 spray into both nostrils 2 (two) times daily. 16 g 0  . levocetirizine (XYZAL) 5 MG tablet Take 1 tablet (5 mg total) by mouth every evening. 90 tablet 1  . meloxicam (MOBIC) 7.5 MG tablet TAKE 1 TABLET (7.5 MG TOTAL) BY MOUTH DAILY. AS NEEDED FOR KNEE PAIN. DO NOT TAKE WITH IBUPROFEN. 30 tablet 2  . montelukast (SINGULAIR) 10 MG tablet TAKE 1 TABLET BY MOUTH DAILY FOR SINUSES. 90 tablet 1  . omeprazole (PRILOSEC) 40 MG capsule TAKE ONE CAPSULE BY MOUTH DAILY 30 capsule 4  . sodium chloride (OCEAN) 0.65 % SOLN nasal spray Place 1 spray into both nostrils as needed for congestion.    . SUMAtriptan (IMITREX) 50 MG tablet     . Vitamin E 100 units TABS Take 1 tablet by mouth daily.     No current facility-administered medications for this visit.     Allergies  Allergen Reactions  . Bee Venom Itching  . Latex Rash    The  powder in the gloves causes rash    Family History  Problem Relation Age of Onset  . Stroke Mother   . Mental retardation Mother   . Cancer Maternal Grandmother   . Cancer Paternal Grandmother        Breast  . Heart disease Paternal Grandfather     Social History   Socioeconomic History  . Marital status: Married    Spouse name: Not on file  . Number of children: Not on file  . Years of education: Not on file  . Highest education level: Not on file  Occupational History  . Not on file  Social Needs  . Financial resource strain: Not on file  . Food insecurity:    Worry: Not on file    Inability: Not on file  . Transportation needs:    Medical: Not on file    Non-medical: Not on file  Tobacco Use  . Smoking status: Never Smoker  . Smokeless tobacco: Never Used  Substance and Sexual Activity  . Alcohol use: No    Alcohol/week: 0.0 oz  . Drug use: Yes    Types: Cocaine  . Sexual activity: Yes    Birth control/protection: None  Lifestyle  . Physical activity:    Days per week: Not on  file    Minutes per session: Not on file  . Stress: Not on file  Relationships  . Social connections:    Talks on phone: Not on file    Gets together: Not on file    Attends religious service: Not on file    Active member of club or organization: Not on file    Attends meetings of clubs or organizations: Not on file    Relationship status: Not on file  . Intimate partner violence:    Fear of current or ex partner: Not on file    Emotionally abused: Not on file    Physically abused: Not on file    Forced sexual activity: Not on file  Other Topics Concern  . Not on file  Social History Narrative  . Not on file     Constitutional: Denies fever, malaise, fatigue, headache or abrupt weight changes.  HEENT: Pt reports bilateral ear pain. Denies eye pain, eye redness, ringing in the ears, wax buildup, runny nose, nasal congestion, bloody nose, or sore throat. Respiratory: Denies  difficulty breathing, shortness of breath, cough or sputum production.    No other specific complaints in a complete review of systems (except as listed in HPI above).  Objective:   Physical Exam   BP 118/78   Pulse 84   Temp 98.2 F (36.8 C) (Oral)   Ht '5\' 8"'$  (1.727 m)   Wt 197 lb 4 oz (89.5 kg)   LMP 05/02/2018   BMI 29.99 kg/m  Wt Readings from Last 3 Encounters:  05/23/18 197 lb 4 oz (89.5 kg)  05/04/18 200 lb (90.7 kg)  03/29/18 207 lb 4 oz (94 kg)    General: Appears her stated age, in NAD. HEENT: Head: normal shape and size; Ears: scarring noted of bilateral TM's. Left TM intact. Right TM with possible perforation. No pus noted behind the TM. No drainage noted in the external canals. Neck:  No adenopathy noted.  BMET    Component Value Date/Time   NA 139 06/30/2017 1531   K 3.7 06/30/2017 1531   CL 108 06/30/2017 1531   CO2 25 06/30/2017 1531   GLUCOSE 82 06/30/2017 1531   BUN 23 06/30/2017 1531   CREATININE 0.75 06/30/2017 1531   CALCIUM 8.7 06/30/2017 1531   GFRNONAA >60 09/22/2010 1020   GFRAA  09/22/2010 1020    >60        The eGFR has been calculated using the MDRD equation. This calculation has not been validated in all clinical situations. eGFR's persistently <60 mL/min signify possible Chronic Kidney Disease.    Lipid Panel     Component Value Date/Time   CHOL 131 06/30/2017 1531   TRIG 61.0 06/30/2017 1531   HDL 45.70 06/30/2017 1531   CHOLHDL 3 06/30/2017 1531   VLDL 12.2 06/30/2017 1531   LDLCALC 73 06/30/2017 1531    CBC    Component Value Date/Time   WBC 6.6 06/30/2017 1531   RBC 4.20 06/30/2017 1531   HGB 13.0 06/30/2017 1531   HCT 38.5 06/30/2017 1531   PLT 257.0 06/30/2017 1531   MCV 91.7 06/30/2017 1531   MCH 30.2 09/03/2011 1454   MCHC 33.8 06/30/2017 1531   RDW 13.1 06/30/2017 1531   LYMPHSABS 2.9 05/17/2015 1334   MONOABS 0.3 05/17/2015 1334   EOSABS 0.1 05/17/2015 1334   BASOSABS 0.0 05/17/2015 1334    Hgb  A1C Lab Results  Component Value Date   HGBA1C 4.7 06/30/2017  Assessment & Plan:   Otalgia:  Infection resolved No need for additional abx Try Flonase x 1 week in addition to your Ocean spray, Xyzal and Singulair  Follow up with ENT if ear pain persist or worsens Webb Silversmith, NP

## 2018-05-23 NOTE — Patient Instructions (Signed)

## 2018-05-24 NOTE — Telephone Encounter (Signed)
Prilosec has not been filled since 2017 and I do not see where you have ever filled the Topamax but in historical meds... Please advise

## 2018-05-24 NOTE — Telephone Encounter (Signed)
Patient is requesting historical medications: Topamax 25 mg and 100 mg Prilosec 40 mg      Last filled: 07/28/16 # 30  4 RF  Patient should have RF of Singular- at least 1 more.  LOV: 05/23/18 acute  PCP: Baity  [facility]

## 2018-05-24 NOTE — Telephone Encounter (Signed)
Can you please call her and ask if she has been taking the Prilosec or Topomax? If not, why is she requesting refills? Is she having headaches, reflux symptoms?

## 2018-05-28 ENCOUNTER — Other Ambulatory Visit: Payer: Self-pay | Admitting: Internal Medicine

## 2018-05-30 ENCOUNTER — Other Ambulatory Visit: Payer: Self-pay | Admitting: Family Medicine

## 2018-05-30 ENCOUNTER — Other Ambulatory Visit: Payer: Self-pay | Admitting: Internal Medicine

## 2018-05-30 DIAGNOSIS — J302 Other seasonal allergic rhinitis: Secondary | ICD-10-CM

## 2018-05-30 NOTE — Telephone Encounter (Signed)
Called pt but error msg stated that phone can not receive incoming calls

## 2018-05-31 MED ORDER — ALBUTEROL SULFATE HFA 108 (90 BASE) MCG/ACT IN AERS: 1.0000 | INHALATION_SPRAY | Freq: Four times a day (QID) | RESPIRATORY_TRACT | 0 refills | Status: DC | PRN

## 2018-05-31 NOTE — Telephone Encounter (Signed)
This was being filled by [facility]...inhaler last prescribed by Dr Para March 10/2017... Please advise if okay to refill

## 2018-06-20 DIAGNOSIS — H5213 Myopia, bilateral: Secondary | ICD-10-CM | POA: Diagnosis not present

## 2018-06-23 ENCOUNTER — Ambulatory Visit: Payer: Self-pay

## 2018-06-23 NOTE — Telephone Encounter (Signed)
This encounter was created in error - please disregard.

## 2018-06-23 NOTE — Telephone Encounter (Signed)
  Reason for Disposition . [1] After 3 days (72 hours) AND [2] back pain not improving  Answer Assessment - Initial Assessment Questions 1. MECHANISM: "How did the injury happen?" (Consider the possibility of domestic violence or elder abuse)     Fall 2. ONSET: "When did the injury happen?" (Minutes or hours ago)     Today at 2 pm 3. LOCATION: "What part of the back is injured?"     Lower 4. SEVERITY: "Can you move the back normally?"     Yes, but stiff 5. PAIN: "Is there any pain?" If so, ask: "How bad is the pain?"   (Scale 1-10; or mild, moderate, severe)     5 6. CORD SYMPTOMS: Any weakness or numbness of the arms or legs?"     No 7. SIZE: For cuts, bruises, or swelling, ask: "How large is it?" (e.g., inches or centimeters)     Bruises to knees and legs 8. TETANUS: For any breaks in the skin, ask: "When was the last tetanus booster?"     N/A 9. OTHER SYMPTOMS: "Do you have any other symptoms?" (e.g., abdominal pain, blood in urine)     Neck soreness 10. PREGNANCY: "Is there any chance you are pregnant?" "When was your last menstrual period?"       No  Protocols used: BACK INJURY-A-AH

## 2018-06-23 NOTE — Telephone Encounter (Signed)
Patient called, left VM on [phone] to call the office back to speak to a TN about her symptoms.

## 2018-06-23 NOTE — Telephone Encounter (Signed)
Patient called, left VM to return call to the office to discuss symptoms. 

## 2018-06-23 NOTE — Telephone Encounter (Signed)
Patient called in with c/o "fall, back pain." She says "I fell today around 2 pm and hurt my lower back. I have bruises to my legs and knees. I can move my back, but it's stiff. The pain is a 5." I asked about weakness, numbness to legs, she denies. I asked about other symptoms, she says "just neck soreness." According to protocol, see PCP within 3 days, no availability with PCP, appointment scheduled for tomorrow at 1600 with Olean Ree, FNP, care advice given, patient verbalized understanding.  Reason for Disposition . [1] MODERATE back pain (e.g., interferes with normal activities) AND [2] present > 3 days  Answer Assessment - Initial Assessment Questions 1. ONSET: "When did the pain begin?"      Today from the fall 2. LOCATION: "Where does it hurt?" (upper, mid or lower back)     Lower back 3. SEVERITY: "How bad is the pain?"  (e.g., Scale 1-10; mild, moderate, or severe)   - MILD (1-3): doesn't interfere with normal activities    - MODERATE (4-7): interferes with normal activities or awakens from sleep    - SEVERE (8-10): excruciating pain, unable to do any normal activities      5 4. PATTERN: "Is the pain constant?" (e.g., yes, no; constant, intermittent)      Constant 5. RADIATION: "Does the pain shoot into your legs or elsewhere?"     No 6. CAUSE:  "What do you think is causing the back pain?"      From the fall today 7. BACK OVERUSE:  "Any recent lifting of heavy objects, strenuous work or exercise?"     No 8. MEDICATIONS: "What have you taken so far for the pain?" (e.g., nothing, acetaminophen, NSAIDS)     Nothing 9. NEUROLOGIC SYMPTOMS: "Do you have any weakness, numbness, or problems with bowel/bladder control?"     No 10. OTHER SYMPTOMS: "Do you have any other symptoms?" (e.g., fever, abdominal pain, burning with urination, blood in urine)      Neck sorness 11. PREGNANCY: "Is there any chance you are pregnant?" (e.g., yes, no; LMP)       No  Protocols used: BACK  PAIN-A-AH

## 2018-06-24 ENCOUNTER — Ambulatory Visit: Payer: Medicare HMO | Admitting: Family Medicine

## 2018-06-24 DIAGNOSIS — Z0289 Encounter for other administrative examinations: Secondary | ICD-10-CM

## 2018-06-27 ENCOUNTER — Ambulatory Visit (INDEPENDENT_AMBULATORY_CARE_PROVIDER_SITE_OTHER): Payer: Medicare HMO | Admitting: Internal Medicine

## 2018-06-27 ENCOUNTER — Encounter: Payer: Self-pay | Admitting: Internal Medicine

## 2018-06-27 VITALS — BP 110/78 | HR 68 | Temp 97.9°F | Wt 194.0 lb

## 2018-06-27 DIAGNOSIS — Y92009 Unspecified place in unspecified non-institutional (private) residence as the place of occurrence of the external cause: Secondary | ICD-10-CM | POA: Diagnosis not present

## 2018-06-27 DIAGNOSIS — S29012A Strain of muscle and tendon of back wall of thorax, initial encounter: Secondary | ICD-10-CM | POA: Diagnosis not present

## 2018-06-27 DIAGNOSIS — W19XXXA Unspecified fall, initial encounter: Secondary | ICD-10-CM

## 2018-06-27 MED ORDER — METHOCARBAMOL 500 MG PO TABS: 500.0000 mg | ORAL_TABLET | Freq: Every evening | ORAL | 0 refills | Status: DC | PRN

## 2018-06-27 NOTE — Patient Instructions (Signed)

## 2018-06-27 NOTE — Progress Notes (Signed)
Subjective:    Patient ID: Kathy Gamble, female    DOB: [birth year], 42 y.o.   MRN: [id number]  HPI  Pt presents to the clinic today with c/o low back pain. This started after a few falls that occurred 1 week. The first time, she fell over her son. The second time she tripped and landed on her hands and knees. She reports bruises to her knees. She describes the back pain as sore and achy. The pain does not radiate. She denies numbness, tingling or weakness in her legs. She has tried Tylenol, Ibuprofen and Execdrin with some relief.   Review of Systems      Past Medical History:  Diagnosis Date  . Allergy   . Bee sting allergy 03/02/2015  . Chicken pox   . Frequent headaches   . GERD (gastroesophageal reflux disease)     Current Outpatient Medications  Medication Sig Dispense Refill  . albuterol (PROVENTIL HFA;VENTOLIN HFA) 108 (90 Base) MCG/ACT inhaler Inhale 1-2 puffs into the lungs every 6 (six) hours as needed for wheezing or shortness of breath. 1 Inhaler 0  . fluticasone (FLONASE) 50 MCG/ACT nasal spray Place 1 spray into both nostrils 2 (two) times daily. 16 g 0  . levocetirizine (XYZAL) 5 MG tablet Take 1 tablet (5 mg total) by mouth every evening. 90 tablet 1  . meloxicam (MOBIC) 7.5 MG tablet TAKE 1 TABLET (7.5 MG TOTAL) BY MOUTH DAILY. AS NEEDED FOR KNEE PAIN. DO NOT TAKE WITH IBUPROFEN. 30 tablet 2  . montelukast (SINGULAIR) 10 MG tablet TAKE 1 TABLET BY MOUTH DAILY FOR SINUSES. 90 tablet 0  . omeprazole (PRILOSEC) 40 MG capsule TAKE ONE CAPSULE BY MOUTH DAILY 30 capsule 4  . sodium chloride (OCEAN) 0.65 % SOLN nasal spray Place 1 spray into both nostrils as needed for congestion.    . SUMAtriptan (IMITREX) 50 MG tablet     . Vitamin E 100 units TABS Take 1 tablet by mouth daily.     No current facility-administered medications for this visit.     Allergies  Allergen Reactions  . Bee Venom Itching  . Latex Rash    The powder in the gloves causes rash    Family  History  Problem Relation Age of Onset  . Stroke Mother   . Mental retardation Mother   . Cancer Maternal Grandmother   . Cancer Paternal Grandmother        Breast  . Heart disease Paternal Grandfather     Social History   Socioeconomic History  . Marital status: Married    Spouse name: Not on file  . Number of children: Not on file  . Years of education: Not on file  . Highest education level: Not on file  Occupational History  . Not on file  Social Needs  . Financial resource strain: Not on file  . Food insecurity:    Worry: Not on file    Inability: Not on file  . Transportation needs:    Medical: Not on file    Non-medical: Not on file  Tobacco Use  . Smoking status: Never Smoker  . Smokeless tobacco: Never Used  Substance and Sexual Activity  . Alcohol use: No    Alcohol/week: 0.0 oz  . Drug use: Yes    Types: Cocaine  . Sexual activity: Yes    Birth control/protection: None  Lifestyle  . Physical activity:    Days per week: Not on file    Minutes per  session: Not on file  . Stress: Not on file  Relationships  . Social connections:    Talks on phone: Not on file    Gets together: Not on file    Attends religious service: Not on file    Active member of club or organization: Not on file    Attends meetings of clubs or organizations: Not on file    Relationship status: Not on file  . Intimate partner violence:    Fear of current or ex partner: Not on file    Emotionally abused: Not on file    Physically abused: Not on file    Forced sexual activity: Not on file  Other Topics Concern  . Not on file  Social History Narrative  . Not on file     Constitutional: Denies fever, malaise, fatigue, headache or abrupt weight changes.  Musculoskeletal: Pt reports back pain. Denies decrease in range of motion, difficulty with gait, or joint swelling.  Skin: Pt reports bruising to knees. Denies redness, rashes, lesions or ulcercations.    No other specific  complaints in a complete review of systems (except as listed in HPI above).  Objective:   Physical Exam  BP 110/78   Pulse 68   Temp 97.9 F (36.6 C) (Oral)   Wt 194 lb (88 kg)   SpO2 98%   BMI 29.50 kg/m  Wt Readings from Last 3 Encounters:  06/27/18 194 lb (88 kg)  05/23/18 197 lb 4 oz (89.5 kg)  05/04/18 200 lb (90.7 kg)    General: Appears her stated age, well developed, well nourished in NAD. Skin: Warm, dry and intact. Bruising noted to bilateral anterior knees. Musculoskeletal: Normal flexion, extension and rotation of the spine. No bony tenderness noted over the spine. Pain with palpation of bilateral parathoracic muscles. Neurological: Alert and oriented.   BMET    Component Value Date/Time   NA 139 06/30/2017 1531   K 3.7 06/30/2017 1531   CL 108 06/30/2017 1531   CO2 25 06/30/2017 1531   GLUCOSE 82 06/30/2017 1531   BUN 23 06/30/2017 1531   CREATININE 0.75 06/30/2017 1531   CALCIUM 8.7 06/30/2017 1531   GFRNONAA >60 09/22/2010 1020   GFRAA  09/22/2010 1020    >60        The eGFR has been calculated using the MDRD equation. This calculation has not been validated in all clinical situations. eGFR's persistently <60 mL/min signify possible Chronic Kidney Disease.    Lipid Panel     Component Value Date/Time   CHOL 131 06/30/2017 1531   TRIG 61.0 06/30/2017 1531   HDL 45.70 06/30/2017 1531   CHOLHDL 3 06/30/2017 1531   VLDL 12.2 06/30/2017 1531   LDLCALC 73 06/30/2017 1531    CBC    Component Value Date/Time   WBC 6.6 06/30/2017 1531   RBC 4.20 06/30/2017 1531   HGB 13.0 06/30/2017 1531   HCT 38.5 06/30/2017 1531   PLT 257.0 06/30/2017 1531   MCV 91.7 06/30/2017 1531   MCH 30.2 09/03/2011 1454   MCHC 33.8 06/30/2017 1531   RDW 13.1 06/30/2017 1531   LYMPHSABS 2.9 05/17/2015 1334   MONOABS 0.3 05/17/2015 1334   EOSABS 0.1 05/17/2015 1334   BASOSABS 0.0 05/17/2015 1334    Hgb A1C Lab Results  Component Value Date   HGBA1C 4.7  06/30/2017            Assessment & Plan:   Muscle Strain of Back, s/p Fall:  Encouraged  rest, use of heat Stretching exercises given Continue Ibuprofen, Tylenol eRx for Robaxin 500 mg QHS prn- sedation caution given No indication for xrays at this time  Return precautions discussed Webb Silversmith, NP

## 2018-07-19 ENCOUNTER — Other Ambulatory Visit: Payer: Self-pay | Admitting: Internal Medicine

## 2018-08-08 ENCOUNTER — Other Ambulatory Visit: Payer: Self-pay | Admitting: Internal Medicine

## 2018-08-16 ENCOUNTER — Other Ambulatory Visit: Payer: Self-pay | Admitting: Internal Medicine

## 2018-08-16 NOTE — Telephone Encounter (Signed)
Copied from CRM [id number]. Topic: Quick Communication - See Telephone Encounter >> Aug 16, 2018 12:32 PM Windy Kalata, NT wrote: CRM for notification. See Telephone encounter for: 08/16/18.  Patient is requesting a refill on methocarbamol (ROBAXIN) 500 MG tablet. Please advise.  [name] [place] - [address] [place] [address] Phone: [phone] Fax: [phone]

## 2018-08-16 NOTE — Telephone Encounter (Signed)
Refill of robaxin  LOV 06/27/18 R. BAity  LRF 07/20/18  #10  0 refills  [name], [place] - [address] [place] [address] Phone: [phone] Fax: [phone]

## 2018-08-17 ENCOUNTER — Encounter: Payer: Self-pay | Admitting: Family Medicine

## 2018-08-17 ENCOUNTER — Ambulatory Visit (INDEPENDENT_AMBULATORY_CARE_PROVIDER_SITE_OTHER): Payer: Medicare HMO | Admitting: Family Medicine

## 2018-08-17 VITALS — BP 110/80 | HR 69 | Temp 98.2°F | Ht 68.0 in | Wt 189.5 lb

## 2018-08-17 DIAGNOSIS — H6592 Unspecified nonsuppurative otitis media, left ear: Secondary | ICD-10-CM

## 2018-08-17 DIAGNOSIS — R21 Rash and other nonspecific skin eruption: Secondary | ICD-10-CM | POA: Diagnosis not present

## 2018-08-17 MED ORDER — AMOXICILLIN 500 MG PO CAPS: 1000.0000 mg | ORAL_CAPSULE | Freq: Two times a day (BID) | ORAL | 0 refills | Status: AC

## 2018-08-17 MED ORDER — PREDNISONE 20 MG PO TABS: ORAL_TABLET | ORAL | 0 refills | Status: DC

## 2018-08-17 NOTE — Progress Notes (Signed)
Dr. Karleen Hampshire T. Tyrina Hines, MD, CAQ [facility] [address] [place], [address] Phone: [phone] Fax: [phone]  08/17/2018  Patient: Kathy Gamble, MRN: [id number], DOB: [birth year], 42 y.o.  Primary Physician:  Lorre Munroe, NP   Chief Complaint  Patient presents with  . Rash    on legs  . Ear Pain    bilateral   Subjective:   Kathy Gamble is a 42 y.o. very pleasant female patient who presents with the following:  Bumps on legs B for one week. Put some fuzzy pants on - no new soaps or fabric softeners.  She really cannot think of any other specific exposure.  She has not been outside.  She has not had any other kind of new changes to her soap or topical lotions or anything like this.  The only thing that she can think of is a new pair pants.  She has not had any significant insect exposure.  She also has pain bilaterally in her ears, and this is more on the left side.  She has not had any fever, no significant coughing, nausea, vomiting, other pulmonary symptoms.  L OM    Past Medical History, Surgical History, Social History, Family History, Problem List, Medications, and Allergies have been reviewed and updated if relevant.  Patient Active Problem List   Diagnosis Date Noted  . Gastroesophageal reflux disease without esophagitis 12/28/2014    Past Medical History:  Diagnosis Date  . Allergy   . Bee sting allergy 03/02/2015  . Chicken pox   . Frequent headaches   . GERD (gastroesophageal reflux disease)     Past Surgical History:  Procedure Laterality Date  . ADENOIDECTOMY    . TONSILLECTOMY      Social History   Socioeconomic History  . Marital status: Married    Spouse name: Not on file  . Number of children: Not on file  . Years of education: Not on file  . Highest education level: Not on file  Occupational History  . Not on file  Social Needs  . Financial resource strain: Not on file  . Food  insecurity:    Worry: Not on file    Inability: Not on file  . Transportation needs:    Medical: Not on file    Non-medical: Not on file  Tobacco Use  . Smoking status: Never Smoker  . Smokeless tobacco: Never Used  Substance and Sexual Activity  . Alcohol use: No    Alcohol/week: 0.0 standard drinks  . Drug use: Yes    Types: Cocaine  . Sexual activity: Yes    Birth control/protection: None  Lifestyle  . Physical activity:    Days per week: Not on file    Minutes per session: Not on file  . Stress: Not on file  Relationships  . Social connections:    Talks on phone: Not on file    Gets together: Not on file    Attends religious service: Not on file    Active member of club or organization: Not on file    Attends meetings of clubs or organizations: Not on file    Relationship status: Not on file  . Intimate partner violence:    Fear of current or ex partner: Not on file    Emotionally abused: Not on file    Physically abused: Not on file    Forced sexual activity: Not on file  Other Topics Concern  .  Not on file  Social History Narrative  . Not on file    Family History  Problem Relation Age of Onset  . Stroke Mother   . Mental retardation Mother   . Cancer Maternal Grandmother   . Cancer Paternal Grandmother        Breast  . Heart disease Paternal Grandfather     Allergies  Allergen Reactions  . Bee Venom Itching  . Latex Rash    The powder in the gloves causes rash    Medication list reviewed and updated in full in [facility].  ROS: GEN: Acute illness details above GI: Tolerating PO intake GU: maintaining adequate hydration and urination Pulm: No SOB Interactive and getting along well at home.  Otherwise, ROS is as per the HPI.  Objective:   BP 110/80   Pulse 69   Temp 98.2 F (36.8 C) (Oral)   Ht 5\' 8"  (1.727 m)   Wt 189 lb 8 oz (86 kg)   BMI 28.81 kg/m    GEN: WDWN, NAD, Non-toxic, A & O x 3 HEENT: Atraumatic, Normocephalic.  Neck supple. No masses, No LAD. Ears and Nose: No external deformity. L and R TM with scarring, L TM bulging and indistinct landmarks CV: RRR, No M/G/R. No JVD. No thrill. No extra heart sounds. PULM: CTA B, no wheezes, crackles, rhonchi. No retractions. No resp. distress. No accessory muscle use. EXTR: No c/c/e NEURO Normal gait.  PSYCH: Normally interactive. Conversant. Not depressed or anxious appearing.  Calm demeanor.  Scattered elevated rash on legs, evidence of pruritis     Laboratory and Imaging Data:  Assessment and Plan:   OME (otitis media with effusion), left  Rash  Etiology of rash unclear Burst of steroids likely help both + abx  Follow-up: No follow-ups on file.  Meds ordered this encounter  Medications  . amoxicillin (AMOXIL) 500 MG capsule    Sig: Take 2 capsules (1,000 mg total) by mouth 2 (two) times daily for 10 days.    Dispense:  40 capsule    Refill:  0  . predniSONE (DELTASONE) 20 MG tablet    Sig: 2 tabs po for 4 days, then 1 tab po for 3 days    Dispense:  11 tablet    Refill:  0   Signed,  Cailie Bosshart T. Max Romano, MD   Allergies as of 08/17/2018      Reactions   Bee Venom Itching   Latex Rash   The powder in the gloves causes rash      Medication List        Accurate as of 08/17/18 11:59 PM. Always use your most recent med list.          albuterol 108 (90 Base) MCG/ACT inhaler Commonly known as:  PROVENTIL HFA;VENTOLIN HFA Inhale 1-2 puffs into the lungs every 6 (six) hours as needed for wheezing or shortness of breath.   amoxicillin 500 MG capsule Commonly known as:  AMOXIL Take 2 capsules (1,000 mg total) by mouth 2 (two) times daily for 10 days.   Fish Oil 1000 MG Caps Take 1 capsule by mouth daily.   fluticasone 50 MCG/ACT nasal spray Commonly known as:  FLONASE Place 1 spray into both nostrils 2 (two) times daily.   levocetirizine 5 MG tablet Commonly known as:  XYZAL Take 1 tablet (5 mg total) by mouth every evening.     meloxicam 7.5 MG tablet Commonly known as:  MOBIC TAKE 1 TABLET (7.5 MG TOTAL)  BY MOUTH DAILY. AS NEEDED FOR KNEE PAIN. DO NOT TAKE WITH IBUPROFEN.   methocarbamol 500 MG tablet Commonly known as:  ROBAXIN TAKE 1 TABLET (500 MG TOTAL) BY MOUTH AT BEDTIME AS NEEDED FOR MUSCLE SPASMS.   montelukast 10 MG tablet Commonly known as:  SINGULAIR TAKE 1 TABLET BY MOUTH DAILY FOR SINUSES.   omeprazole 40 MG capsule Commonly known as:  PRILOSEC TAKE ONE CAPSULE BY MOUTH DAILY   predniSONE 20 MG tablet Commonly known as:  DELTASONE 2 tabs po for 4 days, then 1 tab po for 3 days   sodium chloride 0.65 % Soln nasal spray Commonly known as:  OCEAN Place 1 spray into both nostrils as needed for congestion.   SUMAtriptan 50 MG tablet Commonly known as:  IMITREX   Vitamin E 100 units Tabs Take 1 tablet by mouth daily.

## 2018-08-18 ENCOUNTER — Encounter: Payer: Self-pay | Admitting: Family Medicine

## 2018-08-23 ENCOUNTER — Other Ambulatory Visit: Payer: Self-pay | Admitting: Internal Medicine

## 2018-08-26 ENCOUNTER — Encounter: Payer: Self-pay | Admitting: Family Medicine

## 2018-08-26 ENCOUNTER — Ambulatory Visit (INDEPENDENT_AMBULATORY_CARE_PROVIDER_SITE_OTHER): Payer: Medicare HMO | Admitting: Family Medicine

## 2018-08-26 VITALS — BP 122/84 | HR 89 | Temp 98.5°F | Ht 68.0 in | Wt 186.0 lb

## 2018-08-26 DIAGNOSIS — M79662 Pain in left lower leg: Secondary | ICD-10-CM | POA: Insufficient documentation

## 2018-08-26 DIAGNOSIS — M79671 Pain in right foot: Secondary | ICD-10-CM | POA: Insufficient documentation

## 2018-08-26 DIAGNOSIS — I83893 Varicose veins of bilateral lower extremities with other complications: Secondary | ICD-10-CM | POA: Diagnosis not present

## 2018-08-26 DIAGNOSIS — M79604 Pain in right leg: Secondary | ICD-10-CM | POA: Insufficient documentation

## 2018-08-26 NOTE — Assessment & Plan Note (Signed)
Treat with elevation, weight loss ,exercsie and compression hose.

## 2018-08-26 NOTE — Assessment & Plan Note (Signed)
Due to contusion. No indication for x-ray.

## 2018-08-26 NOTE — Patient Instructions (Signed)
Elevated left leg. Ice  Daily.  Can use ibuprofen 600 mg to 800 mg every 8 hours for pain.  Can wear compression hose for vein issues.

## 2018-08-26 NOTE — Progress Notes (Signed)
   Subjective:    Patient ID: Kathy Gamble, female    DOB: Dec 12, 1976, 42 y.o.   MRN: [id number]  Foot Pain  Pertinent negatives include no abdominal pain, chest pain, fatigue or fever.     42 year old female pt of [place] with decreased intellectual ability presents with left shin pain in last week.  She dropped a heavy cup on left anterior ankle in dark with no contacts in. Was not sure what had happened.  She noted some swelling and redness.  Area is sore. Pain with weight bearing. 7/10    She has been applying heat and ice.  Using tylenol and ibuprofen.  Wearing brace off and on, lace up.   She is being treated for OM for amoxicillin and prednsione.. Symptoms have improved.  Blood pressure 122/84, pulse 89, temperature 98.5 F (36.9 C), temperature source Oral, height 5\' 8"  (1.727 m), weight 186 lb (84.4 kg), SpO2 99 %.   Review of Systems  Constitutional: Negative for fatigue and fever.  HENT: Negative for ear pain.   Eyes: Negative for pain.  Respiratory: Negative for chest tightness and shortness of breath.   Cardiovascular: Negative for chest pain, palpitations and leg swelling.  Gastrointestinal: Negative for abdominal pain.  Genitourinary: Negative for dysuria.       Objective:   Physical Exam  Constitutional: Vital signs are normal. She appears well-developed and well-nourished. She is cooperative.  Non-toxic appearance. She does not appear ill. No distress.  HENT:  Head: Normocephalic.  Right Ear: Hearing, tympanic membrane, external ear and ear canal normal. Tympanic membrane is not erythematous, not retracted and not bulging.  Left Ear: Hearing, tympanic membrane, external ear and ear canal normal. Tympanic membrane is not erythematous, not retracted and not bulging.  Nose: No mucosal edema or rhinorrhea. Right sinus exhibits no maxillary sinus tenderness and no frontal sinus tenderness. Left sinus exhibits no maxillary sinus tenderness and no frontal sinus  tenderness.  Mouth/Throat: Uvula is midline, oropharynx is clear and moist and mucous membranes are normal.  Eyes: Pupils are equal, round, and reactive to light. Conjunctivae, EOM and lids are normal. Lids are everted and swept, no foreign bodies found.  Neck: Trachea normal and normal range of motion. Neck supple. Carotid bruit is not present. No thyroid mass and no thyromegaly present.  Cardiovascular: Normal rate, regular rhythm, S1 normal, S2 normal, normal heart sounds, intact distal pulses and normal pulses. Exam reveals no gallop and no friction rub.  No murmur heard. Pulmonary/Chest: Effort normal and breath sounds normal. No tachypnea. No respiratory distress. She has no decreased breath sounds. She has no wheezes. She has no rhonchi. She has no rales.  Abdominal: Soft. Normal appearance and bowel sounds are normal. There is no tenderness.  Musculoskeletal:       Left ankle: She exhibits normal range of motion and no swelling. No tenderness. No lateral malleolus and no medial malleolus tenderness found. Achilles tendon exhibits no pain.  Left anterior shin with  Small contusion and area of broken spider veins.  Neurological: She is alert.  Skin: Skin is warm, dry and intact. No rash noted.  Bilateral venous insufficiency and varicose veins.    Psychiatric: Her speech is normal and behavior is normal. Judgment and thought content normal. Her mood appears not anxious. Cognition and memory are normal. She does not exhibit a depressed mood.          Assessment & Plan:

## 2018-09-01 ENCOUNTER — Other Ambulatory Visit: Payer: Self-pay | Admitting: Internal Medicine

## 2018-09-02 ENCOUNTER — Other Ambulatory Visit: Payer: Self-pay | Admitting: Family Medicine

## 2018-09-02 DIAGNOSIS — J302 Other seasonal allergic rhinitis: Secondary | ICD-10-CM

## 2018-09-20 ENCOUNTER — Other Ambulatory Visit: Payer: Self-pay | Admitting: Internal Medicine

## 2018-09-20 ENCOUNTER — Ambulatory Visit (INDEPENDENT_AMBULATORY_CARE_PROVIDER_SITE_OTHER): Payer: Medicare HMO | Admitting: Internal Medicine

## 2018-09-20 ENCOUNTER — Encounter: Payer: Self-pay | Admitting: Internal Medicine

## 2018-09-20 DIAGNOSIS — Z23 Encounter for immunization: Secondary | ICD-10-CM | POA: Diagnosis not present

## 2018-09-20 DIAGNOSIS — J301 Allergic rhinitis due to pollen: Secondary | ICD-10-CM | POA: Insufficient documentation

## 2018-09-20 DIAGNOSIS — J302 Other seasonal allergic rhinitis: Secondary | ICD-10-CM | POA: Insufficient documentation

## 2018-09-20 MED ORDER — ALBUTEROL SULFATE HFA 108 (90 BASE) MCG/ACT IN AERS: INHALATION_SPRAY | RESPIRATORY_TRACT | 0 refills | Status: DC

## 2018-09-20 MED ORDER — MONTELUKAST SODIUM 10 MG PO TABS
ORAL_TABLET | ORAL | 0 refills | Status: DC
Start: 2018-09-20 — End: 2019-08-09

## 2018-09-20 NOTE — Patient Instructions (Signed)

## 2018-09-20 NOTE — Progress Notes (Signed)
HPI  Pt presents to the clinic today with c/o nasal congestion, post nasal drip and cough. She reports this started 1-2 weeks ago. She is blowing green mucous out of her nose, mostly at night. She denies sore throat. The cough is non productive. She denies fever, chills or body aches. She has tried Advair, Flonase, [employer], and Xyzal as prescribed. She is not taking her Singulair. She reports she can not afford her Albuterol. She has a history of allergies. She has not had sick contacts. She was treated for an otitis media 1 month with Amoxil and Prednisone.   Review of Systems     Past Medical History:  Diagnosis Date  . Allergy   . Bee sting allergy 03/02/2015  . Chicken pox   . Frequent headaches   . GERD (gastroesophageal reflux disease)     Family History  Problem Relation Age of Onset  . Stroke Mother   . Mental retardation Mother   . Cancer Maternal Grandmother   . Cancer Paternal Grandmother        Breast  . Heart disease Paternal Grandfather     Social History   Socioeconomic History  . Marital status: Married    Spouse name: Not on file  . Number of children: Not on file  . Years of education: Not on file  . Highest education level: Not on file  Occupational History  . Not on file  Social Needs  . Financial resource strain: Not on file  . Food insecurity:    Worry: Not on file    Inability: Not on file  . Transportation needs:    Medical: Not on file    Non-medical: Not on file  Tobacco Use  . Smoking status: Never Smoker  . Smokeless tobacco: Never Used  Substance and Sexual Activity  . Alcohol use: No    Alcohol/week: 0.0 standard drinks  . Drug use: Yes    Types: Cocaine  . Sexual activity: Yes    Birth control/protection: None  Lifestyle  . Physical activity:    Days per week: Not on file    Minutes per session: Not on file  . Stress: Not on file  Relationships  . Social connections:    Talks on phone: Not on file    Gets together:  Not on file    Attends religious service: Not on file    Active member of club or organization: Not on file    Attends meetings of clubs or organizations: Not on file    Relationship status: Not on file  . Intimate partner violence:    Fear of current or ex partner: Not on file    Emotionally abused: Not on file    Physically abused: Not on file    Forced sexual activity: Not on file  Other Topics Concern  . Not on file  Social History Narrative  . Not on file    Allergies  Allergen Reactions  . Bee Venom Itching  . Latex Rash    The powder in the gloves causes rash     Constitutional: Denies headache, fatigue, fever or  abrupt weight changes.  HEENT:  Positive nasal congestion and post nasal drip. Denies eye redness, ear pain, ringing in the ears, wax buildup, runny nose or sore throat. Respiratory: Positive cough. Denies difficulty breathing or shortness of breath.  Cardiovascular: Denies chest pain, chest tightness, palpitations or swelling in the hands or feet.   No other specific complaints in  a complete review of systems (except as listed in HPI above).  Objective:   BP 122/78   Pulse 63   Temp 98 F (36.7 C) (Oral)   Wt 187 lb (84.8 kg)   LMP  (LMP Unknown)   SpO2 100%   BMI 28.43 kg/m   General: Appears her stated age, well developed, well nourished in NAD. HEENT: Head: normal shape and size, mild maxillary sinus tenderness noted; Nose: mucosa boggy and moist, septum midline, turbinates not swollen; Throat/Mouth: + PND. Teeth present, mucosa pink and moist, no exudate noted, no lesions or ulcerations noted.  Neck:  No adenopathy noted.  Cardiovascular: Normal rate and rhythm. S1,S2 noted.  No murmur, rubs or gallops noted.  Pulmonary/Chest: Normal effort and positive vesicular breath sounds. No respiratory distress. No wheezes, rales or ronchi noted.       Assessment & Plan:   Allergic Rhinitis  Can use a Neti Pot which can be purchased from your local  drug store. Continue Flonase, [place], Xyzal Advised her that she needs to start the Singulair- eRx sent to pharmacy No indication for abx or steriods at this time, discussed overuse of abx Advised her to follow up with her ENT as needed Flu shot today  RTC as needed or if symptoms persist. Nicki Reaper, NP

## 2018-09-21 ENCOUNTER — Telehealth: Payer: Self-pay

## 2018-09-21 NOTE — Telephone Encounter (Signed)
Pt's mobile not accepting calls... Called pt's husband no answer lmovm... Pt needs to cancel lab only appt and schedule an AWV

## 2018-09-21 NOTE — Telephone Encounter (Signed)
-----   Message from Lorre Munroe, NP sent at 09/20/2018 10:46 AM EDT ----- Regarding: RE: Lab orders for Wednesday, 10.10.19 Cancel lab appt, I told her to make an appt for her physcial. Mel- Can we make sure she schedules her physical. ----- Message ----- From: Alvina Chou Sent: 09/20/2018  10:05 AM EDT To: Lorre Munroe, NP Subject: Lab orders for Wednesday, 10.10.19             Lab appt for tomorrow, no f/u appt. Need orders, thanks

## 2018-09-23 ENCOUNTER — Other Ambulatory Visit: Payer: Medicare HMO

## 2018-09-26 ENCOUNTER — Ambulatory Visit: Payer: Self-pay | Admitting: *Deleted

## 2018-09-26 NOTE — Telephone Encounter (Signed)
Flu shot last week- warm, tender, stinging, slightly red.  Patient states she has not taken any Benadryl and wants to know if that would be ok- per patient she is not taking Singular now- Micro medex drug interaction- ok to take Benadryl and Xyzal . Per patient she does not have a lot of redness- she does have bruise at  injection site with itching. It is hard to understand patient- but she states there is no redness or swelling. She has been taking good care of her arm and patient praised for her good care of area. She is to call back with any concerns. Reason for Disposition . Influenza (TIV; Injection) injected vaccine reactions  Answer Assessment - Initial Assessment Questions 1. SYMPTOMS: "What is the main symptom?" (e.g., redness, swelling, pain)      Warm, tender, itching 2. ONSET: "When was the vaccine (shot) given?" "How much later did the last tenderness, irritation begin?" (e.g., hours, days ago)      Friday, next day 3. SEVERITY: "How bad is it?"      Gotten worse- there is a bruise- size of nickel and long finger length  4. FEVER: "Is there a fever?" If so, ask: "What is it, how was it measured, and when did it start?"      no 5. IMMUNIZATIONS GIVEN: "What shots have you recently received?"     Only shot patient got that day 6. PAST REACTIONS: "Have you reacted to immunizations before?" If so, ask: "What happened?"     Yes-  Patient states she does not remember 7. OTHER SYMPTOMS: "Do you have any other symptoms?"     Patient has taken Tylenol and Ibuprofen.  Protocols used: IMMUNIZATION REACTIONS-A-AH

## 2018-09-26 NOTE — Telephone Encounter (Signed)
This seems appropriate care. Okay to proceed, can add ice to site. If needed.

## 2018-09-26 NOTE — Telephone Encounter (Signed)
Nicki Reaper NP out of office 09/26/18 and 09/27/18.Please advise.

## 2018-09-27 ENCOUNTER — Ambulatory Visit: Payer: Medicare HMO

## 2018-10-13 ENCOUNTER — Other Ambulatory Visit: Payer: Self-pay | Admitting: Family Medicine

## 2018-10-13 DIAGNOSIS — J302 Other seasonal allergic rhinitis: Secondary | ICD-10-CM

## 2018-10-14 NOTE — Telephone Encounter (Signed)
Only as needed

## 2018-10-14 NOTE — Telephone Encounter (Signed)
Is this something pt needs to continue on a daily basis, last filled 09/20/18.... Please advise

## 2018-10-21 ENCOUNTER — Telehealth: Payer: Self-pay

## 2018-10-21 NOTE — Telephone Encounter (Signed)
Kathy Gamble (DPR signed) called to ck on status of paperwork for referral. Mellody Dance request cb.

## 2018-10-21 NOTE — Telephone Encounter (Signed)
I have called the [facility] Left message on voicemail requesting morning formation on what they need from [place].... The form that was given to me says "Disclosure of Protected Health Information"  Request that the needed letter be faxed ATTN Intake to [phone]

## 2018-10-24 ENCOUNTER — Encounter: Payer: Self-pay | Admitting: Internal Medicine

## 2018-10-24 ENCOUNTER — Ambulatory Visit (INDEPENDENT_AMBULATORY_CARE_PROVIDER_SITE_OTHER): Payer: Medicare HMO | Admitting: Internal Medicine

## 2018-10-24 VITALS — BP 124/82 | HR 94 | Temp 98.2°F | Wt 183.0 lb

## 2018-10-24 DIAGNOSIS — F39 Unspecified mood [affective] disorder: Secondary | ICD-10-CM | POA: Diagnosis not present

## 2018-10-24 DIAGNOSIS — F331 Major depressive disorder, recurrent, moderate: Secondary | ICD-10-CM | POA: Diagnosis not present

## 2018-10-24 NOTE — Patient Instructions (Signed)
Depression Screening Depression screening is a tool that your health care provider can use to learn if you have symptoms of depression. Depression is a common condition with many symptoms that are also often found in other conditions. Depression is treatable, but it must first be diagnosed. You may not know that certain feelings, thoughts, and behaviors that you are having can be symptoms of depression. Taking a depression screening test can help you and your health care provider decide if you need more assessment, or if you should be referred to a mental health care provider. What are the screening tests?  You may have a physical exam to see if another condition is affecting your mental health. You may have a blood or urine sample taken during the physical exam.  You may be interviewed using a screening tool that was developed from research, such as one of these: ? Patient Health Questionnaire (PHQ). This is a set of either 2 or 9 questions. A health care provider who has been trained to score this screening test uses a guide to assess if your symptoms suggest that you may have depression. ? Hamilton Depression Rating Scale (HAM-D). This is a set of either 17 or 24 questions. You may be asked to take it again during or after your treatment, to see if your depression has gotten better. ? Beck Depression Inventory (BDI). This is a set of 21 multiple choice questions. Your health care provider scores your answers to assess:  Your level of depression, ranging from mild to severe.  Your response to treatment.  Your health care provider may talk with you about your daily activities, such as eating, sleeping, work, and recreation, and ask if you have had any changes in activity.  Your health care provider may ask you to see a mental health specialist, such as a psychiatrist or psychologist, for more evaluation. Who should be screened for depression?  All adults, including adults with a family history  of a mental health disorder.  Adolescents who are 12-18 years old.  People who are recovering from a myocardial infarction (MI).  Pregnant women, or women who have given birth.  People who have a long-term (chronic) illness.  Anyone who has been diagnosed with another type of a mental health disorder.  Anyone who has symptoms that could show depression. What do my results mean? Your health care provider will review the results of your depression screening, physical exam, and lab tests. Positive screens suggest that you may have depression. Screening is the first step in getting the care that you may need. It is up to you to get your screening results. Ask your health care provider, or the department that is doing your screening tests, when your results will be ready. Talk with your health care provider about your results and diagnosis. A diagnosis of depression is made using the Diagnostic and Statistical Manual of Mental Disorders (DSM-V). This is a book that lists the number and type of symptoms that must be present for a health care provider to give a specific diagnosis.  Your health care provider may work with you to treat your symptoms of depression, or your health care provider may help you find a mental health provider who can assess, diagnose, and treat your depression. Get help right away if:  You have thoughts about hurting yourself or others. If you ever feel like you may hurt yourself or others, or have thoughts about taking your own life, get help right away. You can   go to your nearest emergency department or call:  Your local emergency services (911 in the [place]  A suicide crisis helpline, such as the National Suicide Prevention Lifeline at 1-800-273-8255. This is open 24 hours a day.  Summary  Depression screening is the first step in getting the help that you may need.  If your screening test shows symptoms of depression (is positive), your health care provider may ask  you to see a mental health provider.  Anyone who is age 12 or older should be screened for depression. This information is not intended to replace advice given to you by your health care provider. Make sure you discuss any questions you have with your health care provider. Document Released: 04/16/2017 Document Revised: 04/16/2017 Document Reviewed: 04/16/2017 Elsevier Interactive Patient Education  2018 [employer]  
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
## 2018-10-25 ENCOUNTER — Other Ambulatory Visit: Payer: Self-pay | Admitting: Internal Medicine

## 2018-10-25 DIAGNOSIS — M25561 Pain in right knee: Principal | ICD-10-CM

## 2018-10-25 DIAGNOSIS — M25562 Pain in left knee: Principal | ICD-10-CM

## 2018-10-25 DIAGNOSIS — G8929 Other chronic pain: Secondary | ICD-10-CM

## 2018-10-26 NOTE — Telephone Encounter (Signed)
Do not continue

## 2018-10-26 NOTE — Telephone Encounter (Signed)
Please advise if she is to continue medication

## 2018-10-28 NOTE — Telephone Encounter (Signed)
I spoke to [place] with [facility] and she stated that they did not need anything from the PCP but the form that was given is a release of PHI and that is all and she states she will call pt and her father to explain to them

## 2018-11-01 ENCOUNTER — Telehealth: Payer: Self-pay | Admitting: *Deleted

## 2018-11-01 ENCOUNTER — Ambulatory Visit (INDEPENDENT_AMBULATORY_CARE_PROVIDER_SITE_OTHER): Payer: Medicare HMO | Admitting: Family Medicine

## 2018-11-01 ENCOUNTER — Ambulatory Visit: Payer: Medicare HMO | Admitting: Internal Medicine

## 2018-11-01 ENCOUNTER — Telehealth: Payer: Self-pay | Admitting: Internal Medicine

## 2018-11-01 ENCOUNTER — Encounter: Payer: Self-pay | Admitting: Family Medicine

## 2018-11-01 VITALS — BP 100/78 | HR 80 | Temp 98.5°F | Ht 68.0 in | Wt 182.8 lb

## 2018-11-01 DIAGNOSIS — J069 Acute upper respiratory infection, unspecified: Secondary | ICD-10-CM | POA: Insufficient documentation

## 2018-11-01 DIAGNOSIS — H6980 Other specified disorders of Eustachian tube, unspecified ear: Secondary | ICD-10-CM | POA: Diagnosis not present

## 2018-11-01 DIAGNOSIS — B9789 Other viral agents as the cause of diseases classified elsewhere: Secondary | ICD-10-CM | POA: Diagnosis not present

## 2018-11-01 MED ORDER — GUAIFENESIN-CODEINE 100-10 MG/5ML PO SYRP: 5.0000 mL | ORAL_SOLUTION | Freq: Every evening | ORAL | 0 refills | Status: DC | PRN

## 2018-11-01 NOTE — Telephone Encounter (Signed)
Opened in error

## 2018-11-01 NOTE — Progress Notes (Signed)
   Subjective:    Patient ID: Kathy Gamble, female    DOB: 07/29/1976, 42 y.o.   MRN: [id number]  Cough  This is a new problem. The current episode started 1 to 4 weeks ago. The problem has been gradually worsening. The cough is productive of sputum. Associated symptoms include ear pain, nasal congestion and a sore throat. Pertinent negatives include no chills, fever, shortness of breath or wheezing. Associated symptoms comments:  Both ears. The symptoms are aggravated by lying down. Risk factors: nonsmoker. She has tried OTC cough suppressant for the symptoms. The treatment provided mild relief. Her past medical history is significant for environmental allergies. There is no history of asthma or COPD.  Otalgia   Associated symptoms include coughing and a sore throat.    Using Singulair, Xyzal and flonase.  Blood pressure 100/78, pulse 80, temperature 98.5 F (36.9 C), temperature source Oral, height 5\' 8"  (1.727 m), weight 182 lb 12 oz (82.9 kg), SpO2 98 %.    Review of Systems  Constitutional: Negative for chills and fever.  HENT: Positive for ear pain and sore throat.   Respiratory: Positive for cough. Negative for shortness of breath and wheezing.   Allergic/Immunologic: Positive for environmental allergies.       Objective:   Physical Exam  Constitutional: Vital signs are normal. She appears well-developed and well-nourished. She is cooperative.  Non-toxic appearance. She does not appear ill. No distress.  HENT:  Head: Normocephalic.  Right Ear: Hearing, external ear and ear canal normal. Tympanic membrane is scarred. Tympanic membrane is not erythematous, not retracted and not bulging. A middle ear effusion is present.  Left Ear: Hearing, external ear and ear canal normal. Tympanic membrane is scarred. Tympanic membrane is not erythematous, not retracted and not bulging. A middle ear effusion is present.  Nose: Mucosal edema and rhinorrhea present. Right sinus exhibits no  maxillary sinus tenderness and no frontal sinus tenderness. Left sinus exhibits no maxillary sinus tenderness and no frontal sinus tenderness.  Mouth/Throat: Uvula is midline, oropharynx is clear and moist and mucous membranes are normal.  Eyes: Pupils are equal, round, and reactive to light. Conjunctivae, EOM and lids are normal. Lids are everted and swept, no foreign bodies found.  Neck: Trachea normal and normal range of motion. Neck supple. Carotid bruit is not present. No thyroid mass and no thyromegaly present.  Cardiovascular: Normal rate, regular rhythm, S1 normal, S2 normal, normal heart sounds, intact distal pulses and normal pulses. Exam reveals no gallop and no friction rub.  No murmur heard. Pulmonary/Chest: Effort normal and breath sounds normal. No tachypnea. No respiratory distress. She has no decreased breath sounds. She has no wheezes. She has no rhonchi. She has no rales.  Neurological: She is alert.  Skin: Skin is warm, dry and intact. No rash noted.  Psychiatric: Her speech is normal and behavior is normal. Judgment normal. Her mood appears not anxious. Cognition and memory are normal. She does not exhibit a depressed mood.          Assessment & Plan:

## 2018-11-01 NOTE — Assessment & Plan Note (Signed)
Rest, fluids, symptomatic care. 

## 2018-11-01 NOTE — Telephone Encounter (Signed)
Mark, pts husband, left vm at triage indicating guaifenesin-codeine Rx is not covered by insurance, and is going to cost $40. Pt is requesting a new Rx for a med on his formulary and is wanting a call back when completed.

## 2018-11-01 NOTE — Assessment & Plan Note (Signed)
Increase flonase to 2 sprays per nostril, nasal saline.

## 2018-11-01 NOTE — Telephone Encounter (Signed)
Left detailed message on voicemail of call back number [phone]

## 2018-11-01 NOTE — Telephone Encounter (Signed)
Call: That is generic.. I am unaware of any cheaper... Can take OTC mucinex DM BID instead.

## 2018-11-01 NOTE — Patient Instructions (Addendum)
Mucinex DM during the day for cough.  At night you can use prescription cough suppressant.  Increase  flonase to 2 sprays per nostril daily.   Start nasal saline spray  2-3 times a day.  Call if not improiving as expected.. In next week.

## 2018-11-02 ENCOUNTER — Ambulatory Visit: Payer: Medicare HMO | Admitting: Internal Medicine

## 2018-11-14 DIAGNOSIS — F419 Anxiety disorder, unspecified: Secondary | ICD-10-CM | POA: Diagnosis not present

## 2018-11-28 DIAGNOSIS — F419 Anxiety disorder, unspecified: Secondary | ICD-10-CM | POA: Diagnosis not present

## 2018-11-29 ENCOUNTER — Encounter: Payer: Self-pay | Admitting: Nurse Practitioner

## 2018-11-29 ENCOUNTER — Ambulatory Visit (INDEPENDENT_AMBULATORY_CARE_PROVIDER_SITE_OTHER): Payer: Medicare HMO | Admitting: Nurse Practitioner

## 2018-11-29 ENCOUNTER — Encounter: Payer: Medicare HMO | Admitting: Internal Medicine

## 2018-11-29 ENCOUNTER — Other Ambulatory Visit (HOSPITAL_COMMUNITY)
Admission: RE | Admit: 2018-11-29 | Discharge: 2018-11-29 | Disposition: A | Discharge status: Home / Self Care | Payer: Medicare HMO | Source: Ambulatory Visit | Attending: Nurse Practitioner | Admitting: Nurse Practitioner

## 2018-11-29 VITALS — BP 126/88 | HR 82 | Temp 97.8°F | Ht 68.0 in | Wt 178.8 lb

## 2018-11-29 DIAGNOSIS — R300 Dysuria: Secondary | ICD-10-CM | POA: Diagnosis not present

## 2018-11-29 DIAGNOSIS — N898 Other specified noninflammatory disorders of vagina: Secondary | ICD-10-CM | POA: Insufficient documentation

## 2018-11-29 DIAGNOSIS — J329 Chronic sinusitis, unspecified: Secondary | ICD-10-CM

## 2018-11-29 DIAGNOSIS — H6983 Other specified disorders of Eustachian tube, bilateral: Secondary | ICD-10-CM

## 2018-11-29 LAB — POCT URINALYSIS DIPSTICK
GLUCOSE UA: NEGATIVE
LEUKOCYTES UA: NEGATIVE
Nitrite, UA: NEGATIVE
Protein, UA: POSITIVE — AB
RBC UA: NEGATIVE
Urobilinogen, UA: 0.2 E.U./dL
pH, UA: 5.5 (ref 5.0–8.0)

## 2018-11-29 MED ORDER — AZITHROMYCIN 250 MG PO TABS: 250.0000 mg | ORAL_TABLET | Freq: Every day | ORAL | 0 refills | Status: DC

## 2018-11-29 NOTE — Patient Instructions (Addendum)
Maintain adequate oral hydration.  Use cool mist humidifer at bedtime.  Use saline rinse rinse once a day.  Azithromycin sent for sinus infection.  You will be contacted with vaginal swab results.  You need to make appt with pcp for annual physical.

## 2018-11-29 NOTE — Progress Notes (Signed)
Subjective:  Patient ID: Kathy Gamble, female    DOB: 1975/12/21  Age: 42 y.o. MRN: [id number]  CC: Ear Pain (pt is complaining of ears pain,congestion,coughing yellow mucus/1 wk/triend mucinex/ hx of ear infection) and Urinary Tract Infection (pt is complaining of burning sensation/frequent urinate/ get alot of uti. )  Vaginal Discharge  The patient's primary symptoms include genital itching and vaginal discharge. The patient's pertinent negatives include no genital lesions, genital odor, genital rash, missed menses, pelvic pain or vaginal bleeding. This is a new problem. The current episode started in the past 7 days. The problem occurs constantly. The problem has been unchanged. She is not pregnant. Pertinent negatives include no chills, headaches, nausea, painful intercourse or sore throat. The vaginal discharge was yellow. The vaginal bleeding is typical of menses. Nothing aggravates the symptoms. She is not sexually active. She uses abstinence for contraception. Her menstrual history has been regular.  Sinusitis  This is a recurrent problem. The current episode started 1 to 4 weeks ago. The problem has been waxing and waning since onset. There has been no fever. Associated symptoms include congestion, coughing, ear pain and sinus pressure. Pertinent negatives include no chills, headaches, shortness of breath or sore throat. Past treatments include saline sprays.   Reviewed past Medical, Social and Family history today.  Outpatient Medications Prior to Visit  Medication Sig Dispense Refill  . albuterol (VENTOLIN HFA) 108 (90 Base) MCG/ACT inhaler INHALE ONE OR TWO PUFFS INTO THE LUNGS EVERY 6 HOURS AS NEEDED FOR WHEEZING OR SHORTNESS OF BREATH. 18 Inhaler 0  . fluticasone (FLONASE) 50 MCG/ACT nasal spray Place 1 spray into both nostrils 2 (two) times daily. 16 g 0  . levocetirizine (XYZAL) 5 MG tablet TAKE 1 TABLET BY MOUTH EVERY DAY IN THE EVENING 90 tablet 1  . montelukast (SINGULAIR) 10  MG tablet TAKE 1 TABLET BY MOUTH DAILY FOR SINUSES. 90 tablet 0  . Multiple Vitamin (MULTIVITAMIN) capsule Take 1 capsule by mouth daily.    . Omega-3 Fatty Acids (FISH OIL) 1000 MG CAPS Take 1 capsule by mouth daily.    . sodium chloride (OCEAN) 0.65 % SOLN nasal spray Place 1 spray into both nostrils as needed for congestion.    . Vitamin D, Cholecalciferol, 1000 units CAPS Take by mouth.    Marland Kitchen guaiFENesin-codeine (ROBITUSSIN AC) 100-10 MG/5ML syrup Take 5-10 mLs by mouth at bedtime as needed for cough. (Patient not taking: Reported on 11/29/2018) 180 mL 0   No facility-administered medications prior to visit.     ROS See HPI  Objective:  BP 126/88   Pulse 82 Comment: 82  Temp 97.8 F (36.6 C) (Oral)   Ht 5\' 8"  (1.727 m)   Wt 178 lb 12.8 oz (81.1 kg)   LMP 11/07/2018 (Within Weeks)   SpO2 97%   BMI 27.19 kg/m   BP Readings from Last 3 Encounters:  11/29/18 126/88  11/01/18 100/78  10/24/18 124/82    Wt Readings from Last 3 Encounters:  11/29/18 178 lb 12.8 oz (81.1 kg)  11/01/18 182 lb 12 oz (82.9 kg)  10/24/18 183 lb (83 kg)    Physical Exam Exam conducted with a chaperone present.  HENT:     Head:     Jaw: No trismus or tenderness.     Right Ear: No drainage. A middle ear effusion is present. There is no impacted cerumen. Tympanic membrane is scarred. Tympanic membrane is not injected, erythematous or bulging.     Left Ear:  No drainage. A middle ear effusion is present. There is no impacted cerumen. Tympanic membrane is scarred. Tympanic membrane is not injected, erythematous or bulging.     Nose:     Right Sinus: Maxillary sinus tenderness present. No frontal sinus tenderness.     Left Sinus: Maxillary sinus tenderness present. No frontal sinus tenderness.     Mouth/Throat:     Tonsils: Swelling: 1+ on the right. 1+ on the left.  Neck:     Musculoskeletal: Normal range of motion and neck supple.  Cardiovascular:     Rate and Rhythm: Normal rate and regular  rhythm.  Pulmonary:     Effort: Pulmonary effort is normal.     Breath sounds: Normal breath sounds.  Genitourinary:    Exam position: Supine.     Labia:        Right: No rash or tenderness.        Left: No rash or tenderness.      Vagina: Vaginal discharge and erythema present.  Lymphadenopathy:     Cervical: No cervical adenopathy.     Lab Results  Component Value Date   WBC 6.6 06/30/2017   HGB 13.0 06/30/2017   HCT 38.5 06/30/2017   PLT 257.0 06/30/2017   GLUCOSE 82 06/30/2017   CHOL 131 06/30/2017   TRIG 61.0 06/30/2017   HDL 45.70 06/30/2017   LDLCALC 73 06/30/2017   ALT 12 06/30/2017   AST 13 06/30/2017   NA 139 06/30/2017   K 3.7 06/30/2017   CL 108 06/30/2017   CREATININE 0.75 06/30/2017   BUN 23 06/30/2017   CO2 25 06/30/2017   TSH 1.40 09/10/2015   HGBA1C 4.7 06/30/2017    Assessment & Plan:   Kathy Gamble was seen today for ear pain and urinary tract infection.  Diagnoses and all orders for this visit:  Recurrent sinusitis -     azithromycin (ZITHROMAX Z-PAK) 250 MG tablet; Take 1 tablet (250 mg total) by mouth daily. Take 2tabs on first day, then 1tab once a day till complete  Dysuria -     POCT urinalysis dipstick  Vaginal discharge -     Cervicovaginal ancillary only( [place])  Eustachian tube dysfunction, bilateral   I have discontinued Kathy Gamble's guaiFENesin-codeine. I am also having her start on azithromycin. Additionally, I am having her maintain her fluticasone, sodium chloride, Fish Oil, levocetirizine, montelukast, albuterol, Vitamin D (Cholecalciferol), and multivitamin.  Meds ordered this encounter  Medications  . azithromycin (ZITHROMAX Z-PAK) 250 MG tablet    Sig: Take 1 tablet (250 mg total) by mouth daily. Take 2tabs on first day, then 1tab once a day till complete    Dispense:  6 tablet    Refill:  0    Order Specific Question:   Supervising Provider    Answer:   Dianne Dun [[id number]]    Problem List Items Addressed This  Visit    None    Visit Diagnoses    Recurrent sinusitis    -  Primary   Relevant Medications   azithromycin (ZITHROMAX Z-PAK) 250 MG tablet   Dysuria       Relevant Orders   POCT urinalysis dipstick (Completed)   Vaginal discharge       Relevant Orders   Cervicovaginal ancillary only( [place])   Eustachian tube dysfunction, bilateral           Follow-up: No follow-ups on file.  Alysia Penna, NP

## 2018-12-01 ENCOUNTER — Encounter: Payer: Self-pay | Admitting: Nurse Practitioner

## 2018-12-01 LAB — CERVICOVAGINAL ANCILLARY ONLY
Bacterial vaginitis: NEGATIVE
Candida vaginitis: NEGATIVE

## 2018-12-20 ENCOUNTER — Ambulatory Visit (INDEPENDENT_AMBULATORY_CARE_PROVIDER_SITE_OTHER): Payer: Medicare HMO | Admitting: Family Medicine

## 2018-12-20 ENCOUNTER — Encounter: Payer: Self-pay | Admitting: Family Medicine

## 2018-12-20 VITALS — BP 110/84 | HR 82 | Temp 98.3°F | Ht 68.0 in | Wt 183.5 lb

## 2018-12-20 DIAGNOSIS — J0141 Acute recurrent pansinusitis: Secondary | ICD-10-CM

## 2018-12-20 MED ORDER — AMOXICILLIN-POT CLAVULANATE 875-125 MG PO TABS: 1.0000 | ORAL_TABLET | Freq: Two times a day (BID) | ORAL | 0 refills | Status: AC

## 2018-12-20 NOTE — Progress Notes (Signed)
Subjective:     Kathy Gamble is a 43 y.o. female presenting for Cough (symptoms present x 2 weeks. Ear pain, cough-productive with clear phlegm now but was yellowish, runny nose, clear post nasal drainage, scratchy throat, headaches. No fever.)     Cough  This is a new problem. The current episode started 1 to 4 weeks ago. The problem has been unchanged. The cough is productive of sputum. Associated symptoms include ear pain, headaches, myalgias, nasal congestion, postnasal drip, rhinorrhea, a sore throat, shortness of breath and wheezing. Pertinent negatives include no chills, ear congestion, fever or hemoptysis. The symptoms are aggravated by cold air. She has tried a beta-agonist inhaler (tylenol, exedrine, iburpofen) for the symptoms. The treatment provided mild relief.   Ears are sensitive to sounds HA worse in the morning and before bed  Treated for sinus infection in December which returned as soon as she finished abx  Review of Systems  Constitutional: Negative for chills and fever.  HENT: Positive for ear pain, postnasal drip, rhinorrhea and sore throat.   Respiratory: Positive for cough, shortness of breath and wheezing. Negative for hemoptysis.   Musculoskeletal: Positive for myalgias.  Neurological: Positive for headaches.   11/29/2018: Clinic - azithromycin for sinusitis. UA clear  Social History   Tobacco Use  Smoking Status Never Smoker  Smokeless Tobacco Never Used        Objective:    BP Readings from Last 3 Encounters:  12/20/18 110/84  11/29/18 126/88  11/01/18 100/78   Wt Readings from Last 3 Encounters:  12/20/18 183 lb 8 oz (83.2 kg)  11/29/18 178 lb 12.8 oz (81.1 kg)  11/01/18 182 lb 12 oz (82.9 kg)    BP 110/84   Pulse 82   Temp 98.3 F (36.8 C)   Ht 5\' 8"  (1.727 m)   Wt 183 lb 8 oz (83.2 kg)   LMP 12/19/2018   SpO2 98%   BMI 27.90 kg/m    Physical Exam Constitutional:      General: She is not in acute distress.    Appearance:  She is well-developed. She is not diaphoretic.  HENT:     Head: Normocephalic and atraumatic.     Right Ear: Ear canal normal. A middle ear effusion is present. Tympanic membrane is scarred. Tympanic membrane is not injected, erythematous or bulging.     Left Ear: Ear canal normal. A middle ear effusion is present. Tympanic membrane is scarred. Tympanic membrane is not injected, erythematous or bulging.     Nose: Mucosal edema and rhinorrhea present.     Right Sinus: Maxillary sinus tenderness and frontal sinus tenderness present.     Left Sinus: Maxillary sinus tenderness and frontal sinus tenderness present.     Mouth/Throat:     Pharynx: Uvula midline. Posterior oropharyngeal erythema present. No oropharyngeal exudate.     Tonsils: Swelling: 0 on the right. 0 on the left.  Eyes:     General: No scleral icterus.    Conjunctiva/sclera: Conjunctivae normal.  Neck:     Musculoskeletal: Neck supple.  Cardiovascular:     Rate and Rhythm: Normal rate and regular rhythm.     Heart sounds: Normal heart sounds. No murmur.  Pulmonary:     Effort: Pulmonary effort is normal. No respiratory distress.     Breath sounds: Normal breath sounds. No wheezing or rhonchi.  Lymphadenopathy:     Cervical: Cervical adenopathy present.  Skin:    General: Skin is warm and dry.  Capillary Refill: Capillary refill takes less than 2 seconds.  Neurological:     Mental Status: She is alert.           Assessment & Plan:   Problem List Items Addressed This Visit    None    Visit Diagnoses    Acute recurrent pansinusitis    -  Primary   Relevant Medications   amoxicillin-clavulanate (AUGMENTIN) 875-125 MG tablet     Given persistence of symptoms following treatment will do additional round of abx for sinusitis. Discussed that the cough could linger for weeks following illness but to return if sinus symptoms persist  Advised Flonase and neti pot as well as abx  No follow-ups on file.  Lynnda ChildJessica  R Cody, MD

## 2018-12-20 NOTE — Patient Instructions (Addendum)
  1. Drink plenty of fluids 2. Get lots of rest  Sinus Congestion 1) Neti Pot (Saline rinse) -- 2 times day -- if tolerated 2) Flonase (Store Brand ok) - once daily 3) Over the counter congestion medications  Cough 1) Cough drops can be helpful 2) Nyquil (or nighttime cough medication) 3) Honey is proven to be one of the best cough medications   Sore Throat 1) Honey as above, cough drops 2) Ibuprofen or Aleve can be helpful 3) Salt water Gargles  If your symptoms are not improved after you finish the antibiotics then return to clinic for re-evaluation  A cough can linger for up to 6 weeks after a viral injection

## 2018-12-22 DIAGNOSIS — F419 Anxiety disorder, unspecified: Secondary | ICD-10-CM | POA: Diagnosis not present

## 2018-12-22 DIAGNOSIS — F6381 Intermittent explosive disorder: Secondary | ICD-10-CM | POA: Diagnosis not present

## 2019-01-19 ENCOUNTER — Ambulatory Visit: Payer: Self-pay

## 2019-01-19 NOTE — Telephone Encounter (Signed)
Patient has appointment tomorrow am at 9am with Mayra Reel, NP.

## 2019-01-19 NOTE — Telephone Encounter (Signed)
Pt. Reports she has had vaginal bleeding that is "a lot worse than normal.Passing clots." Saturating "thin pads or liners." Will get heavy pads today from the store.Mild back pain.No availability in office today. Husband is her transportation and can not bring her to another location today. Appointment made for tomorrow. Instructed if bleeding worsens or pain increases, to go to UC or ED. States she is also having diarrhea.   Reason for Disposition . [1] Periods with > 6 soaked pads or tampons per day AND [2] last > 7 days  Answer Assessment - Initial Assessment Questions 1. AMOUNT: "Describe the bleeding that you are having."    - SPOTTING: spotting, or pinkish / brownish mucous discharge; does not fill panti-liner or pad    - MILD:  less than 1 pad / hour; less than patient's usual menstrual bleeding   - MODERATE: 1-2 pads / hour; 1 menstrual cup every 6 hours; small-medium blood clots (e.g., pea, grape, small coin)   - SEVERE: soaking 2 or more pads/hour for 2 or more hours; 1 menstrual cup every 2 hours; bleeding not contained by pads or continuous red blood from vagina; large blood clots (e.g., golf ball, large coin)      Moderate 2. ONSET: "When did the bleeding begin?" "Is it continuing now?"     Last night became worse 3. MENSTRUAL PERIOD: "When was the last normal menstrual period?" "How is this different than your period?"     Last month 4. REGULARITY: "How regular are your periods?"     Yes 5. ABDOMINAL PAIN: "Do you have any pain?" "How bad is the pain?"  (e.g., Scale 1-10; mild, moderate, or severe)   - MILD (1-3): doesn't interfere with normal activities, abdomen soft and not tender to touch    - MODERATE (4-7): interferes with normal activities or awakens from sleep, tender to touch    - SEVERE (8-10): excruciating pain, doubled over, unable to do any normal activities      Mild pain in her back 6. PREGNANCY: "Could you be pregnant?" "Are you sexually active?" "Did you recently  give birth?"     No 7. BREASTFEEDING: "Are you breastfeeding?"     No 8. HORMONES: "Are you taking any hormone medications, prescription or OTC?" (e.g., birth control pills, estrogen)     No 9. BLOOD THINNERS: "Do you take any blood thinners?" (e.g., Coumadin/warfarin, Pradaxa/dabigatran, aspirin)     No 10. CAUSE: "What do you think is causing the bleeding?" (e.g., recent gyn surgery, recent gyn procedure; known bleeding disorder, cervical cancer, polycystic ovarian disease, fibroids)         Unsure 11. HEMODYNAMIC STATUS: "Are you weak or feeling lightheaded?" If so, ask: "Can you stand and walk normally?"        Feels weak 12. OTHER SYMPTOMS: "What other symptoms are you having with the bleeding?" (e.g., passed tissue, vaginal discharge, fever, menstrual-type cramps)       Diarrhea  Protocols used: VAGINAL BLEEDING - ABNORMAL-A-AH

## 2019-01-20 ENCOUNTER — Encounter: Payer: Self-pay | Admitting: Primary Care

## 2019-01-20 ENCOUNTER — Ambulatory Visit (INDEPENDENT_AMBULATORY_CARE_PROVIDER_SITE_OTHER): Payer: Medicare HMO | Admitting: Primary Care

## 2019-01-20 VITALS — BP 122/78 | HR 82 | Temp 98.3°F | Ht 68.0 in | Wt 184.0 lb

## 2019-01-20 DIAGNOSIS — R350 Frequency of micturition: Secondary | ICD-10-CM | POA: Diagnosis not present

## 2019-01-20 DIAGNOSIS — N939 Abnormal uterine and vaginal bleeding, unspecified: Secondary | ICD-10-CM | POA: Diagnosis not present

## 2019-01-20 LAB — POC URINALSYSI DIPSTICK (AUTOMATED)
Bilirubin, UA: NEGATIVE
Glucose, UA: NEGATIVE
Ketones, UA: NEGATIVE
Leukocytes, UA: NEGATIVE
Nitrite, UA: NEGATIVE
Protein, UA: NEGATIVE
Spec Grav, UA: 1.015 (ref 1.010–1.025)
UROBILINOGEN UA: 0.2 U/dL
pH, UA: 7.5 (ref 5.0–8.0)

## 2019-01-20 NOTE — Progress Notes (Signed)
Subjective:    Patient ID: Kathy Gamble, female    DOB: [birth year], 43 y.o.   MRN: [id number]  HPI  Kathy Gamble is a 43 year old female with a history of GERD, allergic rhinitis, eustacian tube dysfunction who presents today with a chief complaint of vaginal bleeding.  She began her menstrual cycle on February 1st. Has had heavier menstrual cycle this month when compared to prior months. She is using both pads and tampons, she is going through 3 tampons daily, 2 pads daily. Has felt some lower back pian. She's been taking Midol OTC with some improvement. Today she's noticing her bleeding is lighter overall. Also with some urinary frequency.  She denies other vaginal discharge, fevers, abdominal pain, dysuria. She has menstrual cycles once monthly which will last one week in duration. She denies changes to her diet, extreme exercise, increased stress.   Review of Systems  Constitutional: Negative for fever.  Gastrointestinal: Negative for abdominal pain.  Genitourinary: Positive for frequency and menstrual problem. Negative for dysuria and vaginal discharge.  Musculoskeletal: Positive for back pain.       Past Medical History:  Diagnosis Date  . Allergy   . Bee sting allergy 03/02/2015  . Chicken pox   . Frequent headaches   . GERD (gastroesophageal reflux disease)      Social History   Socioeconomic History  . Marital status: Married    Spouse name: Not on file  . Number of children: Not on file  . Years of education: Not on file  . Highest education level: Not on file  Occupational History  . Not on file  Social Needs  . Financial resource strain: Not on file  . Food insecurity:    Worry: Not on file    Inability: Not on file  . Transportation needs:    Medical: Not on file    Non-medical: Not on file  Tobacco Use  . Smoking status: Never Smoker  . Smokeless tobacco: Never Used  Substance and Sexual Activity  . Alcohol use: No    Alcohol/week: 0.0 standard drinks    . Drug use: Yes    Types: Cocaine  . Sexual activity: Yes    Birth control/protection: None  Lifestyle  . Physical activity:    Days per week: Not on file    Minutes per session: Not on file  . Stress: Not on file  Relationships  . Social connections:    Talks on phone: Not on file    Gets together: Not on file    Attends religious service: Not on file    Active member of club or organization: Not on file    Attends meetings of clubs or organizations: Not on file    Relationship status: Not on file  . Intimate partner violence:    Fear of current or ex partner: Not on file    Emotionally abused: Not on file    Physically abused: Not on file    Forced sexual activity: Not on file  Other Topics Concern  . Not on file  Social History Narrative  . Not on file    Past Surgical History:  Procedure Laterality Date  . ADENOIDECTOMY    . TONSILLECTOMY      Family History  Problem Relation Age of Onset  . Stroke Mother   . Mental retardation Mother   . Cancer Maternal Grandmother   . Cancer Paternal Grandmother        Breast  .  Heart disease Paternal Grandfather     Allergies  Allergen Reactions  . Bee Venom Itching  . Latex Rash    The powder in the gloves causes rash    Current Outpatient Medications on File Prior to Visit  Medication Sig Dispense Refill  . albuterol (VENTOLIN HFA) 108 (90 Base) MCG/ACT inhaler INHALE ONE OR TWO PUFFS INTO THE LUNGS EVERY 6 HOURS AS NEEDED FOR WHEEZING OR SHORTNESS OF BREATH. 18 Inhaler 0  . fluticasone (FLONASE) 50 MCG/ACT nasal spray Place 1 spray into both nostrils 2 (two) times daily. 16 g 0  . levocetirizine (XYZAL) 5 MG tablet TAKE 1 TABLET BY MOUTH EVERY DAY IN THE EVENING 90 tablet 1  . montelukast (SINGULAIR) 10 MG tablet TAKE 1 TABLET BY MOUTH DAILY FOR SINUSES. 90 tablet 0  . Multiple Vitamin (MULTIVITAMIN) capsule Take 1 capsule by mouth daily.    . Omega-3 Fatty Acids (FISH OIL) 1000 MG CAPS Take 1 capsule by mouth  daily.    . sodium chloride (OCEAN) 0.65 % SOLN nasal spray Place 1 spray into both nostrils as needed for congestion.    . Vitamin D, Cholecalciferol, 1000 units CAPS Take by mouth.     No current facility-administered medications on file prior to visit.     BP 122/78   Pulse 82   Temp 98.3 F (36.8 C) (Oral)   Ht 5\' 8"  (1.727 m)   Wt 184 lb (83.5 kg)   LMP 01/20/2019   SpO2 97%   BMI 27.98 kg/m    Objective:   Physical Exam  Constitutional: She appears well-nourished.  Cardiovascular: Normal rate and regular rhythm.  GI: Soft. Bowel sounds are normal. There is no abdominal tenderness.  Genitourinary: There is no tenderness or lesion on the right labia. There is no tenderness or lesion on the left labia. Cervix exhibits no motion tenderness.    No vaginal discharge or erythema.  No erythema in the vagina.    Genitourinary Comments: Mild bleeding from cervix.    Skin: Skin is warm and dry.           Assessment & Plan:  Vaginal Bleeding:  Occurring during regularly scheduled menstrual cycle, overall lighter today. Pelvic exam toady overall unremarkable, mild bleeding noted. Vitals WNL, doesn't appear anemic. Discussed that some cycles can be heavier than others but to notify us if she continues her cycle beyond her typical cycle.  Wet prep pending. UA today with 3+ blood, negative otherwise.  Doreene NestKatherine K Verlaine Embry, NP

## 2019-01-20 NOTE — Patient Instructions (Signed)
Monitor your bleeding, you should continue to get lighter over the next 1-2 days.  Please call Rene Kocher if your cycle continues for 1-2 weeks.  We will be in touch with your vaginal swab results once received.  It was a pleasure to see you today!

## 2019-01-21 LAB — WET PREP BY MOLECULAR PROBE
Candida species: NOT DETECTED
Gardnerella vaginalis: NOT DETECTED
MICRO NUMBER:: 166807
SPECIMEN QUALITY:: ADEQUATE
Trichomonas vaginosis: NOT DETECTED

## 2019-01-25 DIAGNOSIS — F331 Major depressive disorder, recurrent, moderate: Secondary | ICD-10-CM | POA: Diagnosis not present

## 2019-02-08 ENCOUNTER — Ambulatory Visit (HOSPITAL_COMMUNITY): Payer: Self-pay | Admitting: Psychiatry

## 2019-03-21 ENCOUNTER — Other Ambulatory Visit: Payer: Self-pay | Admitting: Internal Medicine

## 2019-03-28 ENCOUNTER — Ambulatory Visit (INDEPENDENT_AMBULATORY_CARE_PROVIDER_SITE_OTHER): Payer: Medicare HMO | Admitting: Internal Medicine

## 2019-03-28 ENCOUNTER — Encounter: Payer: Self-pay | Admitting: Internal Medicine

## 2019-03-28 DIAGNOSIS — S40262A Insect bite (nonvenomous) of left shoulder, initial encounter: Secondary | ICD-10-CM

## 2019-03-28 DIAGNOSIS — W57XXXA Bitten or stung by nonvenomous insect and other nonvenomous arthropods, initial encounter: Secondary | ICD-10-CM

## 2019-03-28 NOTE — Progress Notes (Signed)
Virtual Visit via Video Note  I connected with Kathy Gamble on 03/28/19 at  3:00 PM EDT by a video enabled telemedicine application and verified that I am speaking with the correct person using two identifiers.   I discussed the limitations of evaluation and management by telemedicine and the availability of in person appointments. The patient expressed understanding and agreed to proceed.  Pt Location: Home  Provider Location: Office  History of Present Illness:  Pt presents to the clinic today with c/o tick bite of left shoulder. She reports this occurred 2 days ago. She has no idea how long the tick was on her but is was not engorged. She reports a little redness but no warmth, pain or drainage. She denies confusion, fever, chills, body aches, nausea or diarrhea. She has used some Neosporin with good relief.   Observations/Objective:  Alert and oriented x 3 NAD Small red papule noted of anterior left shoulder No evidence of Erythema Migrans No evidence of cellulitis   Assessment and Plan:  Tick Bite of Left Shoulder:  Advised her this looks like a local reaction only Advised to try Hydrocortisone 1% cream and Neosporin Monitor for increase redness, warmth or drainage  Follow Up Instructions:    I discussed the assessment and treatment plan with the patient. The patient was provided an opportunity to ask questions and all were answered. The patient agreed with the plan and demonstrated an understanding of the instructions.   The patient was advised to call back or seek an in-person evaluation if the symptoms worsen or if the condition fails to improve as anticipated.    Nicki Reaper, NP

## 2019-03-28 NOTE — Patient Instructions (Signed)

## 2019-03-31 ENCOUNTER — Ambulatory Visit (INDEPENDENT_AMBULATORY_CARE_PROVIDER_SITE_OTHER): Payer: Medicare HMO | Admitting: Internal Medicine

## 2019-03-31 ENCOUNTER — Encounter: Payer: Self-pay | Admitting: Internal Medicine

## 2019-03-31 DIAGNOSIS — J301 Allergic rhinitis due to pollen: Secondary | ICD-10-CM

## 2019-03-31 NOTE — Progress Notes (Signed)
Virtual Visit via Video Note  I connected with Kathy Gamble on 03/31/19 at  4:00 PM EDT by a video enabled telemedicine application and verified that I am speaking with the correct person using two identifiers.   I discussed the limitations of evaluation and management by telemedicine and the availability of in person appointments. The patient expressed understanding and agreed to proceed.  Patient Location: In the Car Provider Location: Office  History of Present Illness:  Pt reports fatigue, headache and ear pain. This started 1 week ago. The headache is located in her forehead. She describes the pain as pressure. She denies dizziness or visual changes. She reports some nasal congestion and ear fullness. She denies runny nose, sore throat. She denies fever, chills or body aches. She is taking Flonase and Ocean spray but no antihistamine OTC. She did get bit by a tick 3 days ago. She has been covering that area with Neosporin and Hydrocortisone Cream. She has not noticed any increased redness.   Observations/Objective:  Alert and oriented x 2 NAD Obvious nasal congestion No cough or SOB.  Assessment and Plan:  Allergic Rhinitis:  Start Allegra in addition to Flonase If no improvement Monday, call and let me know.  Follow Up Instructions:    I discussed the assessment and treatment plan with the patient. The patient was provided an opportunity to ask questions and all were answered. The patient agreed with the plan and demonstrated an understanding of the instructions.   The patient was advised to call back or seek an in-person evaluation if the symptoms worsen or if the condition fails to improve as anticipated.    Nicki Reaper, NP

## 2019-03-31 NOTE — Patient Instructions (Signed)

## 2019-04-24 ENCOUNTER — Telehealth: Payer: Self-pay

## 2019-04-24 NOTE — Telephone Encounter (Signed)
Copied from CRM [id number]. Topic: General - Other >> Apr 21, 2019  4:43 PM Jay Schlichter wrote: Reason for CRM:  pt calling about refill on montelukast (SINGULAIR) 10 MG tablet.  Pharm is on back order and pt is asking for a substitute to be called in.  Please call [phone]

## 2019-04-24 NOTE — Telephone Encounter (Signed)
Unable to reach pt or her husband to verify which pharmacy pt would be using.

## 2019-04-24 NOTE — Telephone Encounter (Signed)
There is no substitute. She will have to just use Xyzal until the Singulair is no longer back ordered.

## 2019-05-30 ENCOUNTER — Encounter: Payer: Self-pay | Admitting: Internal Medicine

## 2019-05-30 ENCOUNTER — Ambulatory Visit (INDEPENDENT_AMBULATORY_CARE_PROVIDER_SITE_OTHER): Payer: Medicare HMO | Admitting: Internal Medicine

## 2019-05-30 ENCOUNTER — Other Ambulatory Visit: Payer: Self-pay

## 2019-05-30 VITALS — BP 120/80 | HR 93 | Temp 98.1°F | Wt 195.0 lb

## 2019-05-30 DIAGNOSIS — M25475 Effusion, left foot: Secondary | ICD-10-CM | POA: Diagnosis not present

## 2019-05-30 MED ORDER — CEPHALEXIN 500 MG PO CAPS: 500.0000 mg | ORAL_CAPSULE | Freq: Three times a day (TID) | ORAL | 0 refills | Status: DC

## 2019-05-30 MED ORDER — PREDNISONE 10 MG PO TABS: ORAL_TABLET | ORAL | 0 refills | Status: DC

## 2019-05-30 NOTE — Progress Notes (Signed)
Subjective:    Patient ID: Kathy Gamble, female    DOB: 1976-11-03, 43 y.o.   MRN: [id number]  HPI  Patient presents to the clinic today with c/o of swelling and redness at the base of her great toe, her left foot.  She first noticed these symptoms last Friday after walking through her neighbors yard to get her dog.  Associated symptoms include pain (5-7/10), itching, burning, and swelling.  She has tried OTC Aleve and ice packs with minimal relief. She denies any injury to the area.  Review of Systems      Past Medical History:  Diagnosis Date  . Allergy   . Bee sting allergy 03/02/2015  . Chicken pox   . Frequent headaches   . GERD (gastroesophageal reflux disease)     Current Outpatient Medications  Medication Sig Dispense Refill  . albuterol (VENTOLIN HFA) 108 (90 Base) MCG/ACT inhaler INHALE ONE OR TWO PUFFS INTO THE LUNGS EVERY 6 HOURS AS NEEDED FOR WHEEZING OR SHORTNESS OF BREATH. 18 Inhaler 0  . fluticasone (FLONASE) 50 MCG/ACT nasal spray Place 1 spray into both nostrils 2 (two) times daily. 16 g 0  . levocetirizine (XYZAL) 5 MG tablet TAKE 1 TABLET BY MOUTH EVERY DAY IN THE EVENING 90 tablet 1  . montelukast (SINGULAIR) 10 MG tablet TAKE 1 TABLET BY MOUTH DAILY FOR SINUSES. 90 tablet 0  . Multiple Vitamin (MULTIVITAMIN) capsule Take 1 capsule by mouth daily.    . Omega-3 Fatty Acids (FISH OIL) 1000 MG CAPS Take 1 capsule by mouth daily.    . sodium chloride (OCEAN) 0.65 % SOLN nasal spray Place 1 spray into both nostrils as needed for congestion.    . cephALEXin (KEFLEX) 500 MG capsule Take 1 capsule (500 mg total) by mouth 3 (three) times daily. 21 capsule 0  . predniSONE (DELTASONE) 10 MG tablet Take 6 tabs day 1, 5 tabs day 2, 4 tabs day 3, 3 tabs day 4, 2 tabs day 5, 1 tab day 6 21 tablet 0   No current facility-administered medications for this visit.     Allergies  Allergen Reactions  . Bee Venom Itching  . Latex Rash    The powder in the gloves causes rash     Family History  Problem Relation Age of Onset  . Stroke Mother   . Mental retardation Mother   . Cancer Maternal Grandmother   . Cancer Paternal Grandmother        Breast  . Heart disease Paternal Grandfather     Social History   Socioeconomic History  . Marital status: Married    Spouse name: Not on file  . Number of children: Not on file  . Years of education: Not on file  . Highest education level: Not on file  Occupational History  . Not on file  Social Needs  . Financial resource strain: Not on file  . Food insecurity    Worry: Not on file    Inability: Not on file  . Transportation needs    Medical: Not on file    Non-medical: Not on file  Tobacco Use  . Smoking status: Never Smoker  . Smokeless tobacco: Never Used  Substance and Sexual Activity  . Alcohol use: No    Alcohol/week: 0.0 standard drinks  . Drug use: Yes    Types: Cocaine  . Sexual activity: Yes    Birth control/protection: None  Lifestyle  . Physical activity    Days per week:  Not on file    Minutes per session: Not on file  . Stress: Not on file  Relationships  . Social Herbalist on phone: Not on file    Gets together: Not on file    Attends religious service: Not on file    Active member of club or organization: Not on file    Attends meetings of clubs or organizations: Not on file    Relationship status: Not on file  . Intimate partner violence    Fear of current or ex partner: Not on file    Emotionally abused: Not on file    Physically abused: Not on file    Forced sexual activity: Not on file  Other Topics Concern  . Not on file  Social History Narrative  . Not on file     Constitutional: Denies fever, malaise, fatigue, headache or abrupt weight changes.  Respiratory: Denies difficulty breathing, shortness of breath, cough or sputum production.   Cardiovascular: Denies chest pain, chest tightness, palpitations or swelling in the hands or feet.   Musculoskeletal: Positive for joint pain and swelling. Denies decrease in range of motion, difficulty with gait, muscle pain.  Skin: Positive for itching, burning, swelling and redness to left MTP.  Denies any other redness, rashes, lesions or ulcercations.   No other specific complaints in a complete review of systems (except as listed in HPI above).  Objective:   Physical Exam  BP 120/80   Pulse 93   Temp 98.1 F (36.7 C) (Oral)   Wt 195 lb (88.5 kg)   SpO2 98%   BMI 29.65 kg/m  Wt Readings from Last 3 Encounters:  05/30/19 195 lb (88.5 kg)  01/20/19 184 lb (83.5 kg)  12/20/18 183 lb 8 oz (83.2 kg)    General: Appears her stated age, well developed, well nourished in NAD. Skin: Localized area of swelling, erythema to the left MTP, tenderto touch.  No obvious puncture wound or break in skin.  Musculoskeletal: Normal range of motion with slight discomfort during passive ROM of left great toe. No obvious signs of difficulty with gait.  Neurological: Alert and oriented.    BMET    Component Value Date/Time   NA 139 06/30/2017 1531   K 3.7 06/30/2017 1531   CL 108 06/30/2017 1531   CO2 25 06/30/2017 1531   GLUCOSE 82 06/30/2017 1531   BUN 23 06/30/2017 1531   CREATININE 0.75 06/30/2017 1531   CALCIUM 8.7 06/30/2017 1531   GFRNONAA >60 09/22/2010 1020   GFRAA  09/22/2010 1020    >60        The eGFR has been calculated using the MDRD equation. This calculation has not been validated in all clinical situations. eGFR's persistently <60 mL/min signify possible Chronic Kidney Disease.    Lipid Panel     Component Value Date/Time   CHOL 131 06/30/2017 1531   TRIG 61.0 06/30/2017 1531   HDL 45.70 06/30/2017 1531   CHOLHDL 3 06/30/2017 1531   VLDL 12.2 06/30/2017 1531   LDLCALC 73 06/30/2017 1531    CBC    Component Value Date/Time   WBC 6.6 06/30/2017 1531   RBC 4.20 06/30/2017 1531   HGB 13.0 06/30/2017 1531   HCT 38.5 06/30/2017 1531   PLT 257.0  06/30/2017 1531   MCV 91.7 06/30/2017 1531   MCH 30.2 09/03/2011 1454   MCHC 33.8 06/30/2017 1531   RDW 13.1 06/30/2017 1531   LYMPHSABS 2.9 05/17/2015 1334  MONOABS 0.3 05/17/2015 1334   EOSABS 0.1 05/17/2015 1334   BASOSABS 0.0 05/17/2015 1334    Hgb A1C Lab Results  Component Value Date   HGBA1C 4.7 06/30/2017            Assessment & Plan:   Swelling/Redness of Left MTP.  Gout vs Cellulitis Rx for Prednisone taper sent to pharmacy for suspected gout flare. Rx for Keflex sent to pharmacy for suspected cellulitis. Encouraged elevation  Return precautions reviewed. Webb Silversmith, NP

## 2019-05-30 NOTE — Patient Instructions (Signed)
Gout  Gout is painful swelling of your joints. Gout is a type of arthritis. It is caused by having too much uric acid in your body. Uric acid is a chemical that is made when your body breaks down substances called purines. If your body has too much uric acid, sharp crystals can form and build up in your joints. This causes pain and swelling. Gout attacks can happen quickly and be very painful (acute gout). Over time, the attacks can affect more joints and happen more often (chronic gout). What are the causes?  Too much uric acid in your blood. This can happen because: ? Your kidneys do not remove enough uric acid from your blood. ? Your body makes too much uric acid. ? You eat too many foods that are high in purines. These foods include organ meats, some seafood, and beer.  Trauma or stress. What increases the risk?  Having a family history of gout.  Being female and middle-aged.  Being female and having gone through menopause.  Being very overweight (obese).  Drinking alcohol, especially beer.  Not having enough water in the body (being dehydrated).  Losing weight too quickly.  Having an organ transplant.  Having lead poisoning.  Taking certain medicines.  Having kidney disease.  Having a skin condition called psoriasis. What are the signs or symptoms? An attack of acute gout usually happens in just one joint. The most common place is the big toe. Attacks often start at night. Other joints that may be affected include joints of the feet, ankle, knee, fingers, wrist, or elbow. Symptoms of an attack may include:  Very bad pain.  Warmth.  Swelling.  Stiffness.  Shiny, red, or purple skin.  Tenderness. The affected joint may be very painful to touch.  Chills and fever. Chronic gout may cause symptoms more often. More joints may be involved. You may also have white or yellow lumps (tophi) on your hands or feet or in other areas near your joints. How is this treated?   Treatment for this condition has two phases: treating an acute attack and preventing future attacks.  Acute gout treatment may include: ? NSAIDs. ? Steroids. These are taken by mouth or injected into a joint. ? Colchicine. This medicine relieves pain and swelling. It can be given by mouth or through an IV tube.  Preventive treatment may include: ? Taking small doses of NSAIDs or colchicine daily. ? Using a medicine that reduces uric acid levels in your blood. ? Making changes to your diet. You may need to see a food expert (dietitian) about what to eat and drink to prevent gout. Follow these instructions at home: During a gout attack   If told, put ice on the painful area: ? Put ice in a plastic bag. ? Place a towel between your skin and the bag. ? Leave the ice on for 20 minutes, 2-3 times a day.  Raise (elevate) the painful joint above the level of your heart as often as you can.  Rest the joint as much as possible. If the joint is in your leg, you may be given crutches.  Follow instructions from your doctor about what you cannot eat or drink. Avoiding future gout attacks  Eat a low-purine diet. Avoid foods and drinks such as: ? Liver. ? Kidney. ? Anchovies. ? Asparagus. ? Herring. ? Mushrooms. ? Mussels. ? Beer.  Stay at a healthy weight. If you want to lose weight, talk with your doctor. Do not lose weight  too fast.  Start or continue an exercise plan as told by your doctor. Eating and drinking  Drink enough fluids to keep your pee (urine) pale yellow.  If you drink alcohol: ? Limit how much you use to:  0-1 drink a day for women.  0-2 drinks a day for men. ? Be aware of how much alcohol is in your drink. In the [place] one drink equals one 12 oz bottle of beer (355 mL), one 5 oz glass of wine (148 mL), or one 1 oz glass of hard liquor (44 mL). General instructions  Take over-the-counter and prescription medicines only as told by your doctor.  Do not drive or  use heavy machinery while taking prescription pain medicine.  Return to your normal activities as told by your doctor. Ask your doctor what activities are safe for you.  Keep all follow-up visits as told by your doctor. This is important. Contact a doctor if:  You have another gout attack.  You still have symptoms of a gout attack after 10 days of treatment.  You have problems (side effects) because of your medicines.  You have chills or a fever.  You have burning pain when you pee (urinate).  You have pain in your lower back or belly. Get help right away if:  You have very bad pain.  Your pain cannot be controlled.  You cannot pee. Summary  Gout is painful swelling of the joints.  The most common site of pain is the big toe, but it can affect other joints.  Medicines and avoiding some foods can help to prevent and treat gout attacks. This information is not intended to replace advice given to you by your health care provider. Make sure you discuss any questions you have with your health care provider. Document Released: 09/08/2008 Document Revised: 06/22/2018 Document Reviewed: 06/22/2018 Elsevier Interactive Patient Education  2019 [employer] Cellulitis, Adult  Cellulitis is a skin infection. The infected area is often warm, red, swollen, and sore. It occurs most often in the arms and lower legs. It is very important to get treated for this condition. What are the causes? This condition is caused by bacteria. The bacteria enter through a break in the skin, such as a cut, burn, insect bite, open sore, or crack. What increases the risk? This condition is more likely to occur in people who:  Have a weak body defense system (immune system).  Have open cuts, burns, bites, or scrapes on the skin.  Are older than 43 years of age.  Have a blood sugar problem (diabetes).  Have a long-lasting (chronic) liver disease (cirrhosis) or kidney disease.  Are very overweight  (obese).  Have a skin problem, such as: ? Itchy rash (eczema). ? Slow movement of blood in the veins (venous stasis). ? Fluid buildup below the skin (edema).  Have been treated with high-energy rays (radiation).  Use IV drugs. What are the signs or symptoms? Symptoms of this condition include:  Skin that is: ? Red. ? Streaking. ? Spotting. ? Swollen. ? Sore or painful when you touch it. ? Warm.  A fever.  Chills.  Blisters. How is this diagnosed? This condition is diagnosed based on:  Medical history.  Physical exam.  Blood tests.  Imaging tests. How is this treated? Treatment for this condition may include:  Medicines to treat infections or allergies.  Home care, such as: ? Rest. ? Placing cold or warm cloths (compresses) on the skin.  Hospital care, if the  condition is very bad. Follow these instructions at home: Medicines  Take over-the-counter and prescription medicines only as told by your doctor.  If you were prescribed an antibiotic medicine, take it as told by your doctor. Do not stop taking it even if you start to feel better. General instructions   Drink enough fluid to keep your pee (urine) pale yellow.  Do not touch or rub the infected area.  Raise (elevate) the infected area above the level of your heart while you are sitting or lying down.  Place cold or warm cloths on the area as told by your doctor.  Keep all follow-up visits as told by your doctor. This is important. Contact a doctor if:  You have a fever.  You do not start to get better after 1-2 days of treatment.  Your bone or joint under the infected area starts to hurt after the skin has healed.  Your infection comes back. This can happen in the same area or another area.  You have a swollen bump in the area.  You have new symptoms.  You feel ill and have muscle aches and pains. Get help right away if:  Your symptoms get worse.  You feel very sleepy.  You throw  up (vomit) or have watery poop (diarrhea) for a long time.  You see red streaks coming from the area.  Your red area gets larger.  Your red area turns dark in color. These symptoms may represent a serious problem that is an emergency. Do not wait to see if the symptoms will go away. Get medical help right away. Call your local emergency services (911 in the [place] Do not drive yourself to the hospital. Summary  Cellulitis is a skin infection. The area is often warm, red, swollen, and sore.  This condition is treated with medicines, rest, and cold and warm cloths.  Take all medicines only as told by your doctor.  Tell your doctor if symptoms do not start to get better after 1-2 days of treatment. This information is not intended to replace advice given to you by your health care provider. Make sure you discuss any questions you have with your health care provider. Document Released: 05/18/2008 Document Revised: 04/21/2018 Document Reviewed: 04/21/2018 Elsevier Interactive Patient Education  2019 [employer].

## 2019-06-02 ENCOUNTER — Telehealth: Payer: Self-pay

## 2019-06-02 NOTE — Telephone Encounter (Signed)
Pt called to give update on foot and wants to know if it sounds like her foot is better. Pt said her foot still has redness but is no longer purple in color. Swelling has gone down to size of 50 cent piece and 05/30/19 was size of golf ball. Pain is a lot better; not hurting nearly as much as on 05/30/19.the red area does not feel warm to the touch.no fever. Pt still taking prednisone and abx. Pt again wanted to know if foot was better. I advised since symptoms are improving yes it sounds like her foot is better but advised pt to continue taking prednisone and abx as instructed. Pt and pts husband voiced understanding. FYI to Avie Echevaria NP.

## 2019-06-03 NOTE — Telephone Encounter (Signed)
Yes, seems better. Continue Prednisone and ABX until she has completed the course.

## 2019-06-08 ENCOUNTER — Telehealth: Payer: Self-pay

## 2019-06-08 NOTE — Telephone Encounter (Signed)
She should use her Flonase and probably call her ENT. I am full today and tomorrow. If she wants to see another provider in this office, she can schedule an appt.

## 2019-06-08 NOTE — Telephone Encounter (Signed)
Pt is complaining of ear pain today. Has episodes of diarrhea which is not a new issue. Asking if she can do an OV or VV today or tomorrow. Please advise at [phone]

## 2019-06-13 ENCOUNTER — Ambulatory Visit (INDEPENDENT_AMBULATORY_CARE_PROVIDER_SITE_OTHER): Payer: Medicare HMO | Admitting: Family Medicine

## 2019-06-13 ENCOUNTER — Encounter: Payer: Self-pay | Admitting: Family Medicine

## 2019-06-13 ENCOUNTER — Other Ambulatory Visit: Payer: Self-pay

## 2019-06-13 VITALS — Temp 96.8°F

## 2019-06-13 DIAGNOSIS — J0110 Acute frontal sinusitis, unspecified: Secondary | ICD-10-CM | POA: Diagnosis not present

## 2019-06-13 MED ORDER — AMOXICILLIN-POT CLAVULANATE 875-125 MG PO TABS: 1.0000 | ORAL_TABLET | Freq: Two times a day (BID) | ORAL | 0 refills | Status: AC

## 2019-06-13 NOTE — Progress Notes (Signed)
I connected with Kathy Gamble on 06/13/19 at  8:00 AM EDT by video and verified that I am speaking with the correct person using two identifiers.   I discussed the limitations, risks, security and privacy concerns of performing an evaluation and management service by video and the availability of in person appointments. I also discussed with the patient that there may be a patient responsible charge related to this service. The patient expressed understanding and agreed to proceed.  Patient location: Home Provider Location: [facility] Participants: Lesleigh Noe and Kathy Gamble   Subjective:     Kathy Gamble is a 43 y.o. female presenting for Ear Pain (both ears, piercing pain. started this week.)     Otalgia  There is pain in both ears. This is a new problem. The current episode started in the past 7 days. Episode frequency: mostly when going outside. There has been no fever. Associated symptoms include coughing (some mucus), headaches and a sore throat. Pertinent negatives include no diarrhea, ear discharge, rhinorrhea or vomiting. Treatments tried: antihistamine, flonase.   Was getting allergy shots, but this was not finished - this was 2 years ago  Also getting foot pain when walking - improved with more support  Also having some dental pain which started around the same time as the ear  Review of Systems  HENT: Positive for congestion, ear pain, sinus pressure, sinus pain, sore throat and tinnitus. Negative for ear discharge and rhinorrhea.   Respiratory: Positive for cough (some mucus). Negative for shortness of breath and wheezing.   Cardiovascular: Negative for chest pain.  Gastrointestinal: Negative for diarrhea, nausea and vomiting.  Neurological: Positive for headaches.     Social History   Tobacco Use  Smoking Status Never Smoker  Smokeless Tobacco Never Used        Objective:   BP Readings from Last 3 Encounters:  05/30/19 120/80  01/20/19  122/78  12/20/18 110/84   Wt Readings from Last 3 Encounters:  05/30/19 195 lb (88.5 kg)  01/20/19 184 lb (83.5 kg)  12/20/18 183 lb 8 oz (83.2 kg)    Temp (!) 96.8 F (36 C) Comment: per patient   Physical Exam Constitutional:      Appearance: Normal appearance. She is not ill-appearing.  HENT:     Head: Normocephalic and atraumatic.     Right Ear: External ear normal.     Left Ear: External ear normal.  Eyes:     Conjunctiva/sclera: Conjunctivae normal.  Pulmonary:     Effort: Pulmonary effort is normal. No respiratory distress.  Neurological:     Mental Status: She is alert. Mental status is at baseline.  Psychiatric:        Mood and Affect: Mood normal.        Behavior: Behavior normal.        Thought Content: Thought content normal.        Judgment: Judgment normal.            Assessment & Plan:   Problem List Items Addressed This Visit    None    Visit Diagnoses    Acute non-recurrent frontal sinusitis    -  Primary   Relevant Medications   amoxicillin-clavulanate (AUGMENTIN) 875-125 MG tablet     Recommend in office visit is symptoms do not resolved Dental appointment if dental pain does not improve with abx  Return in about 1 week (around 06/20/2019), or if symptoms worsen or fail  to improve.  Lesleigh Noe, MD

## 2019-07-03 ENCOUNTER — Encounter: Payer: Self-pay | Admitting: Family Medicine

## 2019-07-03 ENCOUNTER — Ambulatory Visit (INDEPENDENT_AMBULATORY_CARE_PROVIDER_SITE_OTHER): Payer: Medicare HMO | Admitting: Family Medicine

## 2019-07-03 ENCOUNTER — Other Ambulatory Visit: Payer: Self-pay

## 2019-07-03 VITALS — BP 120/76 | HR 74 | Temp 98.0°F | Ht 68.0 in | Wt 202.0 lb

## 2019-07-03 DIAGNOSIS — H6980 Other specified disorders of Eustachian tube, unspecified ear: Secondary | ICD-10-CM | POA: Diagnosis not present

## 2019-07-03 NOTE — Patient Instructions (Signed)
Kathy Gamble is who is in your chart for ENT - would recommend looking to make sure this matches where you had been before   Sinus/Ear pain - Ibuprofen/tylenol as needed - Neti pot 2 times daily - flonase - can also try pseudoephedrine  Would recommend connected with ENT given persistent symptoms

## 2019-07-03 NOTE — Assessment & Plan Note (Signed)
Suspect this is the likely diagnosis. Discussed that given some features of sinusitis (HA, face pain) could consider Abx but may also resolved with saline rinse, flonase which she plans to try. Also discussed as this is recurrent and given TM scarring recommended ENT referral. Pt had been in the past in 2018 so she plans to see if she can return to that provider. If she needs a new referral will plan to place.

## 2019-07-03 NOTE — Progress Notes (Signed)
Subjective:     Kathy Gamble is a 43 y.o. female presenting for Ear Pain (sx x 1 week. both ears hurt, h/a, some runny nose. )     Otalgia  There is pain in both ears. This is a new problem. The current episode started 1 to 4 weeks ago. The problem occurs constantly. The problem has been unchanged. There has been no fever. The pain is mild. Associated symptoms include headaches and rhinorrhea. Pertinent negatives include no abdominal pain, coughing, diarrhea, ear discharge, hearing loss, sore throat or vomiting. She has tried nothing for the symptoms.     Got better after the last episode x 1 week  No known sick contact   Review of Systems  Constitutional: Negative for chills and fever.  HENT: Positive for dental problem, ear pain, rhinorrhea and sinus pain. Negative for congestion, ear discharge, hearing loss, sinus pressure, sneezing, sore throat, tinnitus and trouble swallowing.   Respiratory: Negative for cough, shortness of breath and wheezing.   Gastrointestinal: Negative for abdominal pain, diarrhea and vomiting.  Musculoskeletal: Negative for arthralgias and myalgias.  Neurological: Positive for headaches.    06/13/2019: Virtual - sinusitis - treated with augmentin. Ear pain at that time  Social History   Tobacco Use  Smoking Status Never Smoker  Smokeless Tobacco Never Used        Objective:    BP Readings from Last 3 Encounters:  07/03/19 120/76  05/30/19 120/80  01/20/19 122/78   Wt Readings from Last 3 Encounters:  07/03/19 202 lb (91.6 kg)  05/30/19 195 lb (88.5 kg)  01/20/19 184 lb (83.5 kg)    BP 120/76   Pulse 74   Temp 98 F (36.7 C)   Ht 5\' 8"  (1.727 m)   Wt 202 lb (91.6 kg)   SpO2 99%   BMI 30.71 kg/m    Physical Exam Constitutional:      General: She is not in acute distress.    Appearance: She is well-developed. She is not diaphoretic.  HENT:     Head: Normocephalic and atraumatic.     Right Ear: Ear canal normal. Tympanic  membrane is scarred. Tympanic membrane is not injected, erythematous or bulging.     Left Ear: Ear canal normal. Tympanic membrane is scarred. Tympanic membrane is not injected, erythematous or bulging.     Nose: Mucosal edema and rhinorrhea present.     Right Sinus: No maxillary sinus tenderness or frontal sinus tenderness.     Left Sinus: No maxillary sinus tenderness or frontal sinus tenderness.     Mouth/Throat:     Pharynx: Uvula midline. Posterior oropharyngeal erythema present. No oropharyngeal exudate.     Tonsils: 0 on the right. 0 on the left.  Eyes:     General: No scleral icterus.    Conjunctiva/sclera: Conjunctivae normal.  Neck:     Musculoskeletal: Neck supple.  Cardiovascular:     Rate and Rhythm: Normal rate and regular rhythm.     Heart sounds: Normal heart sounds. No murmur.  Pulmonary:     Effort: Pulmonary effort is normal. No respiratory distress.     Breath sounds: Normal breath sounds.  Lymphadenopathy:     Cervical: No cervical adenopathy.  Skin:    General: Skin is warm and dry.     Capillary Refill: Capillary refill takes less than 2 seconds.  Neurological:     Mental Status: She is alert.           Assessment &  Plan:   Problem List Items Addressed This Visit      Nervous and Auditory   ETD (eustachian tube dysfunction) - Primary    Suspect this is the likely diagnosis. Discussed that given some features of sinusitis (HA, face pain) could consider Abx but may also resolved with saline rinse, flonase which she plans to try. Also discussed as this is recurrent and given TM scarring recommended ENT referral. Pt had been in the past in 2018 so she plans to see if she can return to that provider. If she needs a new referral will plan to place.           Return if symptoms worsen or fail to improve.  Lesleigh Noe, MD

## 2019-08-08 ENCOUNTER — Other Ambulatory Visit: Payer: Self-pay | Admitting: Internal Medicine

## 2019-08-28 ENCOUNTER — Ambulatory Visit: Payer: Medicare HMO | Admitting: Family Medicine

## 2019-08-28 ENCOUNTER — Encounter: Payer: Self-pay | Admitting: Family Medicine

## 2019-08-28 NOTE — Progress Notes (Signed)
Virtual Visit via Video Note  I connected with Kathy Gamble on 08/28/19 at 11:15 AM EDT by a video enabled telemedicine application and verified that I am speaking with the correct person using two identifiers.  Location: Patient: home Provider: office    I discussed the limitations of evaluation and management by telemedicine and the availability of in person appointments. The patient expressed understanding and agreed to proceed.  History of Present Illness: 43 yo pt of NP [facility]  Presents with L foot redness/swelling  Pt no showed virtual visit    Observations/Objective:   Assessment and Plan:   Follow Up Instructions:    I discussed the assessment and treatment plan with the patient. The patient was provided an opportunity to ask questions and all were answered. The patient agreed with the plan and demonstrated an understanding of the instructions.   The patient was advised to call back or seek an in-person evaluation if the symptoms worsen or if the condition fails to improve as anticipated.   Patient no showed visit today  Loura Pardon, MD

## 2019-08-29 ENCOUNTER — Encounter: Payer: Self-pay | Admitting: Family Medicine

## 2019-08-29 ENCOUNTER — Ambulatory Visit (INDEPENDENT_AMBULATORY_CARE_PROVIDER_SITE_OTHER): Payer: Medicare HMO | Admitting: Family Medicine

## 2019-08-29 VITALS — Ht 68.0 in

## 2019-08-29 DIAGNOSIS — R21 Rash and other nonspecific skin eruption: Secondary | ICD-10-CM

## 2019-08-29 DIAGNOSIS — L539 Erythematous condition, unspecified: Secondary | ICD-10-CM | POA: Insufficient documentation

## 2019-08-29 MED ORDER — TRIAMCINOLONE ACETONIDE 0.1 % EX CREA: 1.0000 "application " | TOPICAL_CREAM | Freq: Two times a day (BID) | CUTANEOUS | 0 refills | Status: DC

## 2019-08-29 NOTE — Progress Notes (Signed)
VIRTUAL VISIT Due to national recommendations of social distancing due to [place] 19, a virtual visit is felt to be most appropriate for this patient at this time.   I connected with the patient on 08/29/19 at  4:00 PM EDT by virtual telehealth platform and verified that I am speaking with the correct person using two identifiers.   I discussed the limitations, risks, security and privacy concerns of performing an evaluation and management service by  virtual telehealth platform and the availability of in person appointments. I also discussed with the patient that there may be a patient responsible charge related to this service. The patient expressed understanding and agreed to proceed.  Patient location: Home Provider Location: [facility] Participants: Eliezer Lofts and Colin Ina   Chief Complaint  Patient presents with  . Foot Swelling    Left foot-with redness x 3 days    History of Present Illness:   43 year old female presents with new onset left foot rash on left heel x 3 days. No heat   She reports redness and swelling in arch left foot, 2 red spots on sole of foot.. mild pain 3-4/on pain scale.  Very itchy. May have been bit by a spider. She had been outside late.  Applying ice, applying diclofenac cream. She is using Xyzal and singulair.  No DM, no history of DVT.  COVID 19 screen No recent travel or known exposure to COVID19 The patient denies respiratory symptoms of COVID 19 at this time.  The importance of social distancing was discussed today.   Review of Systems  Constitutional: Negative for chills and fever.  HENT: Negative for congestion and ear pain.   Eyes: Negative for pain and redness.  Respiratory: Negative for cough and shortness of breath.   Cardiovascular: Negative for chest pain, palpitations and leg swelling.  Gastrointestinal: Negative for abdominal pain, blood in stool, constipation, diarrhea, nausea and vomiting.  Genitourinary:  Negative for dysuria.  Musculoskeletal: Negative for falls and myalgias.  Skin: Positive for itching and rash.  Neurological: Negative for dizziness.  Psychiatric/Behavioral: Negative for depression. The patient is not nervous/anxious.       Past Medical History:  Diagnosis Date  . Allergy   . Bee sting allergy 03/02/2015  . Chicken pox   . Frequent headaches   . GERD (gastroesophageal reflux disease)     reports that she has never smoked. She has never used smokeless tobacco. She reports current drug use. Drug: Cocaine. She reports that she does not drink alcohol.   Current Outpatient Medications:  .  albuterol (VENTOLIN HFA) 108 (90 Base) MCG/ACT inhaler, INHALE ONE OR TWO PUFFS INTO THE LUNGS EVERY 6 HOURS AS NEEDED FOR WHEEZING OR SHORTNESS OF BREATH., Disp: 18 Inhaler, Rfl: 0 .  fluticasone (FLONASE) 50 MCG/ACT nasal spray, Place 1 spray into both nostrils 2 (two) times daily., Disp: 16 g, Rfl: 0 .  levocetirizine (XYZAL) 5 MG tablet, TAKE 1 TABLET BY MOUTH EVERY DAY IN THE EVENING, Disp: 90 tablet, Rfl: 1 .  montelukast (SINGULAIR) 10 MG tablet, TAKE 1 TABLET BY MOUTH DAILY FOR SINUSES., Disp: 90 tablet, Rfl: 0 .  Multiple Vitamin (MULTIVITAMIN) capsule, Take 1 capsule by mouth daily., Disp: , Rfl:  .  Omega-3 Fatty Acids (FISH OIL) 1000 MG CAPS, Take 1 capsule by mouth daily., Disp: , Rfl:  .  sodium chloride (OCEAN) 0.65 % SOLN nasal spray, Place 1 spray into both nostrils as needed for congestion., Disp: , Rfl:  Observations/Objective: Height 5\' 8"  (1.727 m), last menstrual period 08/22/2019.  Physical Exam  Left ankle... 2 pink irregular raised lesions laterally, no streaking, no swelling in foot or ankle.  Assessment and Plan   Rash Appears consistent with insect bite or allergic reaction. Treat with topical steroid.   I discussed the assessment and treatment plan with the patient. The patient was provided an opportunity to ask questions and all were answered. The  patient agreed with the plan and demonstrated an understanding of the instructions.   The patient was advised to call back or seek an in-person evaluation if the symptoms worsen or if the condition fails to improve as anticipated.     Kerby NoraAmy Tamario Heal, MD

## 2019-08-31 ENCOUNTER — Encounter: Payer: Self-pay | Admitting: Family Medicine

## 2019-08-31 NOTE — Assessment & Plan Note (Signed)
Appears consistent with insect bite or allergic reaction. Treat with topical steroid.

## 2019-09-29 ENCOUNTER — Ambulatory Visit: Payer: Medicare HMO | Admitting: Family Medicine

## 2019-09-29 ENCOUNTER — Other Ambulatory Visit: Payer: Self-pay

## 2019-09-29 ENCOUNTER — Ambulatory Visit (INDEPENDENT_AMBULATORY_CARE_PROVIDER_SITE_OTHER): Payer: Medicare HMO | Admitting: Family Medicine

## 2019-09-29 ENCOUNTER — Encounter: Payer: Self-pay | Admitting: Family Medicine

## 2019-09-29 VITALS — BP 134/86 | HR 70 | Temp 97.9°F | Ht 68.0 in | Wt 202.4 lb

## 2019-09-29 DIAGNOSIS — H6980 Other specified disorders of Eustachian tube, unspecified ear: Secondary | ICD-10-CM

## 2019-09-29 DIAGNOSIS — J302 Other seasonal allergic rhinitis: Secondary | ICD-10-CM | POA: Diagnosis not present

## 2019-09-29 MED ORDER — FLUTICASONE PROPIONATE 50 MCG/ACT NA SUSP: 1.0000 | Freq: Every day | NASAL | 5 refills | Status: DC

## 2019-09-29 NOTE — Patient Instructions (Addendum)
Use the flonase 2 sprays in each nostril twice daily for 3 days  Then decrease to once daily   This should help your allergies/sinus pressure and ears Update if not starting to improve in a week or if worsening    I think you have ear pressure built up from sinus pressure  No ear infection right now - but I want to make sure you don't get one   Keep [place] posted

## 2019-09-29 NOTE — Progress Notes (Signed)
Subjective:    Patient ID: Kathy Gamble, female    DOB: 05-Nov-1976, 43 y.o.   MRN: [id number]  HPI Here for ear pain- bilat  Both ears are hurting  No drainage from ears A little trouble hearing-she cleaned them out last night  A headache from sinus pressure -all around her eyes  No color to mucous- not getting much out  She has sneezing occ during allergies when outsode No fever   Uses flonase -but not every day   43 yo pt of NP Baity   She does have h/o seasonal allergic rhinitis  flonase xyzal singulair Saline nasal spray  She has also had ETD in the past  Uses flonase off and on   Patient Active Problem List   Diagnosis Date Noted   Foot erythema 08/29/2019   Seasonal allergic rhinitis 09/20/2018   ETD (eustachian tube dysfunction) 03/30/2018   Gastroesophageal reflux disease without esophagitis 12/28/2014   Rash 05/20/2009   Past Medical History:  Diagnosis Date   Allergy    Bee sting allergy 03/02/2015   Chicken pox    Frequent headaches    GERD (gastroesophageal reflux disease)    Past Surgical History:  Procedure Laterality Date   ADENOIDECTOMY     TONSILLECTOMY     Social History   Tobacco Use   Smoking status: Never Smoker   Smokeless tobacco: Never Used  Substance Use Topics   Alcohol use: No    Alcohol/week: 0.0 standard drinks   Drug use: Yes    Types: Cocaine   Family History  Problem Relation Age of Onset   Stroke Mother    Mental retardation Mother    Cancer Maternal Grandmother    Cancer Paternal Grandmother        Breast   Heart disease Paternal Grandfather    Allergies  Allergen Reactions   Bee Venom Itching   Latex Rash    The powder in the gloves causes rash   Current Outpatient Medications on File Prior to Visit  Medication Sig Dispense Refill   albuterol (VENTOLIN HFA) 108 (90 Base) MCG/ACT inhaler INHALE ONE OR TWO PUFFS INTO THE LUNGS EVERY 6 HOURS AS NEEDED FOR WHEEZING OR SHORTNESS OF  BREATH. 18 Inhaler 0   levocetirizine (XYZAL) 5 MG tablet TAKE 1 TABLET BY MOUTH EVERY DAY IN THE EVENING 90 tablet 1   montelukast (SINGULAIR) 10 MG tablet TAKE 1 TABLET BY MOUTH DAILY FOR SINUSES. 90 tablet 0   Multiple Vitamin (MULTIVITAMIN) capsule Take 1 capsule by mouth daily.     Omega-3 Fatty Acids (FISH OIL) 1000 MG CAPS Take 1 capsule by mouth daily.     sodium chloride (OCEAN) 0.65 % SOLN nasal spray Place 1 spray into both nostrils as needed for congestion.     triamcinolone cream (KENALOG) 0.1 % Apply 1 application topically 2 (two) times daily. 15 g 0   No current facility-administered medications on file prior to visit.      Review of Systems  Constitutional: Negative for activity change, appetite change, fatigue, fever and unexpected weight change.  HENT: Positive for congestion, ear pain, hearing loss and sinus pressure. Negative for ear discharge, facial swelling, rhinorrhea and sore throat.   Eyes: Negative for pain, redness and visual disturbance.  Respiratory: Negative for cough, shortness of breath and wheezing.   Cardiovascular: Negative for chest pain and palpitations.  Gastrointestinal: Negative for abdominal pain, blood in stool, constipation and diarrhea.  Endocrine: Negative for polydipsia and polyuria.  Genitourinary: Negative for dysuria, frequency and urgency.  Musculoskeletal: Negative for arthralgias, back pain and myalgias.  Skin: Negative for pallor and rash.  Allergic/Immunologic: Negative for environmental allergies.  Neurological: Negative for dizziness, syncope and headaches.  Hematological: Negative for adenopathy. Does not bruise/bleed easily.  Psychiatric/Behavioral: Negative for decreased concentration and dysphoric mood. The patient is not nervous/anxious.        Objective:   Physical Exam Constitutional:      General: She is not in acute distress.    Appearance: Normal appearance. She is obese. She is not ill-appearing.  HENT:      Head: Normocephalic and atraumatic.     Right Ear: Ear canal and external ear normal. There is no impacted cerumen.     Left Ear: Ear canal and external ear normal. There is no impacted cerumen.     Ears:     Comments: Bilateral TMs are moderately scarred and retracted/dull  No erythema or bulging  No cerumen     Nose:     Comments: Nares are boggy    Mouth/Throat:     Mouth: Mucous membranes are moist.  Eyes:     General:        Right eye: No discharge.        Left eye: No discharge.     Extraocular Movements: Extraocular movements intact.     Conjunctiva/sclera: Conjunctivae normal.     Pupils: Pupils are equal, round, and reactive to light.  Neck:     Musculoskeletal: Normal range of motion and neck supple.  Cardiovascular:     Rate and Rhythm: Normal rate and regular rhythm.  Pulmonary:     Effort: Pulmonary effort is normal. No respiratory distress.     Breath sounds: Normal breath sounds. No wheezing or rales.  Lymphadenopathy:     Cervical: No cervical adenopathy.  Skin:    General: Skin is warm and dry.     Findings: No erythema or rash.  Neurological:     Mental Status: She is alert.     Cranial Nerves: No cranial nerve deficit.  Psychiatric:        Mood and Affect: Mood normal.           Assessment & Plan:   Problem List Items Addressed This Visit      Respiratory   Seasonal allergic rhinitis   Relevant Medications   fluticasone (FLONASE) 50 MCG/ACT nasal spray     Nervous and Auditory   ETD (eustachian tube dysfunction) - Primary    With remote hx of myringotomy tubes as a kid and much scarring  Allergies and sinus pressure worse inst to start using flonase much more regularly Bid for 3 d and then daily  Sent in px  inst to watch for worse pain/fever or other new symptoms  Update if not starting to improve in a week or if worsening  Consider ENT if no improvement

## 2019-09-29 NOTE — Assessment & Plan Note (Signed)
With remote hx of myringotomy tubes as a kid and much scarring  Allergies and sinus pressure worse inst to start using flonase much more regularly Bid for 3 d and then daily  Sent in px  inst to watch for worse pain/fever or other new symptoms  Update if not starting to improve in a week or if worsening  Consider ENT if no improvement

## 2019-11-21 ENCOUNTER — Other Ambulatory Visit: Payer: Self-pay | Admitting: Family Medicine

## 2019-11-21 DIAGNOSIS — J302 Other seasonal allergic rhinitis: Secondary | ICD-10-CM

## 2019-11-23 ENCOUNTER — Ambulatory Visit: Payer: Medicare HMO | Admitting: Internal Medicine

## 2019-12-19 ENCOUNTER — Other Ambulatory Visit: Payer: Self-pay | Admitting: Internal Medicine

## 2019-12-19 DIAGNOSIS — J302 Other seasonal allergic rhinitis: Secondary | ICD-10-CM

## 2019-12-29 ENCOUNTER — Other Ambulatory Visit: Payer: Self-pay | Admitting: Internal Medicine

## 2019-12-29 ENCOUNTER — Other Ambulatory Visit: Payer: Self-pay | Admitting: Family Medicine

## 2019-12-29 DIAGNOSIS — J302 Other seasonal allergic rhinitis: Secondary | ICD-10-CM

## 2020-02-16 ENCOUNTER — Other Ambulatory Visit: Payer: Self-pay | Admitting: Internal Medicine

## 2020-02-16 DIAGNOSIS — J302 Other seasonal allergic rhinitis: Secondary | ICD-10-CM

## 2020-03-28 ENCOUNTER — Other Ambulatory Visit: Payer: Self-pay | Admitting: Internal Medicine

## 2020-04-10 ENCOUNTER — Other Ambulatory Visit: Payer: Self-pay | Admitting: Internal Medicine

## 2020-04-18 ENCOUNTER — Telehealth (INDEPENDENT_AMBULATORY_CARE_PROVIDER_SITE_OTHER): Payer: Medicare HMO | Admitting: Internal Medicine

## 2020-04-18 ENCOUNTER — Encounter: Payer: Self-pay | Admitting: Internal Medicine

## 2020-04-18 DIAGNOSIS — R519 Headache, unspecified: Secondary | ICD-10-CM | POA: Diagnosis not present

## 2020-04-18 DIAGNOSIS — M542 Cervicalgia: Secondary | ICD-10-CM

## 2020-04-18 DIAGNOSIS — R202 Paresthesia of skin: Secondary | ICD-10-CM | POA: Diagnosis not present

## 2020-04-18 MED ORDER — PREDNISONE 10 MG PO TABS: ORAL_TABLET | ORAL | 0 refills | Status: DC

## 2020-04-18 NOTE — Progress Notes (Signed)
Virtual Visit via Video Note  I connected with Kathy Gamble on 04/18/20 at  3:00 PM EDT by a video enabled telemedicine application and verified that I am speaking with the correct person using two identifiers.  Location: Patient: Home Provider: Office   I discussed the limitations of evaluation and management by telemedicine and the availability of in person appointments. The patient expressed understanding and agreed to proceed.  History of Present Illness:  Pt reports headaches. This started 1-2 weeks ago. The headaches are located in her forehead and the back of her head. She describes the pain as sharp, stabbing. The pain is intermittent, but she has no idea what triggers this. She denies dizziness or visual changes. She does have some pain in her neck which she describes the pain as sharp, stabbing. She denies any neck or back injury. She has some tingling in her hands but denies numbness or weakness. She has been taking Ibuprofen and Aleve with minimal relief.   Past Medical History:  Diagnosis Date  . Allergy   . Bee sting allergy 03/02/2015  . Chicken pox   . Frequent headaches   . GERD (gastroesophageal reflux disease)     Current Outpatient Medications  Medication Sig Dispense Refill  . albuterol (VENTOLIN HFA) 108 (90 Base) MCG/ACT inhaler INHALE ONE OR TWO PUFFS INTO THE LUNGS EVERY 6 HOURS AS NEEDED FOR WHEEZING OR SHORTNESS OF BREATH. 8 g 1  . fluticasone (FLONASE) 50 MCG/ACT nasal spray PLACE 1 SPRAY INTO BOTH NOSTRILS DAILY. 48 mL 0  . levocetirizine (XYZAL) 5 MG tablet TAKE 1 TABLET BY MOUTH EVERY DAY IN THE EVENING 90 tablet 1  . montelukast (SINGULAIR) 10 MG tablet TAKE 1 TABLET BY MOUTH DAILY FOR SINUSES. 90 tablet 0  . Multiple Vitamin (MULTIVITAMIN) capsule Take 1 capsule by mouth daily.    . Omega-3 Fatty Acids (FISH OIL) 1000 MG CAPS Take 1 capsule by mouth daily.    . sodium chloride (OCEAN) 0.65 % SOLN nasal spray Place 1 spray into both nostrils as needed for  congestion.    . triamcinolone cream (KENALOG) 0.1 % APPLY TO AFFECTED AREA TWICE A DAY 15 g 0   No current facility-administered medications for this visit.    Allergies  Allergen Reactions  . Bee Venom Itching  . Latex Rash    The powder in the gloves causes rash    Family History  Problem Relation Age of Onset  . Stroke Mother   . Mental retardation Mother   . Cancer Maternal Grandmother   . Cancer Paternal Grandmother        Breast  . Heart disease Paternal Grandfather     Social History   Socioeconomic History  . Marital status: Married    Spouse name: Not on file  . Number of children: Not on file  . Years of education: Not on file  . Highest education level: Not on file  Occupational History  . Not on file  Tobacco Use  . Smoking status: Never Smoker  . Smokeless tobacco: Never Used  Substance and Sexual Activity  . Alcohol use: No    Alcohol/week: 0.0 standard drinks  . Drug use: Yes    Types: Cocaine  . Sexual activity: Yes    Birth control/protection: None  Other Topics Concern  . Not on file  Social History Narrative  . Not on file   Social Determinants of Health   Financial Resource Strain:   . Difficulty of Paying Living  Expenses:   Food Insecurity:   . Worried About Charity fundraiser in the Last Year:   . Arboriculturist in the Last Year:   Transportation Needs:   . Film/video editor (Medical):   Marland Kitchen Lack of Transportation (Non-Medical):   Physical Activity:   . Days of Exercise per Week:   . Minutes of Exercise per Session:   Stress:   . Feeling of Stress :   Social Connections:   . Frequency of Communication with Friends and Family:   . Frequency of Social Gatherings with Friends and Family:   . Attends Religious Services:   . Active Member of Clubs or Organizations:   . Attends Archivist Meetings:   Marland Kitchen Marital Status:   Intimate Partner Violence:   . Fear of Current or Ex-Partner:   . Emotionally Abused:   Marland Kitchen  Physically Abused:   . Sexually Abused:      Constitutional: Pt reports headaches. Denies fever, malaise, fatigue, or abrupt weight changes.  HEENT: Denies eye pain, eye redness, ear pain, ringing in the ears, wax buildup, runny nose, nasal congestion, bloody nose, or sore throat. Respiratory: Denies difficulty breathing, shortness of breath, cough or sputum production.   Cardiovascular: Denies chest pain, chest tightness, palpitations or swelling in the hands or feet.  Musculoskeletal: Pt reports neck pain. Denies decrease in range of motion, difficulty with gait, muscle pain or joint pain and swelling.  Neurological: Pt reports tingling in hands. Denies dizziness, difficulty with memory, difficulty with speech or problems with balance and coordination.    No other specific complaints in a complete review of systems (except as listed in HPI above).  Observations/Objective:  Wt Readings from Last 3 Encounters:  09/29/19 202 lb 7 oz (91.8 kg)  07/03/19 202 lb (91.6 kg)  05/30/19 195 lb (88.5 kg)    General: Appears herstated age, well developed, well nourished in NAD. Pulmonary/Chest: Normal effort. No respiratory distress.   Neurological: Alert and oriented. Coordination normal.  BMET    Component Value Date/Time   NA 139 06/30/2017 1531   K 3.7 06/30/2017 1531   CL 108 06/30/2017 1531   CO2 25 06/30/2017 1531   GLUCOSE 82 06/30/2017 1531   BUN 23 06/30/2017 1531   CREATININE 0.75 06/30/2017 1531   CALCIUM 8.7 06/30/2017 1531   GFRNONAA >60 09/22/2010 1020   GFRAA  09/22/2010 1020    >60        The eGFR has been calculated using the MDRD equation. This calculation has not been validated in all clinical situations. eGFR's persistently <60 mL/min signify possible Chronic Kidney Disease.    Lipid Panel     Component Value Date/Time   CHOL 131 06/30/2017 1531   TRIG 61.0 06/30/2017 1531   HDL 45.70 06/30/2017 1531   CHOLHDL 3 06/30/2017 1531   VLDL 12.2  06/30/2017 1531   LDLCALC 73 06/30/2017 1531    CBC    Component Value Date/Time   WBC 6.6 06/30/2017 1531   RBC 4.20 06/30/2017 1531   HGB 13.0 06/30/2017 1531   HCT 38.5 06/30/2017 1531   PLT 257.0 06/30/2017 1531   MCV 91.7 06/30/2017 1531   MCH 30.2 09/03/2011 1454   MCHC 33.8 06/30/2017 1531   RDW 13.1 06/30/2017 1531   LYMPHSABS 2.9 05/17/2015 1334   MONOABS 0.3 05/17/2015 1334   EOSABS 0.1 05/17/2015 1334   BASOSABS 0.0 05/17/2015 1334    Hgb A1C Lab Results  Component Value Date  HGBA1C 4.7 06/30/2017        Assessment and Plan:  Acute Non Intractable Headache, Acute Neck Pain, Paresthesia:  ? Cervical radiculitis RX for Pred Taper x 6 days  If no improvement, recommend 30 minute in office follow up appt  Follow Up Instructions:    I discussed the assessment and treatment plan with the patient. The patient was provided an opportunity to ask questions and all were answered. The patient agreed with the plan and demonstrated an understanding of the instructions.   The patient was advised to call back or seek an in-person evaluation if the symptoms worsen or if the condition fails to improve as anticipated.     Webb Silversmith, NP

## 2020-04-18 NOTE — Patient Instructions (Signed)
General Headache Without Cause A headache is pain or discomfort that is felt around the head or neck area. There are many causes and types of headaches. In some cases, the cause may not be found. Follow these instructions at home: Watch your condition for any changes. Let your doctor know about them. Take these steps to help with your condition: Managing pain      Take over-the-counter and prescription medicines only as told by your doctor.  Lie down in a dark, quiet room when you have a headache.  If told, put ice on your head and neck area: ? Put ice in a plastic bag. ? Place a towel between your skin and the bag. ? Leave the ice on for 20 minutes, 2-3 times per day.  If told, put heat on the affected area. Use the heat source that your doctor recommends, such as a moist heat pack or a heating pad. ? Place a towel between your skin and the heat source. ? Leave the heat on for 20-30 minutes. ? Remove the heat if your skin turns bright red. This is very important if you are unable to feel pain, heat, or cold. You may have a greater risk of getting burned.  Keep lights dim if bright lights bother you or make your headaches worse. Eating and drinking  Eat meals on a regular schedule.  If you drink alcohol: ? Limit how much you use to:  0-1 drink a day for women.  0-2 drinks a day for men. ? Be aware of how much alcohol is in your drink. In the [place] one drink equals one 12 oz bottle of beer (355 mL), one 5 oz glass of wine (148 mL), or one 1 oz glass of hard liquor (44 mL).  Stop drinking caffeine, or reduce how much caffeine you drink. General instructions   Keep a journal to find out if certain things bring on headaches. For example, write down: ? What you eat and drink. ? How much sleep you get. ? Any change to your diet or medicines.  Get a massage or try other ways to relax.  Limit stress.  Sit up straight. Do not tighten (tense) your muscles.  Do not use any  products that contain nicotine or tobacco. This includes cigarettes, e-cigarettes, and chewing tobacco. If you need help quitting, ask your doctor.  Exercise regularly as told by your doctor.  Get enough sleep. This often means 7-9 hours of sleep each night.  Keep all follow-up visits as told by your doctor. This is important. Contact a doctor if:  Your symptoms are not helped by medicine.  You have a headache that feels different than the other headaches.  You feel sick to your stomach (nauseous) or you throw up (vomit).  You have a fever. Get help right away if:  Your headache gets very bad quickly.  Your headache gets worse after a lot of physical activity.  You keep throwing up.  You have a stiff neck.  You have trouble seeing.  You have trouble speaking.  You have pain in the eye or ear.  Your muscles are weak or you lose muscle control.  You lose your balance or have trouble walking.  You feel like you will pass out (faint) or you pass out.  You are mixed up (confused).  You have a seizure. Summary  A headache is pain or discomfort that is felt around the head or neck area.  There are many causes and   types of headaches. In some cases, the cause may not be found.  Keep a journal to help find out what causes your headaches. Watch your condition for any changes. Let your doctor know about them.  Contact a doctor if you have a headache that is different from usual, or if your headache is not helped by medicine.  Get help right away if your headache gets very bad, you throw up, you have trouble seeing, you lose your balance, or you have a seizure. This information is not intended to replace advice given to you by your health care provider. Make sure you discuss any questions you have with your health care provider. Document Revised: 06/20/2018 Document Reviewed: 06/20/2018 Elsevier Patient Education  2020 Elsevier Inc.  

## 2020-04-19 ENCOUNTER — Telehealth: Payer: Medicare HMO | Admitting: Internal Medicine

## 2020-07-17 ENCOUNTER — Other Ambulatory Visit: Payer: Self-pay

## 2020-07-18 ENCOUNTER — Ambulatory Visit (INDEPENDENT_AMBULATORY_CARE_PROVIDER_SITE_OTHER): Payer: Medicare HMO | Admitting: Family Medicine

## 2020-07-18 ENCOUNTER — Encounter: Payer: Self-pay | Admitting: Family Medicine

## 2020-07-18 VITALS — BP 124/90 | HR 95 | Temp 96.1°F | Ht 68.0 in | Wt 232.8 lb

## 2020-07-18 DIAGNOSIS — K5900 Constipation, unspecified: Secondary | ICD-10-CM

## 2020-07-18 DIAGNOSIS — R300 Dysuria: Secondary | ICD-10-CM

## 2020-07-18 LAB — POCT URINALYSIS DIPSTICK
Bilirubin, UA: NEGATIVE
Blood, UA: NEGATIVE
Glucose, UA: NEGATIVE
Ketones, UA: NEGATIVE
Leukocytes, UA: NEGATIVE
Nitrite, UA: NEGATIVE
Protein, UA: NEGATIVE
Spec Grav, UA: >=1.03 — AB (ref 1.010–1.025)
Urobilinogen, UA: 0.2 E.U./dL
pH, UA: 6 (ref 5.0–8.0)

## 2020-07-18 MED ORDER — DOCUSATE SODIUM 100 MG PO CAPS: 100.0000 mg | ORAL_CAPSULE | Freq: Two times a day (BID) | ORAL | 1 refills | Status: DC

## 2020-07-18 NOTE — Progress Notes (Signed)
Kathy Gamble is a 44 y.o. female  Chief Complaint  Patient presents with  . Acute Visit    Pt c/o constipation x 1 week.  Pt said that she hasn't had a BM.  Pt has been taking stool softerns.    HPI: Kathy Gamble is a 44 y.o. female complains of constipation and no BM x 1 week. Pt states she normally has a BM every 2 days. Pt notes some lower abdominal cramping. No n/v. Appetite is ok - trying to eat more fruits, veggies. She is passing flatus.  She started taking stool softener yesterday.   Pt also complains of dysuria x 1-2 wks but feels it has improved. Urine is darker. No odor. No frequency, urgency.   Past Medical History:  Diagnosis Date  . Allergy   . Bee sting allergy 03/02/2015  . Chicken pox   . Frequent headaches   . GERD (gastroesophageal reflux disease)     Past Surgical History:  Procedure Laterality Date  . ADENOIDECTOMY    . TONSILLECTOMY      Social History   Socioeconomic History  . Marital status: Married    Spouse name: Not on file  . Number of children: Not on file  . Years of education: Not on file  . Highest education level: Not on file  Occupational History  . Not on file  Tobacco Use  . Smoking status: Never Smoker  . Smokeless tobacco: Never Used  Substance and Sexual Activity  . Alcohol use: No    Alcohol/week: 0.0 standard drinks  . Drug use: Yes    Types: Cocaine  . Sexual activity: Yes    Birth control/protection: None  Other Topics Concern  . Not on file  Social History Narrative  . Not on file   Social Determinants of Health   Financial Resource Strain:   . Difficulty of Paying Living Expenses:   Food Insecurity:   . Worried About Programme researcher, broadcasting/film/video in the Last Year:   . Barista in the Last Year:   Transportation Needs:   . Freight forwarder (Medical):   Marland Kitchen Lack of Transportation (Non-Medical):   Physical Activity:   . Days of Exercise per Week:   . Minutes of Exercise per Session:   Stress:   . Feeling  of Stress :   Social Connections:   . Frequency of Communication with Friends and Family:   . Frequency of Social Gatherings with Friends and Family:   . Attends Religious Services:   . Active Member of Clubs or Organizations:   . Attends Banker Meetings:   Marland Kitchen Marital Status:   Intimate Partner Violence:   . Fear of Current or Ex-Partner:   . Emotionally Abused:   Marland Kitchen Physically Abused:   . Sexually Abused:     Family History  Problem Relation Age of Onset  . Stroke Mother   . Mental retardation Mother   . Cancer Maternal Grandmother   . Cancer Paternal Grandmother        Breast  . Heart disease Paternal Grandfather      Immunization History  Administered Date(s) Administered  . Influenza Inj Mdck Quad Pf 11/01/2019  . Influenza,inj,Quad PF,6+ Mos 01/02/2015, 09/13/2016, 09/20/2018  . PFIZER SARS-COV-2 Vaccination 03/21/2020, 04/12/2020  . Tdap 01/02/2015    Outpatient Encounter Medications as of 07/18/2020  Medication Sig  . albuterol (VENTOLIN HFA) 108 (90 Base) MCG/ACT inhaler INHALE ONE OR TWO PUFFS INTO THE  LUNGS EVERY 6 HOURS AS NEEDED FOR WHEEZING OR SHORTNESS OF BREATH.  . fluticasone (FLONASE) 50 MCG/ACT nasal spray PLACE 1 SPRAY INTO BOTH NOSTRILS DAILY.  Marland Kitchen levocetirizine (XYZAL) 5 MG tablet TAKE 1 TABLET BY MOUTH EVERY DAY IN THE EVENING  . montelukast (SINGULAIR) 10 MG tablet TAKE 1 TABLET BY MOUTH DAILY FOR SINUSES.  . Multiple Vitamin (MULTIVITAMIN) capsule Take 1 capsule by mouth daily.  Marland Kitchen docusate sodium (COLACE) 100 MG capsule Take 1 capsule (100 mg total) by mouth 2 (two) times daily.  . Omega-3 Fatty Acids (FISH OIL) 1000 MG CAPS Take 1 capsule by mouth daily. (Patient not taking: Reported on 07/18/2020)  . predniSONE (DELTASONE) 10 MG tablet Take 3 tabs on days 1-2, take 2 tabs on days 3-4, take 1 tab on days 5-6 (Patient not taking: Reported on 07/18/2020)  . sodium chloride (OCEAN) 0.65 % SOLN nasal spray Place 1 spray into both nostrils as needed  for congestion. (Patient not taking: Reported on 07/18/2020)  . triamcinolone cream (KENALOG) 0.1 % APPLY TO AFFECTED AREA TWICE A DAY (Patient not taking: Reported on 07/18/2020)   No facility-administered encounter medications on file as of 07/18/2020.     ROS: Pertinent positives and negatives noted in HPI. Remainder of ROS non-contributory    Allergies  Allergen Reactions  . Bee Venom Itching  . Latex Rash    The powder in the gloves causes rash    BP 124/90 (BP Location: Right Arm, Patient Position: Sitting, Cuff Size: Normal)   Pulse 95   Temp (!) 96.1 F (35.6 C) (Temporal)   Ht 5\' 8"  (1.727 m)   Wt 232 lb 12.8 oz (105.6 kg)   LMP 07/10/2020   SpO2 98%   BMI 35.40 kg/m   Physical Exam Constitutional:      Appearance: Normal appearance.  Abdominal:     General: Bowel sounds are normal. There is no distension.     Palpations: Abdomen is soft.     Tenderness: There is abdominal tenderness (lower abdominal TTP w/o rebound or guarding). There is no guarding or rebound.  Neurological:     Mental Status: She is alert. Mental status is at baseline.  Psychiatric:        Behavior: Behavior normal.      A/P:  1. Constipation, unspecified constipation type - no BM x 1 week so recommend miralax BID and colace BID x 2-3 days. If no BM after that time, 1-2 doses of dulcolax, and if no BM in 1 day after that, pt to use enema. Instructions reviewed with pt and pts father and included in AVS Rx: - docusate sodium (COLACE) 100 MG capsule; Take 1 capsule (100 mg total) by mouth 2 (two) times daily.  Dispense: 60 capsule; Refill: 1 - f/u if no BM with above regimen  2. Dysuria - POCT Urinalysis Dipstick - trace leuks but otherwise normal so will send culture based on symptoms improving. Will call pt with result and send abx if needed - Urine Culture    This visit occurred during the SARS-CoV-2 public health emergency.  Safety protocols were in place, including screening  questions prior to the visit, additional usage of staff PPE, and extensive cleaning of exam room while observing appropriate contact time as indicated for disinfecting solutions.

## 2020-07-18 NOTE — Patient Instructions (Addendum)
miralax 1 capful in 6oz water 2x/day Colace (stool softener) 100mg  1 cap 2x/day If no BM in 2-3 days, take 1-2 doses of laxative like dulcolax If no BM in 1 day, use fleet enema If no BM after that time, call office

## 2020-07-19 ENCOUNTER — Telehealth: Payer: Self-pay | Admitting: Family Medicine

## 2020-07-19 LAB — URINE CULTURE
MICRO NUMBER:: 10791975
SPECIMEN QUALITY:: ADEQUATE

## 2020-07-19 NOTE — Telephone Encounter (Signed)
Patient's father called back to check status of having this prescription re-sent. He states the patient is severely constipation and in pain.

## 2020-07-19 NOTE — Telephone Encounter (Signed)
Patient's father called and stated the Colace prescription yesterday should have been sent to [facility] on [address] in [place]. Please re-send the RX so they can pick up ASAP.

## 2020-07-19 NOTE — Telephone Encounter (Signed)
I spoke with pt's father and informed him, medication has been sent to correct pharmacy.

## 2020-07-26 ENCOUNTER — Telehealth: Payer: Self-pay | Admitting: Internal Medicine

## 2020-07-26 NOTE — Telephone Encounter (Signed)
Addressed and documented conversation with pt in lab results.

## 2020-07-26 NOTE — Telephone Encounter (Signed)
Patient was seen on 8/6 by Dr. Salena Saner and is calling back fr results, please advise. CB is [phone].

## 2020-08-05 ENCOUNTER — Telehealth (INDEPENDENT_AMBULATORY_CARE_PROVIDER_SITE_OTHER): Payer: Medicare HMO | Admitting: Family Medicine

## 2020-08-05 ENCOUNTER — Encounter: Payer: Self-pay | Admitting: Family Medicine

## 2020-08-05 VITALS — BP 135/80 | HR 85 | Temp 96.4°F | Ht 68.0 in | Wt 225.0 lb

## 2020-08-05 DIAGNOSIS — J069 Acute upper respiratory infection, unspecified: Secondary | ICD-10-CM

## 2020-08-05 NOTE — Progress Notes (Signed)
Virtual Visit via Video Note  I connected with Kathy Gamble on 08/05/20 at 12:15 PM EDT by a video enabled telemedicine application and verified that I am speaking with the correct person using two identifiers.  Location: Patient: In her home Provider: [facility] Persons participating in virtual visit: Patient, provider, patient's husband   I discussed the limitations of evaluation and management by telemedicine and the availability of in person appointments. The patient expressed understanding and agreed to proceed.  History of Present Illness: Chief Complaint  Patient presents with   Cough    C/o cough, sore throat. Taking cough syrup since Friday, not helping.   This is a 44 year old female who presents today for virtual visit for above chief complaint.  She has had 4-day history of cough with yellow phlegm, sore throat, nasal drainage, frontal headache, ear pressure.  She denies fever, nausea, vomiting, shortness of breath, wheeze.  She has felt fatigued.  She has been taking over-the-counter cough medicine, Aleve and Tylenol as well as her regular medications for allergic rhinitis.  These have provided some relief of symptoms.  Her 60 year old son was recently diagnosed with COVID-19.  The patient is fully vaccinated.   Observations/Objective: Patient is alert and answers questions appropriately.  Visible skin is unremarkable.  Respirations are even and unlabored without increased work of breathing.  No audible wheeze or witnessed cough.  Mood and affect are appropriate. BP 135/80    Pulse 85    Temp (!) 96.4 F (35.8 C) (Temporal)    Ht 5\' 8"  (1.727 m)    Wt 225 lb (102.1 kg)    LMP 07/29/2020 (Approximate)    BMI 34.21 kg/m  Wt Readings from Last 3 Encounters:  08/05/20 225 lb (102.1 kg)  07/18/20 232 lb 12.8 oz (105.6 kg)  09/29/19 202 lb 7 oz (91.8 kg)    Assessment and Plan: 1. Viral URI with cough -Viral symptoms with close Covid exposure.  Discussed importance of  quarantining. -Reviewed symptomatic relief and appropriate doses of Aleve and Tylenol. -Continue medications for allergic rhinitis as well as over-the-counter cough suppressant, good fluid intake and rest -Reviewed follow-up precautions   10/01/19, FNP-BC  [facility], [facility]  08/05/2020 12:47 PM   Follow Up Instructions:    I discussed the assessment and treatment plan with the patient. The patient was provided an opportunity to ask questions and all were answered. The patient agreed with the plan and demonstrated an understanding of the instructions.   The patient was advised to call back or seek an in-person evaluation if the symptoms worsen or if the condition fails to improve as anticipated.   08/07/2020, FNP

## 2020-09-05 ENCOUNTER — Other Ambulatory Visit: Payer: Self-pay | Admitting: Internal Medicine

## 2020-09-13 ENCOUNTER — Other Ambulatory Visit: Payer: Self-pay | Admitting: Internal Medicine

## 2020-09-13 DIAGNOSIS — J302 Other seasonal allergic rhinitis: Secondary | ICD-10-CM

## 2020-11-22 ENCOUNTER — Telehealth (INDEPENDENT_AMBULATORY_CARE_PROVIDER_SITE_OTHER): Payer: Medicare HMO | Admitting: Family Medicine

## 2020-11-22 ENCOUNTER — Encounter: Payer: Self-pay | Admitting: Family Medicine

## 2020-11-22 VITALS — BP 130/90 | Temp 96.9°F | Wt 245.0 lb

## 2020-11-22 DIAGNOSIS — J069 Acute upper respiratory infection, unspecified: Secondary | ICD-10-CM

## 2020-11-22 DIAGNOSIS — H9202 Otalgia, left ear: Secondary | ICD-10-CM

## 2020-11-22 NOTE — Progress Notes (Signed)
Virtual Visit via Video Note  I connected with Kathy Gamble on 11/22/20 at  4:00 PM EST by a video enabled telemedicine application and verified that I am speaking with the correct person using two identifiers.  Location: Patient: In her home Provider: [facility] Persons participating in virtual visit: Patient, provider, patient's husband   I discussed the limitations of evaluation and management by telemedicine and the availability of in person appointments. The patient expressed understanding and agreed to proceed.  History of Present Illness: Chief Complaint  Patient presents with  . Headache  . Ear Pain    Both ears x 1 week   . Cough    Bloody mucous X 1 week   . Nasal Congestion   This is a 44 year old female who presents today for virtual visit for above chief complaint. She  has a past medical history of Allergy, Bee sting allergy (03/02/2015), Chicken pox, Frequent headaches, and GERD (gastroesophageal reflux disease). She reports an approximately 1 week history of frontal headache with light sensitivity, intermittent left ear pain, nasal drainage, intermittent productive cough.  She denies fever, chills, shortness of breath, wheeze.  She had 2 Covid vaccinations earlier this year but has not had a booster.  She has not been tested for COVID-19.  She has been increasing her fluids and she reports taking her regular medications which include fluticasone nasal spray, Xyzal, Singulair.  She has taken albuterol x1 for cough.  She reports that her nasal drainage is white and it is not thick.  She has had a little bloody nasal drainage and one episode of some sputum that was mixed with blood.  She reports that this occurred first thing in the morning and she does admit to having some postnasal drainage.  She is not a smoker.   Observations/Objective: Patient is alert and answers questions appropriately.  She does look to her husband some to help with the history.  Respirations are  even and unlabored without increased work of breathing with normal conversation.  There is no audible wheeze or witnessed cough.  When she pulls on her pinna, she denies pain.  Visible skin is unremarkable.  Mood and affect are appropriate. BP 130/90   Temp (!) 96.9 F (36.1 C) (Temporal)   Wt 245 lb (111.1 kg)   BMI 37.25 kg/m  Wt Readings from Last 3 Encounters:  11/22/20 245 lb (111.1 kg)  08/05/20 225 lb (102.1 kg)  07/18/20 232 lb 12.8 oz (105.6 kg)    Assessment and Plan: 1. Viral URI with cough -Patient reports that she is actually feeling better today than she did yesterday.  No fever, shortness of breath or worrisome symptoms.  Continue symptomatic treatment.  Follow-up precautions reviewed  2. Left ear pain -Review of EMR shows that patient suffers from eustachian tube dysfunction several times a year.  Unfortunately it is difficult to know what is causing her ear pain on a virtual visit but I do not elicit any worrisome symptoms.  Discussed adding a decongestant and Afrin type nasal spray to her current regimen.  I have instructed her to follow-up if not improved in a couple of days.   Olean Ree, FNP-BC  [facility], [facility]  11/22/2020 4:52 PM   Follow Up Instructions:    I discussed the assessment and treatment plan with the patient. The patient was provided an opportunity to ask questions and all were answered. The patient agreed with the plan and  demonstrated an understanding of the instructions.   The patient was advised to call back or seek an in-person evaluation if the symptoms worsen or if the condition fails to improve as anticipated.    Emi Belfast, FNP

## 2021-01-06 ENCOUNTER — Other Ambulatory Visit: Payer: Self-pay | Admitting: Internal Medicine

## 2021-01-08 ENCOUNTER — Telehealth: Payer: Self-pay | Admitting: *Deleted

## 2021-01-08 NOTE — Telephone Encounter (Signed)
Will discuss at upcoming appt.

## 2021-01-08 NOTE — Telephone Encounter (Signed)
Patient called stating that she has had headaches for years and wants Nicki Reaper NP to call her some medication in. Patient stated that she use to see a female doctor that prescribed her Gabapentin. Patient stated that she has not seen the female doctor in a while. Patient stated that she does take tylenol and ibuprofen for the headaches and it eases the pain some. Patient stated that she has a cough and chills but unable to give a date when this started. Patient stated that she would have to do a virtual visit because her husband does not drive any longer and she has no transportation.  Patient's husband got on the phone and he stated that his wife's headaches have been going on as long as he has known her. Advised Mr. Menefee that I can try and get her in with another provider tomorrow if they fell that is necessary because Nicki Reaper NP does not have anything available until Friday. Mr. Westerfeld stated that they feel that this can wait until Friday. Appointment scheduled with Nicki Reaper NP 01/10/21 at 4:15 pm. ER precautions were given to patient and her husband and they verbalized understanding.

## 2021-01-09 ENCOUNTER — Other Ambulatory Visit: Payer: Self-pay | Admitting: Internal Medicine

## 2021-01-09 DIAGNOSIS — K219 Gastro-esophageal reflux disease without esophagitis: Secondary | ICD-10-CM

## 2021-01-10 ENCOUNTER — Telehealth (INDEPENDENT_AMBULATORY_CARE_PROVIDER_SITE_OTHER): Payer: Medicare HMO | Admitting: Internal Medicine

## 2021-01-10 DIAGNOSIS — R519 Headache, unspecified: Secondary | ICD-10-CM

## 2021-01-10 MED ORDER — TOPIRAMATE 25 MG PO TABS
25.0000 mg | ORAL_TABLET | Freq: Every evening | ORAL | 0 refills | Status: DC
Start: 2021-01-10 — End: 2021-05-05

## 2021-01-10 MED ORDER — MONTELUKAST SODIUM 10 MG PO TABS: 10.0000 mg | ORAL_TABLET | Freq: Every day | ORAL | 0 refills | Status: DC

## 2021-01-10 NOTE — Patient Instructions (Signed)
Chronic Migraine Headache A migraine headache is throbbing pain that is usually on one side of the head. Migraines that keep coming back are called recurring migraines. A migraine is called a chronic migraine if it happens at least 15 days in a month for more than 3 months. Talk with your doctor about what things may bring on (trigger) your migraines. What are the causes? The exact cause of this condition is not known. A migraine may be caused when nerves in the brain become irritated and release chemicals that cause irritation and swelling (inflammation) of blood vessels. The irritation and swelling of the blood vessels causes pain. Migraines may be brought on or caused by:  Smoking.  Foods and drinks, such as: ? Cheese. ? Chocolate. ? Alcohol. ? Caffeine.  Certain substances in some foods or drinks.  Some medicines. Other things that may bring on a migraine include:  Periods, for women.  Stress.  Not enough sleep or too much sleep.  Feeling very tired.  Bright lights or loud noises.  Smells  Weather changes and being at high altitude. What increases the risk? The following factors may make you more likely to have chronic migraine:  Having migraines or family members who have them.  Being very sad (depressed) or feeling worried or nervous (anxious).  Taking a lot of pain medicine.  Having problems sleeping.  Having heart disease, diabetes, or being very overweight (obese). What are the signs or symptoms? Symptoms of this condition include:  Pain that feels like it throbs.  Pain that is usually only on one side of the head. In some cases, the pain may be on both sides of the head or around the head or neck.  Very bad pain that keeps you from doing daily activities.  Pain that gets worse with activity.  Feeling like you may vomit (feeling nauseous) or vomiting.  Pain when you are around bright lights, loud noises, or activity.  Being sensitive to bright  lights, loud noises, or smells.  Feeling dizzy. How is this treated? This condition is treated with:  Medicines. These help to: ? Lessen pain and the feeling like you may vomit. ? Prevent migraines.  Changes to your diet or sleep.  Therapy. This might include: ? Relaxation training. ? Biofeedback. This is a treatment that teaches you to relax, use your brain to lower your heart rate, and control your breathing. ? Cognitive behavioral therapy (CBT). This therapy helps you set goals and follow up on the changes that you make.  Acupuncture.  Using a device that provides electrical stimulation to your nerves, which can help take away pain.  Surgery, if the other treatments do not work. Follow these instructions at home: Medicines  Take over-the-counter and prescription medicines only as told by your doctor.  Ask your doctor if the medicine prescribed to you requires you to avoid driving or using machinery. Lifestyle  Do not use any products that contain nicotine or tobacco, such as cigarettes, e-cigarettes, and chewing tobacco. If you need help quitting, ask your doctor.  Do not drink alcohol.  Get 7-9 hours of sleep each night.  Lower the stress in your life. Ask your doctor about ways to do this.  Stay at a healthy weight. Talk with your doctor if you need help losing weight.  Get regular exercise.   General instructions  Keep a journal to find out if certain things bring on migraines. For example, write down: ? What you eat and drink. ? How   much sleep you get. ? Any change to your diet or medicines.  Lie down in a dark, quiet room when you have a migraine.  Try placing a cool towel over your head when you have a migraine.  Keep lights dim if bright lights bother you or make your migraines worse.  Keep all follow-up visits as told by your doctor. This is important.   Where to find more information  Coalition for Headache and Migraine Patients (CHAMP):  headachemigraine.org  American Migraine Foundation: americanmigrainefoundation.org  National Headache Foundation: headaches.org Contact a doctor if:  Medicine does not help your migraine.  Your pain keeps coming back. Get help right away if:  Your migraine becomes really bad and medicine does not help.  You have a stiff neck and fever.  You have trouble seeing.  Your muscles are weak or you lose control of them.  You lose your balance or have trouble walking.  You feel like you will faint or you faint.  You start having sudden, very bad headaches.  You have a seizure. Summary  A migraine headache is very bad, throbbing pain that is usually on one side of the head.  A chronic migraine is a migraine that happens 15 days in a month for more than 3 months.  Talk with your doctor about what things may bring on your migraines.  Lie down in a dark, quiet room when you have a migraine.  Keep a journal. This can help you find out if certain things make you have migraines. This information is not intended to replace advice given to you by your health care provider. Make sure you discuss any questions you have with your health care provider. Document Revised: 01/17/2020 Document Reviewed: 01/17/2020 Elsevier Patient Education  2021 Elsevier Inc.  

## 2021-01-10 NOTE — Progress Notes (Signed)
Virtual Visit via Video Note  I connected with Kathy Gamble on 01/10/21 at  4:15 PM EST by a video enabled telemedicine application and verified that I am speaking with the correct person using two identifiers.  Location: Patient: Home Provider: Office  Person's participating at this video call: Kathy Silversmith, NP-C, Kathy Gamble and Kathy Gamble   I discussed the limitations of evaluation and management by telemedicine and the availability of in person appointments. The patient expressed understanding and agreed to proceed.  History of Present Illness:  Pt reports headache, runny nose, ear pain. This started 1 year ago. The headache is located on the top of her head. She describes the pain as pressure. She reports the pain radiates into her neck and back. She denies dizziness or visual changes but does have some sensitivity to light but not sounds. She is blowing clear mucous out of her nose. She describes the ear pain as pressure without drainage or loss of hearing. She denies sore throat. She denies cough but intermittently has had some shortness of breath. She denies fever, chills or body aches. She reports she is able to sleep off her symptoms, but has tried Tylenol and Ibuprofen OTC with some relief of symptoms.    Past Medical History:  Diagnosis Date  . Allergy   . Bee sting allergy 03/02/2015  . Chicken pox   . Frequent headaches   . GERD (gastroesophageal reflux disease)     Current Outpatient Medications  Medication Sig Dispense Refill  . albuterol (VENTOLIN HFA) 108 (90 Base) MCG/ACT inhaler INHALE ONE OR TWO PUFFS INTO THE LUNGS EVERY 6 HOURS AS NEEDED FOR WHEEZING OR SHORTNESS OF BREATH. 6.7 each 1  . docusate sodium (COLACE) 100 MG capsule Take 1 capsule (100 mg total) by mouth 2 (two) times daily. 60 capsule 1  . fluticasone (FLONASE) 50 MCG/ACT nasal spray PLACE 1 SPRAY INTO BOTH NOSTRILS DAILY. 48 mL 0  . levocetirizine (XYZAL) 5 MG tablet TAKE 1 TABLET BY MOUTH EVERY DAY IN  THE EVENING 90 tablet 0  . montelukast (SINGULAIR) 10 MG tablet TAKE 1 TABLET BY MOUTH DAILY FOR SINUSES. 90 tablet 0  . Multiple Vitamin (MULTIVITAMIN) capsule Take 1 capsule by mouth daily.    . Omega-3 Fatty Acids (FISH OIL) 1000 MG CAPS Take 1 capsule by mouth daily.     . sodium chloride (OCEAN) 0.65 % SOLN nasal spray Place 1 spray into both nostrils as needed for congestion.     . triamcinolone cream (KENALOG) 0.1 % APPLY TO AFFECTED AREA TWICE A DAY 15 g 0   No current facility-administered medications for this visit.    Allergies  Allergen Reactions  . Bee Venom Itching  . Latex Rash    The powder in the gloves causes rash    Family History  Problem Relation Age of Onset  . Stroke Mother   . Mental retardation Mother   . Cancer Maternal Grandmother   . Cancer Paternal Grandmother        Breast  . Heart disease Paternal Grandfather     Social History   Socioeconomic History  . Marital status: Married    Spouse name: Not on file  . Number of children: Not on file  . Years of education: Not on file  . Highest education level: Not on file  Occupational History  . Not on file  Tobacco Use  . Smoking status: Never Smoker  . Smokeless tobacco: Never Used  Substance and Sexual  Activity  . Alcohol use: No    Alcohol/week: 0.0 standard drinks  . Drug use: Yes    Types: Cocaine  . Sexual activity: Yes    Birth control/protection: None  Other Topics Concern  . Not on file  Social History Narrative  . Not on file   Social Determinants of Health   Financial Resource Strain: Not on file  Food Insecurity: Not on file  Transportation Needs: Not on file  Physical Activity: Not on file  Stress: Not on file  Social Connections: Not on file  Intimate Partner Violence: Not on file     Constitutional: Pt reports headache. Denies fever, malaise, fatigue, or abrupt weight changes.  HEENT: Pt reports runny nose, ear pain. Denies eye pain, eye redness, ear pain, ringing  in the ears, wax buildup, nasal congestion, bloody nose, or sore throat. Respiratory: Pt reports intermittent shortness of breath. Denies difficulty breathing, cough or sputum production.   Cardiovascular: Denies chest pain, chest tightness, palpitations or swelling in the hands or feet.   No other specific complaints in a complete review of systems (except as listed in HPI above).  Observations/Objective:   Wt Readings from Last 3 Encounters:  11/22/20 245 lb (111.1 kg)  08/05/20 225 lb (102.1 kg)  07/18/20 232 lb 12.8 oz (105.6 kg)    General: Appears her stated age, obese, in NAD. Pulmonary/Chest: Normal effort. Neurological: Alert and oriented.  Coordination normal.   BMET    Component Value Date/Time   NA 139 06/30/2017 1531   K 3.7 06/30/2017 1531   CL 108 06/30/2017 1531   CO2 25 06/30/2017 1531   GLUCOSE 82 06/30/2017 1531   BUN 23 06/30/2017 1531   CREATININE 0.75 06/30/2017 1531   CALCIUM 8.7 06/30/2017 1531   GFRNONAA >60 09/22/2010 1020   GFRAA  09/22/2010 1020    >60        The eGFR has been calculated using the MDRD equation. This calculation has not been validated in all clinical situations. eGFR's persistently <60 mL/min signify possible Chronic Kidney Disease.    Lipid Panel     Component Value Date/Time   CHOL 131 06/30/2017 1531   TRIG 61.0 06/30/2017 1531   HDL 45.70 06/30/2017 1531   CHOLHDL 3 06/30/2017 1531   VLDL 12.2 06/30/2017 1531   LDLCALC 73 06/30/2017 1531    CBC    Component Value Date/Time   WBC 6.6 06/30/2017 1531   RBC 4.20 06/30/2017 1531   HGB 13.0 06/30/2017 1531   HCT 38.5 06/30/2017 1531   PLT 257.0 06/30/2017 1531   MCV 91.7 06/30/2017 1531   MCH 30.2 09/03/2011 1454   MCHC 33.8 06/30/2017 1531   RDW 13.1 06/30/2017 1531   LYMPHSABS 2.9 05/17/2015 1334   MONOABS 0.3 05/17/2015 1334   EOSABS 0.1 05/17/2015 1334   BASOSABS 0.0 05/17/2015 1334    Hgb A1C Lab Results  Component Value Date   HGBA1C 4.7  06/30/2017       Assessment and Plan:  Frequent Headaches:  Will trial Topamax 25 mg QHS  Update me in 1 month and let me know how you are doing  Follow Up Instructions:    I discussed the assessment and treatment plan with the patient. The patient was provided an opportunity to ask questions and all were answered. The patient agreed with the plan and demonstrated an understanding of the instructions.   The patient was advised to call back or seek an in-person evaluation if the symptoms  worsen or if the condition fails to improve as anticipated.     Kathy Silversmith, NP

## 2021-01-13 ENCOUNTER — Telehealth: Payer: Self-pay | Admitting: Internal Medicine

## 2021-01-13 NOTE — Telephone Encounter (Signed)
Per 01/10/21 video visit noted topamax sent to [facility] pharmacy.

## 2021-01-13 NOTE — Telephone Encounter (Signed)
Pt called in wanted to know about getting something for her headaches.

## 2021-01-13 NOTE — Telephone Encounter (Signed)
[facility] Night - Client Nonclinical Telephone Record AccessNurse Client [facility] Night - Client Client Site [facility] - Night Physician Nicki Reaper - NP Contact Type Call Who Is Calling Patient / Member / Family / Caregiver [name] Name Jiya Kissinger [name] Phone Number [phone] Patient Name Vonzella Althaus Patient DOB 08-14-1976 Call Type Message Only Information Provided Reason for Call Medication Question / Request Initial Comment [name] states she is needing her prescription called in for headaches. Disp. Time Disposition Final User 01/13/2021 7:53:57 AM General Information Provided Yes Bryon Lions, [place] Call Closed By: Richrd Humbles Transaction Date/Time: 01/13/2021 7:51:51 AM (ET)

## 2021-01-20 ENCOUNTER — Telehealth: Payer: Self-pay | Admitting: Internal Medicine

## 2021-01-20 NOTE — Telephone Encounter (Signed)
changed

## 2021-01-20 NOTE — Telephone Encounter (Signed)
Spoke with patient and she gave me the authorization to speak with pharmacy.  She is wanting to change pharmacy to [facility] in [place].

## 2021-01-27 ENCOUNTER — Telehealth: Payer: Self-pay

## 2021-01-27 NOTE — Chronic Care Management (AMB) (Addendum)
° ° °  Chronic Care Management Pharmacy Assistant   Name: Kathy Gamble  MRN: [id number] DOB: Apr 30, 1976  Reason for Encounter: Delivery Coordination   PCP : Lorre Munroe, NP  Allergies:   Allergies  Allergen Reactions   Bee Venom Itching   Latex Rash    The powder in the gloves causes rash    Medications: Outpatient Encounter Medications as of 01/27/2021  Medication Sig   albuterol (VENTOLIN HFA) 108 (90 Base) MCG/ACT inhaler INHALE ONE OR TWO PUFFS INTO THE LUNGS EVERY 6 HOURS AS NEEDED FOR WHEEZING OR SHORTNESS OF BREATH.   docusate sodium (COLACE) 100 MG capsule Take 1 capsule (100 mg total) by mouth 2 (two) times daily.   fluticasone (FLONASE) 50 MCG/ACT nasal spray PLACE 1 SPRAY INTO BOTH NOSTRILS DAILY.   levocetirizine (XYZAL) 5 MG tablet TAKE 1 TABLET BY MOUTH EVERY DAY IN THE EVENING   montelukast (SINGULAIR) 10 MG tablet Take 1 tablet (10 mg total) by mouth at bedtime.   Multiple Vitamin (MULTIVITAMIN) capsule Take 1 capsule by mouth daily.   Omega-3 Fatty Acids (FISH OIL) 1000 MG CAPS Take 1 capsule by mouth daily.    sodium chloride (OCEAN) 0.65 % SOLN nasal spray Place 1 spray into both nostrils as needed for congestion.    topiramate (TOPAMAX) 25 MG tablet Take 1 tablet (25 mg total) by mouth at bedtime.   triamcinolone cream (KENALOG) 0.1 % APPLY TO AFFECTED AREA TWICE A DAY   No facility-administered encounter medications on file as of 01/27/2021.    Current Diagnosis: Patient Active Problem List   Diagnosis Date Noted   Seasonal allergic rhinitis 09/20/2018   ETD (eustachian tube dysfunction) 03/30/2018   Gastroesophageal reflux disease without esophagitis 12/28/2014    Contacted patient to discuss any medications needed for delivery. Patient states that at her last visit with Nicki Reaper, NP she had discussed an inhaler and would like this filled. She is also wanting omeprazole. Patient informed we would have to contact her doctor for both prescriptions.  Once prescriptions for both have been received will have delivery set up for 02/03/21 along with her husbands.   Follow-Up:  Coordination of Enhanced Pharmacy Services and Pharmacist Review  Jomarie Longs, [facility] Clinical Pharmacy Assistant [phone]  Reviewed above request - albuterol on file we can deliver 2/21. Omeprazole not on medication list. Discontinued in 2019. She will need an appointment to discuss prior to refills unless she would like the over the counter dose.  Phil Dopp, PharmD Clinical Pharmacist [facility] [phone]

## 2021-01-28 NOTE — Telephone Encounter (Signed)
Can you let patient know about omeprazole request.  Phil Dopp, PharmD Clinical Pharmacist [facility] at [facility] [phone]

## 2021-01-28 NOTE — Chronic Care Management (AMB) (Addendum)
Kathy Gamble contacted patient to inform her that inhaler would be sent 02/03/21 but she would need to make an appointment with Nicki Reaper for the omeprazole. Patient states that she needs transportation. Explained I would see what we could do.    Follow-Up:  Pharmacist Review  Phil Dopp, CPP notified  Jomarie Longs, [employer] Clinical Pharmacy Assistant [phone]      Marcelino Duster contacted patient to discuss omeprazole request. She usually takes famotidine 20 mg - 1 tablet at bedtime. Reports minimum efficacy. She reports main symptoms is regurgitation of food. This happens during and after meals. She is interested in trying omeprazole again. She has tried to use some of her husbands every now and then. Will see if PCP is willing to prescribe.   She also mentions she has started Topiramate daily at bedtime for headaches about 2 weeks ago. She has noticed some upset stomach and diarrhea since she started it. She has noticed some improvement in headaches. Discussed giving it a little more time to see if upset stomach improves.   Phil Dopp, PharmD Clinical Pharmacist [facility] [phone]

## 2021-01-30 NOTE — Telephone Encounter (Signed)
Discussed GERD with patient. She reports famotidine OTC not beneficial and would like to try omeprazole again. If approved, please send Rx to [facility].   Phil Dopp, PharmD Clinical Pharmacist [facility] at [facility] [phone]

## 2021-01-31 ENCOUNTER — Telehealth: Payer: Self-pay | Admitting: Internal Medicine

## 2021-01-31 NOTE — Progress Notes (Signed)
°  Chronic Care Management   Outreach Note  01/31/2021 Name: KALYSSA ANKER MRN: [id number] DOB: July 17, 1976  Referred by: Lorre Munroe, NP Reason for referral : Chronic Care Management   An unsuccessful telephone outreach was attempted today. The patient was referred to the pharmacist for assistance with care management and care coordination.   Follow Up Plan:   Aggie Hacker  [facility] Scheduler

## 2021-02-01 MED ORDER — OMEPRAZOLE 20 MG PO CPDR
20.0000 mg | DELAYED_RELEASE_CAPSULE | Freq: Every day | ORAL | 0 refills | Status: DC
Start: 2021-02-01 — End: 2021-04-15

## 2021-02-01 NOTE — Telephone Encounter (Signed)
RX sent to pharmacy  

## 2021-02-01 NOTE — Addendum Note (Signed)
Addended by: Lorre Munroe on: 02/01/2021 05:25 PM   Modules accepted: Orders

## 2021-02-03 NOTE — Telephone Encounter (Signed)
[facility] Night - Client Nonclinical Telephone Record  [facility] Client [facility] Night - Client Client Site [facility] [place] - Night Physician Nicki Reaper - NP Contact Type Call Who Is Calling Patient / Member / Family / Caregiver [name] Name Kathy Gamble [name] Phone Number [phone] Call Type Message Only Information Provided Reason for Call Returning a Call from the Office Initial Comment [name] states that she is returning a call to the office. Additional Comment [name] states that she received a message to call [facility] back. The message came from a private line. She is just trying to call back in per the provider's request about the medication for her indigestion. Disp. Time Disposition Final User 02/01/2021 5:40:05 PM General Information Provided Yes Cherylynn Ridges Call Closed By: Cherylynn Ridges Transaction Date/Time: 02/01/2021 5:35:24 PM (ET)

## 2021-02-04 ENCOUNTER — Telehealth: Payer: Self-pay | Admitting: Internal Medicine

## 2021-02-04 NOTE — Progress Notes (Signed)
°  Chronic Care Management   Outreach Note  02/04/2021 Name: BERNYCE BRIMLEY MRN: [id number] DOB: 1976-02-29  Referred by: Lorre Munroe, NP Reason for referral : Chronic Care Management   A second unsuccessful telephone outreach was attempted today. The patient was referred to pharmacist for assistance with care management and care coordination.  Follow Up Plan:   Aggie Hacker  [facility] Scheduler

## 2021-02-06 NOTE — Telephone Encounter (Signed)
Advised that 90 day supply of Rx was sent less than 30 days ago to [facility] order

## 2021-02-06 NOTE — Telephone Encounter (Signed)
I received a call from Lauren at [facility] requesting patient's Singulair and Topiramate to be sent to their pharmacy. She can be reached at [phone] if any questions or concerns.

## 2021-02-10 NOTE — Telephone Encounter (Signed)
Patient is returning your call. EM 

## 2021-02-11 ENCOUNTER — Telehealth: Payer: Self-pay

## 2021-02-11 NOTE — Telephone Encounter (Signed)
Pt calling to report she had a call and is not sure who it was from. Advised not sure who called her. Advised the pharmacist team has tried to call her. She said she was not sure.  She did report she had a red itchy spot behind her knee that was dry and there about 1-2 wks. Pt denies any exudate. She requested an apt. Scheduled pt for 3/3. Advised if anything changed to contact office. Pt verbalized understanding.    Pt called back to report it was the pharmacist team trying to reach her. Clinic gave pt the number for pharmacist team.

## 2021-02-12 NOTE — Telephone Encounter (Signed)
Noted, will discuss at upcoming appt 

## 2021-02-13 ENCOUNTER — Ambulatory Visit: Payer: Medicare HMO | Admitting: Internal Medicine

## 2021-02-18 ENCOUNTER — Telehealth: Payer: Self-pay

## 2021-02-18 NOTE — Chronic Care Management (AMB) (Signed)
° ° °  Chronic Care Management Pharmacy Assistant   Name: Kathy Gamble  MRN: [id number] DOB: June 09, 1976  Reason for Encounter: Refill request from [facility]    Medications: Outpatient Encounter Medications as of 02/18/2021  Medication Sig   albuterol (VENTOLIN HFA) 108 (90 Base) MCG/ACT inhaler INHALE ONE OR TWO PUFFS INTO THE LUNGS EVERY 6 HOURS AS NEEDED FOR WHEEZING OR SHORTNESS OF BREATH.   docusate sodium (COLACE) 100 MG capsule Take 1 capsule (100 mg total) by mouth 2 (two) times daily.   fluticasone (FLONASE) 50 MCG/ACT nasal spray PLACE 1 SPRAY INTO BOTH NOSTRILS DAILY.   levocetirizine (XYZAL) 5 MG tablet TAKE 1 TABLET BY MOUTH EVERY DAY IN THE EVENING   montelukast (SINGULAIR) 10 MG tablet Take 1 tablet (10 mg total) by mouth at bedtime.   Multiple Vitamin (MULTIVITAMIN) capsule Take 1 capsule by mouth daily.   Omega-3 Fatty Acids (FISH OIL) 1000 MG CAPS Take 1 capsule by mouth daily.    omeprazole (PRILOSEC) 20 MG capsule Take 1 capsule (20 mg total) by mouth daily.   sodium chloride (OCEAN) 0.65 % SOLN nasal spray Place 1 spray into both nostrils as needed for congestion.    topiramate (TOPAMAX) 25 MG tablet Take 1 tablet (25 mg total) by mouth at bedtime.   triamcinolone cream (KENALOG) 0.1 % APPLY TO AFFECTED AREA TWICE A DAY   No facility-administered encounter medications on file as of 02/18/2021.    During phone call with patients husband she requested a refill on her inhaler from [facility]. This was requested and set up for delivery on 02/28/21 with delivery of husband's medications. Patient is aware of delivery date and that she will receive a call the morning of delivery.    Follow-Up:  Coordination of Enhanced Pharmacy Services and Pharmacist Review  Phil Dopp, CPP notified  Jomarie Longs, [facility] Clinical Pharmacy Assistant [phone]  Total time spent for month: 16

## 2021-02-20 ENCOUNTER — Ambulatory Visit: Payer: Medicare HMO | Admitting: Internal Medicine

## 2021-03-14 ENCOUNTER — Telehealth (INDEPENDENT_AMBULATORY_CARE_PROVIDER_SITE_OTHER): Payer: Medicare HMO | Admitting: Medical

## 2021-03-14 ENCOUNTER — Telehealth: Payer: Self-pay | Admitting: Internal Medicine

## 2021-03-14 ENCOUNTER — Telehealth: Payer: Self-pay | Admitting: Medical

## 2021-03-14 ENCOUNTER — Other Ambulatory Visit: Payer: Self-pay

## 2021-03-14 VITALS — Temp 97.0°F | Wt 291.0 lb

## 2021-03-14 DIAGNOSIS — H0011 Chalazion right upper eyelid: Secondary | ICD-10-CM

## 2021-03-14 DIAGNOSIS — H00012 Hordeolum externum right lower eyelid: Secondary | ICD-10-CM

## 2021-03-14 MED ORDER — TOBRAMYCIN 0.3 % OP SOLN: 2.0000 [drp] | Freq: Four times a day (QID) | OPHTHALMIC | 0 refills | Status: DC

## 2021-03-14 MED ORDER — TOBRAMYCIN 0.3 % OP SOLN
2.0000 [drp] | Freq: Four times a day (QID) | OPHTHALMIC | 0 refills | Status: DC
Start: 2021-03-14 — End: 2021-03-14

## 2021-03-14 MED ORDER — SULFAMETHOXAZOLE-TRIMETHOPRIM 800-160 MG PO TABS
1.0000 | ORAL_TABLET | Freq: Two times a day (BID) | ORAL | 0 refills | Status: DC
Start: 2021-03-14 — End: 2021-04-15

## 2021-03-14 NOTE — Telephone Encounter (Signed)
Rx tobrex sent to pt pharmacy.

## 2021-03-14 NOTE — Telephone Encounter (Signed)
Sent rx tobrex to her pharmacy.

## 2021-03-14 NOTE — Telephone Encounter (Signed)
Would send it to other pharmacy. Local. Don't want to send in other eye drop as usually other too expensive. Can you ask pt pharmacy use near her home? If you get that pharmacy can you resend.

## 2021-03-14 NOTE — Patient Instructions (Addendum)
Right upper eyelid versus chalazion.  However on video inspection the area does appear to be about the size of a small to large marble.  Difficult to assess due to grainy appearance of video.  Conjunctival clear no discharge.  Will prescribe Tobrex solution 2 drops every 6-8 hours to right.  Also prescribed Bactrim antibiotic in the event of upper lid infection.  If over the weekend you develop eye pain, enlargement of irritated area or vision changes then recommend urgent care or emergency department evaluation.  Recommend calling your primary care office on Monday and asking if someone in the office could see you on Tuesday.  I will be in the office next week and not sure he could follow-up on this area.  If area has not improved at all and determined that chalazion present you might need referral to ophthalmologist.

## 2021-03-14 NOTE — Telephone Encounter (Signed)
M[facility] from [facility] called they are out of the tobramycin until Monday pharmacy wanted to know was there some thing else S[place]ier wanted to prescribe  [name] #[phone] ext [phone]    please advice

## 2021-03-14 NOTE — Progress Notes (Signed)
   Subjective:   Virtual Visit via Video Note  I connected with Kathy Gamble on 03/14/21 at  1:00 PM EDT by a video enabled telemedicine application and verified that I am speaking with the correct person using two identifiers.  Location: Patient: home Provider: office.   I discussed the limitations of evaluation and management by telemedicine and the availability of in person appointments. The patient expressed understanding and agreed to proceed.  Participants- pt and myself.  History of Present Illness:  Pt has rt eye upper lid swelling. She states like size of golf ball. No yellow. Pt does states some ha. This area swollen for 2 weeks.  Pt has tried warm and cold compresses to this area.   Pt when blows nose sees clear mucus.   Observations/Objective: General-no acute distress, pleasant, oriented. Lungs- on inspection lungs appear unlabored. Neck- no tracheal deviation or jvd on inspection. Neuro- gross motor function appears intact. Right upper eyelid on video looks to have circular type of bulge.  Would estimate size of small to large marble.  Conjunctiva looks clear and no discharge present.  However note quality of the video was not draining.  Assessment and Plan: Right upper eyelid versus chalazion.  However on video inspection the area does appear to be about the size of a small to large marble.  Difficult to assess due to grainy appearance of video.  Conjunctival clear no discharge.  Will prescribe Tobrex solution 2 drops every 6-8 hours to right.  Also prescribed Bactrim antibiotic in the event of upper lid infection.  If over the weekend you develop eye pain, enlargement of irritated area or vision changes then recommend urgent care or emergency department evaluation.  Recommend calling your primary care office on Monday and asking if someone in the office could see you on Tuesday.  I will be in the office next week and not sure he could follow-up on this area.  If  area has not improved at all and determined that chalazion present you might need referral to ophthalmologist.  Follow Up Instructions:    I discussed the assessment and treatment plan with the patient. The patient was provided an opportunity to ask questions and all were answered. The patient agreed with the plan and demonstrated an understanding of the instructions.   The patient was advised to call back or seek an in-person evaluation if the symptoms worsen or if the condition fails to improve as anticipated.  Time spent with patient today was 25  minutes which consisted of chart revdiew, discussing diagnosis, work up treatment and documentation.   Esperanza Richters, PA-C   Review of Systems     Objective:   Physical Exam        Assessment & Plan:

## 2021-03-14 NOTE — Telephone Encounter (Signed)
[facility]   [address], [place], [place] [address]

## 2021-04-01 ENCOUNTER — Telehealth (INDEPENDENT_AMBULATORY_CARE_PROVIDER_SITE_OTHER): Payer: Medicare HMO | Admitting: Family Medicine

## 2021-04-01 DIAGNOSIS — R059 Cough, unspecified: Secondary | ICD-10-CM

## 2021-04-01 DIAGNOSIS — R0981 Nasal congestion: Secondary | ICD-10-CM

## 2021-04-01 MED ORDER — BENZONATATE 100 MG PO CAPS: 100.0000 mg | ORAL_CAPSULE | Freq: Three times a day (TID) | ORAL | 0 refills | Status: DC | PRN

## 2021-04-01 NOTE — Progress Notes (Signed)
Virtual Visit via Video Note  I connected with Kathy Gamble  on 04/01/21 at  1:20 PM EDT by a video enabled telemedicine application and verified that I am speaking with the correct person using two identifiers.  Location patient: home, [place] Location provider:work or home office Persons participating in the virtual visit: patient, provider  I discussed the limitations of evaluation and management by telemedicine and the availability of in person appointments. The patient expressed understanding and agreed to proceed.   HPI:  Acute telemedicine visit for sinus issues and cough: -Onset:  today -Symptoms include: nasal congestion, pnd, cough, hoarsness -Denies:fever, bodyaches, wheezing, SOB, CP, NVD, inability to eat/drink/get out of bed -son has been sick as well -Has tried: zyrtec, flonase -Pertinent past medical history:hx of asthma and uses albuterol as needed, also has allergies -Pertinent medication allergies: bee venom, latex -COVID-19 vaccine status: had covid shots x2 + booster -had flu shot  ROS: See pertinent positives and negatives per HPI.  Past Medical History:  Diagnosis Date  . Allergy   . Bee sting allergy 03/02/2015  . Chicken pox   . Frequent headaches   . GERD (gastroesophageal reflux disease)     Past Surgical History:  Procedure Laterality Date  . ADENOIDECTOMY    . TONSILLECTOMY       Current Outpatient Medications:  .  benzonatate (TESSALON PERLES) 100 MG capsule, Take 1 capsule (100 mg total) by mouth 3 (three) times daily as needed., Disp: 20 capsule, Rfl: 0 .  albuterol (VENTOLIN HFA) 108 (90 Base) MCG/ACT inhaler, INHALE ONE OR TWO PUFFS INTO THE LUNGS EVERY 6 HOURS AS NEEDED FOR WHEEZING OR SHORTNESS OF BREATH., Disp: 6.7 each, Rfl: 1 .  docusate sodium (COLACE) 100 MG capsule, Take 1 capsule (100 mg total) by mouth 2 (two) times daily., Disp: 60 capsule, Rfl: 1 .  fluticasone (FLONASE) 50 MCG/ACT nasal spray, PLACE 1 SPRAY INTO BOTH NOSTRILS DAILY.,  Disp: 48 mL, Rfl: 0 .  levocetirizine (XYZAL) 5 MG tablet, TAKE 1 TABLET BY MOUTH EVERY DAY IN THE EVENING, Disp: 90 tablet, Rfl: 0 .  montelukast (SINGULAIR) 10 MG tablet, Take 1 tablet (10 mg total) by mouth at bedtime., Disp: 90 tablet, Rfl: 0 .  Multiple Vitamin (MULTIVITAMIN) capsule, Take 1 capsule by mouth daily., Disp: , Rfl:  .  Omega-3 Fatty Acids (FISH OIL) 1000 MG CAPS, Take 1 capsule by mouth daily. , Disp: , Rfl:  .  omeprazole (PRILOSEC) 20 MG capsule, Take 1 capsule (20 mg total) by mouth daily., Disp: 90 capsule, Rfl: 0 .  sodium chloride (OCEAN) 0.65 % SOLN nasal spray, Place 1 spray into both nostrils as needed for congestion. , Disp: , Rfl:  .  sulfamethoxazole-trimethoprim (BACTRIM DS) 800-160 MG tablet, Take 1 tablet by mouth 2 (two) times daily., Disp: 14 tablet, Rfl: 0 .  tobramycin (TOBREX) 0.3 % ophthalmic solution, Place 2 drops into the right eye every 6 (six) hours., Disp: 5 mL, Rfl: 0 .  topiramate (TOPAMAX) 25 MG tablet, Take 1 tablet (25 mg total) by mouth at bedtime., Disp: 90 tablet, Rfl: 0 .  triamcinolone cream (KENALOG) 0.1 %, APPLY TO AFFECTED AREA TWICE A DAY, Disp: 15 g, Rfl: 0  EXAM:  VITALS per patient if applicable:  GENERAL: alert, oriented, appears well and in no acute distress  HEENT: atraumatic, conjunttiva clear, no obvious abnormalities on inspection of external nose and ears  NECK: normal movements of the head and neck  LUNGS: on inspection no signs of respiratory  distress, breathing rate appears normal, no obvious gross SOB, gasping or wheezing  CV: no obvious cyanosis  MS: moves all visible extremities without noticeable abnormality  PSYCH/NEURO: pleasant and cooperative, no obvious depression or anxiety, speech and thought processing grossly intact  ASSESSMENT AND PLAN:  Discussed the following assessment and plan:  Nasal congestion  Cough  -we discussed possible serious and likely etiologies, options for evaluation and  workup, limitations of telemedicine visit vs in person visit, treatment, treatment risks and precautions. Pt prefers to treat via telemedicine empirically rather than in person at this moment.  Query VURI, [id number], allergies vs other. She opted for Tessalon for cough, continuation of allergy regimen, nasal saline, other symptomatic care measures per patient instructions. Advised covid testing and discussed options. Advised of potential complications and precautions and to schedule follow up if covid testing positive.  Work/School slipped offered: declined Scheduled follow up with PCP offered: follow up as needed. Advised to seek prompt in person care if worsening, new symptoms arise, or if is not improving with treatment. Discussed options for inperson care if PCP office not available. Did let this patient know that I only do telemedicine on Tuesdays and Thursdays for [place]. Advised to schedule follow up visit with PCP or [facility] if any further questions or concerns to avoid delays in care.   I discussed the assessment and treatment plan with the patient. The patient was provided an opportunity to ask questions and all were answered. The patient agreed with the plan and demonstrated an understanding of the instructions.     Terressa Koyanagi, DO

## 2021-04-01 NOTE — Patient Instructions (Addendum)
  HOME CARE TIPS:  -[facility] COVID19 testing information: [email or web address] OR [phone] Most pharmacies also offer testing and home test kits.  -I sent the medication(s) we discussed to your pharmacy: Meds ordered this encounter  Medications  . benzonatate (TESSALON PERLES) 100 MG capsule    Sig: Take 1 capsule (100 mg total) by mouth 3 (three) times daily as needed.    Dispense:  20 capsule    Refill:  0     -I sent a message to the outpatient Covid treatment center letting them know that you are interested in treatment. Currently there are very limited treatment doses available and the are calling the folks that are at the highest risk of hospitalization or severe disease first.   -can use tylenol if needed for fevers, aches and pains per instructions  -can use nasal saline a few times per day if you have nasal congestion  -stay hydrated, drink plenty of fluids and eat small healthy meals - avoid dairy   -can take 1000 IU (25mcg) Vit D3 and 100-500 mg of Vit C daily per instructions  -If the Covid test is positive, check out the CDC website for more information on home care, transmission and treatment for COVID19  -follow up with your doctor in 2-3 days unless improving and feeling better  -stay home while sick, except to seek medical care, and if you have COVID19 ideally it would be best to stay home for a full 10 days since the onset of symptoms PLUS one day of no fever and feeling better. Wear a good mask (such as N95 or KN95) if around others to reduce the risk of transmission.  It was nice to meet you today, and I really hope you are feeling better soon. I help [facility] out with telemedicine visits on Tuesdays and Thursdays and am available for visits on those days. If you have any concerns or questions following this visit please schedule a follow up visit with your Primary Care doctor or seek care at a local urgent care clinic  to avoid delays in care.    Seek in person care or schedule a follow up video visit promptly if your symptoms worsen, new concerns arise or you are not improving with treatment. Call 911 and/or seek emergency care if your symptoms are severe or life threatening.   

## 2021-04-05 ENCOUNTER — Other Ambulatory Visit: Payer: Self-pay | Admitting: Internal Medicine

## 2021-04-15 ENCOUNTER — Encounter: Payer: Self-pay | Admitting: Family Medicine

## 2021-04-15 ENCOUNTER — Other Ambulatory Visit: Payer: Self-pay

## 2021-04-15 ENCOUNTER — Telehealth (INDEPENDENT_AMBULATORY_CARE_PROVIDER_SITE_OTHER): Payer: Medicare HMO | Admitting: Family Medicine

## 2021-04-15 VITALS — Temp 96.7°F | Ht 68.0 in

## 2021-04-15 DIAGNOSIS — J45909 Unspecified asthma, uncomplicated: Secondary | ICD-10-CM | POA: Insufficient documentation

## 2021-04-15 DIAGNOSIS — J309 Allergic rhinitis, unspecified: Secondary | ICD-10-CM | POA: Insufficient documentation

## 2021-04-15 DIAGNOSIS — J301 Allergic rhinitis due to pollen: Secondary | ICD-10-CM | POA: Diagnosis not present

## 2021-04-15 MED ORDER — PREDNISONE 10 MG PO TABS: 10.0000 mg | ORAL_TABLET | Freq: Every day | ORAL | 0 refills | Status: DC

## 2021-04-15 NOTE — Progress Notes (Signed)
VIRTUAL VISIT Due to national recommendations of social distancing due to COVID 19, a virtual visit is felt to be most appropriate for this patient at this time.   I connected with the patient on 04/15/21 at 10:40 AM EDT by virtual telehealth platform and verified that I am speaking with the correct person using two identifiers.   I discussed the limitations, risks, security and privacy concerns of performing an evaluation and management service by  virtual telehealth platform and the availability of in person appointments. I also discussed with the patient that there may be a patient responsible charge related to this service. The patient expressed understanding and agreed to proceed.  Patient location: Home Provider Location: [facility] Participants: Kerby Nora and [name]   Chief Complaint  Patient presents with  . Sneezing  . Tickle in throat  . Cough    History of Present Illness:  45 year old female with history of allergic rhinitis present with new onset sneezing, tickle in throat and cough.  Onset of symptoms: 2 weeks ago. Has cough, congestion, tickle in throat. She is using benzonatate for cough.. not helping much.N o wheeze, mild SOB when outside with dog. ALbuterol helped some. She also is on Xyzal, flonase, singulair   No fever  Headache in morning.. using tylenol.   Reviewed OV from Dr. Selena Batten on 4/19  COVID 19 screen COVID testing:None COVID vaccine: 04/12/2020 , 03/21/2020 COVID exposure: No recent travel or known exposure to COVID19  The importance of social distancing was discussed today.    Review of Systems  Constitutional: Negative for chills and fever.  HENT: Positive for congestion. Negative for ear pain.   Eyes: Negative for pain and redness.  Respiratory: Negative for cough and shortness of breath.   Cardiovascular: Negative for chest pain, palpitations and leg swelling.  Gastrointestinal: Negative for abdominal pain, blood in stool,  constipation, diarrhea, nausea and vomiting.  Genitourinary: Negative for dysuria.  Musculoskeletal: Negative for falls and myalgias.  Skin: Negative for rash.  Neurological: Negative for dizziness.  Psychiatric/Behavioral: Negative for depression. The patient is not nervous/anxious.       Past Medical History:  Diagnosis Date  . Allergy   . Bee sting allergy 03/02/2015  . Chicken pox   . Frequent headaches   . GERD (gastroesophageal reflux disease)     reports that she has never smoked. She has never used smokeless tobacco. She reports current drug use. Drug: Cocaine. She reports that she does not drink alcohol.   Current Outpatient Medications:  .  albuterol (VENTOLIN HFA) 108 (90 Base) MCG/ACT inhaler, INHALE ONE OR TWO PUFFS INTO THE LUNGS EVERY 6 HOURS AS NEEDED FOR WHEEZING OR SHORTNESS OF BREATH., Disp: 6.7 each, Rfl: 1 .  benzonatate (TESSALON PERLES) 100 MG capsule, Take 1 capsule (100 mg total) by mouth 3 (three) times daily as needed., Disp: 20 capsule, Rfl: 0 .  fluticasone (FLONASE) 50 MCG/ACT nasal spray, PLACE 1 SPRAY INTO BOTH NOSTRILS DAILY., Disp: 48 mL, Rfl: 0 .  levocetirizine (XYZAL) 5 MG tablet, TAKE 1 TABLET BY MOUTH EVERY DAY IN THE EVENING, Disp: 90 tablet, Rfl: 0 .  Multiple Vitamin (MULTIVITAMIN) capsule, Take 1 capsule by mouth daily., Disp: , Rfl:  .  tobramycin (TOBREX) 0.3 % ophthalmic solution, Place 2 drops into the right eye every 6 (six) hours., Disp: 5 mL, Rfl: 0 .  topiramate (TOPAMAX) 25 MG tablet, Take 1 tablet (25 mg total) by mouth at bedtime., Disp: 90 tablet,  Rfl: 0 .  montelukast (SINGULAIR) 10 MG tablet, TAKE 1 TABLET BY MOUTH DAILY FOR SINUSES. (Patient not taking: Reported on 04/15/2021), Disp: 90 tablet, Rfl: 0 .  sodium chloride (OCEAN) 0.65 % SOLN nasal spray, Place 1 spray into both nostrils as needed for congestion.  (Patient not taking: Reported on 04/15/2021), Disp: , Rfl:    Observations/Objective: Temperature (!) 96.7 F (35.9 C),  temperature source Temporal, height 5\' 8"  (1.727 m), last menstrual period 04/08/2021.  Physical Exam   Assessment and Plan Problem List Items Addressed This Visit    Allergic sinusitis - Primary     NO S?S of viral or bacterial infection. Wear a mask when outside in pollen.  Start prednisone  Stop afrin, saline mist to irrigate sinuses.  Continue all other allergy meds.         Relevant Medications   predniSONE (DELTASONE) 10 MG tablet   Seasonal allergic rhinitis due to pollen        I discussed the assessment and treatment plan with the patient. The patient was provided an opportunity to ask questions and all were answered. The patient agreed with the plan and demonstrated an understanding of the instructions.   The patient was advised to call back or seek an in-person evaluation if the symptoms worsen or if the condition fails to improve as anticipated.     04/10/2021, MD

## 2021-04-15 NOTE — Patient Instructions (Addendum)
Wear a mask when outside in pollen.  Start prednisone  Stop afrin, saline mist to irrigate sinuses.  Continue all other allergy meds.

## 2021-04-15 NOTE — Assessment & Plan Note (Signed)
NO S?S of viral or bacterial infection. Wear a mask when outside in pollen.  Start prednisone  Stop afrin, saline mist to irrigate sinuses.  Continue all other allergy meds.

## 2021-04-21 ENCOUNTER — Telehealth: Payer: Self-pay | Admitting: Internal Medicine

## 2021-04-21 NOTE — Telephone Encounter (Signed)
She should make an appt with someone in the office to discuss migraine treatment options.

## 2021-04-21 NOTE — Telephone Encounter (Signed)
Pt called in wanted to know if she can get switched to Nurtec. Its to help for her headaches. She stated that is not allergic any medication.

## 2021-04-22 NOTE — Telephone Encounter (Signed)
Cindy,  Please call and schedule patient with any provider to discuss migraine treatment options per Dr. Ermalene Searing.

## 2021-04-22 NOTE — Telephone Encounter (Signed)
Left voice message to call the office  

## 2021-04-22 NOTE — Telephone Encounter (Signed)
Patient called in . Appointment was scheduled

## 2021-04-28 ENCOUNTER — Ambulatory Visit: Payer: Medicare HMO | Admitting: Family Medicine

## 2021-04-30 ENCOUNTER — Telehealth (INDEPENDENT_AMBULATORY_CARE_PROVIDER_SITE_OTHER): Payer: Medicare HMO | Admitting: Family Medicine

## 2021-04-30 ENCOUNTER — Encounter: Payer: Self-pay | Admitting: Family Medicine

## 2021-04-30 ENCOUNTER — Other Ambulatory Visit: Payer: Self-pay

## 2021-04-30 VITALS — Temp 97.3°F | Ht 68.0 in

## 2021-04-30 DIAGNOSIS — G43011 Migraine without aura, intractable, with status migrainosus: Secondary | ICD-10-CM

## 2021-04-30 MED ORDER — NURTEC 75 MG PO TBDP: ORAL_TABLET | ORAL | 2 refills | Status: DC

## 2021-04-30 NOTE — Progress Notes (Signed)
      Kathy Gamble T. Kathy Strollo, MD Primary Care and Sports Medicine [facility] [address] [place], [address] Phone: [phone]  FAX: [phone]  Kathy Gamble - 45 y.o. female  MRN [id number]  Date of Birth: February 13, 1976  Visit Date: 04/30/2021  PCP: Lorre Munroe, NP  Referred by: Lorre Munroe, NP  Virtual Visit via Video Note:  I connected with  Kathy Gamble on 04/30/2021  8:40 AM EDT by a video enabled telemedicine application and verified that I am speaking with the correct person using two identifiers.   Location patient: home computer, tablet, or smartphone Location provider: work or home office Consent: Verbal consent directly obtained from Kathy Gamble. Persons participating in the virtual visit: patient, provider  I discussed the limitations of evaluation and management by telemedicine and the availability of in person appointments. The patient expressed understanding and agreed to proceed.  Chief Complaint  Patient presents with  . Headache  . itching    History of Present Illness:  I recall this patient well, and has seen her a number of times over the years as well as her husband. Dominant complaint today is headaches that have been going off and on for 2 weeks, she does also have frequent headaches.  She has problems with light and sound generally as well as nausea.  When she goes into a dark room it does make her symptoms feel much better.  She has tried Tylenol, Aleve, as well as ibuprofen, and these do help for a while. She does not look she is ever tried a triptan. She has tried Topamax, but she is out of this, and it did not give her full relief of symptoms  Review of Systems as above: See pertinent positives and pertinent negatives per HPI No acute distress verbally   Observations/Objective/Exam:  An attempt was made to discern vital signs over the phone and per patient if applicable and possible.    General:    Alert, Oriented, appears well and in no acute distress  Pulmonary:     On inspection no signs of respiratory distress.  Psych / Neurological:     Pleasant and cooperative.  Assessment and Plan:    ICD-10-CM   1. Intractable migraine without aura and with status migrainosus  G43.011 Rimegepant Sulfate (NURTEC) 75 MG TBDP   Classical migraine by history.  Poor control with acute status migrainous.  I think for both a acute abortive medication as well as prophylaxis that his trial of Nurtec would be helpful.  She has failed Topamax as a prophylactic medication.  I discussed the assessment and treatment plan with the patient. The patient was provided an opportunity to ask questions and all were answered. The patient agreed with the plan and demonstrated an understanding of the instructions.   The patient was advised to call back or seek an in-person evaluation if the symptoms worsen or if the condition fails to improve as anticipated.  Follow-up: prn unless noted otherwise below No follow-ups on file.  Meds ordered this encounter  Medications  . Rimegepant Sulfate (NURTEC) 75 MG TBDP    Sig: 1 tablet po every other day for migraine prophylaxis    Dispense:  15 tablet    Refill:  2   No orders of the defined types were placed in this encounter.   Signed,  Elpidio Galea. Davi Kroon, MD

## 2021-05-02 ENCOUNTER — Telehealth: Payer: Self-pay | Admitting: Internal Medicine

## 2021-05-02 ENCOUNTER — Telehealth: Payer: Self-pay

## 2021-05-02 MED ORDER — PREDNISONE 20 MG PO TABS: ORAL_TABLET | ORAL | 0 refills | Status: DC

## 2021-05-02 NOTE — Telephone Encounter (Signed)
Called and spoke to patient and was advised that she has not received the medication prescribed to her by Dr. Patsy Lager. Advised patient that script was sent to [facility].  Patient wanted to know why it has not been delivered to her. Advised patient that she should reach out to [facility] and check with them regarding delivery of her medication and she verbalized understanding.  Patient stated that she has poison ivy on her forearm and chest that she noticed a couple of days ago. Patient stated that she has been using cortisone cream but it has not helped much. Patient was advised if the cream is not helping and  the blisters are spreading she may need a face to face evaluation. Patient stated that she does not drive and her husband does not either. Patient stated that she may be able to get her dad to take her to an UC since we do not have any appointments available here at the office today. Patient was given information on the [facility] in [place]. Patient wants to know if she can't get to the UC what else can she do for the poison ivy. Pharmacy [facility]

## 2021-05-02 NOTE — Telephone Encounter (Signed)
[name] called in and wanted to know about getting nurtec covered and how to get a PA. And wanted to know about if its something she can take that's not expensive she has Medicare.

## 2021-05-02 NOTE — Telephone Encounter (Signed)
[facility] Day - Client TELEPHONE ADVICE RECORD [facility] Patient Name: Kathy Gamble Gender: Female DOB: August 25, 1976 Age: 45 Y 5 M 20 D Return Phone Number: [phone] (Primary), [phone] (Secondary) Address: [place]/ [place] [address] Client [facility] Day - Client Client Site [facility] - Day Physician Nicki Reaper- NP Contact Type Call Who Is Calling Patient / Member / Family / Caregiver Call Type Triage / Clinical Relationship To Patient Self Return Phone Number [phone] (Primary) Chief Complaint Headache Reason for Call Symptomatic / Request for Health Information Initial Comment [name] has poison ivy and wanted to know what to do. She states her hands now have blisters. [name] also states provider was supposed to call in Rx to pharmacy for headaches. She had a virtual call with provider. Translation No Nurse Assessment Nurse: Thurmond Butts, RN, Meriam Sprague Date/Time (Eastern Time): 05/02/2021 10:37:53 AM Confirm and document reason for call. If symptomatic, describe symptoms. ---[name] states she is having a headache and saw the Dr Dallas Schimke on a virtual visit yesterday and he said he would call in something for a headache, and nothing was called in. She got some poison ivy on her forearm. She noticed the rash on her left forearm and a little on her chest 2 days ago. It is blistered like bubbles. It is itchy. Has been using cortisone cream and not helping much. Does the patient have any new or worsening symptoms? ---Yes Will a triage be completed? ---Yes Related visit to physician within the last 2 weeks? ---Yes Does the PT have any chronic conditions? (i.e. diabetes, asthma, this includes High risk factors for pregnancy, etc.) ---Yes List chronic conditions. ---asthma Is the patient pregnant or possibly pregnant? (Ask all females between the ages of 18-55) ---No Is this a  behavioral health or substance abuse call? ---No PLEASE NOTE: All timestamps contained within this report are represented as [place] Standard Time. CONFIDENTIALTY NOTICE: This fax transmission is intended only for the addressee. It contains information that is legally privileged, confidential or otherwise protected from use or disclosure. If you are not the intended recipient, you are strictly prohibited from reviewing, disclosing, copying using or disseminating any of this information or taking any action in reliance on or regarding this information. If you have received this fax in error, please notify us immediately by telephone so that we can arrange for its return to [place]. Phone: [phone], Toll-Free: [phone], Fax: [phone] Page: 2 of 2 Call Id: [id number] Guidelines Guideline Title Affirmed Question Affirmed Notes Nurse Date/Time Lamount Cohen Time) Poison Ivy - [place] - [place] MODERATE to SEVERE itching (e.g., interferes with work, school, sleep, or other activities) Thurmond Butts, Charity fundraiser, [facility] 05/02/2021 10:42:38 AM Disp. Time Lamount Cohen Time) Disposition Final User 05/02/2021 10:23:59 AM Attempt made - message left Ashley Royalty 05/02/2021 10:47:02 AM See PCP within 24 Hours Yes Thurmond Butts, RN, Diamantina [facility] Disagree/Comply Comply [name] Understands Yes PreDisposition Call Doctor Care Advice Given Per Guideline SEE PCP WITHIN 24 HOURS: * IF OFFICE WILL BE OPEN: You need to be examined within the next 24 hours. Call your doctor (or NP/PA) when the office opens and make an appointment. CALL BACK IF: * You become worse CARE ADVICE given per Poison Ivy - [place] - [place] (Adult) guideline. Comments User: Bradly Chris, RN Date/Time Lamount Cohen Time): 05/02/2021 10:35:08 AM Primary number says is no longer in service. It was verified as correct by the back up lead PC. User: Bradly Chris, RN Date/Time Lamount Cohen Time):  05/02/2021 10:44:48 AM small red area on her chest is above her breast. User:  Bradly Chris, RN Date/Time Lamount Cohen Time): 05/02/2021 10:50:46 AM Pt states her headache med should be called to [facility] and she has not received it. Also requests and albuterol inhaler refill and her son Kathy Gamble's med that was promised him. Referrals REFERRED TO PCP OFFICE

## 2021-05-02 NOTE — Telephone Encounter (Signed)
I sent her in some prednisone for her poison ivy.  That is fine. If she does not improve though in a couple of days, someone needs to look at it.    Meds ordered this encounter  Medications  . predniSONE (DELTASONE) 20 MG tablet    Sig: 2 tabs po daily for 5 days, then 1 tab po daily for 5 days    Dispense:  15 tablet    Refill:  0

## 2021-05-05 ENCOUNTER — Telehealth (INDEPENDENT_AMBULATORY_CARE_PROVIDER_SITE_OTHER): Payer: Medicare HMO | Admitting: Family Medicine

## 2021-05-05 ENCOUNTER — Other Ambulatory Visit: Payer: Self-pay

## 2021-05-05 ENCOUNTER — Encounter: Payer: Self-pay | Admitting: Family Medicine

## 2021-05-05 DIAGNOSIS — G43109 Migraine with aura, not intractable, without status migrainosus: Secondary | ICD-10-CM

## 2021-05-05 DIAGNOSIS — L237 Allergic contact dermatitis due to plants, except food: Secondary | ICD-10-CM | POA: Insufficient documentation

## 2021-05-05 DIAGNOSIS — G43E09 Chronic migraine with aura, not intractable, without status migrainosus: Secondary | ICD-10-CM | POA: Insufficient documentation

## 2021-05-05 MED ORDER — PREDNISONE 20 MG PO TABS: ORAL_TABLET | ORAL | 0 refills | Status: DC

## 2021-05-05 MED ORDER — TOPIRAMATE 25 MG PO TABS
25.0000 mg | ORAL_TABLET | Freq: Two times a day (BID) | ORAL | 0 refills | Status: DC
Start: 2021-05-05 — End: 2021-07-25

## 2021-05-05 NOTE — Assessment & Plan Note (Signed)
Notes daily HA. Nurtec too expensive so she never started this. Was only on 25 mg of topiramate so discussed increasing to 25 mg BID and f/u in-person visit in 1 month.

## 2021-05-05 NOTE — Progress Notes (Signed)
    I connected with Kathy Gamble on 05/05/21 at 10:00 AM EDT by video and verified that I am speaking with the correct person using two identifiers.   I discussed the limitations, risks, security and privacy concerns of performing an evaluation and management service by video and the availability of in person appointments. I also discussed with the patient that there may be a patient responsible charge related to this service. The patient expressed understanding and agreed to proceed.  Patient location: Home Provider Location: [place] Participants: Lynnda Child and MERL GUARDINO and Jadie Allington    Subjective:     Kathy Gamble is a 45 y.o. female presenting for Headache and Medication Refill (topamax)     HPI   #poison ivy - didn't realized that she had a prescription for prednisone - still has rash  #migraines - with aura - was taking topamax - is out of this medication - endorses migraines every day - did improve with topamax - was also prescribed Nurtec last week - did not start this medication. Reports she did not receive this medication from the pharmacy - was told that this medication was not covered   Review of Systems   Social History   Tobacco Use  Smoking Status Never Smoker  Smokeless Tobacco Never Used        Objective:   BP Readings from Last 3 Encounters:  11/22/20 130/90  08/05/20 135/80  07/18/20 124/90   Wt Readings from Last 3 Encounters:  03/14/21 291 lb (132 kg)  11/22/20 245 lb (111.1 kg)  08/05/20 225 lb (102.1 kg)    LMP 04/08/2021 (Approximate)   Physical Exam Constitutional:      Appearance: Normal appearance. She is not ill-appearing.  HENT:     Head: Normocephalic and atraumatic.     Right Ear: External ear normal.     Left Ear: External ear normal.  Eyes:     Conjunctiva/sclera: Conjunctivae normal.  Pulmonary:     Effort: Pulmonary effort is normal. No respiratory distress.  Neurological:     Mental  Status: She is alert. Mental status is at baseline.  Psychiatric:        Mood and Affect: Mood normal.        Behavior: Behavior normal.        Thought Content: Thought content normal.        Judgment: Judgment normal.          Assessment & Plan:   Problem List Items Addressed This Visit      Cardiovascular and Mediastinum   Chronic migraine with aura - Primary    Notes daily HA. Nurtec too expensive so she never started this. Was only on 25 mg of topiramate so discussed increasing to 25 mg BID and f/u in-person visit in 1 month.       Relevant Medications   predniSONE (DELTASONE) 20 MG tablet   topiramate (TOPAMAX) 25 MG tablet     Musculoskeletal and Integument   Poison ivy dermatitis    Miscommunication about prednisone prescribed next week and never started. Will send to new pharmacy. Recommend OTC benadryl and hydrocortisone cream prn.       Relevant Medications   predniSONE (DELTASONE) 20 MG tablet       Return in about 4 weeks (around 06/02/2021) for in-person for migraines.  Lynnda Child, MD

## 2021-05-05 NOTE — Assessment & Plan Note (Signed)
Miscommunication about prednisone prescribed next week and never started. Will send to new pharmacy. Recommend OTC benadryl and hydrocortisone cream prn.

## 2021-05-05 NOTE — Telephone Encounter (Signed)
Discussed with patient during our visit today.   She will try higher dose Topamax

## 2021-05-07 ENCOUNTER — Ambulatory Visit (INDEPENDENT_AMBULATORY_CARE_PROVIDER_SITE_OTHER): Payer: Medicare HMO | Admitting: Family Medicine

## 2021-05-07 ENCOUNTER — Other Ambulatory Visit: Payer: Self-pay

## 2021-05-07 ENCOUNTER — Encounter: Payer: Self-pay | Admitting: Family Medicine

## 2021-05-07 VITALS — Temp 96.9°F | Ht 68.0 in

## 2021-05-07 DIAGNOSIS — R059 Cough, unspecified: Secondary | ICD-10-CM

## 2021-05-07 DIAGNOSIS — H9203 Otalgia, bilateral: Secondary | ICD-10-CM | POA: Diagnosis not present

## 2021-05-07 MED ORDER — AMOXICILLIN 875 MG PO TABS: 875.0000 mg | ORAL_TABLET | Freq: Two times a day (BID) | ORAL | 0 refills | Status: AC

## 2021-05-07 NOTE — Progress Notes (Signed)
      Devarion Mcclanahan T. Yesly Gerety, MD Primary Care and Sports Medicine [facility] at [facility] [address] [place], [address] Phone: [phone]  FAX: [phone]  Kathy Gamble - 45 y.o. female  MRN [id number]  Date of Birth: 04-21-1976  Visit Date: 05/07/2021  PCP: Lorre Munroe, NP  Referred by: Lorre Munroe, NP  Virtual Visit via Video Note:  I connected with  Kathy Gamble on 05/07/2021  9:00 AM EDT by a video enabled telemedicine application and verified that I am speaking with the correct person using two identifiers.   Location patient: home computer, tablet, or smartphone Location provider: work or home office Consent: Verbal consent directly obtained from Kathy Gamble. Persons participating in the virtual visit: patient, provider  I discussed the limitations of evaluation and management by telemedicine and the availability of in person appointments. The patient expressed understanding and agreed to proceed.  Chief Complaint  Patient presents with  . Cough  . Ear Pain    Bilateral Pressure    History of Present Illness:  Cough and B ear pain. Virtual.  She spoke to Dr. Selena Batten about poison ivy a couple of days ago, and she is on Prednisone.   Yesterday, having some ear pain.  Cough.  Rhinnorhea.  Feels stopped up and has some pressure.  Sinus pain.   ST and HA.  No n/v, normal BM. No fever.   Denies leaving at all - delivery of groceries.   ? OM Started last week, worsening.  Review of Systems as above: See pertinent positives and pertinent negatives per HPI No acute distress verbally   Observations/Objective/Exam:  An attempt was made to discern vital signs over the phone and per patient if applicable and possible.   General:    Alert, Oriented, appears well and in no acute distress  Pulmonary:     On inspection no signs of respiratory distress.  Psych / Neurological:     Pleasant and cooperative.  Assessment and  Plan:    ICD-10-CM   1. Ear pain, bilateral  H92.03   2. Cough  R05.9    Coughing with ear pain and isolated greater than a week.  Which certainly could be a virus, but by history it sounds like she initially had a virus and then progressed to ear infection.  She has had multiple episodes of ear infections in the past.  COVID-19 is possible, but she is past the quarantine period, and she will wear a mask if she leaves the home.  I discussed the assessment and treatment plan with the patient. The patient was provided an opportunity to ask questions and all were answered. The patient agreed with the plan and demonstrated an understanding of the instructions.   The patient was advised to call back or seek an in-person evaluation if the symptoms worsen or if the condition fails to improve as anticipated.  Follow-up: prn unless noted otherwise below No follow-ups on file.  Meds ordered this encounter  Medications  . amoxicillin (AMOXIL) 875 MG tablet    Sig: Take 1 tablet (875 mg total) by mouth 2 (two) times daily for 10 days.    Dispense:  20 tablet    Refill:  0   No orders of the defined types were placed in this encounter.   Signed,  Elpidio Galea. Brookelynne Dimperio, MD

## 2021-05-28 ENCOUNTER — Other Ambulatory Visit: Payer: Self-pay | Admitting: Internal Medicine

## 2021-05-28 DIAGNOSIS — J302 Other seasonal allergic rhinitis: Secondary | ICD-10-CM

## 2021-06-23 ENCOUNTER — Other Ambulatory Visit: Payer: Self-pay | Admitting: Internal Medicine

## 2021-06-23 DIAGNOSIS — J302 Other seasonal allergic rhinitis: Secondary | ICD-10-CM

## 2021-06-25 ENCOUNTER — Other Ambulatory Visit: Payer: Self-pay | Admitting: Internal Medicine

## 2021-06-25 DIAGNOSIS — J302 Other seasonal allergic rhinitis: Secondary | ICD-10-CM

## 2021-06-26 ENCOUNTER — Other Ambulatory Visit: Payer: Self-pay | Admitting: Family Medicine

## 2021-06-26 ENCOUNTER — Telehealth: Payer: Self-pay

## 2021-06-26 DIAGNOSIS — J302 Other seasonal allergic rhinitis: Secondary | ICD-10-CM

## 2021-06-26 MED ORDER — OMEPRAZOLE 20 MG PO CPDR: 20.0000 mg | DELAYED_RELEASE_CAPSULE | Freq: Every day | ORAL | 0 refills | Status: DC

## 2021-06-26 MED ORDER — FLUTICASONE PROPIONATE 50 MCG/ACT NA SUSP: 1.0000 | Freq: Every day | NASAL | 0 refills | Status: DC

## 2021-06-26 MED ORDER — MONTELUKAST SODIUM 10 MG PO TABS: ORAL_TABLET | ORAL | 0 refills | Status: DC

## 2021-06-26 NOTE — Addendum Note (Signed)
Addended by: Damita Lack on: 06/26/2021 11:21 AM   Modules accepted: Orders

## 2021-06-26 NOTE — Telephone Encounter (Signed)
Kathy Gamble, from [facility], called to request refills for the patient from Dr Ermalene Searing. Per Kathy Gamble patient said Dr Ermalene Searing is her provider now. I did not see that PCP was changed in the chart and wanted to confirm. Requesting Flonase and Singulair and Omeprazole 20 mg 1 capsule once daily.  I told [name] that we denied this refill request because it was not in her med list anymore but per [facility] patient said she is on this medication. Please review.

## 2021-06-26 NOTE — Telephone Encounter (Signed)
She has not had an TOC visit with me... I do see her husband, but I have added a lot of new patients lately and would like her to establish with one of the new providers coming in.   I did refill her medication.

## 2021-06-26 NOTE — Telephone Encounter (Signed)
They are also requesting refill on omeprazole 20 mg but is not on current medication list but patient states she is taking.  Ok to refill?  Sign order below if okay.    Will send message to support pool to help Meredyth get established with [place] or [place].

## 2021-06-26 NOTE — Telephone Encounter (Addendum)
Pt prefer to stay with [facility] because pt or husband is not driving at this time. Pt was trying to set up transportation services with medicaid transportation and they was going to pick her up to take her to the doctor, but  they did not know the pt.

## 2021-06-26 NOTE — Telephone Encounter (Signed)
She still needs TOC appt... even if she stayed with me as she has not established with ME ( even if she thinks she has)...  make a VIRTUAL TOC appt with Matt or Tabitha.

## 2021-06-27 NOTE — Telephone Encounter (Signed)
I called and spoke with patient. I explained that Dr. Ermalene Searing is not currently taking new patients and the appointment she had with her was for an acute concern, not to transfer her care. I scheduled her for the end of August to transfer to [facility]. I explained to her that this will give her plenty of time to coordinate transportation and if anything changes or the day doesn't work for transportation, to give [place] a call to get it rescheduled. Pt verbalized understanding.

## 2021-07-11 ENCOUNTER — Other Ambulatory Visit: Payer: Self-pay | Admitting: Family

## 2021-07-11 DIAGNOSIS — J302 Other seasonal allergic rhinitis: Secondary | ICD-10-CM

## 2021-07-19 DIAGNOSIS — H52223 Regular astigmatism, bilateral: Secondary | ICD-10-CM | POA: Diagnosis not present

## 2021-07-19 DIAGNOSIS — Z0100 Encounter for examination of eyes and vision without abnormal findings: Secondary | ICD-10-CM | POA: Diagnosis not present

## 2021-07-21 ENCOUNTER — Encounter: Payer: Self-pay | Admitting: Nurse Practitioner

## 2021-07-21 ENCOUNTER — Other Ambulatory Visit: Payer: Self-pay

## 2021-07-21 ENCOUNTER — Ambulatory Visit (INDEPENDENT_AMBULATORY_CARE_PROVIDER_SITE_OTHER): Payer: Medicare HMO | Admitting: Nurse Practitioner

## 2021-07-21 VITALS — BP 110/76 | HR 76 | Temp 97.1°F | Resp 14 | Ht 67.5 in | Wt 255.2 lb

## 2021-07-21 DIAGNOSIS — J301 Allergic rhinitis due to pollen: Secondary | ICD-10-CM | POA: Diagnosis not present

## 2021-07-21 DIAGNOSIS — R609 Edema, unspecified: Secondary | ICD-10-CM | POA: Diagnosis not present

## 2021-07-21 DIAGNOSIS — G43109 Migraine with aura, not intractable, without status migrainosus: Secondary | ICD-10-CM | POA: Diagnosis not present

## 2021-07-21 DIAGNOSIS — K219 Gastro-esophageal reflux disease without esophagitis: Secondary | ICD-10-CM | POA: Diagnosis not present

## 2021-07-21 DIAGNOSIS — F09 Unspecified mental disorder due to known physiological condition: Secondary | ICD-10-CM | POA: Diagnosis not present

## 2021-07-21 NOTE — Progress Notes (Signed)
Established Patient Office Visit  Subjective:  Patient ID: Kathy Gamble, female    DOB: 1976-04-20  Age: 45 y.o. MRN: [id number]  CC:  Chief Complaint  Patient presents with   Concentration issues    For years and was not addressed in the past-would like to discuss ADHD medication.   Headache    Topamax has helped headaches but they have not resolved.    HPI Kathy Gamble presents for ADHD and headache  Attention issue: States that the patient is having attention issues for approx 15 years and states that it is worsening. Has some distractibility approx half of the time. States that she was "Slow" while in school. Has not had any neurocognitive testing. States in the sixth grade they found out she was "slow". Patient is able to do task sometimes does not finish always. Patient states that sometimes she needs to do something but unsure what it is. No acute changes. Mother is at bedside and providing 75 % of the history. Per mother and per patients spouse (who is not present) it is getting worse.  Headaches: states that headaches have been improving since having the topamax has been increased. She is unsure of how long she has been taking the topamax twice daily. Does use OTC treatments for abortive therapy that help sometimes. When asked how often patient is not able to give me definitive dates or times a week, she says "sometimes". Has tried tylenol, ibuprofen, and aleve.  GERD: maintained on omeprazole. Continue  Allergies: maintained on levocertirizine and montelukast. Does have an albuterol inhaler if she wheezes or feels short of breath states maybe uses it once a week if that.    Past Medical History:  Diagnosis Date   Allergy    Bee sting allergy 03/02/2015   Chicken pox    Frequent headaches    GERD (gastroesophageal reflux disease)     Past Surgical History:  Procedure Laterality Date   ADENOIDECTOMY     TONSILLECTOMY      Family History  Problem Relation Age of  Onset   Stroke Mother    Mental retardation Mother    Cancer Maternal Grandmother    Cancer Paternal Grandmother        Breast   Heart disease Paternal Grandfather     Social History   Socioeconomic History   Marital status: Married    Spouse name: Not on file   Number of children: Not on file   Years of education: Not on file   Highest education level: Not on file  Occupational History   Not on file  Tobacco Use   Smoking status: Never   Smokeless tobacco: Never  Substance and Sexual Activity   Alcohol use: No    Alcohol/week: 0.0 standard drinks   Drug use: Yes    Types: Cocaine   Sexual activity: Yes    Birth control/protection: None  Other Topics Concern   Not on file  Social History Narrative   Not on file   Social Determinants of Health   Financial Resource Strain: Not on file  Food Insecurity: Not on file  Transportation Needs: Not on file  Physical Activity: Not on file  Stress: Not on file  Social Connections: Not on file  Intimate Partner Violence: Not on file    Outpatient Medications Prior to Visit  Medication Sig Dispense Refill   albuterol (VENTOLIN HFA) 108 (90 Base) MCG/ACT inhaler INHALE 1-2 PUFFS into THE lungs EVERY 6 HOURS AS  NEEDED FOR SHORTNESS OF BREATH OR wheezing 8.5 g 1   fluticasone (FLONASE) 50 MCG/ACT nasal spray Place 1 spray into both nostrils daily. 48 mL 0   levocetirizine (XYZAL) 5 MG tablet TAKE 1 TABLET BY MOUTH EVERY DAY IN THE EVENING 90 tablet 0   montelukast (SINGULAIR) 10 MG tablet TAKE 1 TABLET BY MOUTH DAILY FOR SINUSES. 90 tablet 0   Multiple Vitamin (MULTIVITAMIN) capsule Take 1 capsule by mouth daily.     omeprazole (PRILOSEC) 20 MG capsule Take 1 capsule (20 mg total) by mouth daily. 90 capsule 0   tobramycin (TOBREX) 0.3 % ophthalmic solution Place 2 drops into the right eye every 6 (six) hours. 5 mL 0   topiramate (TOPAMAX) 25 MG tablet Take 1 tablet (25 mg total) by mouth 2 (two) times daily. 180 tablet 0    predniSONE (DELTASONE) 20 MG tablet 2 tabs po daily for 5 days, then 1 tab po daily for 5 days 15 tablet 0   sodium chloride (OCEAN) 0.65 % SOLN nasal spray Place 1 spray into both nostrils as needed for congestion.     No facility-administered medications prior to visit.    Allergies  Allergen Reactions   Bee Venom Itching   Latex Rash    The powder in the gloves causes rash    ROS Review of Systems  Constitutional:  Positive for fatigue. Negative for chills and fever.  Eyes:  Positive for visual disturbance (requires glasses that she lost and has been wearing her spouses glasses. Did go to the eye doctor and got a new script waiting for them to arrive.).  Cardiovascular:  Positive for leg swelling.  Gastrointestinal:  Negative for diarrhea, nausea and vomiting.  Skin:  Negative for color change and pallor.  Neurological:  Positive for numbness and headaches. Negative for speech difficulty and weakness.  Psychiatric/Behavioral:  Positive for confusion and decreased concentration. Negative for sleep disturbance and suicidal ideas. The patient is not nervous/anxious.      Objective:    Physical Exam Vitals and nursing note reviewed.  Constitutional:      Appearance: She is obese.  HENT:     Head: Normocephalic.  Eyes:     Extraocular Movements: Extraocular movements intact.     Pupils: Pupils are equal, round, and reactive to light.  Cardiovascular:     Rate and Rhythm: Normal rate and regular rhythm.     Pulses: Normal pulses.     Heart sounds: Normal heart sounds.  Pulmonary:     Effort: Pulmonary effort is normal.     Breath sounds: Normal breath sounds.  Abdominal:     General: Bowel sounds are normal.  Musculoskeletal:     Right lower leg: Edema (non pitting) present.     Left lower leg: Edema (non pitting) present.     Comments: 5/5 bilateral upper and lower extremity strength  Lymphadenopathy:     Cervical: No cervical adenopathy.  Skin:    General: Skin is  warm.  Neurological:     Mental Status: She is alert and oriented to person, place, and time. Mental status is at baseline.     Cranial Nerves: No cranial nerve deficit or facial asymmetry.     Sensory: No sensory deficit.     Motor: No weakness.     Coordination: Coordination normal. Finger-Nose-Finger Test normal.     Deep Tendon Reflexes: Reflexes normal.     Reflex Scores:      Bicep reflexes are 2+ on  the right side and 2+ on the left side.      Patellar reflexes are 2+ on the right side and 2+ on the left side. Psychiatric:        Mood and Affect: Mood normal. Affect is flat.        Behavior: Behavior normal. Behavior is cooperative.        Thought Content: Thought content normal.        Cognition and Memory: Cognition is impaired.        Judgment: Judgment normal.    BP 110/76   Pulse 76   Temp (!) 97.1 F (36.2 C)   Resp 14   Ht 5' 7.5" (1.715 m)   Wt 255 lb 4 oz (115.8 kg)   LMP 06/24/2021 (Approximate)   SpO2 96%   BMI 39.39 kg/m  Wt Readings from Last 3 Encounters:  07/21/21 255 lb 4 oz (115.8 kg)  03/14/21 291 lb (132 kg)  11/22/20 245 lb (111.1 kg)     Health Maintenance Due  Topic Date Due   HIV Screening  Never done   Hepatitis C Screening  Never done   PAP SMEAR-Modifier  01/02/2018   INFLUENZA VACCINE  07/14/2021    There are no preventive care reminders to display for this patient.  Lab Results  Component Value Date   TSH 1.40 09/10/2015   Lab Results  Component Value Date   WBC 6.6 06/30/2017   HGB 13.0 06/30/2017   HCT 38.5 06/30/2017   MCV 91.7 06/30/2017   PLT 257.0 06/30/2017   Lab Results  Component Value Date   NA 139 06/30/2017   K 3.7 06/30/2017   CO2 25 06/30/2017   GLUCOSE 82 06/30/2017   BUN 23 06/30/2017   CREATININE 0.75 06/30/2017   BILITOT 0.4 06/30/2017   ALKPHOS 73 06/30/2017   AST 13 06/30/2017   ALT 12 06/30/2017   PROT 6.1 06/30/2017   ALBUMIN 3.6 06/30/2017   CALCIUM 8.7 06/30/2017   GFR 90.68 06/30/2017    Lab Results  Component Value Date   CHOL 131 06/30/2017   Lab Results  Component Value Date   HDL 45.70 06/30/2017   Lab Results  Component Value Date   LDLCALC 73 06/30/2017   Lab Results  Component Value Date   TRIG 61.0 06/30/2017   Lab Results  Component Value Date   CHOLHDL 3 06/30/2017   Lab Results  Component Value Date   HGBA1C 4.7 06/30/2017      Assessment & Plan:   Problem List Items Addressed This Visit       Cardiovascular and Mediastinum   Chronic migraine with aura    Poor historian. States she has migraines often but unable to tell me how many times a week she has them. Did know she was on Topamax and states with the recent dose increase that it was helpful. Blood pressure is WNL in office. She recently got her eyes exam as she is suppose to wear glasses but have lost her pair. In the meantime she was wearing her husbands pair of glasses. Her new prescription is suppose to be in within the next 2 weeks. Pending labs and glasses. Follow up 1 month to see if improvement. May need adjustment          Respiratory   Seasonal allergic rhinitis due to pollen    Maintained on xyzal, singluar and as needed albuterol inhaler. Continue medications         Digestive  Gastroesophageal reflux disease without esophagitis     Nervous and Auditory   Cognitive dysfunction    Patient and mom presented for concentration medications. Has never been medicatied for concentration issues before. Did fill out a PHQ-9, GAD-7 and ADHD survey. Will order labs. Unable to get them in office today. Did place future orders and stressed the importance of getting labs drawn. Pending results       Relevant Orders   Ambulatory referral to Psychology   CBC   Comprehensive metabolic panel   Lipid panel   TSH   Vitamin B12     Other   Edema - Primary   Relevant Orders   CBC   Comprehensive metabolic panel   Brain natriuretic peptide    No orders of the defined types  were placed in this encounter.   Follow-up: Return in about 1 month (around 08/21/2021) for Headache recheck.   This visit occurred during the SARS-CoV-2 public health emergency.  Safety protocols were in place, including screening questions prior to the visit, additional usage of staff PPE, and extensive cleaning of exam room while observing appropriate contact time as indicated for disinfecting solutions.   Audria Nine, NP

## 2021-07-21 NOTE — Assessment & Plan Note (Signed)
Poor historian. States she has migraines often but unable to tell me how many times a week she has them. Did know she was on Topamax and states with the recent dose increase that it was helpful. Blood pressure is WNL in office. She recently got her eyes exam as she is suppose to wear glasses but have lost her pair. In the meantime she was wearing her husbands pair of glasses. Her new prescription is suppose to be in within the next 2 weeks. Pending labs and glasses. Follow up 1 month to see if improvement. May need adjustment

## 2021-07-21 NOTE — Assessment & Plan Note (Signed)
Patient and mom presented for concentration medications. Has never been medicatied for concentration issues before. Did fill out a PHQ-9, GAD-7 and ADHD survey. Will order labs. Unable to get them in office today. Did place future orders and stressed the importance of getting labs drawn. Pending results

## 2021-07-21 NOTE — Assessment & Plan Note (Signed)
Maintained on xyzal, singluar and as needed albuterol inhaler. Continue medications

## 2021-07-21 NOTE — Patient Instructions (Signed)
Placed order for labs. Please get as soon as possible Follow up with me in 1 month for recheck of headaches Referral for neurocognitive testing

## 2021-07-22 ENCOUNTER — Other Ambulatory Visit (INDEPENDENT_AMBULATORY_CARE_PROVIDER_SITE_OTHER): Payer: Medicare HMO

## 2021-07-22 ENCOUNTER — Other Ambulatory Visit: Payer: Medicare HMO

## 2021-07-22 ENCOUNTER — Other Ambulatory Visit: Payer: Self-pay

## 2021-07-22 DIAGNOSIS — R609 Edema, unspecified: Secondary | ICD-10-CM

## 2021-07-22 DIAGNOSIS — F09 Unspecified mental disorder due to known physiological condition: Secondary | ICD-10-CM | POA: Diagnosis not present

## 2021-07-22 LAB — COMPREHENSIVE METABOLIC PANEL
ALT: 9 U/L (ref 0–35)
AST: 12 U/L (ref 0–37)
Albumin: 3.5 g/dL (ref 3.5–5.2)
Alkaline Phosphatase: 97 U/L (ref 39–117)
BUN: 16 mg/dL (ref 6–23)
CO2: 28 mEq/L (ref 19–32)
Calcium: 8.6 mg/dL (ref 8.4–10.5)
Chloride: 105 mEq/L (ref 96–112)
Creatinine, Ser: 0.69 mg/dL (ref 0.40–1.20)
GFR: 105.35 mL/min (ref >60.00)
Glucose, Bld: 87 mg/dL (ref 70–99)
Potassium: 3.9 mEq/L (ref 3.5–5.1)
Sodium: 140 mEq/L (ref 135–145)
Total Bilirubin: 0.4 mg/dL (ref 0.2–1.2)
Total Protein: 6.1 g/dL (ref 6.0–8.3)

## 2021-07-22 LAB — LIPID PANEL
Cholesterol: 127 mg/dL (ref 0–200)
HDL: 43.8 mg/dL (ref >39.00)
LDL Cholesterol: 65 mg/dL (ref 0–99)
NonHDL: 83.68
Total CHOL/HDL Ratio: 3
Triglycerides: 94 mg/dL (ref 0.0–149.0)
VLDL: 18.8 mg/dL (ref 0.0–40.0)

## 2021-07-22 LAB — VITAMIN B12: Vitamin B-12: 313 pg/mL (ref 211–911)

## 2021-07-22 LAB — CBC
HCT: 38.7 % (ref 36.0–46.0)
Hemoglobin: 12.8 g/dL (ref 12.0–15.0)
MCHC: 33.1 g/dL (ref 30.0–36.0)
MCV: 90.5 fl (ref 78.0–100.0)
Platelets: 254 10*3/uL (ref 150.0–400.0)
RBC: 4.28 Mil/uL (ref 3.87–5.11)
RDW: 13.2 % (ref 11.5–15.5)
WBC: 5.6 10*3/uL (ref 4.0–10.5)

## 2021-07-22 LAB — BRAIN NATRIURETIC PEPTIDE: Pro B Natriuretic peptide (BNP): 30 pg/mL (ref 0.0–100.0)

## 2021-07-22 LAB — TSH: TSH: 0.9 u[IU]/mL (ref 0.35–5.50)

## 2021-07-25 ENCOUNTER — Other Ambulatory Visit: Payer: Self-pay

## 2021-07-25 DIAGNOSIS — G43109 Migraine with aura, not intractable, without status migrainosus: Secondary | ICD-10-CM

## 2021-07-25 MED ORDER — TOPIRAMATE 25 MG PO TABS: 25.0000 mg | ORAL_TABLET | Freq: Two times a day (BID) | ORAL | 1 refills | Status: DC

## 2021-07-25 NOTE — Telephone Encounter (Signed)
Refill request from [facility] for Topiramate. Last refilled by Dr Selena Batten on 05/05/21 for # 180 with 0 refill. LOV 07/21/21 with Audria Nine. Next appointment on 08/11/21

## 2021-08-11 ENCOUNTER — Ambulatory Visit (INDEPENDENT_AMBULATORY_CARE_PROVIDER_SITE_OTHER): Payer: Medicare HMO | Admitting: Nurse Practitioner

## 2021-08-11 ENCOUNTER — Encounter: Payer: Self-pay | Admitting: Nurse Practitioner

## 2021-08-11 ENCOUNTER — Other Ambulatory Visit: Payer: Self-pay

## 2021-08-11 VITALS — BP 134/96 | HR 108 | Temp 97.9°F | Resp 12 | Ht 67.5 in | Wt 256.5 lb

## 2021-08-11 DIAGNOSIS — H6191 Disorder of right external ear, unspecified: Secondary | ICD-10-CM | POA: Diagnosis not present

## 2021-08-11 DIAGNOSIS — G43109 Migraine with aura, not intractable, without status migrainosus: Secondary | ICD-10-CM

## 2021-08-11 NOTE — Patient Instructions (Signed)
Great news that headaches are improving. Continue taking the medication as prescribed. Also keep your appointment with Dr. Tomasa Hose for further testing in regard to attention.  Dr. Tomasa Hose information [address] [place], [place], [place] [address] [phone].

## 2021-08-11 NOTE — Progress Notes (Signed)
Established Patient Office Visit  Subjective:  Patient ID: Kathy Gamble, female    DOB: 11/15/1976  Age: 44 y.o. MRN: [id number]  CC:  Chief Complaint  Patient presents with   Transfer of Care   Follow-up    On headaches. Still having issues with this but vague. Takes Topamax twice daily-tries to follow this plan. And taking Ibuprofen as needed.    HPI Kathy Gamble presents for follow up on migrianes.  States got her glasses about 3 weeks. States she has been wearing them as prescribed. She also states that she has worked hard on taking her Topamax as prescribed and seeing an improvement of her headaches. States they are less in frequency. Still using Ibuprofen as needed for abortive therapy.  Elevated BP noticed in office. But upon recheck it was better. When asked if she has history of elevated BP she states "not that she knows of".   Skin issue. Patient had a question regarding her ripped ear lobe. The injury happened well in the past  Past Medical History:  Diagnosis Date   Allergy    Bee sting allergy 03/02/2015   Chicken pox    Frequent headaches    GERD (gastroesophageal reflux disease)     Past Surgical History:  Procedure Laterality Date   ADENOIDECTOMY     TONSILLECTOMY      Family History  Problem Relation Age of Onset   Stroke Mother    Mental retardation Mother    Cancer Maternal Grandmother    Cancer Paternal Grandmother        Breast   Heart disease Paternal Grandfather     Social History   Socioeconomic History   Marital status: Married    Spouse name: Not on file   Number of children: Not on file   Years of education: Not on file   Highest education level: Not on file  Occupational History   Not on file  Tobacco Use   Smoking status: Never   Smokeless tobacco: Never  Substance and Sexual Activity   Alcohol use: No    Alcohol/week: 0.0 standard drinks   Drug use: Yes    Types: Cocaine   Sexual activity: Yes    Birth  control/protection: None  Other Topics Concern   Not on file  Social History Narrative   Not on file   Social Determinants of Health   Financial Resource Strain: Not on file  Food Insecurity: Not on file  Transportation Needs: Not on file  Physical Activity: Not on file  Stress: Not on file  Social Connections: Not on file  Intimate Partner Violence: Not on file    Outpatient Medications Prior to Visit  Medication Sig Dispense Refill   albuterol (VENTOLIN HFA) 108 (90 Base) MCG/ACT inhaler INHALE 1-2 PUFFS into THE lungs EVERY 6 HOURS AS NEEDED FOR SHORTNESS OF BREATH OR wheezing 8.5 g 1   fluticasone (FLONASE) 50 MCG/ACT nasal spray Place 1 spray into both nostrils daily. 48 mL 0   levocetirizine (XYZAL) 5 MG tablet TAKE 1 TABLET BY MOUTH EVERY DAY IN THE EVENING 90 tablet 0   montelukast (SINGULAIR) 10 MG tablet TAKE 1 TABLET BY MOUTH DAILY FOR SINUSES. 90 tablet 0   Multiple Vitamin (MULTIVITAMIN) capsule Take 1 capsule by mouth daily.     omeprazole (PRILOSEC) 20 MG capsule Take 1 capsule (20 mg total) by mouth daily. 90 capsule 0   tobramycin (TOBREX) 0.3 % ophthalmic solution Place 2 drops into the  right eye every 6 (six) hours. 5 mL 0   topiramate (TOPAMAX) 25 MG tablet Take 1 tablet (25 mg total) by mouth 2 (two) times daily. 180 tablet 1   No facility-administered medications prior to visit.    Allergies  Allergen Reactions   Bee Venom Itching   Latex Rash    The powder in the gloves causes rash    ROS Review of Systems  Constitutional:  Negative for chills and fever.  Respiratory:  Negative for shortness of breath.   Cardiovascular:  Negative for chest pain.  Gastrointestinal:  Negative for diarrhea, nausea and vomiting.  Neurological:  Positive for headaches. Negative for weakness.  Psychiatric/Behavioral:  Negative for behavioral problems and confusion.      Objective:    Physical Exam Vitals and nursing note reviewed.  Constitutional:      Appearance:  Normal appearance.  HENT:     Right Ear: Tympanic membrane, ear canal and external ear normal.     Left Ear: Tympanic membrane, ear canal and external ear normal.  Cardiovascular:     Rate and Rhythm: Normal rate and regular rhythm.  Pulmonary:     Effort: Pulmonary effort is normal.     Breath sounds: Normal breath sounds.  Abdominal:     General: Bowel sounds are normal.  Neurological:     Mental Status: She is alert. Mental status is at baseline.  Psychiatric:        Mood and Affect: Mood normal.        Thought Content: Thought content normal.    BP (!) 134/96   Pulse (!) 108   Temp 97.9 F (36.6 C)   Resp 12   Ht 5' 7.5" (1.715 m)   Wt 256 lb 8 oz (116.3 kg)   SpO2 98%   BMI 39.58 kg/m  Wt Readings from Last 3 Encounters:  08/11/21 256 lb 8 oz (116.3 kg)  07/21/21 255 lb 4 oz (115.8 kg)  03/14/21 291 lb (132 kg)      Health Maintenance Due  Topic Date Due   HIV Screening  Never done   Hepatitis C Screening  Never done   PAP SMEAR-Modifier  01/02/2018   INFLUENZA VACCINE  07/14/2021    There are no preventive care reminders to display for this patient.  Lab Results  Component Value Date   TSH 0.90 07/22/2021   Lab Results  Component Value Date   WBC 5.6 07/22/2021   HGB 12.8 07/22/2021   HCT 38.7 07/22/2021   MCV 90.5 07/22/2021   PLT 254.0 07/22/2021   Lab Results  Component Value Date   NA 140 07/22/2021   K 3.9 07/22/2021   CO2 28 07/22/2021   GLUCOSE 87 07/22/2021   BUN 16 07/22/2021   CREATININE 0.69 07/22/2021   BILITOT 0.4 07/22/2021   ALKPHOS 97 07/22/2021   AST 12 07/22/2021   ALT 9 07/22/2021   PROT 6.1 07/22/2021   ALBUMIN 3.5 07/22/2021   CALCIUM 8.6 07/22/2021   GFR 105.35 07/22/2021   Lab Results  Component Value Date   CHOL 127 07/22/2021   Lab Results  Component Value Date   HDL 43.80 07/22/2021   Lab Results  Component Value Date   LDLCALC 65 07/22/2021   Lab Results  Component Value Date   TRIG 94.0  07/22/2021   Lab Results  Component Value Date   CHOLHDL 3 07/22/2021   Lab Results  Component Value Date   HGBA1C 4.7 06/30/2017  Assessment & Plan:   Problem List Items Addressed This Visit       Cardiovascular and Mediastinum   Chronic migraine with aura    States she has had some improvement with wearing her new glasses and taking the topamax as prescribed. We will continue the topamax as is and evaluate in the future. 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 Did discuss that the edges would need to be reopened and then sutured together. We discussed risk versus benefits and she decided to not pursue that.       No orders of the defined types were placed in this encounter.   Follow-up: Return in about 3 months (around 11/11/2021) for CPE.  This visit occurred during the SARS-CoV-2 public health emergency.  Safety protocols were in place, including screening questions prior to the visit, additional usage of staff PPE, and extensive cleaning of exam room while observing appropriate contact time as indicated for disinfecting solutions.     11/13/2021, NP

## 2021-08-11 NOTE — Assessment & Plan Note (Signed)
Patient had question in regards to an old ear lobe injury. Did discuss that the edges would need to be reopened and then sutured together. We discussed risk versus benefits and she decided to not pursue that.

## 2021-08-11 NOTE — Assessment & Plan Note (Signed)
States she has had some improvement with wearing her new glasses and taking the topamax as prescribed. We will continue the topamax as is and evaluate in the future. Continue topamax 25mg  BID

## 2021-08-12 ENCOUNTER — Encounter: Payer: Medicare HMO | Admitting: Nurse Practitioner

## 2021-08-25 DIAGNOSIS — F411 Generalized anxiety disorder: Secondary | ICD-10-CM | POA: Diagnosis not present

## 2021-08-26 ENCOUNTER — Telehealth: Payer: Self-pay | Admitting: Nurse Practitioner

## 2021-08-26 NOTE — Telephone Encounter (Signed)
Pt is requesting a call from Matt's CMA to discuss the Topamax. She states she has questions about the medication.

## 2021-08-27 NOTE — Telephone Encounter (Signed)
Spoke with patient. Patient wanted to know if she should continue taking Topamax. Aa per LOV note it was advised for patient to continue taking medication twice daily. Patient stated she has been taking this as directed and wearing her new glasses. Patient states headaches come and go and mainly bother her when she looks at the light otherwise doing ok with them. Advised patient if she develops new or worsening symptoms or wants to discuss other treatments to make an appointment to discuss. Patient verbalized understanding. No other new symptoms.  FYI to PCP

## 2021-09-03 ENCOUNTER — Telehealth: Payer: Self-pay | Admitting: *Deleted

## 2021-09-03 NOTE — Telephone Encounter (Signed)
Does the patient have any way of checking her blood pressure? If so can we get her to check it. If it is elevated this can cause nosebleeds and headaches.  Is her headache anything different from what she normally has? I see in the note that it says they are similar to the ones she was diagnosed with in the past.  Has she been using the flonase on a regular and consistent basis? It can cause nose bleeds. She can stop taking the flonase for the next 3-5 days and get some ocean spray nasal saline to help keep her nostrils moistened

## 2021-09-03 NOTE — Telephone Encounter (Signed)
PLEASE NOTE: All timestamps contained within this report are represented as [place] Standard Time. CONFIDENTIALTY NOTICE: This fax transmission is intended only for the addressee. It contains information that is legally privileged, confidential or otherwise protected from use or disclosure. If you are not the intended recipient, you are strictly prohibited from reviewing, disclosing, copying using or disseminating any of this information or taking any action in reliance on or regarding this information. If you have received this fax in error, please notify us immediately by telephone so that we can arrange for its return to [place]. Phone: [phone], Toll-Free: [phone], Fax: [phone] Page: 1 of 2 Call Id: [id number] [facility] Day - Client TELEPHONE ADVICE RECORD AccessNurse Patient Name: Kathy Gamble Gender: Female DOB: 02/02/1976 Age: 45 Y 9 M 22 D Return Phone Number: [phone] (Secondary), [phone] (Alternate) Address: City/ State/ Zip: [place] [address] Client [facility] Day - Client Client Site [facility] - Day Physician Audria Nine- NP Contact Type Call Who Is Calling Patient / Member / Family / Caregiver Call Type Triage / Clinical Relationship To Patient Self Return Phone Number [phone] (Alternate) Chief Complaint Headache Reason for Call Symptomatic / Request for Health Information Initial Comment Just had a nose bleed, but has stopped and having a headache now. Translation No Nurse Assessment Nurse: Elesa Hacker, RN, Nash Dimmer Date/Time Lamount Cohen Time): 09/03/2021 10:43:07 AM Confirm and document reason for call. If symptomatic, describe symptoms. ---Just had a nose bleed, but has stopped and having a headache now. [name] had a nosebleed on Monday. Advised that it lasted 5-10 minutes. Headache now. Does the patient have any new or worsening symptoms? ---Yes Will a triage be  completed? ---Yes Related visit to physician within the last 2 weeks? ---No Does the PT have any chronic conditions? (i.e. diabetes, asthma, this includes High risk factors for pregnancy, etc.) ---Yes List chronic conditions. ---allergies headaches , ADHD Is the patient pregnant or possibly pregnant? (Ask all females between the ages of 17-55) ---No Is this a behavioral health or substance abuse call? ---No Guidelines Guideline Title Affirmed Question Affirmed Notes Nurse Date/Time (Eastern Time) Headache Similar to previously diagnosed migraine headaches Deaton, RN, Nash Dimmer 09/03/2021 10:46:12 AM Disp. Time Lamount Cohen Time) Disposition Final User 09/03/2021 10:54:45 AM Home Care Yes Deaton, RN, Nash Dimmer PLEASE NOTE: All timestamps contained within this report are represented as [place] Standard Time. CONFIDENTIALTY NOTICE: This fax transmission is intended only for the addressee. It contains information that is legally privileged, confidential or otherwise protected from use or disclosure. If you are not the intended recipient, you are strictly prohibited from reviewing, disclosing, copying using or disseminating any of this information or taking any action in reliance on or regarding this information. If you have received this fax in error, please notify us immediately by telephone so that we can arrange for its return to [place]. Phone: [phone], Toll-Free: [phone], Fax: [phone] Page: 2 of 2 Call Id: [id number] [name] Disagree/Comply Comply [name] Understands Yes PreDisposition Did not know what to do Care Advice Given Per Guideline HOME CARE: * You should be able to treat this at home. REASSURANCE AND EDUCATION - MIGRAINE HEADACHE: * You have told me that this headache is like migraine headaches that you have had before. If the pattern or amount of pain of your headache changes, you will need to see your doctor (or NP/PA). * Migraine headaches are also called vascular headaches. A  migraine can be anywhere from mild to very  severe. People with migraines often describe it as throbbing or pulsing. It is often just on one side. Other symptoms include nausea and vomiting. Some people will have visual or other warning symptoms (aura) that a migraine is coming. * This sounds like a painful headache, but there are pain medicines you can take and other instructions I can give you to reduce the pain. MIGRAINE MEDICATION: * If your doctor has prescribed you specific medication for your migraine, take it as directed as soon as the migraine starts. * ACETAMINOPHEN - EXTRA STRENGTH TYLENOL: Take 1,000 mg (two 500 mg pills) every 6 to 8 hours as needed. Each Extra Strength Tylenol pill has 500 mg of acetaminophen. The most you should take is 6 pills a day (3,000 mg total). Note: In [place], the maximum is 8 pills a day (4,000 mg total). * NAPROXEN (E.G., ALEVE): Take 220 mg (one 220 mg pill) by mouth every 8 to 12 hours as needed. You may take 440 mg (two 220 mg pills) for your first dose. The most you should take is 3 pills a day (660 mg total). Note: In [place], the maximum is 2 pills a day (one every 12 hours; 440 mg total). REST: * Lie down in a dark quiet place and relax until feeling better. * Close your eyes and imagine your entire body relaxing. * Try to fall asleep. Individuals with migraines often awaken from sleep with their headache relieved. LOCAL COLD: * Apply a cold wet washcloth or cold pack to the forehead for 20 minutes. CALL BACK IF: * Severe headache persists over 2 hours after pain medicine * Headache lasts over 72 hours * Stiff neck occurs (i.e., can't touch chin to chest) * You become worse CARE ADVICE given per Headache (Adult) guideline.

## 2021-09-03 NOTE — Telephone Encounter (Signed)
Spoke with patient. Patient states she had an episode of nose bleed on 09/01/21 and after 5 to 10 minutes this resolved. Patient states it was a stressful day then and had a lot of tension. No more no se bleeds since then. Her headaches are almost daily but the headache she is having now is different, it is located in the back of her head which is not her normal, described it as a pounding sensation. Pain does not radiate anywhere else. No nausea, blurred vision, dizziness, or syncope. Patient stated she feels like maybe she is stressed today. Pain is not severe but able to notice it and it is better than it was earlier this week. B/P 144/100 and pulse 77 currently.  Spoke with Audria Nine, advised patient per Susy Frizzle try to de stress, keep a check on b/p and notify us if readings continue to stay elevated. If headache worsens and/or new symptoms develop patient to be evaluated at Urgent Care or our office. Patient verbalized understanding.

## 2021-09-08 DIAGNOSIS — F411 Generalized anxiety disorder: Secondary | ICD-10-CM | POA: Diagnosis not present

## 2021-09-19 ENCOUNTER — Telehealth: Payer: Self-pay | Admitting: Nurse Practitioner

## 2021-09-19 NOTE — Telephone Encounter (Signed)
No pain she just wanted to let [facility] know. FYI
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
## 2021-09-23 ENCOUNTER — Other Ambulatory Visit: Payer: Self-pay | Admitting: Family Medicine

## 2021-09-23 DIAGNOSIS — J302 Other seasonal allergic rhinitis: Secondary | ICD-10-CM

## 2021-09-25 ENCOUNTER — Other Ambulatory Visit: Payer: Self-pay | Admitting: Nurse Practitioner

## 2021-09-25 ENCOUNTER — Telehealth: Payer: Self-pay

## 2021-09-25 DIAGNOSIS — J302 Other seasonal allergic rhinitis: Secondary | ICD-10-CM

## 2021-09-25 DIAGNOSIS — S93601D Unspecified sprain of right foot, subsequent encounter: Secondary | ICD-10-CM | POA: Diagnosis not present

## 2021-09-25 DIAGNOSIS — S99921A Unspecified injury of right foot, initial encounter: Secondary | ICD-10-CM | POA: Diagnosis not present
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
## 2021-09-25 NOTE — Telephone Encounter (Signed)
Unable to reach pt or pts husband by phone. Per access nurse note pt agreed to comply with advice given to either go to ED or UC. Sending note to Iven Finn NP and Anastasiya CMA. Also see 09/19/21 phone note.

## 2021-09-25 NOTE — Telephone Encounter (Signed)
Hopefully she went to be evaluated. Thank you

## 2021-09-26 DIAGNOSIS — M7989 Other specified soft tissue disorders: Secondary | ICD-10-CM | POA: Diagnosis not present

## 2021-09-29 NOTE — Telephone Encounter (Signed)
Pt called back she went to [facility] clinic they told her she had fluid built up in her right leg and she is walking sideways. She is putting ice on it as needed and they gave her a boot.

## 2021-09-29 NOTE — Telephone Encounter (Signed)
Left message for patient to call back to follow up  

## 2021-11-07 ENCOUNTER — Other Ambulatory Visit: Payer: Self-pay | Admitting: Nurse Practitioner

## 2021-11-07 DIAGNOSIS — J302 Other seasonal allergic rhinitis: Secondary | ICD-10-CM

## 2021-11-10 NOTE — Telephone Encounter (Signed)
Spoke with [facility]. Patient refilled inhaler on 09/25/21 and 10/23/21. Will call patient to ask how often she is using this inhaler later today

## 2021-11-10 NOTE — Telephone Encounter (Signed)
Spoke with patient. Patient states she has been using Albuterol inhaler about 2 times a week or so. She has had a cough in the last week or so and has used the inhaler a little bit more. They have been passing the cough/cold in their home to each other. Patient is using Flonase spray also

## 2021-11-17 ENCOUNTER — Encounter: Payer: Medicare HMO | Admitting: Nurse Practitioner

## 2021-11-18 ENCOUNTER — Encounter: Payer: Medicare HMO | Admitting: Nurse Practitioner

## 2021-12-03 ENCOUNTER — Other Ambulatory Visit: Payer: Self-pay

## 2021-12-03 ENCOUNTER — Encounter: Payer: Self-pay | Admitting: Nurse Practitioner

## 2021-12-03 ENCOUNTER — Other Ambulatory Visit: Payer: Self-pay | Admitting: Nurse Practitioner

## 2021-12-03 ENCOUNTER — Ambulatory Visit (INDEPENDENT_AMBULATORY_CARE_PROVIDER_SITE_OTHER): Payer: Medicare HMO | Admitting: Nurse Practitioner

## 2021-12-03 VITALS — BP 122/80 | HR 88 | Temp 97.6°F | Ht 67.5 in | Wt 260.0 lb

## 2021-12-03 DIAGNOSIS — G43109 Migraine with aura, not intractable, without status migrainosus: Secondary | ICD-10-CM

## 2021-12-03 DIAGNOSIS — J069 Acute upper respiratory infection, unspecified: Secondary | ICD-10-CM

## 2021-12-03 DIAGNOSIS — Z79899 Other long term (current) drug therapy: Secondary | ICD-10-CM

## 2021-12-03 DIAGNOSIS — N898 Other specified noninflammatory disorders of vagina: Secondary | ICD-10-CM | POA: Diagnosis not present

## 2021-12-03 DIAGNOSIS — Z124 Encounter for screening for malignant neoplasm of cervix: Secondary | ICD-10-CM

## 2021-12-03 DIAGNOSIS — Z0000 Encounter for general adult medical examination without abnormal findings: Secondary | ICD-10-CM | POA: Insufficient documentation

## 2021-12-03 DIAGNOSIS — Z1231 Encounter for screening mammogram for malignant neoplasm of breast: Secondary | ICD-10-CM

## 2021-12-03 DIAGNOSIS — Z1211 Encounter for screening for malignant neoplasm of colon: Secondary | ICD-10-CM

## 2021-12-03 LAB — TSH: TSH: 1.83 u[IU]/mL (ref 0.35–5.50)

## 2021-12-03 LAB — HEMOGLOBIN A1C: Hgb A1c MFr Bld: 4.9 % (ref 4.6–6.5)

## 2021-12-03 LAB — CBC WITH DIFFERENTIAL/PLATELET
Basophils Absolute: 0 10*3/uL (ref 0.0–0.1)
Basophils Relative: 0.5 % (ref 0.0–3.0)
Eosinophils Absolute: 0.1 10*3/uL (ref 0.0–0.7)
Eosinophils Relative: 1.7 % (ref 0.0–5.0)
HCT: 37.9 % (ref 36.0–46.0)
Hemoglobin: 12.6 g/dL (ref 12.0–15.0)
Lymphocytes Relative: 39.3 % (ref 12.0–46.0)
Lymphs Abs: 2.4 10*3/uL (ref 0.7–4.0)
MCHC: 33.2 g/dL (ref 30.0–36.0)
MCV: 88.1 fl (ref 78.0–100.0)
Monocytes Absolute: 0.4 10*3/uL (ref 0.1–1.0)
Monocytes Relative: 5.8 % (ref 3.0–12.0)
Neutro Abs: 3.2 10*3/uL (ref 1.4–7.7)
Neutrophils Relative %: 52.7 % (ref 43.0–77.0)
Platelets: 280 10*3/uL (ref 150.0–400.0)
RBC: 4.3 Mil/uL (ref 3.87–5.11)
RDW: 13.8 % (ref 11.5–15.5)
WBC: 6 10*3/uL (ref 4.0–10.5)

## 2021-12-03 LAB — LIPID PANEL
Cholesterol: 141 mg/dL (ref 0–200)
HDL: 49.8 mg/dL (ref >39.00)
LDL Cholesterol: 76 mg/dL (ref 0–99)
NonHDL: 91.14
Total CHOL/HDL Ratio: 3
Triglycerides: 76 mg/dL (ref 0.0–149.0)
VLDL: 15.2 mg/dL (ref 0.0–40.0)

## 2021-12-03 LAB — COMPREHENSIVE METABOLIC PANEL
ALT: 11 U/L (ref 0–35)
AST: 12 U/L (ref 0–37)
Albumin: 3.7 g/dL (ref 3.5–5.2)
Alkaline Phosphatase: 116 U/L (ref 39–117)
BUN: 18 mg/dL (ref 6–23)
CO2: 28 mEq/L (ref 19–32)
Calcium: 8.7 mg/dL (ref 8.4–10.5)
Chloride: 105 mEq/L (ref 96–112)
Creatinine, Ser: 0.69 mg/dL (ref 0.40–1.20)
GFR: 105.08 mL/min (ref >60.00)
Glucose, Bld: 85 mg/dL (ref 70–99)
Potassium: 3.9 mEq/L (ref 3.5–5.1)
Sodium: 139 mEq/L (ref 135–145)
Total Bilirubin: 0.3 mg/dL (ref 0.2–1.2)
Total Protein: 6.3 g/dL (ref 6.0–8.3)

## 2021-12-03 LAB — POC COVID19 BINAXNOW: SARS Coronavirus 2 Ag: NEGATIVE

## 2021-12-03 MED ORDER — AZITHROMYCIN 250 MG PO TABS: ORAL_TABLET | ORAL | 0 refills | Status: AC

## 2021-12-03 NOTE — Assessment & Plan Note (Signed)
Discussed appropriate authorizations and screening exams with patient orders placed.

## 2021-12-03 NOTE — Assessment & Plan Note (Signed)
Patient is complaining about still having headaches even on the low-dose topiramate.  Did discuss with patient about increasing the dose to 100 mg total.  She is currently on 25 mg twice daily did discuss with patient we will start with 25 mg every morning and 50 mg nightly for 1 week and then increase to 50 mg every morning and 50 mg nightly.  Patient will follow up in 2 months for recheck after dose titration.  She is also using a lot of over-the-counter analgesics told patient to get an allergy med rehab doing with medication overuse headache.  This continues we may need to think about getting imaging of the head.  Patient knowledge

## 2021-12-03 NOTE — Patient Instructions (Addendum)
For the headache medicine Topirimate (topamax). You will take 1 tablet (25mg ) in the morning and then 2 tablets (50mg ) at night for a week. Then the next week you will take 2 tablets (50mg ) in the morning and 2 tablets (50mg ) at night time. That will be the new dose.  Follow up with me in 2 months to see if it helped

## 2021-12-03 NOTE — Assessment & Plan Note (Signed)
Given duration of symptoms will treat patient.  Discussed this with her she is to let me know if she has increased and symptoms of bloody mucus when she coughs or if it does not continue antibiotic treatment.  QTc within normal range.

## 2021-12-03 NOTE — Assessment & Plan Note (Signed)
Incidental finding on exam to do wet prep likely overgrowth of normal bacteria but patient asymptomatic.

## 2021-12-03 NOTE — Progress Notes (Signed)
Established Patient Office Visit  Subjective:  Patient ID: Kathy Gamble, female    DOB: [birth year]  Age: 45 y.o. MRN: [id number]  CC:  Chief Complaint  Patient presents with   Annual Exam    Patient states topamax is not helping at all. Patient is taking BID but makes her aggravated and headaches worse.      HPI Kathy Gamble presents for complete physical and follow up of chronic conditions.  Immunizations: -Tetanus: 01/02/2015 -Influenza: 12/11/2020, need today -Covid-19: 03/21/2020, 04/12/2020, 12/11/2020 -Shingles: NA -Pneumonia: NA  -HPV:NA  Diet: Fair diet.  Snacks and breakfast and lunch. Has coffee cream and sugar Exercise: No regular exercise. Walks to [employer] and back daily  Eye exam: Completes annually [place] Dental exam: Completes semi-annually. Needs a new clinic  Pap Smear: Completed in 2016 Mammogram: order today for [place] Dexa:  Colonoscopy: Never order today   Lung Cancer Screening: NA   Lmp: Heavy and end of november  Headaches: Has been maintained on topiramate  URI: approx 1 week in length. States she is having yellow   Past Medical History:  Diagnosis Date   Allergy    Bee sting allergy 03/02/2015   Chicken pox    Frequent headaches    GERD (gastroesophageal reflux disease)     Past Surgical History:  Procedure Laterality Date   ADENOIDECTOMY     TONSILLECTOMY      Family History  Problem Relation Age of Onset   Stroke Mother    Mental retardation Mother    Cancer Maternal Grandmother    Cancer Paternal Grandmother        Breast   Heart disease Paternal Grandfather     Social History   Socioeconomic History   Marital status: Married    Spouse name: Not on file   Number of children: Not on file   Years of education: Not on file   Highest education level: Not on file  Occupational History   Not on file  Tobacco Use   Smoking status: Never   Smokeless tobacco: Never  Substance and Sexual Activity   Alcohol  use: No    Alcohol/week: 0.0 standard drinks   Drug use: Yes    Types: Cocaine   Sexual activity: Yes    Birth control/protection: None  Other Topics Concern   Not on file  Social History Narrative   Not on file   Social Determinants of Health   Financial Resource Strain: Not on file  Food Insecurity: Not on file  Transportation Needs: Not on file  Physical Activity: Not on file  Stress: Not on file  Social Connections: Not on file  Intimate Partner Violence: Not on file    Outpatient Medications Prior to Visit  Medication Sig Dispense Refill   albuterol (VENTOLIN HFA) 108 (90 Base) MCG/ACT inhaler INHALE 1-2 PUFFS BY MOUTH into THE lungs EVERY 6 HOURS AS NEEDED FOR SHORTNESS OF BREATH OR wheezing 8.5 g 1   fluticasone (FLONASE) 50 MCG/ACT nasal spray INSTILL 1 SPRAY IN EACH NOSTRIL ONCE DAILY 48 g 3   levocetirizine (XYZAL) 5 MG tablet TAKE 1 TABLET BY MOUTH EVERY DAY IN THE EVENING 90 tablet 0   montelukast (SINGULAIR) 10 MG tablet TAKE ONE TABLET BY MOUTH ONCE DAILY FOR sinuses 90 tablet 3   Multiple Vitamin (MULTIVITAMIN) capsule Take 1 capsule by mouth daily.     omeprazole (PRILOSEC) 20 MG capsule TAKE ONE CAPSULE BY MOUTH ONCE DAILY 90 capsule 3   tobramycin (  TOBREX) 0.3 % ophthalmic solution Place 2 drops into the right eye every 6 (six) hours. 5 mL 0   topiramate (TOPAMAX) 25 MG tablet Take 1 tablet (25 mg total) by mouth 2 (two) times daily. 180 tablet 1   No facility-administered medications prior to visit.    Allergies  Allergen Reactions   Bee Venom Itching   Latex Rash    The powder in the gloves causes rash    ROS Review of Systems  Constitutional:  Negative for chills, fatigue and fever.  HENT:  Negative for sinus pressure, sinus pain and sore throat.   Respiratory:  Positive for cough (coughing up blood and mucous. Mostly yellow mucous with some blood tinged). Negative for shortness of breath.   Cardiovascular:  Positive for leg swelling. Negative for  chest pain and palpitations.  Gastrointestinal:  Negative for constipation, nausea and vomiting.       Every other day  Genitourinary:  Negative for dysuria, frequency, vaginal bleeding, vaginal discharge and vaginal pain.  Neurological:  Positive for headaches. Negative for numbness.  Psychiatric/Behavioral:  Negative for hallucinations and suicidal ideas.      Objective:    Physical Exam Vitals and nursing note reviewed. Exam conducted with a chaperone present Colonel Bald, CMA).  Constitutional:      Appearance: She is obese.  HENT:     Right Ear: Tympanic membrane, ear canal and external ear normal. There is no impacted cerumen.     Left Ear: Tympanic membrane, ear canal and external ear normal. There is no impacted cerumen.     Nose:     Right Sinus: No maxillary sinus tenderness or frontal sinus tenderness.     Left Sinus: No maxillary sinus tenderness or frontal sinus tenderness.     Mouth/Throat:     Mouth: Mucous membranes are moist.     Pharynx: Oropharynx is clear. No posterior oropharyngeal erythema.  Eyes:     Extraocular Movements: Extraocular movements intact.     Pupils: Pupils are equal, round, and reactive to light.     Comments: Wears corrective lenses  Cardiovascular:     Rate and Rhythm: Normal rate and regular rhythm.     Pulses: Normal pulses.     Comments: Non pitting edema bilaterally Pulmonary:     Effort: Pulmonary effort is normal.     Breath sounds: Normal breath sounds.  Abdominal:     General: Bowel sounds are normal. There is no distension.     Palpations: There is no mass.     Tenderness: There is no abdominal tenderness.  Genitourinary:    Vagina: Vaginal discharge present.     Cervix: Normal.     Uterus: Normal.      Adnexa: Right adnexa normal and left adnexa normal.     Comments: Some bleeding post swab. Did have some white vaginal discharge on exam. Asymptomatic  Musculoskeletal:     Right lower leg: Edema present.     Left lower  leg: Edema present.  Lymphadenopathy:     Cervical: No cervical adenopathy.  Skin:    General: Skin is warm and dry.     Findings: Lesion present.     Comments: Scab to right anterior knee Scrape to left anterior knee  Neurological:     Mental Status: She is alert.     Deep Tendon Reflexes:     Reflex Scores:      Bicep reflexes are 2+ on the right side and 2+ on the left  side.      Patellar reflexes are 2+ on the right side and 2+ on the left side.    Comments: Bilateral upper and lower extremity strength 5/5  Psychiatric:        Mood and Affect: Mood normal.        Behavior: Behavior normal.    BP 122/80 (BP Location: Left Arm, Patient Position: Sitting, Cuff Size: Normal)    Pulse 88    Temp 97.6 F (36.4 C) (Temporal)    Ht 5' 7.5" (1.715 m)    Wt 260 lb (117.9 kg)    SpO2 98%    BMI 40.12 kg/m  Wt Readings from Last 3 Encounters:  12/03/21 260 lb (117.9 kg)  08/11/21 256 lb 8 oz (116.3 kg)  07/21/21 255 lb 4 oz (115.8 kg)     Health Maintenance Due  Topic Date Due   HIV Screening  Never done   Hepatitis C Screening  Never done   PAP SMEAR-Modifier  01/02/2018   COVID-19 Vaccine (4 - Booster for Pfizer series) 02/05/2021   INFLUENZA VACCINE  07/14/2021   COLONOSCOPY (Pts 45-4yrs Insurance coverage will need to be confirmed)  Never done    There are no preventive care reminders to display for this patient.  Lab Results  Component Value Date   TSH 0.90 07/22/2021   Lab Results  Component Value Date   WBC 5.6 07/22/2021   HGB 12.8 07/22/2021   HCT 38.7 07/22/2021   MCV 90.5 07/22/2021   PLT 254.0 07/22/2021   Lab Results  Component Value Date   NA 140 07/22/2021   K 3.9 07/22/2021   CO2 28 07/22/2021   GLUCOSE 87 07/22/2021   BUN 16 07/22/2021   CREATININE 0.69 07/22/2021   BILITOT 0.4 07/22/2021   ALKPHOS 97 07/22/2021   AST 12 07/22/2021   ALT 9 07/22/2021   PROT 6.1 07/22/2021   ALBUMIN 3.5 07/22/2021   CALCIUM 8.6 07/22/2021   GFR 105.35  07/22/2021   Lab Results  Component Value Date   CHOL 127 07/22/2021   Lab Results  Component Value Date   HDL 43.80 07/22/2021   Lab Results  Component Value Date   LDLCALC 65 07/22/2021   Lab Results  Component Value Date   TRIG 94.0 07/22/2021   Lab Results  Component Value Date   CHOLHDL 3 07/22/2021   Lab Results  Component Value Date   HGBA1C 4.7 06/30/2017      Assessment & Plan:   Problem List Items Addressed This Visit       Cardiovascular and Mediastinum   Chronic migraine with aura    Patient is complaining about still having headaches even on the low-dose topiramate.  Did discuss with patient about increasing the dose to 100 mg total.  She is currently on 25 mg twice daily did discuss with patient we will start with 25 mg every morning and 50 mg nightly for 1 week and then increase to 50 mg every morning and 50 mg nightly.  Patient will follow up in 2 months for recheck after dose titration.  She is also using a lot of over-the-counter analgesics told patient to get an allergy med rehab doing with medication overuse headache.  This continues we may need to think about getting imaging of the head.  Patient knowledge        Respiratory   Upper respiratory tract infection    Given duration of symptoms will treat patient.  Discussed this with  her she is to let me know if she has increased and symptoms of bloody mucus when she coughs or if it does not continue antibiotic treatment.  QTc within normal range.      Relevant Medications   azithromycin (ZITHROMAX) 250 MG tablet   Other Relevant Orders   POC COVID-19     Other   Preventative health care - Primary    Discussed appropriate authorizations and screening exams with patient orders placed.      Relevant Orders   CBC with Differential/Platelet (Completed)   Comprehensive metabolic panel (Completed)   Hemoglobin A1c (Completed)   TSH (Completed)   Lipid panel (Completed)   Vaginal discharge     Incidental finding on exam to do wet prep likely overgrowth of normal bacteria but patient asymptomatic.      Relevant Orders   WET PREP BY MOLECULAR PROBE   Other Visit Diagnoses     Screening for colon cancer       Relevant Orders   Ambulatory referral to Gastroenterology   Encounter for screening mammogram for malignant neoplasm of breast       Relevant Orders   MM Digital Screening   Screening for cervical cancer       Relevant Orders   Cytology - PAP([place])   High risk medication use       Relevant Orders   POCT urine pregnancy       No orders of the defined types were placed in this encounter.   Follow-up: Return in about 2 months (around 02/03/2022) for recheck ha.   This visit occurred during the SARS-CoV-2 public health emergency.  Safety protocols were in place, including screening questions prior to the visit, additional usage of staff PPE, and extensive cleaning of exam room while observing appropriate contact time as indicated for disinfecting solutions.   Audria Nine, NP

## 2021-12-04 LAB — WET PREP BY MOLECULAR PROBE
Candida species: NOT DETECTED
Gardnerella vaginalis: NOT DETECTED
MICRO NUMBER:: 12785876
SPECIMEN QUALITY:: ADEQUATE
Trichomonas vaginosis: NOT DETECTED

## 2021-12-09 ENCOUNTER — Telehealth: Payer: Self-pay | Admitting: Nurse Practitioner

## 2021-12-09 DIAGNOSIS — F411 Generalized anxiety disorder: Secondary | ICD-10-CM

## 2021-12-09 DIAGNOSIS — F09 Unspecified mental disorder due to known physiological condition: Secondary | ICD-10-CM

## 2021-12-09 NOTE — Telephone Encounter (Signed)
Pt mother called in to make appointment for pt to get a referral to see a psychiatrist . Advise pt mother that She's not on pt DPR and pt would need to call in to get appointment .

## 2021-12-09 NOTE — Telephone Encounter (Signed)
Matt, do you want me to call patient and ask about this or wait for patient to call to discuss? Wanted to verify before reaching out in this situation

## 2021-12-09 NOTE — Telephone Encounter (Signed)
Left message for patient to call back to be advised. Also need to see if patient is open to virtual visits or just in person. [facility] in [place] would be a while to get in for an appointment.

## 2021-12-09 NOTE — Telephone Encounter (Signed)
Spoke with patient. Patient states she saw Dr Tomasa Hose on 11/17/21, patient is having hard time driving that far to see this provider and would like to tr and see someone in [place], [place] or [place] area if at all possible. New referral put in. I reviewed lab results with patient also because she is not able to see mychart information. Also advised patient that we can not speak to anyone on her behalf if the [facility] form does not have those names on there. Right now advised patient that we have her husband and her father as contact patients and she would need to update that form if need to change or add another person. Patient verbalized understanding.

## 2021-12-09 NOTE — Telephone Encounter (Signed)
Another option for [facility]   [facility] [place] Address: [address] [place], [place] [address] Phone: [phone]

## 2021-12-09 NOTE — Telephone Encounter (Signed)
ATC regarding referral  Referral sent to [facility] [place] (main location) Most offices with [facility] are still virtual There are not many Psychiatrists seeing patients in office in the [place] area. The patient will have to call to schedule their own appt.   [facility] at [facility] [address] [place],  [place]  [address] Main: [phone]

## 2021-12-10 NOTE — Telephone Encounter (Signed)
Pt returned call . Would like a call back #[phone]

## 2021-12-10 NOTE — Telephone Encounter (Signed)
Left message for patient to call back  

## 2021-12-11 NOTE — Telephone Encounter (Signed)
Pt called returning your call 

## 2021-12-11 NOTE — Telephone Encounter (Signed)
Called patient again. If patient calls back and I am not available please let patient know the information for the office of [facility]. Patient just needs to call them and get set up, we do not make those type of appointments for patients. Thank you

## 2021-12-12 NOTE — Telephone Encounter (Signed)
Patient called given information will call for appointment.

## 2021-12-16 ENCOUNTER — Telehealth: Payer: Self-pay

## 2021-12-16 NOTE — Telephone Encounter (Signed)
LMTCB

## 2021-12-16 NOTE — Telephone Encounter (Signed)
[facility] Night - Client Nonclinical Telephone Record  [facility] Client [facility] Night - Client Client Site [place] - Night Provider [name]- NP Contact Type Call Who Is Calling Patient / Member / Family / Caregiver [name] Name [place] Phone Number [phone] Call Type Message Only Information Provided Reason for Call Returning a Call from the Office Initial Arab is returning a missed call from [facility] this morning. Disp. Time Disposition Final User 12/13/2021 11:40:22 AM General Information Provided Yes Kathlynn Grate Call Closed By: Kathlynn Grate Transaction Date/Time: 12/13/2021 11:35:39 AM (ET

## 2021-12-17 ENCOUNTER — Telehealth: Payer: Self-pay

## 2021-12-17 NOTE — Telephone Encounter (Signed)
Pt already has appt with Audria Nine NP on 12/19/21. Sending note to Audria Nine NP and Anastasiya CMA.

## 2021-12-17 NOTE — Telephone Encounter (Signed)
[facility] Day - Client TELEPHONE ADVICE RECORD [facility] Patient Name: KALINA MORABITO Gender: Female DOB: May 24, 1976 Age: 46 Y 1 M 5 D Return Phone Number: [phone] (Primary), [phone] (Secondary) Address: [address] City/ State/ Zip: [place] [address] Client [facility] Day - Client Client Site [facility] - Day Provider Audria Nine- NP Contact Type Call Who Is Calling Patient / Member / Family / Caregiver Call Type Triage / Clinical Relationship To Patient Self Return Phone Number [phone] (Primary) Chief Complaint Facial Swelling Reason for Call Symptomatic / Request for Health Information Initial Comment [name] states she has a swollen jaw and wants to know if she can take Ibuprofen combined with her headache medicine. Translation No Nurse Assessment Nurse: Scarlette Ar, RN, Heather Date/Time (Eastern Time): 12/17/2021 12:19:20 PM Confirm and document reason for call. If symptomatic, describe symptoms. ---[name] states she has a swollen jaw and wants to know if she can take Ibuprofen combined with her headache medicine. She is supposed to be wearing a night guard for teeth grinding. She also has some cavities in her teeth. Does the patient have any new or worsening symptoms? ---Yes Will a triage be completed? ---Yes Related visit to physician within the last 2 weeks? ---No Does the PT have any chronic conditions? (i.e. diabetes, asthma, this includes High risk factors for pregnancy, etc.) ---No Is the patient pregnant or possibly pregnant? (Ask all females between the ages of 8-55) ---No Is this a behavioral health or substance abuse call? ---No Guidelines Guideline Title Affirmed Question Affirmed Notes Nurse Date/Time Lamount Cohen Time) Face Swelling Swelling of both lower legs (i.e., bilateral pedal edema) Standifer, RN, Heather 12/17/2021 12:25:37 PM Disp.  Time Lamount Cohen Time) Disposition Final User PLEASE NOTE: All timestamps contained within this report are represented as [place] Standard Time. CONFIDENTIALTY NOTICE: This fax transmission is intended only for the addressee. It contains information that is legally privileged, confidential or otherwise protected from use or disclosure. If you are not the intended recipient, you are strictly prohibited from reviewing, disclosing, copying using or disseminating any of this information or taking any action in reliance on or regarding this information. If you have received this fax in error, please notify us immediately by telephone so that we can arrange for its return to [place]. Phone: [phone], Toll-Free: [phone], Fax: [phone] Page: 2 of 2 Call Id: [id number] 12/17/2021 12:30:05 PM See HCP within 4 Hours (or PCP triage) Yes Standifer, RN, Sibyl Parr Disagree/Comply Disagree [name] Understands Yes PreDisposition Call Doctor Care Advice Given Per Guideline SEE HCP (OR PCP TRIAGE) WITHIN 4 HOURS: * IF OFFICE WILL BE OPEN: You need to be seen within the next 3 or 4 hours. Call your doctor (or NP/PA) now or as soon as the office opens. Comments User: Tyrone Apple, RN Date/Time Lamount Cohen Time): 12/17/2021 12:30:55 PM [name] states that she has an appt coming up with her physician, she will just wait until then. Referrals GO TO FACILITY REFUSE

## 2021-12-17 NOTE — Telephone Encounter (Signed)
Noted  It states she asked if she can take ibuprofen with her headache medication. Do we know if they answered that question?

## 2021-12-18 NOTE — Telephone Encounter (Signed)
Spoke with patient. She was advised that it was ok to take Ibuprofen when she called

## 2021-12-19 ENCOUNTER — Encounter: Payer: Self-pay | Admitting: Nurse Practitioner

## 2021-12-19 ENCOUNTER — Ambulatory Visit (INDEPENDENT_AMBULATORY_CARE_PROVIDER_SITE_OTHER): Payer: 59 | Admitting: Nurse Practitioner

## 2021-12-19 ENCOUNTER — Other Ambulatory Visit: Payer: Self-pay

## 2021-12-19 VITALS — BP 128/94 | HR 79 | Temp 97.4°F | Resp 14 | Ht 67.5 in | Wt 258.1 lb

## 2021-12-19 DIAGNOSIS — Z23 Encounter for immunization: Secondary | ICD-10-CM | POA: Diagnosis not present

## 2021-12-19 DIAGNOSIS — G43109 Migraine with aura, not intractable, without status migrainosus: Secondary | ICD-10-CM | POA: Diagnosis not present

## 2021-12-19 DIAGNOSIS — M26609 Unspecified temporomandibular joint disorder, unspecified side: Secondary | ICD-10-CM | POA: Diagnosis not present

## 2021-12-19 DIAGNOSIS — R051 Acute cough: Secondary | ICD-10-CM | POA: Insufficient documentation

## 2021-12-19 MED ORDER — BENZONATATE 100 MG PO CAPS: 100.0000 mg | ORAL_CAPSULE | Freq: Two times a day (BID) | ORAL | 0 refills | Status: DC | PRN

## 2021-12-19 NOTE — Assessment & Plan Note (Signed)
Patient complains about bilateral ear pain and jaw pain.  Has been evaluated by dentist was recommended she have teeth extraction and denture molded.  Patient is also supposed to be using it teeth guard at night and she has not started using it yet.  States her and her husband are working on finding the appropriate way to get touch with a dentist.  On exam patient does have TMJ to give information in regards to that.  Follow-up with dentist as recommended

## 2021-12-19 NOTE — Assessment & Plan Note (Signed)
Patient still having intermittent cough but seems to improved since the Z-Pak.  She is been using Tustin without relief we will send in Tessalon Perles 100 mg twice daily as needed cough.  Also complaining of still having bloody mucus coming up she has been using Flonase consistently for years it seems per chart.  Told her she needs to back off on the Flonase as is likely coming from the nasal passages after exam.  She acknowledged continue to monitor gave strict instructions if no improvement to follow back up.

## 2021-12-19 NOTE — Patient Instructions (Signed)
Nice to see you today STOP using the flonase for at least a week. Can get some nasal saline for the nose to help If you are still coughing up blood after doing all this let me know  If you have been taking 1 tab in the morning and 2 tablets at night of the headache medicine for at least a week you can go to 2 tablets in the morning and 2 tablets at night

## 2021-12-19 NOTE — Assessment & Plan Note (Signed)
Patient still working on titrating up on topiramate.  Patient states she is having weekly headaches.  Is been daily every day with no change.  She did have a CT and MRI of her head approximate 10 years ago that showed no acute abnormality.  Discussed with patient going to a neurologist or headache specialist patient does not want to do that at the current time did talk about obtaining CT of head to make sure no acute abnormalities patient is amenable to this.  Order placed.  Patient to continue titrating the topiramate and follow-up

## 2021-12-19 NOTE — Progress Notes (Addendum)
Acute Office Visit  Subjective:    Patient ID: Kathy Gamble, female    DOB: 30-Jan-1976, 46 y.o.   MRN: [id number]  Chief Complaint  Patient presents with   Facial Pain    Some swelling on the left jaw and pain, some swelling sensation in the left side of the neck and into left ear. X 2 weeks.  She has been using ice and taking Tylenol. Some jaw pain. Dentist said she needs teeth guard and dentures.     HPI Patient is in today for facial Pain  Patient is a poor historian   States that she has had dental issues and have had it evaluated. That she needs dentures and teeth guard. States that she does not have a teeth guard yet.   States lower jaw pain on both sides. Both ears are hurting. Dyquill for ears and sinus and helps some.  Tylenol or ibuprofen along with ice has helped with jaw  States she is only taking 1 tab of topiramate in the am and 2 tabs in the pm. Still having headaches every day. States makes no difference. She needs to continue to titrate to 50mg  total in the am and 50mg  total in the PM.   Past Medical History:  Diagnosis Date   Allergy    Bee sting allergy 03/02/2015   Chicken pox    Frequent headaches    GERD (gastroesophageal reflux disease)     Past Surgical History:  Procedure Laterality Date   ADENOIDECTOMY     TONSILLECTOMY      Family History  Problem Relation Age of Onset   Stroke Mother    Mental retardation Mother    Cancer Maternal Grandmother    Cancer Paternal Grandmother        Breast   Heart disease Paternal Grandfather     Social History   Socioeconomic History   Marital status: Married    Spouse name: Not on file   Number of children: Not on file   Years of education: Not on file   Highest education level: Not on file  Occupational History   Not on file  Tobacco Use   Smoking status: Never   Smokeless tobacco: Never  Vaping Use   Vaping Use: Never used  Substance and Sexual Activity   Alcohol use: No     Alcohol/week: 0.0 standard drinks   Drug use: Not Currently    Types: Cocaine   Sexual activity: Yes    Birth control/protection: None  Other Topics Concern   Not on file  Social History Narrative   Not on file   Social Determinants of Health   Financial Resource Strain: Not on file  Food Insecurity: Not on file  Transportation Needs: Not on file  Physical Activity: Not on file  Stress: Not on file  Social Connections: Not on file  Intimate Partner Violence: Not on file    Outpatient Medications Prior to Visit  Medication Sig Dispense Refill   albuterol (VENTOLIN HFA) 108 (90 Base) MCG/ACT inhaler INHALE 1-2 PUFFS BY MOUTH into THE lungs EVERY 6 HOURS AS NEEDED FOR SHORTNESS OF BREATH OR wheezing 8.5 g 1   fluticasone (FLONASE) 50 MCG/ACT nasal spray INSTILL 1 SPRAY IN EACH NOSTRIL ONCE DAILY 48 g 3   levocetirizine (XYZAL) 5 MG tablet TAKE 1 TABLET BY MOUTH EVERY DAY IN THE EVENING 90 tablet 0   montelukast (SINGULAIR) 10 MG tablet TAKE ONE TABLET BY MOUTH ONCE DAILY FOR sinuses 90  tablet 3   Multiple Vitamin (MULTIVITAMIN) capsule Take 1 capsule by mouth daily.     omeprazole (PRILOSEC) 20 MG capsule TAKE ONE CAPSULE BY MOUTH ONCE DAILY 90 capsule 3   tobramycin (TOBREX) 0.3 % ophthalmic solution Place 2 drops into the right eye every 6 (six) hours. 5 mL 0   topiramate (TOPAMAX) 25 MG tablet Take 1 tablet (25 mg total) by mouth 2 (two) times daily. 180 tablet 1   No facility-administered medications prior to visit.    Allergies  Allergen Reactions   Bee Venom Itching   Latex Rash    The powder in the gloves causes rash    Review of Systems  Constitutional:  Negative for chills and fever.  HENT:  Positive for dental problem and ear pain (full). Negative for ear discharge and sore throat.   Respiratory:  Positive for cough. Negative for shortness of breath.   Cardiovascular:  Negative for chest pain.      Objective:    Physical Exam Vitals and nursing note  reviewed.  Constitutional:      Appearance: She is obese.  HENT:     Right Ear: Tympanic membrane, ear canal and external ear normal. There is no impacted cerumen.     Left Ear: Tympanic membrane, ear canal and external ear normal. There is no impacted cerumen.     Nose:     Comments: Reddened nasal passages with bloody discharge in the left nare    Mouth/Throat:     Mouth: Mucous membranes are moist.     Dentition: Dental abscesses present.     Tongue: No lesions.     Pharynx: Oropharynx is clear. No posterior oropharyngeal erythema.  Cardiovascular:     Rate and Rhythm: Normal rate and regular rhythm.  Pulmonary:     Effort: Pulmonary effort is normal.     Breath sounds: Normal breath sounds.  Musculoskeletal:     Comments: Popping and discomfort to bilateral TMJ with opening and closing of mouth.  Lymphadenopathy:     Cervical: No cervical adenopathy.  Neurological:     Mental Status: She is alert.    BP (!) 128/94    Pulse 79    Temp (!) 97.4 F (36.3 C)    Resp 14    Ht 5' 7.5" (1.715 m)    Wt 258 lb 2 oz (117.1 kg)    SpO2 97%    BMI 39.83 kg/m  Wt Readings from Last 3 Encounters:  12/19/21 258 lb 2 oz (117.1 kg)  12/03/21 260 lb (117.9 kg)  08/11/21 256 lb 8 oz (116.3 kg)    Health Maintenance Due  Topic Date Due   Pneumococcal Vaccine 57-81 Years old (1 - PCV) Never done   HIV Screening  Never done   Hepatitis C Screening  Never done   PAP SMEAR-Modifier  01/02/2018   COVID-19 Vaccine (4 - Booster for Pfizer series) 02/05/2021   INFLUENZA VACCINE  07/14/2021   COLONOSCOPY (Pts 45-67yrs Insurance coverage will need to be confirmed)  Never done    There are no preventive care reminders to display for this patient.   Lab Results  Component Value Date   TSH 1.83 12/03/2021   Lab Results  Component Value Date   WBC 6.0 12/03/2021   HGB 12.6 12/03/2021   HCT 37.9 12/03/2021   MCV 88.1 12/03/2021   PLT 280.0 12/03/2021   Lab Results  Component Value Date    NA 139 12/03/2021   K  3.9 12/03/2021   CO2 28 12/03/2021   GLUCOSE 85 12/03/2021   BUN 18 12/03/2021   CREATININE 0.69 12/03/2021   BILITOT 0.3 12/03/2021   ALKPHOS 116 12/03/2021   AST 12 12/03/2021   ALT 11 12/03/2021   PROT 6.3 12/03/2021   ALBUMIN 3.7 12/03/2021   CALCIUM 8.7 12/03/2021   GFR 105.08 12/03/2021   Lab Results  Component Value Date   CHOL 141 12/03/2021   Lab Results  Component Value Date   HDL 49.80 12/03/2021   Lab Results  Component Value Date   LDLCALC 76 12/03/2021   Lab Results  Component Value Date   TRIG 76.0 12/03/2021   Lab Results  Component Value Date   CHOLHDL 3 12/03/2021   Lab Results  Component Value Date   HGBA1C 4.9 12/03/2021       Assessment & Plan:   Problem List Items Addressed This Visit       Cardiovascular and Mediastinum   Chronic migraine with aura    Patient still working on titrating up on topiramate.  Patient states she is having weekly headaches.  Is been daily every day with no change.  She did have a CT and MRI of her head approximate 10 years ago that showed no acute abnormality.  Discussed with patient going to a neurologist or headache specialist patient does not want to do that at the current time did talk about obtaining CT of head to make sure no acute abnormalities patient is amenable to this.  Order placed.  Patient to continue titrating the topiramate and follow-up      Relevant Orders   CT HEAD WO CONTRAST (5MM)     Musculoskeletal and Integument   TMJ (temporomandibular joint syndrome)    Patient complains about bilateral ear pain and jaw pain.  Has been evaluated by dentist was recommended she have teeth extraction and denture molded.  Patient is also supposed to be using it teeth guard at night and she has not started using it yet.  States her and her husband are working on finding the appropriate way to get touch with a dentist.  On exam patient does have TMJ to give information in regards to  that.  Follow-up with dentist as recommended        Other   Acute cough - Primary    Patient still having intermittent cough but seems to improved since the Z-Pak.  She is been using Tustin without relief we will send in Tessalon Perles 100 mg twice daily as needed cough.  Also complaining of still having bloody mucus coming up she has been using Flonase consistently for years it seems per chart.  Told her she needs to back off on the Flonase as is likely coming from the nasal passages after exam.  She acknowledged continue to monitor gave strict instructions if no improvement to follow back up.      Relevant Medications   benzonatate (TESSALON) 100 MG capsule     Meds ordered this encounter  Medications   benzonatate (TESSALON) 100 MG capsule    Sig: Take 1 capsule (100 mg total) by mouth 2 (two) times daily as needed for cough.    Dispense:  20 capsule    Refill:  0    Order Specific Question:   Supervising Provider    Answer:   Loura Pardon A [[id number]]   This visit occurred during the SARS-CoV-2 public health emergency.  Safety protocols were in place, including screening questions  prior to the visit, additional usage of staff PPE, and extensive cleaning of exam room while observing appropriate contact time as indicated for disinfecting solutions.    [name], NP

## 2021-12-22 ENCOUNTER — Telehealth: Payer: Self-pay | Admitting: Nurse Practitioner

## 2021-12-22 ENCOUNTER — Encounter: Payer: Self-pay | Admitting: *Deleted

## 2021-12-22 ENCOUNTER — Other Ambulatory Visit: Payer: Self-pay | Admitting: Nurse Practitioner

## 2021-12-22 ENCOUNTER — Encounter: Payer: Self-pay | Admitting: Nurse Practitioner

## 2021-12-22 DIAGNOSIS — G43109 Migraine with aura, not intractable, without status migrainosus: Secondary | ICD-10-CM

## 2021-12-22 NOTE — Telephone Encounter (Signed)
I discussed this with Kathy Gamble. Nothing emergent that needs to happen but it is a vital part of her health and it does need to be collected and resulted. Angelica Chessman states she is going to reach out to the patient

## 2021-12-22 NOTE — Telephone Encounter (Signed)
Called [facility] at [phone] and they had no pap smear for this patient. They doubled checked both systems. I checked with Terri and Levada Dy with [facility] and Janett Billow, [place]- per Janett Billow pap was placed in our lab but Terri does not recall seen this. Levada Dy did say it happened to them too before when pap smear could not be found but was done. When do you want patient to come back for this?

## 2021-12-22 NOTE — Telephone Encounter (Signed)
Can we follow up on the PAP smear done on 12/03/2021  Thanks

## 2021-12-22 NOTE — Addendum Note (Signed)
Addended by: Michela Pitcher on: 12/22/2021 12:27 PM   Modules accepted: Orders

## 2021-12-22 NOTE — Telephone Encounter (Signed)
error 

## 2021-12-22 NOTE — Addendum Note (Signed)
Addended by: Eden Emms on: 12/22/2021 08:48 AM   Modules accepted: Orders

## 2021-12-24 NOTE — Telephone Encounter (Signed)
Left general message asking patient to please call back and ask to speak with me, "Mandy".

## 2021-12-24 NOTE — Telephone Encounter (Signed)
Spoke with patient and apologized, explained what happened and that we will need to set up a repeat PAP smear at no charge to her.    She verbalized understanding and appt scheduled for 02/09/22 at 9am once we are back at [facility].    She also asked me about her GI referral which I was able to encourage her to call them back as they have not been able to get her to set up her GI appt.   Patient thanked me for the call.   I will copy this to billing to ensure we do not charge twice for the PAP.   Thanks.

## 2021-12-25 ENCOUNTER — Other Ambulatory Visit: Payer: Self-pay | Admitting: Nurse Practitioner

## 2021-12-25 ENCOUNTER — Other Ambulatory Visit: Payer: Self-pay | Admitting: Internal Medicine

## 2021-12-25 DIAGNOSIS — R051 Acute cough: Secondary | ICD-10-CM

## 2021-12-25 DIAGNOSIS — J302 Other seasonal allergic rhinitis: Secondary | ICD-10-CM

## 2021-12-25 LAB — PAP IG AND HPV HIGH-RISK: HPV DNA High Risk: NOT DETECTED

## 2021-12-25 NOTE — Telephone Encounter (Signed)
Requested medication (s) are due for refill today: yes  Requested medication (s) are on the active medication list: yes  Last refill:  01/08/21 #90/0  Future visit scheduled: No  Notes to clinic:  Unable to refill per protocol, medication not assigned to the refill protocol.      Requested Prescriptions  Pending Prescriptions Disp Refills   levocetirizine (XYZAL) 5 MG tablet [Pharmacy Med Name: levocetirizine 5 mg tablet] 90 tablet 0    Sig: TAKE ONE TABLET BY MOUTH ONCE DAILY AS NEEDED     There is no refill protocol information for this order

## 2021-12-26 NOTE — Telephone Encounter (Signed)
Additional info to information listed below; Pt had annual on 12/03/21 with [name] NP On 02/09/22 pt has appt with [name] NP for pap smear.

## 2021-12-30 ENCOUNTER — Telehealth: Payer: Self-pay

## 2021-12-30 NOTE — Telephone Encounter (Signed)
Left message for patient to call back.  1-Make sure she saw her pap smear result  2-Cancelled her appointment for repeat pap smear that was scheduled with [facility] in February due to the pap specimen was found. Originally could not find in the system. Nothing further is needed

## 2021-12-31 NOTE — Telephone Encounter (Signed)
Spoke with patient and explained everything. Patient verbalized understanding

## 2021-12-31 NOTE — Telephone Encounter (Signed)
Pt called back requesting to only speak to you

## 2022-01-05 NOTE — Telephone Encounter (Signed)
Please see other message string regarding the Pap Result.   Specimen was located and processed.  Patient made aware by Tiffany Kocher, CMA.

## 2022-01-07 ENCOUNTER — Telehealth: Payer: Self-pay | Admitting: Nurse Practitioner

## 2022-01-07 NOTE — Telephone Encounter (Signed)
Pt called stating that she has 22 pills left of medication topiramate (TOPAMAX) 25 MG tablet, pt is asking do you want her to finish the bottle and refill or do you want her to stop them completely. Pt has been taking them 1 pill a day. Please advise.

## 2022-01-07 NOTE — Telephone Encounter (Signed)
Patient advised. Patient has been taking 1 tablet once daily for a week, reviewed previous mychart message from [place] to the patient in regards to this medication and that we were weaning her off. Patient was advised at this time to go ahead and stop taking this medication.

## 2022-01-22 ENCOUNTER — Telehealth: Payer: Self-pay | Admitting: Gastroenterology

## 2022-01-22 DIAGNOSIS — R519 Headache, unspecified: Secondary | ICD-10-CM | POA: Diagnosis not present

## 2022-01-22 NOTE — Telephone Encounter (Signed)
We did not call patient it was a reminder call to remind them of her appointment

## 2022-01-22 NOTE — Telephone Encounter (Signed)
Pt returning call from earlier today. [phone]

## 2022-01-26 ENCOUNTER — Ambulatory Visit: Payer: Medicare HMO | Admitting: Gastroenterology

## 2022-01-28 ENCOUNTER — Telehealth: Payer: Self-pay

## 2022-01-28 NOTE — Telephone Encounter (Signed)
Patient is ready to schedule colonoscopy please call patient to schedule

## 2022-01-29 NOTE — Telephone Encounter (Signed)
Returned patients call. Unable to leave message. 

## 2022-02-09 ENCOUNTER — Ambulatory Visit: Payer: Medicare HMO | Admitting: Nurse Practitioner

## 2022-02-11 ENCOUNTER — Ambulatory Visit (INDEPENDENT_AMBULATORY_CARE_PROVIDER_SITE_OTHER): Payer: Medicare HMO | Admitting: Nurse Practitioner

## 2022-02-11 ENCOUNTER — Other Ambulatory Visit: Payer: Self-pay

## 2022-02-11 ENCOUNTER — Encounter: Payer: Self-pay | Admitting: Nurse Practitioner

## 2022-02-11 ENCOUNTER — Telehealth: Payer: Self-pay | Admitting: Nurse Practitioner

## 2022-02-11 VITALS — BP 124/92 | HR 87 | Temp 96.8°F | Resp 12 | Ht 67.5 in | Wt 251.4 lb

## 2022-02-11 DIAGNOSIS — L700 Acne vulgaris: Secondary | ICD-10-CM | POA: Insufficient documentation

## 2022-02-11 DIAGNOSIS — G43109 Migraine with aura, not intractable, without status migrainosus: Secondary | ICD-10-CM

## 2022-02-11 NOTE — Telephone Encounter (Signed)
Left message to call back. ?This was not discussed today. Spoke with Alvin, need to find out if patient would like testing done for this or treat OTC for now, any other details. ?

## 2022-02-11 NOTE — Patient Instructions (Addendum)
Nice to see you ?I did not see a rash currently on you ?I want you to take the medicines that are listed on the back. ?I also need you to call and schedule an appointment with Dr. Melrose Nakayama again to talk about your headaches ?Go ahead and schedule your physical for 12/04/2022 ?

## 2022-02-11 NOTE — Assessment & Plan Note (Signed)
Refer patient to neurology for seeing Dr. Theora Master.  Patient saw him 01/22/2022.  Was started on Elavil and titrated up to 20 mg nightly.  Patient still endorses having some headaches.  Did discuss with her medication regimen per Dr. Daisy Blossom last note.  Also he requested a follow-up in 3 weeks.  Patient not made that appointment yet.  Did write down his information and telephone number for patient, so she can make an appointment.  Also discussed the importance of taking medications that are prescribed and how they are prescribed.  Did update patient's medicine list and reviewed it with her verbally in office ?

## 2022-02-11 NOTE — Progress Notes (Signed)
Established Patient Office Visit  Subjective:  Patient ID: Kathy Gamble, female    DOB: 05/29/1976  Age: 46 y.o. MRN: [id number]  CC:  Chief Complaint  Patient presents with   follow up after visit with neurologist    Mentioned MRI of the head but it was not set up    HPI Kathy Gamble presents for Headaches  She was seeing me for migrianes and she had tried topamax in the past with some relief. We restarted her on topamax and titrated her up to 50mg  BID. She had reports of still having headaches. We did discuss getting imaging but upon further review not warranted. I called and discussed this with her and referred her to Neurology. She was seen by neurology on 01/22/2022 by Dr. 03/22/2022. She was evaluated and started on nortriptyline 10mg  for a week and then increase to 20mg  nightly.  States that she is getting headaches that are coming and going. States has a headache now. States she has taken a topamax, nortriptyline so far today. When I questioned her further she states that neurology placed her back on topamax. She also mentions that she will take a gabapentin on occasion to help her relax at night.  States she has noticed places on her breast. States "pimples". States that she nticed a rash on bilateral breast when she was getting out of the shower. States that she does not wear a bra at night. Did mention that it itched. No treatment at home thus far    Past Medical History:  Diagnosis Date   Allergy    Bee sting allergy 03/02/2015   Chicken pox    Frequent headaches    GERD (gastroesophageal reflux disease)     Past Surgical History:  Procedure Laterality Date   ADENOIDECTOMY     TONSILLECTOMY      Family History  Problem Relation Age of Onset   Stroke Mother    Mental retardation Mother    Cancer Maternal Grandmother    Cancer Paternal Grandmother        Breast   Heart disease Paternal Grandfather     Social History   Socioeconomic History   Marital  status: Married    Spouse name: Not on file   Number of children: Not on file   Years of education: Not on file   Highest education level: Not on file  Occupational History   Not on file  Tobacco Use   Smoking status: Never   Smokeless tobacco: Never  Vaping Use   Vaping Use: Never used  Substance and Sexual Activity   Alcohol use: No    Alcohol/week: 0.0 standard drinks   Drug use: Not Currently    Types: Cocaine   Sexual activity: Yes    Birth control/protection: None  Other Topics Concern   Not on file  Social History Narrative   Not on file   Social Determinants of Health   Financial Resource Strain: Not on file  Food Insecurity: Not on file  Transportation Needs: Not on file  Physical Activity: Not on file  Stress: Not on file  Social Connections: Not on file  Intimate Partner Violence: Not on file    Outpatient Medications Prior to Visit  Medication Sig Dispense Refill   albuterol (VENTOLIN HFA) 108 (90 Base) MCG/ACT inhaler INHALE 1-2 PUFFS into THE lungs EVERY 6 HOURS AS NEEDED FOR SHORTNESS OF BREATH OR wheezing 18 g 0   fluticasone (FLONASE) 50 MCG/ACT nasal spray  INSTILL 1 SPRAY IN EACH NOSTRIL ONCE DAILY 48 g 3   levocetirizine (XYZAL) 5 MG tablet TAKE ONE TABLET BY MOUTH ONCE DAILY AS NEEDED 90 tablet 0   montelukast (SINGULAIR) 10 MG tablet TAKE ONE TABLET BY MOUTH ONCE DAILY FOR sinuses 90 tablet 3   Multiple Vitamin (MULTIVITAMIN) capsule Take 1 capsule by mouth daily.     nortriptyline (PAMELOR) 10 MG capsule Start Nortriptyline (Pamelor) 10 mg nightly for one week, then increase to 20 mg nightly     omeprazole (PRILOSEC) 20 MG capsule TAKE ONE CAPSULE BY MOUTH ONCE DAILY 90 capsule 3   tobramycin (TOBREX) 0.3 % ophthalmic solution Place 2 drops into the right eye every 6 (six) hours. 5 mL 0   topiramate (TOPAMAX) 25 MG tablet Take 1 tablet (25 mg total) by mouth 2 (two) times daily. (Patient taking differently: Take 25 mg by mouth. 2 tablets 2 times a  week) 180 tablet 1   benzonatate (TESSALON) 100 MG capsule Take 1 capsule (100 mg total) by mouth 2 (two) times daily as needed for cough. 20 capsule 0   No facility-administered medications prior to visit.    Allergies  Allergen Reactions   Bee Venom Itching   Latex Rash    The powder in the gloves causes rash    ROS Review of Systems  Constitutional:  Negative for chills and fever.  Respiratory:  Negative for shortness of breath.   Cardiovascular:  Negative for chest pain.  Skin:  Positive for rash.  Neurological:  Positive for headaches. Negative for speech difficulty and weakness.     Objective:    Physical Exam Vitals and nursing note reviewed. Exam conducted with a chaperone present [facility], RMA).  Constitutional:      Appearance: She is obese.  Eyes:     Extraocular Movements: Extraocular movements intact.     Pupils: Pupils are equal, round, and reactive to light.  Cardiovascular:     Rate and Rhythm: Normal rate and regular rhythm.     Heart sounds: Normal heart sounds.  Pulmonary:     Effort: Pulmonary effort is normal.     Breath sounds: Normal breath sounds.  Chest:  Breasts:    Right: Normal.     Left: Normal.    Abdominal:     General: Bowel sounds are normal.  Neurological:     General: No focal deficit present.     Mental Status: She is alert.     Motor: No weakness.     Gait: Gait normal.     Deep Tendon Reflexes:     Reflex Scores:      Bicep reflexes are 2+ on the right side and 2+ on the left side.      Patellar reflexes are 2+ on the right side and 2+ on the left side.    Comments: Bilateral upper and lower extremity strength 5/5    BP (!) 124/92    Pulse 87    Temp (!) 96.8 F (36 C)    Resp 12    Ht 5' 7.5" (1.715 m)    Wt 251 lb 7 oz (114.1 kg)    SpO2 100%    BMI 38.80 kg/m  Wt Readings from Last 3 Encounters:  02/11/22 251 lb 7 oz (114.1 kg)  12/19/21 258 lb 2 oz (117.1 kg)  12/03/21 260 lb (117.9 kg)     Health  Maintenance Due  Topic Date Due   HIV Screening  Never done  Hepatitis C Screening  Never done   COVID-19 Vaccine (4 - Booster for Pfizer series) 02/05/2021   COLONOSCOPY (Pts 45-57yrs Insurance coverage will need to be confirmed)  Never done    There are no preventive care reminders to display for this patient.  Lab Results  Component Value Date   TSH 1.83 12/03/2021   Lab Results  Component Value Date   WBC 6.0 12/03/2021   HGB 12.6 12/03/2021   HCT 37.9 12/03/2021   MCV 88.1 12/03/2021   PLT 280.0 12/03/2021   Lab Results  Component Value Date   NA 139 12/03/2021   K 3.9 12/03/2021   CO2 28 12/03/2021   GLUCOSE 85 12/03/2021   BUN 18 12/03/2021   CREATININE 0.69 12/03/2021   BILITOT 0.3 12/03/2021   ALKPHOS 116 12/03/2021   AST 12 12/03/2021   ALT 11 12/03/2021   PROT 6.3 12/03/2021   ALBUMIN 3.7 12/03/2021   CALCIUM 8.7 12/03/2021   GFR 105.08 12/03/2021   Lab Results  Component Value Date   CHOL 141 12/03/2021   Lab Results  Component Value Date   HDL 49.80 12/03/2021   Lab Results  Component Value Date   LDLCALC 76 12/03/2021   Lab Results  Component Value Date   TRIG 76.0 12/03/2021   Lab Results  Component Value Date   CHOLHDL 3 12/03/2021   Lab Results  Component Value Date   HGBA1C 4.9 12/03/2021      Assessment & Plan:   Problem List Items Addressed This Visit       Cardiovascular and Mediastinum   Chronic migraine with aura - Primary    Refer patient to neurology for seeing Dr. Theora Master.  Patient saw him 01/22/2022.  Was started on Elavil and titrated up to 20 mg nightly.  Patient still endorses having some headaches.  Did discuss with her medication regimen per Dr. Daisy Blossom last note.  Also he requested a follow-up in 3 weeks.  Patient not made that appointment yet.  Did write down his information and telephone number for patient, so she can make an appointment.  Also discussed the importance of taking medications that are  prescribed and how they are prescribed.  Did update patient's medicine list and reviewed it with her verbally in office      Relevant Medications   nortriptyline (PAMELOR) 10 MG capsule     Musculoskeletal and Integument   Comedone    No apparent rash under bilateral breasts or back.  Patient did have comedones under left breast where skin touches.  Discussed good hygiene and keeping skin from being moist.  Continue to monitor.       No orders of the defined types were placed in this encounter.   Follow-up: Return if symptoms worsen or fail to improve, for schedule CPE form 12/04/2022 or later.  This visit occurred during the SARS-CoV-2 public health emergency.  Safety protocols were in place, including screening questions prior to the visit, additional usage of staff PPE, and extensive cleaning of exam room while observing appropriate contact time as indicated for disinfecting solutions.     Audria Nine, NP

## 2022-02-11 NOTE — Telephone Encounter (Signed)
Pt called stating she was seen today, but forget to tell Susy Frizzle that she had a sore throat. Please advise. ?

## 2022-02-11 NOTE — Assessment & Plan Note (Signed)
No apparent rash under bilateral breasts or back.  Patient did have comedones under left breast where skin touches.  Discussed good hygiene and keeping skin from being moist.  Continue to monitor. ?

## 2022-02-12 NOTE — Telephone Encounter (Signed)
Left message to call back  

## 2022-02-13 NOTE — Telephone Encounter (Signed)
Reached out to the patient to follow up on this. Not able to get in touch. Will await patients call if this is still an issue and follow up ?FYI to [facility] ?

## 2022-02-13 NOTE — Telephone Encounter (Signed)
Patient called back did want to be seen for sore throat. Made appointment with Tabitha on Monday. Susy Frizzle is not in the office.  ?

## 2022-02-16 ENCOUNTER — Encounter: Payer: Self-pay | Admitting: Family

## 2022-02-16 ENCOUNTER — Ambulatory Visit: Payer: Medicare HMO | Admitting: Family

## 2022-02-16 ENCOUNTER — Telehealth: Payer: Medicare HMO | Admitting: Family

## 2022-02-17 ENCOUNTER — Telehealth: Payer: Medicare HMO | Admitting: Family

## 2022-02-17 ENCOUNTER — Telehealth: Payer: Self-pay | Admitting: Nurse Practitioner

## 2022-02-17 ENCOUNTER — Other Ambulatory Visit: Payer: Self-pay

## 2022-02-17 NOTE — Telephone Encounter (Signed)
Pt called wanting to only speak to you. She wouldn't say what it was about  ?

## 2022-02-17 NOTE — Telephone Encounter (Signed)
Pt wanted to follow up on appt from yesterday, she wanted to know if something can be called in for strep throat  ?

## 2022-02-17 NOTE — Telephone Encounter (Signed)
Left detailed message for patient letting her know that I am in and out with patients and to please let who answers the phone when she calls back know what her question is, what the call is about. I am not here after 1 pm today. ?

## 2022-02-18 NOTE — Telephone Encounter (Signed)
Aware. Patient has missed VV on 3/6 and 3/7, patient was advised she needs an appointment to treat her symptoms on 02/17/22. Will access on 02/20/22. ?

## 2022-02-18 NOTE — Telephone Encounter (Signed)
Please see notes below the access nurse note; per appt notes pt already has appt with Audria Nine NP on 02/20/22 9:20. Sending note to [facility] CMA. ? ? ? ?[facility] Night - Client ?Nonclinical Telephone Record  ?AccessNurse? ?Client [facility] Night - Client ?Client Site [facility] - Night ?Provider Kerby Nora - MD ?Contact Type Call ?Who Is Calling Patient / Member / Family / Caregiver ?[name] Name Sister Carbone ?[name] Phone Number [phone] ?Call Type Message Only Information Provided ?Reason for Call Returning a Call from the Office ?Initial Comment [name] states she wants to speak to nurse ?Additional Comment States she's been feeling bad for the past weak, has had a cough and ears are bothering ?Disp. Time Disposition Final User ?02/18/2022 1:24:03 PM General Information Provided Yes Saxon, Chasity ?Call Closed By: Earleen Reaper ?Transaction Date/Time: 02/18/2022 1:21:11 PM (ET ?

## 2022-02-20 ENCOUNTER — Ambulatory Visit (INDEPENDENT_AMBULATORY_CARE_PROVIDER_SITE_OTHER): Payer: Medicare HMO | Admitting: Family Medicine

## 2022-02-20 ENCOUNTER — Encounter: Payer: Self-pay | Admitting: Family Medicine

## 2022-02-20 ENCOUNTER — Other Ambulatory Visit: Payer: Self-pay

## 2022-02-20 ENCOUNTER — Ambulatory Visit: Payer: Medicare HMO | Admitting: Nurse Practitioner

## 2022-02-20 VITALS — BP 130/70 | HR 75 | Temp 98.3°F | Ht 67.5 in | Wt 252.6 lb

## 2022-02-20 DIAGNOSIS — R300 Dysuria: Secondary | ICD-10-CM | POA: Insufficient documentation

## 2022-02-20 DIAGNOSIS — J329 Chronic sinusitis, unspecified: Secondary | ICD-10-CM | POA: Insufficient documentation

## 2022-02-20 DIAGNOSIS — J0140 Acute pansinusitis, unspecified: Secondary | ICD-10-CM | POA: Diagnosis not present

## 2022-02-20 LAB — POCT URINALYSIS DIPSTICK
Bilirubin, UA: NEGATIVE
Blood, UA: NEGATIVE
Glucose, UA: NEGATIVE
Ketones, UA: NEGATIVE
Nitrite, UA: NEGATIVE
Protein, UA: NEGATIVE
Spec Grav, UA: 1.01 (ref 1.010–1.025)
Urobilinogen, UA: 0.2 E.U./dL
pH, UA: 5.5 (ref 5.0–8.0)

## 2022-02-20 MED ORDER — AMOXICILLIN-POT CLAVULANATE 875-125 MG PO TABS: 1.0000 | ORAL_TABLET | Freq: Two times a day (BID) | ORAL | 0 refills | Status: DC

## 2022-02-20 NOTE — Progress Notes (Signed)
Pt was not seen  ?Erroneous encounter ?

## 2022-02-20 NOTE — Assessment & Plan Note (Signed)
Check urinalysis. 

## 2022-02-20 NOTE — Assessment & Plan Note (Signed)
I suspect the patient likely had COVID to start with given that her son is COVID.  At this point she has had symptoms for so long that is more likely a bacterial sinus infection.  We will treat with Augmentin.  She will monitor for diarrhea with this.  She will eat yogurt while on this to possibly reduce the risk of having diarrhea.  If she is not improving with this she will let us know. ?

## 2022-02-20 NOTE — Progress Notes (Signed)
?Kathy Alar, MD ?Phone: [phone] ? ?Kathy Gamble is a 46 y.o. female who presents today for same day visit.  ? ?Upper respiratory illness: Patient notes onset of this about 2 weeks ago.  Based on review of the chart it certainly at least been going on since 02/11/2022.  She reports having had a negative point-of-care COVID test at her primary care office.  She has had some nonproductive cough.  She feels congestion in her sinuses.  She notes she is not able to get anything out of her sinuses.  No fever.  No taste or smell disturbances.  She has fatigue.  Minimal dyspnea.  She does note her son tested positive for COVID last week. ? ?At the end of the visit as the patient was receiving her paperwork she noted that she has been having some dysuria. ? ?Social History  ? ?Tobacco Use  ?Smoking Status Never  ?Smokeless Tobacco Never  ? ? ?Current Outpatient Medications on File Prior to Visit  ?Medication Sig Dispense Refill  ?? albuterol (VENTOLIN HFA) 108 (90 Base) MCG/ACT inhaler INHALE 1-2 PUFFS into THE lungs EVERY 6 HOURS AS NEEDED FOR SHORTNESS OF BREATH OR wheezing 18 g 0  ?? fluticasone (FLONASE) 50 MCG/ACT nasal spray INSTILL 1 SPRAY IN EACH NOSTRIL ONCE DAILY 48 g 3  ?? levocetirizine (XYZAL) 5 MG tablet TAKE ONE TABLET BY MOUTH ONCE DAILY AS NEEDED 90 tablet 0  ?? montelukast (SINGULAIR) 10 MG tablet TAKE ONE TABLET BY MOUTH ONCE DAILY FOR sinuses 90 tablet 3  ?? Multiple Vitamin (MULTIVITAMIN) capsule Take 1 capsule by mouth daily.    ?? nortriptyline (PAMELOR) 10 MG capsule Start Nortriptyline (Pamelor) 10 mg nightly for one week, then increase to 20 mg nightly    ?? omeprazole (PRILOSEC) 20 MG capsule TAKE ONE CAPSULE BY MOUTH ONCE DAILY 90 capsule 3  ? ?No current facility-administered medications on file prior to visit.  ? ? ? ?ROS see history of present illness ? ?Objective ? ?Physical Exam ?Vitals:  ? 02/20/22 1157  ?BP: 130/70  ?Pulse: 75  ?Temp: 98.3 ?F (36.8 ?C)  ?SpO2: 99%  ? ? ?BP Readings  from Last 3 Encounters:  ?02/20/22 130/70  ?02/11/22 (!) 124/92  ?12/19/21 (!) 128/94  ? ?Wt Readings from Last 3 Encounters:  ?02/20/22 252 lb 9.6 oz (114.6 kg)  ?02/16/22 251 lb (113.9 kg)  ?02/11/22 251 lb 7 oz (114.1 kg)  ? ? ?Physical Exam ?Constitutional:   ?   General: She is not in acute distress. ?   Appearance: She is not diaphoretic.  ?HENT:  ?   Head:  ?   Comments: Maxillary and frontal sinuses tender to percussion ?   Ears:  ?   Comments: No purulent fluid behind either TM, she does have some clear fluid behind her TMs ?Cardiovascular:  ?   Rate and Rhythm: Normal rate and regular rhythm.  ?   Heart sounds: Normal heart sounds.  ?Pulmonary:  ?   Effort: Pulmonary effort is normal.  ?   Breath sounds: Normal breath sounds.  ?Skin: ?   General: Skin is warm and dry.  ?Neurological:  ?   Mental Status: She is alert.  ? ? ? ?Assessment/Plan: Please see individual problem list. ? ?Problem List Items Addressed This Visit   ? ? Dysuria - Primary  ?  Check urinalysis. ?  ?  ? Relevant Orders  ? POCT Urinalysis Dipstick  ? Sinusitis  ?  I suspect the patient likely  had COVID to start with given that her son is COVID.  At this point she has had symptoms for so long that is more likely a bacterial sinus infection.  We will treat with Augmentin.  She will monitor for diarrhea with this.  She will eat yogurt while on this to possibly reduce the risk of having diarrhea.  If she is not improving with this she will let us know. ?  ?  ? Relevant Medications  ? amoxicillin-clavulanate (AUGMENTIN) 875-125 MG tablet  ? ? ?Return if symptoms worsen or fail to improve. ? ?This visit occurred during the SARS-CoV-2 public health emergency.  Safety protocols were in place, including screening questions prior to the visit, additional usage of staff PPE, and extensive cleaning of exam room while observing appropriate contact time as indicated for disinfecting solutions.  ? ? ?Kathy Alar, MD ?[facility]  [place] ? ?

## 2022-02-20 NOTE — Patient Instructions (Signed)
Nice to see you. ?If you are not improving with the Augmentin please let us know. ?

## 2022-02-21 LAB — URINE CULTURE
MICRO NUMBER:: 13115635
SPECIMEN QUALITY:: ADEQUATE

## 2022-02-25 DIAGNOSIS — R519 Headache, unspecified: Secondary | ICD-10-CM | POA: Diagnosis not present

## 2022-03-16 ENCOUNTER — Other Ambulatory Visit: Payer: Self-pay

## 2022-03-16 ENCOUNTER — Emergency Department
Admission: EM | Admit: 2022-03-16 | Discharge: 2022-03-16 | Disposition: A | Discharge status: Home / Self Care | Payer: Medicare HMO | Attending: Emergency Medicine | Admitting: Emergency Medicine

## 2022-03-16 DIAGNOSIS — R519 Headache, unspecified: Secondary | ICD-10-CM | POA: Insufficient documentation

## 2022-03-16 DIAGNOSIS — H938X9 Other specified disorders of ear, unspecified ear: Secondary | ICD-10-CM | POA: Insufficient documentation

## 2022-03-16 DIAGNOSIS — R0981 Nasal congestion: Secondary | ICD-10-CM | POA: Insufficient documentation

## 2022-03-16 DIAGNOSIS — R040 Epistaxis: Secondary | ICD-10-CM | POA: Insufficient documentation

## 2022-03-16 MED ORDER — OXYMETAZOLINE HCL 0.05 % NA SOLN: 2.0000 | Freq: Two times a day (BID) | NASAL | 1 refills | Status: DC

## 2022-03-16 NOTE — ED Provider Notes (Signed)
? ?[facility] ?Provider Note ? ?Patient Contact: 6:42 PM (approximate) ? ? ?History  ? ?Epistaxis ? ? ?HPI ? ?Kathy Gamble is a 46 y.o. female who presents the emergency department complaining of a nosebleed earlier today.  She states that she has been having some congestion, headache, ear fullness.  She states that she is supposed to be on Flonase but her doctor told her not to use Flonase if she started getting nosebleeds.  Patient has been able to control with nosebleed without difficulty at home.  She presents for evaluation with no other concerning symptoms at this time. ?  ? ? ?Physical Exam  ? ?Triage Vital Signs: ?ED Triage Vitals  ?Enc Vitals Group  ?   BP 03/16/22 1829 (!) 167/105  ?   Pulse Rate 03/16/22 1829 79  ?   Resp 03/16/22 1829 14  ?   Temp 03/16/22 1829 98.3 ?F (36.8 ?C)  ?   Temp Source 03/16/22 1829 Oral  ?   SpO2 03/16/22 1829 100 %  ?   Weight --   ?   Height --   ?   Head Circumference --   ?   Peak Flow --   ?   Pain Score 03/16/22 1838 0  ?   Pain Loc --   ?   Pain Edu? --   ?   Excl. in GC? --   ? ? ?Most recent vital signs: ?Vitals:  ? 03/16/22 1829  ?BP: (!) 167/105  ?Pulse: 79  ?Resp: 14  ?Temp: 98.3 ?F (36.8 ?C)  ?SpO2: 100%  ? ? ? ?General: Alert and in no acute distress. ?ENT: ?     Ears:  ?     Nose: No congestion/rhinnorhea.  No active epistaxis.  Slight congealed blood/scabbing to the left medial nares.  Again no active bleeding. ?     Mouth/Throat: Mucous membranes are moist. ?Cardiovascular:  Good peripheral perfusion ?Respiratory: Normal respiratory effort without tachypnea or retractions. Lungs CTAB.  ?Musculoskeletal: Full range of motion to all extremities.  ?Neurologic:  No gross focal neurologic deficits are appreciated.  ?Skin:   No rash noted ?Other: ? ? ?ED Results / Procedures / Treatments  ? ?Labs ?(all labs ordered are listed, but only abnormal results are displayed) ?Labs Reviewed - No data to display ? ? ?EKG ? ? ? ? ?RADIOLOGY ? ? ? ?No  results found. ? ?PROCEDURES: ? ?Critical Care performed: No ? ?Procedures ? ? ?MEDICATIONS ORDERED IN ED: ?Medications - No data to display ? ? ?IMPRESSION / MDM / ASSESSMENT AND PLAN / ED COURSE  ?I reviewed the triage vital signs and the nursing notes. ?             ?               ? ?Differential diagnosis includes, but is not limited to, epistaxis, allergic rhinitis, sinusitis ? ? ?Patient's diagnosis is consistent with epistaxis.  Patient presents to the emergency department complaining of mild amount of epistaxis earlier today.  Patient does have significant allergies, states that she has been told not to use her Flonase if she starts to have nosebleeds.  Today she started having some dripping from her nose, noticed that it was blood.  This was controlled easily.  No concerning signs and symptoms, no concerning findings on physical exam.  Patient will have Afrin to help control any return of epistaxis.  Otherwise follow-up primary care as needed.  Return precautions  discussed with the patient.. Patient is given ED precautions to return to the ED for any worsening or new symptoms. ? ? ? ?  ? ? ?FINAL CLINICAL IMPRESSION(S) / ED DIAGNOSES  ? ?Final diagnoses:  ?Anterior epistaxis  ? ? ? ?Rx / DC Orders  ? ?ED Discharge Orders   ? ?      Ordered  ?  oxymetazoline (AFRIN) 0.05 % nasal spray  2 times daily       ? 03/16/22 1842  ? ?  ?  ? ?  ? ? ? ?Note:  This document was prepared using Dragon voice recognition software and may include unintentional dictation errors. ?  ?Racheal Patches, PA-C ?03/16/22 1844 ? ?  ?Merwyn Katos, MD ?03/17/22 1853 ? ?

## 2022-03-16 NOTE — ED Triage Notes (Signed)
Pt comes with c/o nose bleed that started today while she was outside sweeping. Pt states ringing in her ears. Pt states her allergies are acting up as well.  ? ?Nose is not bleeding at this time. ?

## 2022-03-17 ENCOUNTER — Telehealth: Payer: Self-pay

## 2022-03-17 NOTE — Telephone Encounter (Signed)
[facility] Night - Client ?TELEPHONE ADVICE RECORD ?AccessNurse? ?Patient ?Name: ?Kathy Gamble ?E ?Gender: Female ?DOB: Sep 22, 1976 ?Age: 46 Y 4 M 4 D ?Return ?Phone ?Number: ?[id number] ?(Primary), ?[id number] ?(Secondary) ?Address: ?City/ ?State/ ?Zip: ?[place] ?[address] ?Client [facility] Night - Client ?Client Site [facility] - Night ?Provider Audria Nine- NP ?Contact Type Call ?Who Is Calling Patient / Member / Family / Caregiver ?Call Type Triage / Clinical ?Relationship To Patient Self ?Return Phone Number [phone] (Primary) ?Chief Complaint Nosebleed ?Reason for Call Symptomatic / Request for Health Information ?Initial Comment [name] states her nose is bleeding. She does not ?when it started. She is not bleeding anymore nor ?her fingers on her left hand. She not sure why her ?finger were bleeding either. ?GOTO Facility Not [facility] ?Translation No ?Nurse Assessment ?Nurse: Rennis Petty, RN, Clydie Braun Date/Time Lamount Cohen Time): 03/16/2022 5:14:45 PM ?Confirm and document reason for call. If ?symptomatic, describe symptoms. ?---[name] states her nose was bleeding. She does not ?know when it started. She is not bleeding anymore ?nor her fingers on her left hand. She not sure why her ?finger were bleeding either. There was bleeding at the ?underneath the bed of her nail. It was three fingers. ?Not on blood thinners. Bleeding lasted about maybe 10 ?min. There was just a little blood. Had a bad HA after ?that is still there after Tylenol. ?Does the patient have any new or worsening ?symptoms? ---Yes ?Will a triage be completed? ---Yes ?Related visit to physician within the last 2 weeks? ---No ?Does the PT have any chronic conditions? (i.e. ?diabetes, asthma, this includes High risk factors for ?pregnancy, etc.) ?---Yes ?List chronic conditions. ---allergic to powdered gloves, allergies ?Is the patient pregnant or possibly pregnant?  (Ask ?all females between the ages of 27-55) ---No ?Is this a behavioral health or substance abuse call? ---No ?PLEASE NOTE: All timestamps contained within this report are represented as [place] Standard Time. ?CONFIDENTIALTY NOTICE: This fax transmission is intended only for the addressee. It contains information that is legally privileged, confidential or ?otherwise protected from use or disclosure. If you are not the intended recipient, you are strictly prohibited from reviewing, disclosing, copying using ?or disseminating any of this information or taking any action in reliance on or regarding this information. If you have received this fax in error, please ?notify us immediately by telephone so that we can arrange for its return to [place]. Phone: [phone], Toll-Free: [phone], Fax: [phone] ?Page: 2 of 2 ?Call Id: [id number] ?Guidelines ?Guideline Title Affirmed Question Affirmed Notes Nurse Date/Time (Eastern ?Time) ?Headache [1] Numbness of the ?face, arm or leg on ?one side of the body ?AND [2] new-onset ?Rennis Petty, RN, Clydie Braun 03/16/2022 5:19:20 PM ?Disp. Time (Eastern ?Time) Disposition Final User ?03/16/2022 5:31:52 PM 911 Outcome Documentation Rennis Petty, RN, Clydie Braun ?Reason: Pt is trying to call 911 now. ?Her husband is there with her. ?03/16/2022 5:25:15 PM Call EMS 911 Now Yes Rennis Petty, RN, Clydie Braun ?[name] Disagree/Comply Comply ?[name] Understands Yes ?PreDisposition InappropriateToAsk ?Care Advice Given Per Guideline ?CALL EMS 911 NOW: * Immediate medical attention is needed. You need to hang up and call 911 (or an ambulance). CARE ?ADVICE given per Headache (Adult) guideline. ?Referrals ?GO TO FACILITY OTHER - SPECIF ?

## 2022-03-17 NOTE — Telephone Encounter (Signed)
I left another v/m requesting pt to cb to [facility]. ?

## 2022-03-17 NOTE — Telephone Encounter (Signed)
I do not see any labs drawn in the ED either. I see that they gave her afrin for the nose bleed. Her blood pressure was elevated in the ED but the note is not official yet it doesn't seem ?

## 2022-03-17 NOTE — Telephone Encounter (Signed)
Patient called, states she was seen in ED, but they only prescribed her nose spray, she said that they told her that her electrolytes was low and to drink water, but she wants to know if there is anything else that she can do ? ?Please call the patient [phone] ? ? ?

## 2022-03-17 NOTE — Telephone Encounter (Signed)
I spoke with pt;pt said they did not draw blood at the ED today. When I asked pt what she was told at ED she said they told her she had a sinus flair up but did not give her any med. Pt said when she left the ED today she felt like she was in a dream and she found a scratch at the bottom part near vagina that was about as long as her little finger and today is first day she has noticed the scratch but pt said she had to have gotten the scratch when exam was done at physical. Pt could not remember date and the last annual was 12/03/2021. Pt said she is not blaming anyone but that is when it would have happened.pt said she also has a H/A for 2 days. Pain level 8; pt said that nose bleed she had earlier was about 10 mins in duration and pt did not clamp her nose the nosebleed just stopped on its own. Pt said she had blood on her fingers and there was a scratch on her lt ring finger but pt thinks most of the blood was from her nose. Offered to schedule appt for pt and when I put pt on hold for a min and then went back to phone there was silence but call was still going on. I tried pts phone and pts husband phone and no answer. Pts father (DPR signed) said he would try to call pt and have her call me back.pt called back and apologized for the phone cutting [place] off and the phone disconnected again and I could not get pt back on phone.I finally called pt and got her on husbands phone and pt scheduled appt with [name] NP on 03/18/22 at 9:00 AM; UC & ED precautions given and pt voiced understanding/ pt said she was going to rest and has taken tylenol for H/A. Sending note to Jannette Spanner NP, and Anastasiya CMA. ?

## 2022-03-17 NOTE — Telephone Encounter (Signed)
Unable to reach pt by phone and left v/m for pt to cb. Do not see where labs were drawn at hospital. Sending note to Audria Nine NP and Anastasiya CMA. ?

## 2022-03-17 NOTE — Telephone Encounter (Signed)
Per chart review tab pt went to [facility] ED on 03/16/22. Sending note to Audria Nine NP. ?

## 2022-03-18 ENCOUNTER — Emergency Department
Admission: EM | Admit: 2022-03-18 | Discharge: 2022-03-18 | Disposition: A | Discharge status: Home / Self Care | Payer: Medicare HMO | Attending: Student in an Organized Health Care Education/Training Program | Admitting: Student in an Organized Health Care Education/Training Program

## 2022-03-18 ENCOUNTER — Other Ambulatory Visit: Payer: Self-pay

## 2022-03-18 ENCOUNTER — Encounter: Payer: Self-pay | Admitting: Emergency Medicine

## 2022-03-18 ENCOUNTER — Emergency Department: Payer: Medicare HMO

## 2022-03-18 ENCOUNTER — Ambulatory Visit (INDEPENDENT_AMBULATORY_CARE_PROVIDER_SITE_OTHER): Payer: Medicare HMO | Admitting: Nurse Practitioner

## 2022-03-18 ENCOUNTER — Ambulatory Visit (INDEPENDENT_AMBULATORY_CARE_PROVIDER_SITE_OTHER)
Admission: RE | Admit: 2022-03-18 | Discharge: 2022-03-18 | Disposition: A | Discharge status: Home / Self Care | Payer: Medicare HMO | Source: Ambulatory Visit | Attending: Nurse Practitioner | Admitting: Nurse Practitioner

## 2022-03-18 ENCOUNTER — Encounter: Payer: Self-pay | Admitting: Nurse Practitioner

## 2022-03-18 ENCOUNTER — Telehealth: Payer: Self-pay

## 2022-03-18 VITALS — BP 142/100 | HR 87 | Temp 96.9°F | Resp 14 | Ht 67.5 in | Wt 254.4 lb

## 2022-03-18 DIAGNOSIS — L700 Acne vulgaris: Secondary | ICD-10-CM

## 2022-03-18 DIAGNOSIS — M25561 Pain in right knee: Secondary | ICD-10-CM

## 2022-03-18 DIAGNOSIS — R040 Epistaxis: Secondary | ICD-10-CM | POA: Diagnosis not present

## 2022-03-18 DIAGNOSIS — Y92009 Unspecified place in unspecified non-institutional (private) residence as the place of occurrence of the external cause: Secondary | ICD-10-CM | POA: Diagnosis not present

## 2022-03-18 DIAGNOSIS — S8002XA Contusion of left knee, initial encounter: Secondary | ICD-10-CM | POA: Insufficient documentation

## 2022-03-18 DIAGNOSIS — S8992XA Unspecified injury of left lower leg, initial encounter: Secondary | ICD-10-CM | POA: Diagnosis present

## 2022-03-18 DIAGNOSIS — R4182 Altered mental status, unspecified: Secondary | ICD-10-CM | POA: Insufficient documentation

## 2022-03-18 DIAGNOSIS — W19XXXA Unspecified fall, initial encounter: Secondary | ICD-10-CM | POA: Insufficient documentation

## 2022-03-18 DIAGNOSIS — S8001XA Contusion of right knee, initial encounter: Secondary | ICD-10-CM | POA: Diagnosis not present

## 2022-03-18 DIAGNOSIS — I10 Essential (primary) hypertension: Secondary | ICD-10-CM | POA: Diagnosis not present

## 2022-03-18 DIAGNOSIS — S8000XA Contusion of unspecified knee, initial encounter: Secondary | ICD-10-CM

## 2022-03-18 DIAGNOSIS — R9431 Abnormal electrocardiogram [ECG] [EKG]: Secondary | ICD-10-CM | POA: Diagnosis not present

## 2022-03-18 DIAGNOSIS — S199XXA Unspecified injury of neck, initial encounter: Secondary | ICD-10-CM | POA: Diagnosis not present

## 2022-03-18 DIAGNOSIS — M542 Cervicalgia: Secondary | ICD-10-CM | POA: Diagnosis not present

## 2022-03-18 LAB — URINALYSIS, ROUTINE W REFLEX MICROSCOPIC
Bilirubin Urine: NEGATIVE
Glucose, UA: NEGATIVE mg/dL
Hgb urine dipstick: NEGATIVE
Ketones, ur: NEGATIVE mg/dL
Leukocytes,Ua: NEGATIVE
Nitrite: NEGATIVE
Protein, ur: NEGATIVE mg/dL
Specific Gravity, Urine: 1.003 — ABNORMAL LOW (ref 1.005–1.030)
pH: 7 (ref 5.0–8.0)

## 2022-03-18 LAB — CBC WITH DIFFERENTIAL/PLATELET
Abs Immature Granulocytes: 0.01 10*3/uL (ref 0.00–0.07)
Basophils Absolute: 0 10*3/uL (ref 0.0–0.1)
Basophils Relative: 1 %
Eosinophils Absolute: 0.1 10*3/uL (ref 0.0–0.5)
Eosinophils Relative: 2 %
HCT: 37 % (ref 36.0–46.0)
Hemoglobin: 12.3 g/dL (ref 12.0–15.0)
Immature Granulocytes: 0 %
Lymphocytes Relative: 42 %
Lymphs Abs: 2.6 10*3/uL (ref 0.7–4.0)
MCH: 29.1 pg (ref 26.0–34.0)
MCHC: 33.2 g/dL (ref 30.0–36.0)
MCV: 87.5 fL (ref 80.0–100.0)
Monocytes Absolute: 0.3 10*3/uL (ref 0.1–1.0)
Monocytes Relative: 5 %
Neutro Abs: 3.1 10*3/uL (ref 1.7–7.7)
Neutrophils Relative %: 50 %
Platelets: 242 10*3/uL (ref 150–400)
RBC: 4.23 MIL/uL (ref 3.87–5.11)
RDW: 13.4 % (ref 11.5–15.5)
WBC: 6.2 10*3/uL (ref 4.0–10.5)
nRBC: 0 % (ref 0.0–0.2)

## 2022-03-18 LAB — COMPREHENSIVE METABOLIC PANEL
ALT: 13 U/L (ref 0–44)
AST: 14 U/L — ABNORMAL LOW (ref 15–41)
Albumin: 3.3 g/dL — ABNORMAL LOW (ref 3.5–5.0)
Alkaline Phosphatase: 103 U/L (ref 38–126)
Anion gap: 5 (ref 5–15)
BUN: 14 mg/dL (ref 6–20)
CO2: 27 mmol/L (ref 22–32)
Calcium: 8.4 mg/dL — ABNORMAL LOW (ref 8.9–10.3)
Chloride: 109 mmol/L (ref 98–111)
Creatinine, Ser: 0.62 mg/dL (ref 0.44–1.00)
GFR, Estimated: >60 mL/min (ref >60)
Glucose, Bld: 96 mg/dL (ref 70–99)
Potassium: 3.8 mmol/L (ref 3.5–5.1)
Sodium: 141 mmol/L (ref 135–145)
Total Bilirubin: 0.4 mg/dL (ref 0.3–1.2)
Total Protein: 6.3 g/dL — ABNORMAL LOW (ref 6.5–8.1)

## 2022-03-18 LAB — TROPONIN I (HIGH SENSITIVITY): Troponin I (High Sensitivity): 3 ng/L (ref <18)

## 2022-03-18 LAB — POCT URINE PREGNANCY: Preg Test, Ur: NEGATIVE

## 2022-03-18 LAB — LIPASE, BLOOD: Lipase: 29 U/L (ref 11–51)

## 2022-03-18 MED ORDER — AMLODIPINE BESYLATE 5 MG PO TABS: 5.0000 mg | ORAL_TABLET | Freq: Every day | ORAL | 1 refills | Status: DC

## 2022-03-18 NOTE — Patient Instructions (Signed)
I am going to start you on a blood pressure medication (amlodipine) you will take 1 tablet once a day ?Check your blood pressure daily over the next week and write it down, if you do that we do not need another office visit ?Follow up with Dr. Malvin Johns as scheduled ?

## 2022-03-18 NOTE — Telephone Encounter (Signed)
Noted thank you

## 2022-03-18 NOTE — Telephone Encounter (Signed)
Patient saw in office today.

## 2022-03-18 NOTE — Discharge Instructions (Addendum)
Follow-up with your primary care provider at [facility] to see about a MRI of your neck to evaluate areas that were seen on your CT scan.  Continue seeing Dr. Melrose Nakayama.  Continue medications as prescribed by your primary care provider. ?

## 2022-03-18 NOTE — ED Provider Notes (Signed)
? ?[facility] ?Provider Note ? ? ? Event Date/Time  ? First MD Initiated Contact with Patient 03/18/22 1127   ?  (approximate) ? ? ?History  ? ?Fall ? ? ?HPI ? ?Kathy Gamble is a 46 y.o. female   patient is brought to the ED by her husband after being seen at her PCPs office at [facility].  Patient states that she fail Saturday and is unsure whether or not she hit her head.  She denies any LOC.  Husband is present and states that he was at home lying on the bed at the time that this happened.  She denies any nausea, vomiting, visual changes or headache.  She is aware that she has a bruise to her left lower extremity and also that her right knee hurts.  She states that her right knee was evaluated at [facility] and she was told that it was not fractured.  Patient is not completely sure why they sent her to the emergency department from her PCPs office.  Past medical history includes chronic migraine with aura, cognitive dysfunction, reflux, and seasonal allergies.  Patient rates her knee pain as an 8 out of 10. ? ?  ? ? ?Physical Exam  ? ?Triage Vital Signs: ?ED Triage Vitals  ?Enc Vitals Group  ?   BP 03/18/22 1122 (!) 145/80  ?   Pulse Rate 03/18/22 1122 70  ?   Resp 03/18/22 1122 18  ?   Temp 03/18/22 1122 98.1 ?F (36.7 ?C)  ?   Temp Source 03/18/22 1122 Oral  ?   SpO2 03/18/22 1122 99 %  ?   Weight 03/18/22 1119 254 lb 6 oz (115.4 kg)  ?   Height 03/18/22 1119 5' 7.5" (1.715 m)  ?   Head Circumference --   ?   Peak Flow --   ?   Pain Score 03/18/22 1119 8  ?   Pain Loc --   ?   Pain Edu? --   ?   Excl. in GC? --   ? ? ?Most recent vital signs: ?Vitals:  ? 03/18/22 1230 03/18/22 1415  ?BP: 134/81 132/87  ?Pulse: 76 77  ?Resp: 18 20  ?Temp:    ?SpO2: 100% 99%  ? ? ? ?General: Awake, no distress.  Patient is able to answer questions appropriately.  No change in speech.  Patient is ambulatory without any assistance. ?CV:  Good peripheral perfusion.  Heart regular rate and  rhythm. ?Resp:  Normal effort.  Lungs are clear bilaterally. ?Abd:  No distention.  ?Other:  PERRLA, EOMI's, no facial trauma noted.  No tenderness on palpation of the cervical spine posteriorly.  No scalp abrasions or tenderness is appreciated.  Patient is able move upper extremities without any difficulty.  There is no tenderness on palpation of the thoracic or lumbar spine.  Patient is able to move lower extremities without any difficulty and is ambulatory without any assistance.  She does have a ecchymotic area to the inner left lower extremity just below the knee.  No abrasions or open wounds or seen. ? ? ?ED Results / Procedures / Treatments  ? ?Labs ?(all labs ordered are listed, but only abnormal results are displayed) ?Labs Reviewed  ?URINALYSIS, ROUTINE W REFLEX MICROSCOPIC - Abnormal; Notable for the following components:  ?    Result Value  ? Color, Urine COLORLESS (*)   ? APPearance CLEAR (*)   ? Specific Gravity, Urine 1.003 (*)   ?  All other components within normal limits  ?COMPREHENSIVE METABOLIC PANEL - Abnormal; Notable for the following components:  ? Calcium 8.4 (*)   ? Total Protein 6.3 (*)   ? Albumin 3.3 (*)   ? AST 14 (*)   ? All other components within normal limits  ?CBC WITH DIFFERENTIAL/PLATELET  ?LIPASE, BLOOD  ?TROPONIN I (HIGH SENSITIVITY)  ?TROPONIN I (HIGH SENSITIVITY)  ? ? ? ?EKG ? ?Normal sinus rhythm ?Vent. rate 64 BPM ?PR interval 162 ms ?QRS duration 86 ms ?QT/QTcB 416/429 ms ?P-R-T axes 28 -42 71 ? ?RADIOLOGY ?CT head and cervical spine was reviewed and radiology report read.  No evidence of acute hemorrhage or mass noted on exam. ?CT cervical spine radiology reports no acute fracture however there are some lucencies in the vertebra of C3, C4, C5, and C7.  There is also noted degenerative disc disease.  Radiology suggest a outpatient MRI to evaluate these areas. ? ? ?PROCEDURES: ? ?Critical Care performed:  ? ?Procedures ? ? ?MEDICATIONS ORDERED IN ED: ?Medications - No data  to display ? ? ?IMPRESSION / MDM / ASSESSMENT AND PLAN / ED COURSE  ?I reviewed the triage vital signs and the nursing notes. ? ? ?Differential diagnosis includes, but is not limited to, head injury, contusion, cervical injury, cervical fracture, cervical contusion bilateral knees. ? ?46 year old female presents to the ED after being seen at her PCPs office and sent to the emergency department for evaluation of a fall in which there was a potential head injury that occurred over the weekend.  Patient denies any symptoms such as headache, visual changes, nausea or vomiting.  CT head scan was negative for intracranial changes acutely.  Also CT of the cervical spine was negative for fractures but did show degenerative changes and also some lucent lesions on the cervical spine that the radiologist suggest that a outpatient MRI be done.  Urinalysis was negative, troponin 3, lipase 29, CMP was unremarkable and CBC was reassuring with a hemoglobin of 12.3 and hematocrit 37.0.  Husband was present during this time and we spoke about the concerns that brought her to the emergency department.  Patient states that her occasional confusion started way before she fell.  He is not aware of any head injury and he was present although he was not in the shower with her.  He has not observed any changes out of the ordinary.  He states that she is seeing Dr. Malvin Johns for some neurology problems including her migraine headaches.  Her records indicate that there is some cognitive dysfunction.  Patient has been were reassured that there was no intracranial injury.  Patient is continue with her regular medication and follow-up with her PCP. ? ? ?FINAL CLINICAL IMPRESSION(S) / ED DIAGNOSES  ? ?Final diagnoses:  ?Contusion of knee, unspecified laterality, initial encounter  ?Fall in home, initial encounter  ? ? ? ?Rx / DC Orders  ? ?ED Discharge Orders   ? ? None  ? ?  ? ? ? ?Note:  This document was prepared using Dragon voice recognition  software and may include unintentional dictation errors. ?  ?Tommi Rumps, PA-C ?03/18/22 1604 ? ?  ?Willy Eddy, MD ?03/19/22 1309 ? ?

## 2022-03-18 NOTE — ED Triage Notes (Signed)
Pt comes into the ED via POV c/o mechanical fall on Saturday.  Pt states her right knee hurts, and she is unsure if she hit her head.  Pt denies any LOC.  Pt in NAD at this time with even and unlabored respirations and has steady gait.  ?

## 2022-03-18 NOTE — Assessment & Plan Note (Signed)
Given patient has had consistently elevated diastolic blood pressures will elect to treat her with amlodipine 5 mg daily.  Discussed this with patient.  She is to check her blood pressure daily at home.  She states that her husband [name] has a cuff and she should be able to use it.  She is to record blood pressures and send to me via MyChart after 1 week. ?

## 2022-03-18 NOTE — Progress Notes (Signed)
? ?Established Patient Office Visit ? ?Subjective:  ?Patient ID: Kathy Gamble, female    DOB: Nov 04, 1976  Age: 46 y.o. MRN: [id number] ? ?CC:  ?Chief Complaint  ?Patient presents with  ?? ER F//U  ?  No more nosebleeds. Still has a lingering headache the past 2 days. Has taking Tylenol last night and this morning and it helped to numb the pain. Patient states since the day of ER visit has had moments of feeling in a daze like. She wonders if she needs to start taking B/P medication since it is running high.  ? ? ?HPI ?Kathy Gamble presents for Nosebleed ? ?States that she was seen in the emergency department on 03/16/2022. States that she was told it was sinus related. States that she stopped the flonase. States that she has had no nose bleeds since then.  Patient blood pressure was elevated in the emergency department.  They did send her home with a prescription of Afrin to use as needed for epistaxis ? ?States that she fell on the way into the hospital.  States that her knees are bothering her.  When asked if she was evaluated today in the emergency department she states no. ?States that she has been using her husbands trellgy and is using her as needed albuterol inhaler 2 times a day.  When asked how often she did.  She states just yesterday. ? ?Is followed Dr Glenna Durand for chronic headaches.  She was placed on amitriptyline and titrated up patient had adverse drug event and was taken off the medication then placed on venlafaxine 37.5 mg daily.  Patient has not picked up or started medication yet.  Patient states she has follow-up later this month with Dr. Malvin Johns ? ?Patient also states towards the end of the visit that she has a rash on her left breast.  It to be looked at. ? ?Past Medical History:  ?Diagnosis Date  ?? Allergy   ?? Bee sting allergy 03/02/2015  ?? Chicken pox   ?? Frequent headaches   ?? GERD (gastroesophageal reflux disease)   ? ? ?Past Surgical History:  ?Procedure Laterality Date  ??  ADENOIDECTOMY    ?? TONSILLECTOMY    ? ? ?Family History  ?Problem Relation Age of Onset  ?? Stroke Mother   ?? Mental retardation Mother   ?? Cancer Maternal Grandmother   ?? Cancer Paternal Grandmother   ?     Breast  ?? Heart disease Paternal Grandfather   ? ? ?Social History  ? ?Socioeconomic History  ?? Marital status: Married  ?  Spouse name: Not on file  ?? Number of children: Not on file  ?? Years of education: Not on file  ?? Highest education level: Not on file  ?Occupational History  ?? Not on file  ?Tobacco Use  ?? Smoking status: Never  ?? Smokeless tobacco: Never  ?Vaping Use  ?? Vaping Use: Never used  ?Substance and Sexual Activity  ?? Alcohol use: No  ?  Alcohol/week: 0.0 standard drinks  ?? Drug use: Not Currently  ?  Types: Cocaine  ?? Sexual activity: Yes  ?  Birth control/protection: None  ?Other Topics Concern  ?? Not on file  ?Social History Narrative  ?? Not on file  ? ?Social Determinants of Health  ? ?Financial Resource Strain: Not on file  ?Food Insecurity: Not on file  ?Transportation Needs: Not on file  ?Physical Activity: Not on file  ?Stress: Not on file  ?Social Connections:  Not on file  ?Intimate Partner Violence: Not on file  ? ? ?Outpatient Medications Prior to Visit  ?Medication Sig Dispense Refill  ?? albuterol (VENTOLIN HFA) 108 (90 Base) MCG/ACT inhaler INHALE 1-2 PUFFS into THE lungs EVERY 6 HOURS AS NEEDED FOR SHORTNESS OF BREATH OR wheezing 18 g 0  ?? levocetirizine (XYZAL) 5 MG tablet TAKE ONE TABLET BY MOUTH ONCE DAILY AS NEEDED 90 tablet 0  ?? montelukast (SINGULAIR) 10 MG tablet TAKE ONE TABLET BY MOUTH ONCE DAILY FOR sinuses 90 tablet 3  ?? Multiple Vitamin (MULTIVITAMIN) capsule Take 1 capsule by mouth daily.    ?? omeprazole (PRILOSEC) 20 MG capsule TAKE ONE CAPSULE BY MOUTH ONCE DAILY 90 capsule 3  ?? oxymetazoline (AFRIN) 0.05 % nasal spray Place 2 sprays into both nostrils 2 (two) times daily. Do not use for more than 3 days in a row 15 mL 1  ?? nortriptyline  (PAMELOR) 10 MG capsule Start Nortriptyline (Pamelor) 10 mg nightly for one week, then increase to 20 mg nightly    ?? venlafaxine (EFFEXOR) 37.5 MG tablet Take by mouth. (Patient not taking: Reported on 03/18/2022)    ?? amoxicillin-clavulanate (AUGMENTIN) 875-125 MG tablet Take 1 tablet by mouth 2 (two) times daily. 14 tablet 0  ?? fluticasone (FLONASE) 50 MCG/ACT nasal spray INSTILL 1 SPRAY IN EACH NOSTRIL ONCE DAILY 48 g 3  ? ?No facility-administered medications prior to visit.  ? ? ?Allergies  ?Allergen Reactions  ?? Bee Venom Itching  ?? Latex Rash  ?  The powder in the gloves causes rash  ? ? ?ROS ?Review of Systems  ?Constitutional:  Negative for chills and fever.  ?HENT:  Positive for sneezing (once). Negative for nosebleeds.   ?Musculoskeletal:  Positive for arthralgias and joint swelling.  ?Skin:  Positive for color change.  ?Neurological:  Positive for headaches. Negative for dizziness.  ? ?  ?Objective:  ?  ?Physical Exam ?Vitals and nursing note reviewed. Exam conducted with a chaperone present [employer], CMA).  ?Constitutional:   ?   Appearance: Normal appearance. She is obese.  ?HENT:  ?   Right Ear: Ear canal and external ear normal. There is no impacted cerumen.  ?   Left Ear: Ear canal and external ear normal. There is no impacted cerumen.  ?   Mouth/Throat:  ?   Mouth: Mucous membranes are moist.  ?   Pharynx: Oropharynx is clear.  ?Cardiovascular:  ?   Rate and Rhythm: Normal rate and regular rhythm.  ?   Heart sounds: Normal heart sounds.  ?Pulmonary:  ?   Effort: Pulmonary effort is normal.  ?   Breath sounds: Normal breath sounds.  ?Chest:  ? ? ?Musculoskeletal:     ?   General: Tenderness and signs of injury present.  ?   Right knee: Bony tenderness present. No swelling or ecchymosis.  ?   Left knee: Ecchymosis present.  ?   Right lower leg: No edema.  ?   Left lower leg: No edema.  ?     Legs: ? ?Lymphadenopathy:  ?   Cervical: No cervical adenopathy.  ?Skin: ?   Findings: Bruising  present.  ?Neurological:  ?   Mental Status: She is alert.  ? ? ?BP (!) 142/100   Pulse 87   Temp (!) 96.9 ?F (36.1 ?C)   Resp 14   Ht 5' 7.5" (1.715 m)   Wt 254 lb 6 oz (115.4 kg)   SpO2 99%   BMI 39.25  kg/m?  ?Wt Readings from Last 3 Encounters:  ?03/18/22 254 lb 6 oz (115.4 kg)  ?03/18/22 254 lb 6 oz (115.4 kg)  ?02/20/22 252 lb 9.6 oz (114.6 kg)  ? ? ? ?  03/18/2022  ? 12:30 PM 03/18/2022  ? 11:22 AM 03/18/2022  ? 11:19 AM  ?Vitals with BMI  ?Height   5' 7.5"  ?Weight   254 lbs 6 oz  ?BMI   39.23  ?Systolic 134 145   ?Diastolic 81 80   ?Pulse 76 70   ?  ?Health Maintenance Due  ?Topic Date Due  ?? HIV Screening  Never done  ?? Hepatitis C Screening  Never done  ?? COVID-19 Vaccine (4 - Booster for Pfizer series) 02/05/2021  ?? COLONOSCOPY (Pts 45-14yrs Insurance coverage will need to be confirmed)  Never done  ? ? ?There are no preventive care reminders to display for this patient. ? ?Lab Results  ?Component Value Date  ? TSH 1.83 12/03/2021  ? ?Lab Results  ?Component Value Date  ? WBC 6.2 03/18/2022  ? HGB 12.3 03/18/2022  ? HCT 37.0 03/18/2022  ? MCV 87.5 03/18/2022  ? PLT 242 03/18/2022  ? ?Lab Results  ?Component Value Date  ? NA 141 03/18/2022  ? K 3.8 03/18/2022  ? CO2 27 03/18/2022  ? GLUCOSE 96 03/18/2022  ? BUN 14 03/18/2022  ? CREATININE 0.62 03/18/2022  ? BILITOT 0.4 03/18/2022  ? ALKPHOS 103 03/18/2022  ? AST 14 (L) 03/18/2022  ? ALT 13 03/18/2022  ? PROT 6.3 (L) 03/18/2022  ? ALBUMIN 3.3 (L) 03/18/2022  ? CALCIUM 8.4 (L) 03/18/2022  ? ANIONGAP 5 03/18/2022  ? GFR 105.08 12/03/2021  ? ?Lab Results  ?Component Value Date  ? CHOL 141 12/03/2021  ? ?Lab Results  ?Component Value Date  ? HDL 49.80 12/03/2021  ? ?Lab Results  ?Component Value Date  ? LDLCALC 76 12/03/2021  ? ?Lab Results  ?Component Value Date  ? TRIG 76.0 12/03/2021  ? ?Lab Results  ?Component Value Date  ? CHOLHDL 3 12/03/2021  ? ?Lab Results  ?Component Value Date  ? HGBA1C 4.9 12/03/2021  ? ? ?  ?Assessment & Plan:  ? ?Problem List  Items Addressed This Visit   ? ?  ? Cardiovascular and Mediastinum  ? Primary hypertension  ?  Given patient has had consistently elevated diastolic blood pressures will elect to treat her with amlodipine 5 mg daily.  Discussed

## 2022-03-18 NOTE — Telephone Encounter (Signed)
Saw that patient is in the ED currently ?

## 2022-03-18 NOTE — Assessment & Plan Note (Signed)
Small comedone present under left medial breast area.  Did discuss this may be more frequent with the hot weather and skin touching skin with sweating.  Patient acknowledged ?

## 2022-03-18 NOTE — Assessment & Plan Note (Signed)
Self resolved prior to going to emergency department.  Patient has stopped Flonase.  Continue to monitor will elect to treat patient's blood pressure this could have been a contributing factor ?

## 2022-03-18 NOTE — Telephone Encounter (Signed)
Pt was a pleasant, calm lady that has already seen [name] NP this morning. While pt was in xray she evidently said that she hit her head when she fell and pt thought she might have UTI. I spoke with pt. I asked pt when she fell and pt said last night, no it was night before last  and then she said I am not sure when I fell. I asked pt where she was hurting. Pt said her left knee was hurting; pt said when she fell she was not sure if she hit a table or not. Pt said she went down on both knees and hurt her upper back and neck. Pt wanted to know if she should take mood stabilizer for her back. I asked pt what [name] NP advised her to do.  Pt said he told her he was getting an xray of rt knee and pt said she wondered why because lt knee was the knee that was hurting. When I questioned pt about lt vs rt knee pain; pt said "I don't know which knee is hurting." I asked pt if she was hurting anywhere else and she said yes her head had been hurting since she fell night before last when pt got out of shower and fell. Pt thinks she hit her head and is not sure if she lost consciousness or not. Pt said felt like she was in a dream after the fall in the bathroom. Pt said she told her husband she fell and pt went to bed and went to sleep. Pt said when she woke up she had a terrible H/A that has continued. So pt verified she has fallen x 2 in last 2 days. Pt said "she felt like she was in a daze on and off since the ED visit." Pts BP was 142/100 P 87 at visit today and pt said she was supposed to start taking BP med and take BP daily and write it down. Pt slid the chair she was sitting in over slightly and pt immediately said she did not move the chair. I said that is OK and because of the 2 recent falls and you are not sure if you hit your head and lost consciousness or not and you feel like in a daze or dream on and off since the fall Pt needs to go to ED for eval and possible testing. Pt voiced understanding and pt said  her husband was in car outside waiting on pt. Pt said she had had a UTI but she took med for that; I asked pt if she had any symptoms or problems with urinary symptoms and pt said no. Pt said you asked me if I felt dizzy and I said no but the room does spin around once in a while. I assisted pt into w/c and took pt to front door. I asked pt if she could call her husband to come to front door of office. Pt tried for few mins but could not figure out how to call pt's husband on phone. I asked pt if she knew where they parked and she pointed to the right side of parking lot. I asked pt what type car her husband was driving and pt said grey versa. There was no versa on rt side of parking lot and no people in the cars on the rt side of parking lot.I started to left side of parking lot and pt said he did not park over here; I said  in the 2nd handicap space there is a grey versa and pt said no that is not the car; I stopped and looked at the rest of parking lot and did not see a small car or versa. Pt then said this is the car here which was the same grey versa. I assisted pt into car and advised husband what pt had said and asked if I needed to call EMS to take pt and he said no he would take pt to [facility] ED now. Sending note to [name] NP and Amy front office mgr and Financial controller. Per chart review tab pt is at [facility] ED. ?

## 2022-03-18 NOTE — Telephone Encounter (Signed)
See note below the access nurse note; pt ha appt to see [name] NP 03/18/22 at 9 AM. I tried calling pt but unable to reach pt by phone. Sending note to [name] NP and Anastasiya CMA. ? ? ? ?[place] Night - Client ?Nonclinical Telephone Record  ?AccessNurse? ?Client [place] Night - Client ?Client Site [place] ?Provider AA - PHYSICIAN, NOT LISTED- MD ?Contact Type Call ?Who Is Calling Patient / Member / Family / Caregiver ?[name] Name Kathy Gamble ?[name] Phone Number [phone] ?Call Type Message Only Information Provided ?Reason for Call Returning a Call from the Office ?Initial Comment [name] states she is returning a call. [name] was in the hospital with a headache and a nosebleed. ?She was not given any pain medicine. She was only given a nose spray. ?Disp. Time Disposition Final User ?03/17/2022 5:17:43 PM General Information Provided Yes Junius Creamer ?Call Closed By: Junius Creamer ?Transaction Date/Time: 03/17/2022 5:13:35 PM (ET ?

## 2022-03-25 ENCOUNTER — Telehealth: Payer: Self-pay

## 2022-03-25 NOTE — Telephone Encounter (Signed)
Kathy Gamble front office mgr received e mail from Kathy Gamble requesting FU appt on pt.Unable to reach pt or pts husband (DPR signed) left v/m at pts phone and pts husband phone requesting cb to [facility] to get update on how pt is doing and schedule a 40 min FU appt with Kathy Garret NP. Sending note to Kathy Gamble triage and Kathy Gamble CMA. ?

## 2022-03-25 NOTE — Telephone Encounter (Signed)
Still not able to reach pt or her husband by phone. Will try again tomorrow. ?

## 2022-03-26 NOTE — Telephone Encounter (Signed)
Left v/m on pts phone to call [facility]. ?

## 2022-03-26 NOTE — Telephone Encounter (Signed)
Noted. Agree with plan.

## 2022-03-26 NOTE — Telephone Encounter (Signed)
I spoke with Kathy Gamble(DPR signed) and he said that pt has not been cking BP daily because having problems with BP cuff but Kathy Gamble said that is resolved now. On 03/25/22 pts BP was 120/100 and no CP,SOB,dizziness or vision changes. Kathy Gamble said pt usually has H/A's and is seeing neurologist for that. Pt has not taken BP today due to pt still sleeping. Kathy Gamble said after pt wakes up they will ck pts BP. I asked if could ck BP daily and record on a piece of paper and bring to FU appt with Audria Nine NP on 03/31/22 at 11:20 appt. Kathy Gamble voiced understanding and will do BP log.UC and ED precautions given and Kathy Gamble voiced understanding. Sending note to Audria Nine NP who is out of office, Anastasiya CMA and Allayne Gitelman NP who is in office. ?

## 2022-03-27 NOTE — Telephone Encounter (Signed)
Agreed. I see she has an appointment scheduled on 03/31/2022 ?

## 2022-03-31 ENCOUNTER — Ambulatory Visit (INDEPENDENT_AMBULATORY_CARE_PROVIDER_SITE_OTHER): Payer: Medicare HMO | Admitting: Nurse Practitioner

## 2022-03-31 ENCOUNTER — Telehealth: Payer: Self-pay | Admitting: Nurse Practitioner

## 2022-03-31 VITALS — BP 140/110 | HR 96 | Temp 97.6°F | Resp 12 | Ht 67.5 in | Wt 248.0 lb

## 2022-03-31 DIAGNOSIS — R937 Abnormal findings on diagnostic imaging of other parts of musculoskeletal system: Secondary | ICD-10-CM | POA: Diagnosis not present

## 2022-03-31 DIAGNOSIS — I10 Essential (primary) hypertension: Secondary | ICD-10-CM | POA: Diagnosis not present

## 2022-03-31 NOTE — Progress Notes (Signed)
? ?Acute Office Visit ? ?Subjective:  ? ?  ?Patient ID: Kathy Gamble, female    DOB: 12/12/1976, 46 y.o.   MRN: [id number] ? ?Chief Complaint  ?Patient presents with  ? Hypertension  ?  Follow up, reading at home 03/30/22 120/97. B/p readings up and down at home. Highest reading was around 180/97.  ? ? ?Hypertension ?Associated symptoms include headaches. Pertinent negatives include no chest pain.  ?Patient is in today for ED follow up and Hypertension recheck ? ?States that she has been taking the medication twice a day and taking blood pressure once daily ? ?She went to ED on 03/18/2022 and had a CT of her head and neck. CT head came back normal without acute abnormality.  CT cervical spine did not show any acute fractures but showed lesions concerning for MS or myeloma.  Patient is currently followed by Dr. Theora Master neurologist with [facility]. ? ? ?Review of Systems  ?Constitutional:  Negative for chills and fever.  ?Eyes: Negative.   ?Respiratory:  Negative for cough.   ?Cardiovascular:  Negative for chest pain and leg swelling.  ?Neurological:  Positive for headaches.  ? ? ?   ?Objective:  ?  ?BP (!) 140/110   Pulse 96   Temp 97.6 ?F (36.4 ?C)   Resp 12   Ht 5' 7.5" (1.715 m)   Wt 248 lb (112.5 kg)   LMP  (LMP Unknown)   SpO2 98%   BMI 38.27 kg/m?  ?BP Readings from Last 3 Encounters:  ?03/31/22 (!) 140/110  ?03/18/22 132/87  ?03/18/22 (!) 142/100  ? ?  ? ?Physical Exam ?Vitals and nursing note reviewed.  ?Constitutional:   ?   Appearance: Normal appearance. She is obese.  ?Eyes:  ?   Extraocular Movements: Extraocular movements intact.  ?   Pupils: Pupils are equal, round, and reactive to light.  ?Cardiovascular:  ?   Rate and Rhythm: Normal rate and regular rhythm.  ?   Heart sounds: Normal heart sounds.  ?Pulmonary:  ?   Effort: Pulmonary effort is normal.  ?   Breath sounds: Normal breath sounds.  ?Musculoskeletal:  ?   Right lower leg: No edema.  ?   Left lower leg: No edema.   ?Neurological:  ?   General: No focal deficit present.  ?   Mental Status: She is alert.  ?   Comments: Bilateral upper and lower extremity strength 5/5  ? ? ?No results found for any visits on 03/31/22. ? ? ?   ?Assessment & Plan:  ? ?Problem List Items Addressed This Visit   ? ?  ? Cardiovascular and Mediastinum  ? Primary hypertension  ?  Patient is here for follow-up with hypertension.  Patient was prescribed amlodipine 5 mg.  Per patient report she has been taking amlodipine 5 mg once in the morning once in the evening.  Patient has been checking her blood pressure daily she did bring one blood pressure reading of 120/97.  She denies any adverse drug effects.  Blood pressure above goal in office.  Did discuss for patient to call office back in the give [place] blood pressure readings that she has recorded at home and to verify that she is taking 2 of the amlodipine blood pressure pills daily.  If blood pressure still above goal and patient is taking a total of 10 mg of amlodipine consider the addition of thiazide diuretic or ACE/ARB.  My concern with doing an ACE and ARB's if she  doubles or changes the medication regimen on her own and this could attribute to the hyperkalemia or serum creatinine increase and GFR decreased.  We will pend patient's blood pressure readings from home ? ?  ?  ?  ? Other  ? Abnormal CT of spine - Primary  ?  Patient was evaluated emergency department where a CT of head and cervical spine were performed.  CT of head was negative CT cervical spine negative for any acute fracture or aggressive lesions along the spine that could be consistent with MS or myeloma.  Recommend nonemergent MRI with and without contrast.  Patient is followed by Dr. Theora Master who is a neurologist.  We will call to make sure his office is aware and see if he will take over the MRI.  Neurological exam intact in office today ? ?  ?  ? ? ?No orders of the defined types were placed in this encounter. ? ? ?Return in  about 4 weeks (around 04/28/2022) for bp recheck. ? ?Audria Nine, NP ? ? ?

## 2022-03-31 NOTE — Assessment & Plan Note (Signed)
Patient was evaluated emergency department where a CT of head and cervical spine were performed.  CT of head was negative CT cervical spine negative for any acute fracture or aggressive lesions along the spine that could be consistent with MS or myeloma.  Recommend nonemergent MRI with and without contrast.  Patient is followed by Dr. Theora Master who is a neurologist.  We will call to make sure his office is aware and see if he will take over the MRI.  Neurological exam intact in office today ?

## 2022-03-31 NOTE — Telephone Encounter (Signed)
Pt husband called to give pt Blood Pressure readings: 03/26/22 at 12:00 BP 143/99 Pulse 70 03/27/22 at 12:00 BP 151/122 Pulse 93 03/28/22 at 2:00 BP 125/101 Pulse 87 03/29/22 at 10:00 130/97 Pulse 78 03/30/22 at 10:00 126/97 Pulse 91 03/31/22 at 2:11 130/97 Pulse 84. Please advise. ?

## 2022-03-31 NOTE — Patient Instructions (Signed)
When you get home look at your bottles and pills. The blood pressure medication is amlodipine (Norvasc) 5mg . I had asked that you take that once a day. You mentioned you are taking it twice a day. I want you to call the office back and let me know if you are taking 2 tablets of the amlodipine everyday.  ? ?I want you to check your blood pressure once a day and write down the numbers for me everyday for the next 14 days. ?I want to see you back in 1 month to make sure the blood pressure is doing ok. ? ?Keep the appointment with Dr. this week ? ?You can show this to [facility] also, if you want ?

## 2022-03-31 NOTE — Assessment & Plan Note (Signed)
Patient is here for follow-up with hypertension.  Patient was prescribed amlodipine 5 mg.  Per patient report she has been taking amlodipine 5 mg once in the morning once in the evening.  Patient has been checking her blood pressure daily she did bring one blood pressure reading of 120/97.  She denies any adverse drug effects.  Blood pressure above goal in office.  Did discuss for patient to call office back in the give [place] blood pressure readings that she has recorded at home and to verify that she is taking 2 of the amlodipine blood pressure pills daily.  If blood pressure still above goal and patient is taking a total of 10 mg of amlodipine consider the addition of thiazide diuretic or ACE/ARB.  My concern with doing an ACE and ARB's if she doubles or changes the medication regimen on her own and this could attribute to the hyperkalemia or serum creatinine increase and GFR decreased.  We will pend patient's blood pressure readings from home ?

## 2022-04-01 ENCOUNTER — Other Ambulatory Visit: Payer: Self-pay | Admitting: Nurse Practitioner

## 2022-04-01 MED ORDER — HYDROCHLOROTHIAZIDE 12.5 MG PO TABS
12.5000 mg | ORAL_TABLET | Freq: Every day | ORAL | 0 refills | Status: DC
Start: 2022-04-01 — End: 2022-04-22

## 2022-04-01 MED ORDER — AMLODIPINE BESYLATE 10 MG PO TABS: 10.0000 mg | ORAL_TABLET | Freq: Every day | ORAL | 0 refills | Status: DC

## 2022-04-01 NOTE — Telephone Encounter (Signed)
Her blood pressure is above goal. She can continue taking amlodipine 5mg  2 tablets at the same time every day. She can finish the bottle she has and then pick up the updated script. will send in an updated script that will be amlodipine 10mg . When she picks this up she needs to take ONLY 1 tablet a day as this is top dose. ?Can we call [facility] and cancel the 5mg  script please ? ?I will add on a medication called hydrochlorothiazide. She will take only ONE tablet a day in the morning as it will cause her to pee.  ? ?Continue to check blood pressure over the next week daily and call the numbers back like they did here. Keep the month follow up with me as scheduled ?

## 2022-04-01 NOTE — Telephone Encounter (Signed)
Spoke with patient. After a lengthy discussion patient stated that she was taking Amlodipine 5 mg 1 tablet 2 times daily art first and then noticed the bottle said once daily so she started taking 1 tablet once daily about 2 weeks ago. Spoke with [facility] about patient taking 5 mg once daily. Advised per [facility] to take 5 mg 2 tablets once daily and follow up in 1 month. To call back in 1 week with readings of b/p like they did yesterday. Not adding HCTZ at this time.  ?Patient said she may be going back to work and was not sure what her hours would be. I advised patient to call us back to reschedule her follow up appointment if needed. ?Fyi to PCP ?

## 2022-04-02 DIAGNOSIS — R937 Abnormal findings on diagnostic imaging of other parts of musculoskeletal system: Secondary | ICD-10-CM | POA: Diagnosis not present

## 2022-04-02 DIAGNOSIS — R519 Headache, unspecified: Secondary | ICD-10-CM | POA: Diagnosis not present

## 2022-04-02 DIAGNOSIS — F411 Generalized anxiety disorder: Secondary | ICD-10-CM | POA: Diagnosis not present

## 2022-04-02 DIAGNOSIS — G479 Sleep disorder, unspecified: Secondary | ICD-10-CM | POA: Diagnosis not present

## 2022-04-07 ENCOUNTER — Other Ambulatory Visit: Payer: Self-pay | Admitting: Neurology

## 2022-04-07 DIAGNOSIS — G35 Multiple sclerosis: Secondary | ICD-10-CM

## 2022-04-14 ENCOUNTER — Other Ambulatory Visit: Payer: Self-pay | Admitting: Nurse Practitioner

## 2022-04-17 ENCOUNTER — Other Ambulatory Visit: Payer: Self-pay | Admitting: Nurse Practitioner

## 2022-04-17 ENCOUNTER — Ambulatory Visit (INDEPENDENT_AMBULATORY_CARE_PROVIDER_SITE_OTHER): Payer: Medicare PPO

## 2022-04-17 VITALS — Wt 248.0 lb

## 2022-04-17 DIAGNOSIS — Z0000 Encounter for general adult medical examination without abnormal findings: Secondary | ICD-10-CM | POA: Diagnosis not present

## 2022-04-17 DIAGNOSIS — G43109 Migraine with aura, not intractable, without status migrainosus: Secondary | ICD-10-CM

## 2022-04-17 NOTE — Patient Instructions (Signed)
Kathy Gamble , ?Thank you for taking time to come for your Medicare Wellness Visit. I appreciate your ongoing commitment to your health goals. Please review the following plan we discussed and let me know if I can assist you in the future.  ? ?Screening recommendations/referrals: ?Colonoscopy: Due at age 46 ?Mammogram: Ordered 11/2021 - schedule soon ?Bone Density: due at age 18 ?Recommended yearly ophthalmology/optometry visit for glaucoma screening and checkup ?Recommended yearly dental visit for hygiene and checkup ? ?Vaccinations: ?Influenza vaccine: Done 12/19/2021 - Repeat annually ?Pneumococcal vaccine: Due after age 46 ?Tdap vaccine: Done 01/02/2015 - Repeat in 10 years ?Shingles vaccine: Due after age 44  ?Covid-19: Done 03/21/2020, 04/12/2020, & 12/11/2020 ? ?Advanced directives: Please bring a copy of your health care power of attorney and living will to the office to be added to your chart at your convenience.  ? ?Conditions/risks identified: Aim for 30 minutes of exercise or brisk walking, 6-8 glasses of water, and 5 servings of fruits and vegetables each day.  ? ?Next appointment: Follow up in one year for your annual wellness visit.  ? ?Preventive Care 40-64 Years, Female ?Preventive care refers to lifestyle choices and visits with your health care provider that can promote health and wellness. ?What does preventive care include? ?A yearly physical exam. This is also called an annual well check. ?Dental exams once or twice a year. ?Routine eye exams. Ask your health care provider how often you should have your eyes checked. ?Personal lifestyle choices, including: ?Daily care of your teeth and gums. ?Regular physical activity. ?Eating a healthy diet. ?Avoiding tobacco and drug use. ?Limiting alcohol use. ?Practicing safe sex. ?Taking low-dose aspirin daily starting at age 19. ?Taking vitamin and mineral supplements as recommended by your health care provider. ?What happens during an annual well check? ?The  services and screenings done by your health care provider during your annual well check will depend on your age, overall health, lifestyle risk factors, and family history of disease. ?Counseling  ?Your health care provider may ask you questions about your: ?Alcohol use. ?Tobacco use. ?Drug use. ?Emotional well-being. ?Home and relationship well-being. ?Sexual activity. ?Eating habits. ?Work and work Statistician. ?Method of birth control. ?Menstrual cycle. ?Pregnancy history. ?Screening  ?You may have the following tests or measurements: ?Height, weight, and BMI. ?Blood pressure. ?Lipid and cholesterol levels. These may be checked every 5 years, or more frequently if you are over 40 years old. ?Skin check. ?Lung cancer screening. You may have this screening every year starting at age 73 if you have a 30-pack-year history of smoking and currently smoke or have quit within the past 15 years. ?Fecal occult blood test (FOBT) of the stool. You may have this test every year starting at age 53. ?Flexible sigmoidoscopy or colonoscopy. You may have a sigmoidoscopy every 5 years or a colonoscopy every 10 years starting at age 53. ?Hepatitis C blood test. ?Hepatitis B blood test. ?Sexually transmitted disease (STD) testing. ?Diabetes screening. This is done by checking your blood sugar (glucose) after you have not eaten for a while (fasting). You may have this done every 1-3 years. ?Mammogram. This may be done every 1-2 years. Talk to your health care provider about when you should start having regular mammograms. This may depend on whether you have a family history of breast cancer. ?BRCA-related cancer screening. This may be done if you have a family history of breast, ovarian, tubal, or peritoneal cancers. ?Pelvic exam and Pap test. This may be done every 3  years starting at age 66. Starting at age 3, this may be done every 5 years if you have a Pap test in combination with an HPV test. ?Bone density scan. This is done to  screen for osteoporosis. You may have this scan if you are at high risk for osteoporosis. ?Discuss your test results, treatment options, and if necessary, the need for more tests with your health care provider. ?Vaccines  ?Your health care provider may recommend certain vaccines, such as: ?Influenza vaccine. This is recommended every year. ?Tetanus, diphtheria, and acellular pertussis (Tdap, Td) vaccine. You may need a Td booster every 10 years. ?Zoster vaccine. You may need this after age 58. ?Pneumococcal 13-valent conjugate (PCV13) vaccine. You may need this if you have certain conditions and were not previously vaccinated. ?Pneumococcal polysaccharide (PPSV23) vaccine. You may need one or two doses if you smoke cigarettes or if you have certain conditions. ?Talk to your health care provider about which screenings and vaccines you need and how often you need them. ?This information is not intended to replace advice given to you by your health care provider. Make sure you discuss any questions you have with your health care provider. ?Document Released: 12/27/2015 Document Revised: 08/19/2016 Document Reviewed: 10/01/2015 ?Elsevier Interactive Patient Education ? 2017 [employer] ? ? ? ?Fall Prevention in the Home ?Falls can cause injuries. They can happen to people of all ages. There are many things you can do to make your home safe and to help prevent falls. ?What can I do on the outside of my home? ?Regularly fix the edges of walkways and driveways and fix any cracks. ?Remove anything that might make you trip as you walk through a door, such as a raised step or threshold. ?Trim any bushes or trees on the path to your home. ?Use bright outdoor lighting. ?Clear any walking paths of anything that might make someone trip, such as rocks or tools. ?Regularly check to see if handrails are loose or broken. Make sure that both sides of any steps have handrails. ?Any raised decks and porches should have guardrails on  the edges. ?Have any leaves, snow, or ice cleared regularly. ?Use sand or salt on walking paths during winter. ?Clean up any spills in your garage right away. This includes oil or grease spills. ?What can I do in the bathroom? ?Use night lights. ?Install grab bars by the toilet and in the tub and shower. Do not use towel bars as grab bars. ?Use non-skid mats or decals in the tub or shower. ?If you need to sit down in the shower, use a plastic, non-slip stool. ?Keep the floor dry. Clean up any water that spills on the floor as soon as it happens. ?Remove soap buildup in the tub or shower regularly. ?Attach bath mats securely with double-sided non-slip rug tape. ?Do not have throw rugs and other things on the floor that can make you trip. ?What can I do in the bedroom? ?Use night lights. ?Make sure that you have a light by your bed that is easy to reach. ?Do not use any sheets or blankets that are too big for your bed. They should not hang down onto the floor. ?Have a firm chair that has side arms. You can use this for support while you get dressed. ?Do not have throw rugs and other things on the floor that can make you trip. ?What can I do in the kitchen? ?Clean up any spills right away. ?Avoid walking on wet floors. ?  Keep items that you use a lot in easy-to-reach places. ?If you need to reach something above you, use a strong step stool that has a grab bar. ?Keep electrical cords out of the way. ?Do not use floor polish or wax that makes floors slippery. If you must use wax, use non-skid floor wax. ?Do not have throw rugs and other things on the floor that can make you trip. ?What can I do with my stairs? ?Do not leave any items on the stairs. ?Make sure that there are handrails on both sides of the stairs and use them. Fix handrails that are broken or loose. Make sure that handrails are as long as the stairways. ?Check any carpeting to make sure that it is firmly attached to the stairs. Fix any carpet that is loose  or worn. ?Avoid having throw rugs at the top or bottom of the stairs. If you do have throw rugs, attach them to the floor with carpet tape. ?Make sure that you have a light switch at the top of the stairs

## 2022-04-17 NOTE — Progress Notes (Signed)
? ?Subjective:  ? Kathy Gamble is a 46 y.o. female who presents for Medicare Annual (Subsequent) preventive examination. ? ?Virtual Visit via Telephone Note ? ?I connected with  Kathy Gamble on 04/17/22 at  9:00 AM EDT by telephone and verified that I am speaking with the correct person using two identifiers. ? ?Location: ?Patient: Home ?Provider: [facility] ?Persons participating in the virtual visit: patient/Nurse Health Advisor ?  ?I discussed the limitations, risks, security and privacy concerns of performing an evaluation and management service by telephone and the availability of in person appointments. The patient expressed understanding and agreed to proceed. ? ?Interactive audio and video telecommunications were attempted between this nurse and patient, however failed, due to patient having technical difficulties OR patient did not have access to video capability.  We continued and completed visit with audio only. ? ?Some vital signs may be absent or patient reported.  ? ?Kathy Tunnell Hurman Horn, LPN  ? ?Review of Systems    ? ?Cardiac Risk Factors include: hypertension;sedentary lifestyle;obesity (BMI >30kg/m2) ? ?   ?Objective:  ?  ?Today's Vitals  ? 04/17/22 0906  ?Weight: 248 lb (112.5 kg)  ?PainSc: 5   ? ?Body mass index is 38.27 kg/m?. ? ? ?  04/17/2022  ?  9:29 AM 03/18/2022  ? 11:20 AM 03/16/2022  ?  6:38 PM 04/13/2016  ?  9:59 AM 01/17/2016  ?  5:50 PM  ?Advanced Directives  ?Does Patient Have a Medical Advance Directive? No No No Yes No  ?Would patient like information on creating a medical advance directive? No - Patient declined      ? ? ?Current Medications (verified) ?Outpatient Encounter Medications as of 04/17/2022  ?Medication Sig  ? albuterol (VENTOLIN HFA) 108 (90 Base) MCG/ACT inhaler INHALE 1-2 PUFFS into THE lungs EVERY 6 HOURS AS NEEDED FOR SHORTNESS OF BREATH OR wheezing  ? amLODipine (NORVASC) 10 MG tablet Take 1 tablet (10 mg total) by mouth daily.  ? gabapentin (NEURONTIN) 300 MG capsule  Take by mouth.  ? hydrochlorothiazide (HYDRODIURIL) 12.5 MG tablet Take 1 tablet (12.5 mg total) by mouth daily.  ? levocetirizine (XYZAL) 5 MG tablet TAKE ONE TABLET BY MOUTH ONCE DAILY AS NEEDED  ? montelukast (SINGULAIR) 10 MG tablet TAKE ONE TABLET BY MOUTH ONCE DAILY FOR sinuses  ? Multiple Vitamin (MULTIVITAMIN) capsule Take 1 capsule by mouth daily.  ? omeprazole (PRILOSEC) 20 MG capsule TAKE ONE CAPSULE BY MOUTH ONCE DAILY  ? oxymetazoline (AFRIN) 0.05 % nasal spray Place 2 sprays into both nostrils 2 (two) times daily. Do not use for more than 3 days in a row  ? venlafaxine (EFFEXOR) 75 MG tablet Take by mouth.  ? [DISCONTINUED] venlafaxine (EFFEXOR) 37.5 MG tablet Take by mouth. (Patient not taking: Reported on 04/17/2022)  ? ?No facility-administered encounter medications on file as of 04/17/2022.  ? ? ?Allergies (verified) ?Bee venom and Latex  ? ?History: ?Past Medical History:  ?Diagnosis Date  ? Allergy   ? Bee sting allergy 03/02/2015  ? Chicken pox   ? Frequent headaches   ? GERD (gastroesophageal reflux disease)   ? ?Past Surgical History:  ?Procedure Laterality Date  ? ADENOIDECTOMY    ? TONSILLECTOMY    ? ?Family History  ?Problem Relation Age of Onset  ? Stroke Mother   ? Mental retardation Mother   ? Cancer Maternal Grandmother   ? Cancer Paternal Grandmother   ?     Breast  ? Heart disease Paternal Grandfather   ? ?  Social History  ? ?Socioeconomic History  ? Marital status: Married  ?  Spouse name: Not on file  ? Number of children: Not on file  ? Years of education: Not on file  ? Highest education level: Not on file  ?Occupational History  ? Occupation: disabled  ?Tobacco Use  ? Smoking status: Never  ? Smokeless tobacco: Never  ?Vaping Use  ? Vaping Use: Never used  ?Substance and Sexual Activity  ? Alcohol use: No  ?  Alcohol/week: 0.0 standard drinks  ? Drug use: Not Currently  ?  Types: Cocaine  ? Sexual activity: Yes  ?  Birth control/protection: None  ?Other Topics Concern  ? Not on file   ?Social History Narrative  ? Cognitive dysfunction - lives with her husband  ? ?Social Determinants of Health  ? ?Financial Resource Strain: Low Risk   ? Difficulty of Paying Living Expenses: Not hard at all  ?Food Insecurity: No Food Insecurity  ? Worried About Programme researcher, broadcasting/film/video in the Last Year: Never true  ? Ran Out of Food in the Last Year: Never true  ?Transportation Needs: No Transportation Needs  ? Lack of Transportation (Medical): No  ? Lack of Transportation (Non-Medical): No  ?Physical Activity: Insufficiently Active  ? Days of Exercise per Week: 7 days  ? Minutes of Exercise per Session: 20 min  ?Stress: Stress Concern Present  ? Feeling of Stress : To some extent  ?Social Connections: Socially Integrated  ? Frequency of Communication with Friends and Family: More than three times a week  ? Frequency of Social Gatherings with Friends and Family: More than three times a week  ? Attends Religious Services: 1 to 4 times per year  ? Active Member of Clubs or Organizations: Yes  ? Attends Banker Meetings: 1 to 4 times per year  ? Marital Status: Married  ? ? ?Tobacco Counseling ?Counseling given: Not Answered ? ? ?Clinical Intake: ? ?Pre-visit preparation completed: Yes ? ?Pain : 0-10 ?Pain Score: 5  ?Pain Type: Chronic pain ?Pain Location: Head ?Pain Orientation: Anterior ?Pain Radiating Towards: chronic intermittent migraines ?Pain Descriptors / Indicators: Aching ?Pain Onset: More than a month ago ?Pain Frequency: Intermittent ? ?  ? ?BMI - recorded: 38.27 ?Nutritional Status: BMI > 30  Obese ?Nutritional Risks: None ?Diabetes: No ? ?How often do you need to have someone help you when you read instructions, pamphlets, or other written materials from your doctor or pharmacy?: 1 - Never ? ?Diabetic? no ? ?Interpreter Needed?: No ? ?Information entered by :: Marx Doig, LPN ? ? ?Activities of Daily Living ? ?  04/17/2022  ?  9:24 AM  ?In your present state of health, do you have any difficulty  performing the following activities:  ?Hearing? 0  ?Vision? 0  ?Difficulty concentrating or making decisions? 1  ?Walking or climbing stairs? 0  ?Dressing or bathing? 0  ?Doing errands, shopping? 1  ?Preparing Food and eating ? N  ?Using the Toilet? N  ?In the past six months, have you accidently leaked urine? N  ?Do you have problems with loss of bowel control? N  ?Managing your Medications? N  ?Managing your Finances? Y  ?Housekeeping or managing your Housekeeping? N  ? ? ?Patient Care Team: ?Eden Emms, NP as PCP - General ?Genelle Gather, OD (Optometry) ? ?Indicate any recent Medical Services you may have received from other than [facility] providers in the past year (date may be approximate). ? ?   ?  Assessment:  ? This is a routine wellness examination for Kathy Gamble. ? ?Hearing/Vision screen ?Hearing Screening - Comments:: Denies hearing difficulties   ?Vision Screening - Comments:: Wears rx glasses - up to date with routine eye exams with Carollee Massed, optometrist ? ?Dietary issues and exercise activities discussed: ?Current Exercise Habits: Home exercise routine, Type of exercise: walking, Time (Minutes): 20, Frequency (Times/Week): 7, Weekly Exercise (Minutes/Week): 140, Intensity: Mild, Exercise limited by: orthopedic condition(s);psychological condition(s) ? ? Goals Addressed   ? ?  ?  ?  ?  ? This Visit's Progress  ?  Patient Stated     ?  Stay healthy and happy  ?  ? ?  ? ?Depression Screen ? ?  04/17/2022  ?  9:18 AM 02/20/2022  ? 11:59 AM 07/22/2021  ?  8:28 AM 01/10/2021  ?  4:10 PM 10/24/2018  ? 12:56 PM 02/18/2018  ? 11:05 AM 07/01/2017  ? 10:15 AM  ?PHQ 2/9 Scores  ?PHQ - 2 Score 0 0 4 0 3 1 2   ?PHQ- 9 Score   15  8  3   ?  ?Fall Risk ? ?  04/17/2022  ?  9:11 AM 02/20/2022  ? 11:59 AM 02/18/2018  ? 11:05 AM 07/01/2017  ? 10:15 AM 12/28/2014  ?  1:54 PM  ?Fall Risk   ?Falls in the past year? 0 0 No No Yes  ?Number falls in past yr: 0 0   1  ?Injury with Fall? 0 0   No  ?Risk for fall due to : Orthopedic patient      ?Follow  up Falls prevention discussed;Education provided Falls evaluation completed     ? ? ?FALL RISK PREVENTION PERTAINING TO THE HOME: ? ?Any stairs in or around the home? No  ?If so, are there any without han

## 2022-04-18 ENCOUNTER — Ambulatory Visit
Admission: RE | Admit: 2022-04-18 | Discharge: 2022-04-18 | Disposition: A | Discharge status: Home / Self Care | Payer: Medicare PPO | Source: Ambulatory Visit | Attending: Neurology | Admitting: Neurology

## 2022-04-18 DIAGNOSIS — G35 Multiple sclerosis: Secondary | ICD-10-CM | POA: Diagnosis not present

## 2022-04-18 DIAGNOSIS — M542 Cervicalgia: Secondary | ICD-10-CM | POA: Diagnosis not present

## 2022-04-18 MED ORDER — GADOBUTROL 1 MMOL/ML IV SOLN
10.0000 mL | Freq: Once | INTRAVENOUS | Status: AC | PRN
  Administered 2022-04-18: 10 mL via INTRAVENOUS

## 2022-04-22 ENCOUNTER — Encounter: Payer: Self-pay | Admitting: Nurse Practitioner

## 2022-04-22 ENCOUNTER — Ambulatory Visit (INDEPENDENT_AMBULATORY_CARE_PROVIDER_SITE_OTHER): Payer: Medicare PPO | Admitting: Nurse Practitioner

## 2022-04-22 VITALS — BP 140/98 | HR 76 | Temp 97.4°F | Resp 16 | Wt 251.0 lb

## 2022-04-22 DIAGNOSIS — I10 Essential (primary) hypertension: Secondary | ICD-10-CM | POA: Diagnosis not present

## 2022-04-22 MED ORDER — LOSARTAN POTASSIUM 25 MG PO TABS: 25.0000 mg | ORAL_TABLET | Freq: Every day | ORAL | 1 refills | Status: DC

## 2022-04-22 NOTE — Progress Notes (Signed)
? ?  Established Patient Office Visit ? ?Subjective   ?Patient ID: Kathy Gamble, female    DOB: 07-Mar-1976  Age: 46 y.o. MRN: [id number] ? ?Chief Complaint  ?Patient presents with  ? Hypertension  ?  Follow up   ? ? ?HPI ? ?HTN: states that she is taking the medication 2 pills daily. States that she is doing ok with the medication at home. Checking blood pressure at home. States she is checking it 3 times a week.  ? ?States she will do the eplitital 30 mins 2 times a week ? ? ? ?Review of Systems  ?Constitutional:  Negative for chills and fever.  ?Cardiovascular:  Negative for chest pain and leg swelling.  ?Neurological:  Positive for headaches. Negative for dizziness.  ? ?  ?Objective:  ?  ? ?BP (!) 140/98   Pulse 76   Temp (!) 97.4 ?F (36.3 ?C)   Resp 16   Wt 251 lb (113.9 kg)   SpO2 97%   BMI 38.73 kg/m?  ?BP Readings from Last 3 Encounters:  ?04/22/22 (!) 140/98  ?03/31/22 (!) 140/110  ?03/18/22 132/87  ? ?Wt Readings from Last 3 Encounters:  ?04/22/22 251 lb (113.9 kg)  ?04/17/22 248 lb (112.5 kg)  ?03/31/22 248 lb (112.5 kg)  ? ?  ? ?Physical Exam ?Vitals and nursing note reviewed.  ?Constitutional:   ?   Appearance: Normal appearance.  ?Cardiovascular:  ?   Rate and Rhythm: Normal rate and regular rhythm.  ?   Heart sounds: Normal heart sounds.  ?Pulmonary:  ?   Effort: Pulmonary effort is normal.  ?   Breath sounds: Normal breath sounds.  ?Abdominal:  ?   General: Bowel sounds are normal.  ?Musculoskeletal:  ?   Right lower leg: No edema.  ?   Left lower leg: No edema.  ?Neurological:  ?   Mental Status: She is alert.  ? ? ? ?No results found for any visits on 04/22/22. ? ? ? ?The 10-year ASCVD risk score (Arnett DK, et al., 2019) is: 0.9% ? ?  ?Assessment & Plan:  ? ?Problem List Items Addressed This Visit   ? ?  ? Cardiovascular and Mediastinum  ? Primary hypertension - Primary  ?  Patient has been checking blood pressures at home but does not have a list with her today.  States she is taking 10 mg of  amlodipine daily.  We will add on losartan 25 mg.  Continue taking her blood pressure at home and increase physical activity to 3-4 times a week.  We will have patient come back in 1 month for blood pressure recheck and BMP to make sure she not hyperkalemic or had an AKI. ? ?  ?  ? Relevant Medications  ? losartan (COZAAR) 25 MG tablet  ? ? ?Return in about 4 weeks (around 05/20/2022) for BP recheck and BMP.  ? ? ?Audria Nine, NP ? ?

## 2022-04-22 NOTE — Patient Instructions (Signed)
Nice to see you today ?Continue taking amlodipine 5 mg. Take 2 tablets at the same time a day every day. I am adding another blood pressure pill on called losartan 25mg . You will take this once a day along with the amlodipine. ? ?I need you to call back and tell the people on the phone your blood pressure numbers that you have written down ? ?I NEED TO SEE YOU IN 1 MONTH for a recheck on blood and blood pressure ? ?Continue working on the exercise try doing it 3-4 times a week ?

## 2022-04-22 NOTE — Assessment & Plan Note (Signed)
Patient has been checking blood pressures at home but does not have a list with her today.  States she is taking 10 mg of amlodipine daily.  We will add on losartan 25 mg.  Continue taking her blood pressure at home and increase physical activity to 3-4 times a week.  We will have patient come back in 1 month for blood pressure recheck and BMP to make sure she not hyperkalemic or had an AKI. ?

## 2022-04-23 ENCOUNTER — Telehealth: Payer: Self-pay

## 2022-04-23 NOTE — Telephone Encounter (Signed)
Noted! Thank you

## 2022-04-23 NOTE — Telephone Encounter (Signed)
[place] [facility] Night - Client ?Nonclinical Telephone Record  ?AccessNurse? ?Client [place] [facility] Night - Client ?Client Site  [facility] [place] - Night ?Provider Audria Nine- NP ?Contact Type Call ?Who Is Calling Patient / Member / Family / Caregiver ?[name] Name Camiah Humm ?[name] Phone Number [phone] ?Patient Name Kathy Gamble ?Patient DOB 11-28-1976 ?Call Type Message Only Information Provided ?Reason for Call Request for General Office Information ?Initial Comment [name] states she wants to leave some blood pressure readings 135/95 pulse 89, 136/100 pulse ?77, 162/111 pulse 63, 134/93 pulse 71, 136/95 pulse 76 131/89 pulse 73 133/93 pulse 74 ?Disp. Time Disposition Final User ?04/22/2022 5:15:37 PM General Information Provided Yes Puentes, Yvette ?Call Closed By: Horace Porteous ?Transaction Date/Time: 04/22/2022 5:09:44 PM (ET ?

## 2022-04-27 ENCOUNTER — Other Ambulatory Visit: Payer: Self-pay | Admitting: Nurse Practitioner

## 2022-04-27 DIAGNOSIS — Z1231 Encounter for screening mammogram for malignant neoplasm of breast: Secondary | ICD-10-CM

## 2022-05-15 ENCOUNTER — Telehealth: Payer: Self-pay | Admitting: Nurse Practitioner

## 2022-05-15 ENCOUNTER — Ambulatory Visit: Payer: Medicare PPO | Admitting: Family Medicine

## 2022-05-15 NOTE — Telephone Encounter (Signed)
Err

## 2022-05-15 NOTE — Telephone Encounter (Signed)
Patient left b/p readings: 04/30/22 130/103 pulse 82 05/07/22 126/92 pulse 73 05/12/2022 120/90 pulse 73

## 2022-05-18 ENCOUNTER — Ambulatory Visit (INDEPENDENT_AMBULATORY_CARE_PROVIDER_SITE_OTHER): Payer: Medicare PPO | Admitting: Family

## 2022-05-18 ENCOUNTER — Encounter: Payer: Self-pay | Admitting: Family

## 2022-05-18 VITALS — BP 128/86 | HR 74 | Temp 97.6°F | Resp 16 | Ht 67.5 in | Wt 255.5 lb

## 2022-05-18 DIAGNOSIS — S90821A Blister (nonthermal), right foot, initial encounter: Secondary | ICD-10-CM | POA: Diagnosis not present

## 2022-05-18 NOTE — Telephone Encounter (Signed)
Blood pressures seem to be better. She was suppose to have a month follow up with me and we will discuss BP and she will need labs rechecked

## 2022-05-18 NOTE — Telephone Encounter (Signed)
Left message to call back When patient calls back please schedule a f/u 40 minute appt sometime later in June with Audria Nine. Thank you

## 2022-05-18 NOTE — Assessment & Plan Note (Signed)
Recommend otc blister bandaid Goal is to keep blister roofed and free of infection Wear supportive shoes and socks Handout given to pt  F/u with pcp if s/s infection or getting worse

## 2022-05-18 NOTE — Patient Instructions (Signed)
Get some bandaids for blisters at the drug store and place on foot until healed  Due to recent changes in healthcare laws, you may see results of your imaging and/or laboratory studies on MyChart before I have had a chance to review them.  I understand that in some cases there may be results that are confusing or concerning to you. Please understand that not all results are received at the same time and often I may need to interpret multiple results in order to provide you with the best plan of care or course of treatment. Therefore, I ask that you please give me 2 business days to thoroughly review all your results before contacting my office for clarification. Should we see a critical lab result, you will be contacted sooner.   It was a pleasure seeing you today! Please do not hesitate to reach out with any questions and or concerns.  Regards,   Eugenia Pancoast FNP-C

## 2022-05-18 NOTE — Progress Notes (Signed)
Established Patient Office Visit  Subjective:  Patient ID: Kathy Gamble, female    DOB: 26-Sep-1976  Age: 46 y.o. MRN: [id number]  CC:  Chief Complaint  Patient presents with   Foot Pain    Sore on left foot X 1 week it was hard but now is soft     HPI Kathy Gamble is here today with concerns.   Noticed a brown lesion on her left foot about one week ago. Was brown around it with a red line around it, starting to get better. Now is softer. No pain with walking on it.  Took some tylenol and ibuprofen last week with some relief of symptoms   Past Medical History:  Diagnosis Date   Allergy    Bee sting allergy 03/02/2015   Chicken pox    Frequent headaches    GERD (gastroesophageal reflux disease)     Past Surgical History:  Procedure Laterality Date   ADENOIDECTOMY     TONSILLECTOMY      Family History  Problem Relation Age of Onset   Stroke Mother    Mental retardation Mother    Cancer Maternal Grandmother    Cancer Paternal Grandmother        Breast   Heart disease Paternal Grandfather     Social History   Socioeconomic History   Marital status: Married    Spouse name: Not on file   Number of children: Not on file   Years of education: Not on file   Highest education level: Not on file  Occupational History   Occupation: disabled  Tobacco Use   Smoking status: Never   Smokeless tobacco: Never  Vaping Use   Vaping Use: Never used  Substance and Sexual Activity   Alcohol use: No    Alcohol/week: 0.0 standard drinks   Drug use: Not Currently    Types: Cocaine   Sexual activity: Yes    Birth control/protection: None  Other Topics Concern   Not on file  Social History Narrative   Cognitive dysfunction - lives with her husband   Social Determinants of Health   Financial Resource Strain: Low Risk    Difficulty of Paying Living Expenses: Not hard at all  Food Insecurity: No Food Insecurity   Worried About Programme researcher, broadcasting/film/video in the Last Year: Never  true   Barista in the Last Year: Never true  Transportation Needs: No Transportation Needs   Lack of Transportation (Medical): No   Lack of Transportation (Non-Medical): No  Physical Activity: Insufficiently Active   Days of Exercise per Week: 7 days   Minutes of Exercise per Session: 20 min  Stress: Stress Concern Present   Feeling of Stress : To some extent  Social Connections: Press photographer of Communication with Friends and Family: More than three times a week   Frequency of Social Gatherings with Friends and Family: More than three times a week   Attends Religious Services: 1 to 4 times per year   Active Member of [employer] or Organizations: Yes   Attends Banker Meetings: 1 to 4 times per year   Marital Status: Married  Catering manager Violence: Not At Risk   Fear of Current or Ex-Partner: No   Emotionally Abused: No   Physically Abused: No   Sexually Abused: No    Outpatient Medications Prior to Visit  Medication Sig Dispense Refill   albuterol (VENTOLIN HFA) 108 (90 Base) MCG/ACT inhaler INHALE  1-2 PUFFS into THE lungs EVERY 6 HOURS AS NEEDED FOR SHORTNESS OF BREATH OR wheezing 18 g 0   amLODipine (NORVASC) 10 MG tablet Take 1 tablet (10 mg total) by mouth daily. 90 tablet 0   gabapentin (NEURONTIN) 300 MG capsule Take by mouth.     levocetirizine (XYZAL) 5 MG tablet TAKE ONE TABLET BY MOUTH ONCE DAILY AS NEEDED 90 tablet 2   losartan (COZAAR) 25 MG tablet Take 1 tablet (25 mg total) by mouth daily. 30 tablet 1   montelukast (SINGULAIR) 10 MG tablet TAKE ONE TABLET BY MOUTH ONCE DAILY FOR sinuses 90 tablet 3   Multiple Vitamin (MULTIVITAMIN) capsule Take 1 capsule by mouth daily.     omeprazole (PRILOSEC) 20 MG capsule TAKE ONE CAPSULE BY MOUTH ONCE DAILY 90 capsule 3   oxymetazoline (AFRIN) 0.05 % nasal spray Place 2 sprays into both nostrils 2 (two) times daily. Do not use for more than 3 days in a row 15 mL 1   venlafaxine (EFFEXOR) 75  MG tablet Take by mouth.     No facility-administered medications prior to visit.    Allergies  Allergen Reactions   Bee Venom Itching   Nortriptyline Other (See Comments)    GI distress   Latex Rash    The powder in the gloves causes rash        Objective:    Physical Exam Constitutional:      General: She is not in acute distress.    Appearance: Normal appearance. She is obese. She is not ill-appearing, toxic-appearing or diaphoretic.  Pulmonary:     Effort: Pulmonary effort is normal.  Skin:    Comments: Roofed blister base of right foot, mid upper pad. Nontender to touch. Clear fluid internally appears to be healing. No surrounding erythema.  Neurological:     General: No focal deficit present.     Mental Status: She is alert and oriented to person, place, and time.  Psychiatric:        Mood and Affect: Mood normal.        Behavior: Behavior normal.        Thought Content: Thought content normal.        Judgment: Judgment normal.    BP 128/86   Pulse 74   Temp 97.6 F (36.4 C)   Resp 16   Ht 5' 7.5" (1.715 m)   Wt 255 lb 8 oz (115.9 kg)   SpO2 98%   BMI 39.43 kg/m  Wt Readings from Last 3 Encounters:  05/18/22 255 lb 8 oz (115.9 kg)  04/22/22 251 lb (113.9 kg)  04/17/22 248 lb (112.5 kg)     Health Maintenance Due  Topic Date Due   HIV Screening  Never done   Hepatitis C Screening  Never done   COVID-19 Vaccine (4 - Booster for Pfizer series) 02/05/2021   COLONOSCOPY (Pts 45-71yrs Insurance coverage will need to be confirmed)  Never done    There are no preventive care reminders to display for this patient.  Lab Results  Component Value Date   TSH 1.83 12/03/2021   Lab Results  Component Value Date   WBC 6.2 03/18/2022   HGB 12.3 03/18/2022   HCT 37.0 03/18/2022   MCV 87.5 03/18/2022   PLT 242 03/18/2022   Lab Results  Component Value Date   NA 141 03/18/2022   K 3.8 03/18/2022   CO2 27 03/18/2022   GLUCOSE 96 03/18/2022   BUN 14  03/18/2022  CREATININE 0.62 03/18/2022   BILITOT 0.4 03/18/2022   ALKPHOS 103 03/18/2022   AST 14 (L) 03/18/2022   ALT 13 03/18/2022   PROT 6.3 (L) 03/18/2022   ALBUMIN 3.3 (L) 03/18/2022   CALCIUM 8.4 (L) 03/18/2022   ANIONGAP 5 03/18/2022   GFR 105.08 12/03/2021   Lab Results  Component Value Date   HGBA1C 4.9 12/03/2021      Assessment & Plan:   Problem List Items Addressed This Visit       Musculoskeletal and Integument   Blister of right foot - Primary    Recommend otc blister bandaid Goal is to keep blister roofed and free of infection Wear supportive shoes and socks Handout given to pt  F/u with pcp if s/s infection or getting worse         No orders of the defined types were placed in this encounter.   Follow-up: Return if symptoms worsen or fail to improve with pcp.    Mort Sawyers, FNP

## 2022-05-21 NOTE — Telephone Encounter (Signed)
Patient advised and appointment made for 06/04/22

## 2022-05-26 ENCOUNTER — Telehealth: Payer: Self-pay | Admitting: Nurse Practitioner

## 2022-05-26 ENCOUNTER — Ambulatory Visit
Admission: RE | Admit: 2022-05-26 | Discharge: 2022-05-26 | Disposition: A | Discharge status: Home / Self Care | Payer: Medicare PPO | Source: Ambulatory Visit | Attending: Nurse Practitioner | Admitting: Nurse Practitioner

## 2022-05-26 DIAGNOSIS — Z1231 Encounter for screening mammogram for malignant neoplasm of breast: Secondary | ICD-10-CM | POA: Insufficient documentation

## 2022-05-26 NOTE — Telephone Encounter (Signed)
Patient would like call on what kind of nasal spray to use. Please advise.

## 2022-05-27 NOTE — Telephone Encounter (Signed)
Called patient reviewed all information and repeated back to me. Will call if any questions.  ? ?

## 2022-05-27 NOTE — Telephone Encounter (Signed)
Patient called back states that she has used afrin that was given by our office "a while back". She had one that was delivered yesterday for nose bleeds. Nostrilla was the name of the new one. She was not sure if they are the same thing or if she needs to be taking one or the other.   Patient is having some pain and fullness in ears after being outdoors.

## 2022-05-27 NOTE — Telephone Encounter (Signed)
The afrin and nostrilla are the same type of medication. She can use either but needs to limit the use to just 3 days and no more. She should be on an antihistamine already called levocetirizine

## 2022-05-27 NOTE — Telephone Encounter (Signed)
Left message to call back. What symptoms does patient have? What spray if any she has been using

## 2022-06-01 DIAGNOSIS — G479 Sleep disorder, unspecified: Secondary | ICD-10-CM | POA: Diagnosis not present

## 2022-06-01 DIAGNOSIS — F411 Generalized anxiety disorder: Secondary | ICD-10-CM | POA: Diagnosis not present

## 2022-06-01 DIAGNOSIS — R937 Abnormal findings on diagnostic imaging of other parts of musculoskeletal system: Secondary | ICD-10-CM | POA: Diagnosis not present

## 2022-06-01 DIAGNOSIS — R519 Headache, unspecified: Secondary | ICD-10-CM | POA: Diagnosis not present

## 2022-06-04 ENCOUNTER — Ambulatory Visit: Payer: Medicare PPO | Admitting: Nurse Practitioner

## 2022-06-14 ENCOUNTER — Other Ambulatory Visit: Payer: Self-pay | Admitting: Nurse Practitioner

## 2022-06-14 DIAGNOSIS — I10 Essential (primary) hypertension: Secondary | ICD-10-CM

## 2022-06-15 NOTE — Telephone Encounter (Signed)
Spoke with Susy Frizzle, patient is already scheduled for 06/29/22 for f/u b/p and labs. Ok to wait on labs and refill.

## 2022-06-19 ENCOUNTER — Ambulatory Visit (INDEPENDENT_AMBULATORY_CARE_PROVIDER_SITE_OTHER): Payer: Medicare PPO | Admitting: Nurse Practitioner

## 2022-06-19 ENCOUNTER — Encounter: Payer: Self-pay | Admitting: Nurse Practitioner

## 2022-06-19 VITALS — BP 122/80 | HR 85 | Temp 98.6°F | Resp 16 | Ht 67.5 in | Wt 250.2 lb

## 2022-06-19 DIAGNOSIS — J0140 Acute pansinusitis, unspecified: Secondary | ICD-10-CM | POA: Diagnosis not present

## 2022-06-19 DIAGNOSIS — I10 Essential (primary) hypertension: Secondary | ICD-10-CM

## 2022-06-19 DIAGNOSIS — L740 Miliaria rubra: Secondary | ICD-10-CM | POA: Diagnosis not present

## 2022-06-19 MED ORDER — AMOXICILLIN-POT CLAVULANATE 875-125 MG PO TABS: 1.0000 | ORAL_TABLET | Freq: Two times a day (BID) | ORAL | 0 refills | Status: AC

## 2022-06-19 NOTE — Assessment & Plan Note (Signed)
Given length of illness and physical exam will like to treat patient with Augmentin 875-125 twice daily.  Follow-up if no improvement

## 2022-06-19 NOTE — Assessment & Plan Note (Signed)
Likely heat rash/chafing from moisture and skin touching and rubbing.  Did go over hygiene and keeping skin clean and dry.  She can use barrier cream as needed to the area.  Also for patient to use loosefitting close and try to stay cool while rash heals

## 2022-06-19 NOTE — Assessment & Plan Note (Signed)
Patient currently maintained on amlodipine and losartan.  Today's blood pressure in previous office of blood pressure within normal limits.  We will check BMP today to make sure patient's potassium and kidney function is appropriate.  She can follow-up as needed until her physical in December.  Taking amlodipine and losartan

## 2022-06-19 NOTE — Progress Notes (Signed)
Established Patient Office Visit  Subjective   Patient ID: Kathy Gamble, female    DOB: 02-03-1976  Age: 46 y.o. MRN: [id number]  Chief Complaint  Patient presents with   Sinus Problem    X 2 weeks happens every time she goes outside. Spitting up green stuff      Sinus problem: for approx 2 weeks. States no sick contacts. States that her best friend has been sick but placed on antibiotics for it. No over the counter medications tried thus far. LMP: unsure  Blood on toliet tissue. States that her "butt is raw". States that she has had this issue for approx: Patient cannot give a length of time Last BM was this morning that was soft.  Bottom itches also per patient report    Review of Systems  Constitutional:  Positive for malaise/fatigue. Negative for chills and fever.  HENT:  Positive for ear pain and sinus pain. Negative for ear discharge and sore throat.   Respiratory:  Positive for cough.   Neurological:  Positive for headaches.      Objective:     BP 122/80   Pulse 85   Temp 98.6 F (37 C)   Resp 16   Ht 5' 7.5" (1.715 m)   Wt 250 lb 4 oz (113.5 kg)   SpO2 99%   BMI 38.62 kg/m    Physical Exam Vitals and nursing note reviewed. Exam conducted with a chaperone present Annabell Sabal, CMA).  Constitutional:      Appearance: Normal appearance. She is obese.  HENT:     Right Ear: Tympanic membrane, ear canal and external ear normal.     Left Ear: Tympanic membrane, ear canal and external ear normal.     Nose:     Right Sinus: Maxillary sinus tenderness and frontal sinus tenderness present.     Left Sinus: Maxillary sinus tenderness and frontal sinus tenderness present.  Cardiovascular:     Rate and Rhythm: Normal rate and regular rhythm.     Heart sounds: Normal heart sounds.  Pulmonary:     Effort: Pulmonary effort is normal.     Breath sounds: Normal breath sounds.  Abdominal:     General: Bowel sounds are normal. There is no distension.      Palpations: There is no mass.     Tenderness: There is no abdominal tenderness.     Hernia: No hernia is present.  Genitourinary:      Comments: Area of heat rash/chafing. Skin:    Findings: Rash present.  Neurological:     Mental Status: She is alert.      No results found for any visits on 06/19/22.    The 10-year ASCVD risk score (Arnett DK, et al., 2019) is: 0.6%    Assessment & Plan:   Problem List Items Addressed This Visit       Cardiovascular and Mediastinum   Primary hypertension    Patient currently maintained on amlodipine and losartan.  Today's blood pressure in previous office of blood pressure within normal limits.  We will check BMP today to make sure patient's potassium and kidney function is appropriate.  She can follow-up as needed until her physical in December.  Taking amlodipine and losartan      Relevant Orders   Comprehensive metabolic panel   CBC     Respiratory   Acute non-recurrent pansinusitis - Primary    Given length of illness and physical exam will like to treat patient with Augmentin  875-125 twice daily.  Follow-up if no improvement      Relevant Medications   amoxicillin-clavulanate (AUGMENTIN) 875-125 MG tablet     Musculoskeletal and Integument   Heat rash    Likely heat rash/chafing from moisture and skin touching and rubbing.  Did go over hygiene and keeping skin clean and dry.  She can use barrier cream as needed to the area.  Also for patient to use loosefitting close and try to stay cool while rash heals       Return As scheduled in December.    Audria Nine, NP

## 2022-06-19 NOTE — Patient Instructions (Addendum)
I sent in antibiotics for the sinus infection You can get some barrier cream to place on your bottom. Make sure to keep the area dry and clean Your blood pressure looks good today I will see you in December for you physical, Sooner if you need me.

## 2022-06-20 LAB — COMPREHENSIVE METABOLIC PANEL
AG Ratio: 1.6 (calc) (ref 1.0–2.5)
ALT: 11 U/L (ref 6–29)
AST: 11 U/L (ref 10–35)
Albumin: 3.8 g/dL (ref 3.6–5.1)
Alkaline phosphatase (APISO): 143 U/L — ABNORMAL HIGH (ref 31–125)
BUN: 11 mg/dL (ref 7–25)
CO2: 28 mmol/L (ref 20–32)
Calcium: 8.7 mg/dL (ref 8.6–10.2)
Chloride: 108 mmol/L (ref 98–110)
Creat: 0.75 mg/dL (ref 0.50–0.99)
Globulin: 2.4 g/dL (calc) (ref 1.9–3.7)
Glucose, Bld: 90 mg/dL (ref 65–99)
Potassium: 4 mmol/L (ref 3.5–5.3)
Sodium: 143 mmol/L (ref 135–146)
Total Bilirubin: 0.3 mg/dL (ref 0.2–1.2)
Total Protein: 6.2 g/dL (ref 6.1–8.1)

## 2022-06-20 LAB — CBC
HCT: 38.2 % (ref 35.0–45.0)
Hemoglobin: 12.8 g/dL (ref 11.7–15.5)
MCH: 30.1 pg (ref 27.0–33.0)
MCHC: 33.5 g/dL (ref 32.0–36.0)
MCV: 89.9 fL (ref 80.0–100.0)
MPV: 11.1 fL (ref 7.5–12.5)
Platelets: 285 10*3/uL (ref 140–400)
RBC: 4.25 10*6/uL (ref 3.80–5.10)
RDW: 12.7 % (ref 11.0–15.0)
WBC: 6.5 10*3/uL (ref 3.8–10.8)

## 2022-06-22 ENCOUNTER — Telehealth: Payer: Self-pay

## 2022-06-22 NOTE — Telephone Encounter (Signed)
Patient called in stating that she was seen by [facility] for the heat rash. Was supposed to be prescribed a cream but her pharmacy does not have anything, asking if we can give her a call back in regards to this.

## 2022-06-22 NOTE — Telephone Encounter (Signed)
Spoke to pt and  told her what provider has told me. She can get the medication OTC.

## 2022-06-25 ENCOUNTER — Telehealth: Payer: Self-pay | Admitting: Nurse Practitioner

## 2022-06-25 NOTE — Telephone Encounter (Signed)
Follow the directions/instructions on the tube/box. This is to be used externally on her bottom. She does not need to use it internally

## 2022-06-25 NOTE — Telephone Encounter (Signed)
Patient was prescribed desitin ,and would like to know how she should use it.

## 2022-06-25 NOTE — Telephone Encounter (Signed)
Patient called back and is aware of Matt's message below on how to use

## 2022-06-25 NOTE — Telephone Encounter (Signed)
Noted thank you

## 2022-06-25 NOTE — Telephone Encounter (Signed)
Called patient's phone [phone] her husband [name] answered and said that he has patient's phone and patient has his phone, he is at the hospital. I called [phone]-left message to call back

## 2022-06-25 NOTE — Telephone Encounter (Signed)
Any particular direction on this or does patient need to look at the tube for directions?

## 2022-06-29 ENCOUNTER — Ambulatory Visit: Payer: Medicare PPO | Admitting: Nurse Practitioner

## 2022-06-29 NOTE — Telephone Encounter (Signed)
Pt called in requesting a call back regarding medication concern about RX that is not currently on her med list . Please advise #[phone]

## 2022-06-30 NOTE — Telephone Encounter (Signed)
Left message to call back. Please get more information on what the medication is and what is the question so I can ask provider about this.

## 2022-07-01 NOTE — Telephone Encounter (Signed)
She should not be taking the topiramate. This medication was discontinued as it was not effective. If she is having headaches she can contact her neurologist Dr. Malvin Johns

## 2022-07-01 NOTE — Telephone Encounter (Signed)
I spoke with pt; pt wants to know if Audria Nine NP wants pt to continue taking Topiramate 25 mg. Pt spelled the med 4 x with significant different spellings each time until got Topiramate 25 mg. Pt said last time she took med was 2000, then when asked again pt said this year 2014 and I explained 2014 was not this year and pt apologized and said she last took med 2 wks ago. Pt is out of med and is not sure if Susy Frizzle wants pt to continue taking the med or not. Topiramate 25 mg is on hx med list and noted completed course. Pt said having head and mid back pain and legs swelling. Pt does not want to schedule appt but does want cb after reviewed by Audria Nine NP to see if pt needs new rx for Topiramate 25 mg to [facility] for delivery. Sending note to Audria Nine NP and Anastasiya CMA.

## 2022-07-01 NOTE — Telephone Encounter (Signed)
Patient advised and verbalized understanding 

## 2022-07-02 NOTE — Telephone Encounter (Signed)
Patient call to return a call.

## 2022-07-03 NOTE — Telephone Encounter (Signed)
Left detailed message for patient letting her know that I did not call since we talked on 07/01/22.

## 2022-07-03 NOTE — Telephone Encounter (Signed)
Patient states she is returning a call from yesterday, did not see any documentation.

## 2022-07-16 ENCOUNTER — Other Ambulatory Visit: Payer: Self-pay | Admitting: Nurse Practitioner

## 2022-07-16 DIAGNOSIS — I10 Essential (primary) hypertension: Secondary | ICD-10-CM

## 2022-07-28 DIAGNOSIS — R519 Headache, unspecified: Secondary | ICD-10-CM | POA: Diagnosis not present

## 2022-07-28 DIAGNOSIS — G35 Multiple sclerosis: Secondary | ICD-10-CM | POA: Diagnosis not present

## 2022-07-28 DIAGNOSIS — F411 Generalized anxiety disorder: Secondary | ICD-10-CM | POA: Diagnosis not present

## 2022-07-28 DIAGNOSIS — R937 Abnormal findings on diagnostic imaging of other parts of musculoskeletal system: Secondary | ICD-10-CM | POA: Diagnosis not present

## 2022-07-28 DIAGNOSIS — G479 Sleep disorder, unspecified: Secondary | ICD-10-CM | POA: Diagnosis not present

## 2022-08-14 ENCOUNTER — Ambulatory Visit (INDEPENDENT_AMBULATORY_CARE_PROVIDER_SITE_OTHER): Payer: Medicare PPO | Admitting: Family Medicine

## 2022-08-14 ENCOUNTER — Ambulatory Visit (INDEPENDENT_AMBULATORY_CARE_PROVIDER_SITE_OTHER)
Admission: RE | Admit: 2022-08-14 | Discharge: 2022-08-14 | Disposition: A | Discharge status: Home / Self Care | Payer: Medicare PPO | Source: Ambulatory Visit | Attending: Family Medicine | Admitting: Family Medicine

## 2022-08-14 ENCOUNTER — Encounter: Payer: Self-pay | Admitting: Family Medicine

## 2022-08-14 VITALS — BP 120/90 | HR 99 | Temp 98.3°F | Ht 69.0 in | Wt 248.0 lb

## 2022-08-14 DIAGNOSIS — M79674 Pain in right toe(s): Secondary | ICD-10-CM | POA: Diagnosis not present

## 2022-08-14 DIAGNOSIS — L03031 Cellulitis of right toe: Secondary | ICD-10-CM | POA: Diagnosis not present

## 2022-08-14 DIAGNOSIS — J4530 Mild persistent asthma, uncomplicated: Secondary | ICD-10-CM | POA: Diagnosis not present

## 2022-08-14 DIAGNOSIS — B351 Tinea unguium: Secondary | ICD-10-CM | POA: Insufficient documentation

## 2022-08-14 DIAGNOSIS — J302 Other seasonal allergic rhinitis: Secondary | ICD-10-CM | POA: Diagnosis not present

## 2022-08-14 DIAGNOSIS — R0981 Nasal congestion: Secondary | ICD-10-CM | POA: Insufficient documentation

## 2022-08-14 MED ORDER — ALBUTEROL SULFATE HFA 108 (90 BASE) MCG/ACT IN AERS: INHALATION_SPRAY | RESPIRATORY_TRACT | 0 refills | Status: DC

## 2022-08-14 MED ORDER — CEPHALEXIN 500 MG PO CAPS: 500.0000 mg | ORAL_CAPSULE | Freq: Three times a day (TID) | ORAL | 0 refills | Status: DC

## 2022-08-14 NOTE — Assessment & Plan Note (Signed)
Following injury to nailbed it appears there is likely cellulitis at the edge of the nailbed causing pain, swelling and increased warmth.  No abscess palpated  We will treat with Keflex 500 mg p.o. 3 times daily x7 days

## 2022-08-14 NOTE — Progress Notes (Signed)
Patient ID: Kathy Gamble, female    DOB: [birth year], 46 y.o.   MRN: [id number]  This visit was conducted in person.  Pulse 99   Temp 98.3 F (36.8 C) (Oral)   Ht 5\' 9"  (1.753 m)   Wt 248 lb (112.5 kg)   LMP  (LMP Unknown)   SpO2 97%   BMI 36.62 kg/m    CC:  Chief Complaint  Patient presents with   Swollen Toe    Left Big Toe    Subjective:   HPI: Kathy Gamble is a 46 y.o. female presenting on 08/14/2022 for Swollen Toe (Left Big Toe)  She is noted new onset pain in right great toe in last 1-2 weeks. Start after she dropped a can on right toe nail.  Increase swelling in right leg.  She  Has been icing toe.  She has had history of fungal infection in Bilateral great toes, s/p removal in past but they grew.  She able to weight bear.  Using tylenol for pain.  She is on gabapentin for chronic headaches per Dr. 10/14/2022.  Last office visit note from July 28, 2022 was noted.  She was instructed at that point to continue Effexor 75 mg twice daily and increase gabapentin to 400 mg at night. No history of diabetes.    Having increase in mucus in nose and ear ringing.  Positive for COVID 2 weeks ago.  No current wheeze or SOB. Relevant past medical, surgical, family and social history reviewed and updated as indicated. Interim medical history since our last visit reviewed. Allergies and medications reviewed and updated. Outpatient Medications Prior to Visit  Medication Sig Dispense Refill   albuterol (VENTOLIN HFA) 108 (90 Base) MCG/ACT inhaler INHALE 1-2 PUFFS into THE lungs EVERY 6 HOURS AS NEEDED FOR SHORTNESS OF BREATH OR wheezing 18 g 0   amLODipine (NORVASC) 10 MG tablet Take 1 tablet (10 mg total) by mouth daily. 90 tablet 0   gabapentin (NEURONTIN) 300 MG capsule Take by mouth.     levocetirizine (XYZAL) 5 MG tablet TAKE ONE TABLET BY MOUTH ONCE DAILY AS NEEDED 90 tablet 2   losartan (COZAAR) 25 MG tablet TAKE ONE TABLET BY MOUTH ONCE DAILY 90 tablet 1   montelukast  (SINGULAIR) 10 MG tablet TAKE ONE TABLET BY MOUTH ONCE DAILY FOR sinuses 90 tablet 3   Multiple Vitamin (MULTIVITAMIN) capsule Take 1 capsule by mouth daily.     omeprazole (PRILOSEC) 20 MG capsule TAKE ONE CAPSULE BY MOUTH ONCE DAILY 90 capsule 3   oxymetazoline (AFRIN) 0.05 % nasal spray Place 2 sprays into both nostrils 2 (two) times daily. Do not use for more than 3 days in a row 15 mL 1   venlafaxine (EFFEXOR) 75 MG tablet Take by mouth.     No facility-administered medications prior to visit.     Per HPI unless specifically indicated in ROS section below Review of Systems  Constitutional:  Negative for fatigue and fever.  HENT:  Negative for congestion.   Eyes:  Negative for pain.  Respiratory:  Negative for cough and shortness of breath.   Cardiovascular:  Negative for chest pain, palpitations and leg swelling.  Gastrointestinal:  Negative for abdominal pain.  Genitourinary:  Negative for dysuria and vaginal bleeding.  Musculoskeletal:  Negative for back pain.  Neurological:  Negative for syncope, light-headedness and headaches.  Psychiatric/Behavioral:  Negative for dysphoric mood.    Objective:  Pulse 99   Temp 98.3 F (36.8  C) (Oral)   Ht 5\' 9"  (1.753 m)   Wt 248 lb (112.5 kg)   LMP  (LMP Unknown)   SpO2 97%   BMI 36.62 kg/m   Wt Readings from Last 3 Encounters:  08/14/22 248 lb (112.5 kg)  06/19/22 250 lb 4 oz (113.5 kg)  05/18/22 255 lb 8 oz (115.9 kg)      Physical Exam Constitutional:      General: She is not in acute distress.    Appearance: Normal appearance. She is well-developed. She is not ill-appearing or toxic-appearing.  HENT:     Head: Normocephalic.     Right Ear: Hearing, tympanic membrane, ear canal and external ear normal. Tympanic membrane is not erythematous, retracted or bulging.     Left Ear: Hearing, tympanic membrane, ear canal and external ear normal. Tympanic membrane is not erythematous, retracted or bulging.     Nose: No mucosal edema  or rhinorrhea.     Right Sinus: No maxillary sinus tenderness or frontal sinus tenderness.     Left Sinus: No maxillary sinus tenderness or frontal sinus tenderness.     Mouth/Throat:     Pharynx: Uvula midline.  Eyes:     General: Lids are normal. Lids are everted, no foreign bodies appreciated.     Conjunctiva/sclera: Conjunctivae normal.     Pupils: Pupils are equal, round, and reactive to light.  Neck:     Thyroid: No thyroid mass or thyromegaly.     Vascular: No carotid bruit.     Trachea: Trachea normal.  Cardiovascular:     Rate and Rhythm: Normal rate and regular rhythm.     Pulses: Normal pulses.     Heart sounds: Normal heart sounds, S1 normal and S2 normal. No murmur heard.    No friction rub. No gallop.     Comments: Right lower extremity edema greater than left, significant varicosities in lower extremities. Pulmonary:     Effort: Pulmonary effort is normal. No tachypnea or respiratory distress.     Breath sounds: Normal breath sounds. No decreased breath sounds, wheezing, rhonchi or rales.  Abdominal:     General: Bowel sounds are normal.     Palpations: Abdomen is soft.     Tenderness: There is no abdominal tenderness.  Musculoskeletal:     Cervical back: Normal range of motion and neck supple.     Right lower leg: Edema present.     Left lower leg: Edema present.  Skin:    General: Skin is warm and dry.     Findings: No rash.  Neurological:     Mental Status: She is alert.  Psychiatric:        Mood and Affect: Mood is not anxious or depressed.        Speech: Speech normal.        Behavior: Behavior normal. Behavior is cooperative.        Thought Content: Thought content normal.        Judgment: Judgment normal.        Results for orders placed or performed in visit on 06/19/22  Comprehensive metabolic panel  Result Value Ref Range   Glucose, Bld 90 65 - 99 mg/dL   BUN 11 7 - 25 mg/dL   Creat 08/20/22 1.01 - 7.51 mg/dL   BUN/Creatinine Ratio NOT  APPLICABLE 6 - 22 (calc)   Sodium 143 135 - 146 mmol/L   Potassium 4.0 3.5 - 5.3 mmol/L   Chloride 108 98 - 110 mmol/L  CO2 28 20 - 32 mmol/L   Calcium 8.7 8.6 - 10.2 mg/dL   Total Protein 6.2 6.1 - 8.1 g/dL   Albumin 3.8 3.6 - 5.1 g/dL   Globulin 2.4 1.9 - 3.7 g/dL (calc)   AG Ratio 1.6 1.0 - 2.5 (calc)   Total Bilirubin 0.3 0.2 - 1.2 mg/dL   Alkaline phosphatase (APISO) 143 (H) 31 - 125 U/L   AST 11 10 - 35 U/L   ALT 11 6 - 29 U/L  CBC  Result Value Ref Range   WBC 6.5 3.8 - 10.8 Thousand/uL   RBC 4.25 3.80 - 5.10 Million/uL   Hemoglobin 12.8 11.7 - 15.5 g/dL   HCT 87.5 64.3 - 32.9 %   MCV 89.9 80.0 - 100.0 fL   MCH 30.1 27.0 - 33.0 pg   MCHC 33.5 32.0 - 36.0 g/dL   RDW 51.8 84.1 - 66.0 %   Platelets 285 140 - 400 Thousand/uL   MPV 11.1 7.5 - 12.5 fL     COVID 19 screen:  No recent travel or known exposure to COVID19 The patient denies respiratory symptoms of COVID 19 at this time. The importance of social distancing was discussed today.   Assessment and Plan Problem List Items Addressed This Visit     Cellulitis of right toe    Following injury to nailbed it appears there is likely cellulitis at the edge of the nailbed causing pain, swelling and increased warmth.  No abscess palpated  We will treat with Keflex 500 mg p.o. 3 times daily x7 days      Great toe pain, right - Primary    Acute, following compression injury with a can dropped on her toe. We will evaluate for fracture. The impact has likely injured her nailbed and she is likely to lose that toenail.      Relevant Orders   DG Toe Great Right   Mild persistent asthma    Chronic, discussed albuterol use and avoiding overuse. She may need to follow-up with her PCP regarding a controller if she continues to have frequent issues.      Relevant Medications   albuterol (VENTOLIN HFA) 108 (90 Base) MCG/ACT inhaler   Nasal congestion    Acute, likely secondary to resolving symptoms from COVID.  Also  possibly allergies given her history.  No current sign of bacterial infection.  No clear asthma flare but she states her albuterol is out and I have refilled this for her.      Onychomycosis    Chronic condition and right great toenail.  It appears she will likely lose the nail as she has injured the nailbed.  We discussed washing the toe with warm soapy water twice daily and keeping the area covered.      Relevant Medications   cephALEXin (KEFLEX) 500 MG capsule   Other Visit Diagnoses     Seasonal allergic rhinitis, unspecified trigger       Relevant Medications   albuterol (VENTOLIN HFA) 108 (90 Base) MCG/ACT inhaler      Meds ordered this encounter  Medications   albuterol (VENTOLIN HFA) 108 (90 Base) MCG/ACT inhaler    Sig: INHALE 1-2 PUFFS into THE lungs EVERY 6 HOURS AS NEEDED FOR SHORTNESS OF BREATH OR wheezing    Dispense:  18 g    Refill:  0    QS for 90 day supply   cephALEXin (KEFLEX) 500 MG capsule    Sig: Take 1 capsule (500 mg total) by  mouth 3 (three) times daily.    Dispense:  21 capsule    Refill:  0   Orders Placed This Encounter  Procedures   DG Toe Great Right    Standing Status:   Future    Number of Occurrences:   1    Standing Expiration Date:   08/15/2023    Order Specific Question:   Reason for Exam (SYMPTOM  OR DIAGNOSIS REQUIRED)    Answer:   right toe pain    Order Specific Question:   Is patient pregnant?    Answer:   No    Order Specific Question:   Preferred imaging location?    Answer:   Misty Stanley, MD

## 2022-08-14 NOTE — Patient Instructions (Addendum)
Elevated right foot above heart.  Complete a course of antibiotics.  Can use ibuprofen 600 mg three times daily for pain instead of tylenol.  Go back to previous dose  of gabapentin.  We will call with the X-ray .   Start flonase 2 sprays per nostril daily.  Use albuterol  as needed for shortness or breath or wheezing.

## 2022-08-14 NOTE — Assessment & Plan Note (Signed)
Acute, likely secondary to resolving symptoms from COVID.  Also possibly allergies given her history.  No current sign of bacterial infection.  No clear asthma flare but she states her albuterol is out and I have refilled this for her.

## 2022-08-14 NOTE — Assessment & Plan Note (Signed)
Acute, following compression injury with a can dropped on her toe. We will evaluate for fracture. The impact has likely injured her nailbed and she is likely to lose that toenail.

## 2022-08-14 NOTE — Assessment & Plan Note (Signed)
Chronic, discussed albuterol use and avoiding overuse. She may need to follow-up with her PCP regarding a controller if she continues to have frequent issues.

## 2022-08-14 NOTE — Assessment & Plan Note (Signed)
Chronic condition and right great toenail.  It appears she will likely lose the nail as she has injured the nailbed.  We discussed washing the toe with warm soapy water twice daily and keeping the area covered.

## 2022-08-19 ENCOUNTER — Telehealth: Payer: Self-pay | Admitting: Nurse Practitioner

## 2022-08-19 NOTE — Telephone Encounter (Signed)
Patient called in asking for results from her toe xray on 9/1. She would like a phone call.

## 2022-08-19 NOTE — Telephone Encounter (Signed)
Kathy Gamble notified by telephone that her toe x-ray was normal.  No fracture seen.

## 2022-08-20 ENCOUNTER — Encounter: Payer: Self-pay | Admitting: *Deleted

## 2022-08-20 NOTE — Telephone Encounter (Signed)
Does patient need PT  for her foot?

## 2022-08-20 NOTE — Telephone Encounter (Signed)
Left detailed message for Kathy Gamble with below information from Dr. Ermalene Searing. I also sent this response, to this message,  to patient's MyChart as well.

## 2022-08-20 NOTE — Telephone Encounter (Signed)
Call  No physical therapy is needed for her foot.  She needs to continue as we discussed with washing her toe injury with warm soapy water applying topical antibiotic and completing the course of Keflex.  Expectation should be that the toenail will possibly fall off.  If not improving,  she can follow-up next week with her PCP or myself for reevaluation.

## 2022-08-20 NOTE — Telephone Encounter (Signed)
Pt called in requesting a call back stated she wants to discuss if she need's Therapy for her foot . Please advise [phone]

## 2022-08-21 NOTE — Telephone Encounter (Signed)
Called patient reviewed all information and repeated back to me. Will call if any questions.  ? ?

## 2022-08-21 NOTE — Telephone Encounter (Signed)
[facility] Night - Client Nonclinical Telephone Record  [facility] Client [facility] Night - Client Client Site [facility] - Night Provider Audria Nine- NP Contact Type Call Who Is Calling Patient / Member / Family / Caregiver [name] Name Kathy Gamble [name] Phone Number [phone] Call Type Message Only Information Provided Reason for Call Returning a Call from the Office Initial Comment [name] states that they missed a call from the office about her foot. Additional Comment [name] states that she has a topical cream and she doesn't know how much to put on and how many times a day, She is still not able to walk correctly, she also wanted to know about what type of shoe she needs to wear. Disp. Time Disposition Final User 08/20/2022 5:21:52 PM General Information Provided Yes [name] Call Closed By: Rochele Pages Transaction Date/Time: 08/20/2022 5:17:37 PM (ET

## 2022-08-26 ENCOUNTER — Telehealth: Payer: Self-pay

## 2022-08-26 NOTE — Telephone Encounter (Addendum)
Unable to reach pt by phone but per access nurse note pt was going to [facility]. Sending note to Audria Nine NP and Anastasiya CMA.  Pt called back and pt said she cut her thumb on glass jar and it was not last night but 4 nights ago. Pt said it is not bleeding now, no drainage, no redness or swelling or pain. Pt is going to keep area clean and dry.  Pt said she initially told access nurse she was going to ED but pts husband advised her she did not need to go to ED. Offered pt an appt at [facility] today but she asked her husband and he said she did not need to go anywhere. Pt will cb if needed.

## 2022-08-26 NOTE — Telephone Encounter (Signed)
[facility] Night - Client TELEPHONE ADVICE RECORD [facility] Patient Name: Kathy Gamble Gender: Female DOB: July 01, 1976 Age: 46 Y 9 M 14 D Return Phone Number: [phone] (Primary), [phone] (Secondary) Address: City/ State/ Zip: [place] [address] Client [facility] Night - Client Client Site  [facility] - Night Provider Audria Nine- NP Contact Type Call Who Is Calling Patient / Member / Family / Caregiver Call Type Triage / Clinical Relationship To Patient Self Return Phone Number [phone] (Primary) Chief Complaint Cuts and Lacerations Reason for Call Symptomatic / Request for Health Information Initial Comment [name] states they cut themselves on glass. Translation No Nurse Assessment Nurse: Renato Gails, RN, Cristan Date/Time (Eastern Time): 08/26/2022 8:01:08 AM Confirm and document reason for call. If symptomatic, describe symptoms. ---[name] states they cut themselves on glass. States got all glass out. Thumb injured on glass pickle jar last night. No bleeding now. Does the patient have any new or worsening symptoms? ---Yes Will a triage be completed? ---Yes Related visit to physician within the last 2 weeks? ---No Does the PT have any chronic conditions? (i.e. diabetes, asthma, this includes High risk factors for pregnancy, etc.) ---Yes List chronic conditions. ---add Is the patient pregnant or possibly pregnant? (Ask all females between the ages of 69-55) ---No Is this a behavioral health or substance abuse call? ---No Guidelines Guideline Title Affirmed Question Affirmed Notes Nurse Date/Time (Eastern Time) Cuts and Lacerations Wound causes numbness (i.e., loss of sensation) Renato Gails, Charity fundraiser, Cristan 08/26/2022 8:01:17 AM Disp. Time Lamount Cohen Time) Disposition Final User 08/26/2022 8:08:17 AM Go to ED Now (or PCP triage) Yes Renato Gails, RN, Cristan PLEASE NOTE: All timestamps contained within  this report are represented as [place] Standard Time. CONFIDENTIALTY NOTICE: This fax transmission is intended only for the addressee. It contains information that is legally privileged, confidential or otherwise protected from use or disclosure. If you are not the intended recipient, you are strictly prohibited from reviewing, disclosing, copying using or disseminating any of this information or taking any action in reliance on or regarding this information. If you have received this fax in error, please notify us immediately by telephone so that we can arrange for its return to [place]. Phone: [phone], Toll-Free: [phone], Fax: [phone] Page: 2 of 2 Call Id: [id number] Final Disposition 08/26/2022 8:08:17 AM Go to ED Now (or PCP triage) Yes Renato Gails, RN, Cristan [name] Disagree/Comply Comply [name] Understands Yes PreDisposition Did not know what to do Care Advice Given Per Guideline GO TO ED NOW (OR PCP TRIAGE): * IF PCP SECOND-LEVEL TRIAGE REQUIRED: You may need to be seen. Your doctor (or NP/PA) will want to talk with you to decide what's best. I'll page the provider on-call now. If you haven't heard from the provider (or me) within 30 minutes, go directly to the ED/UCC at _____________ Hospital. CLEAN THE WOUND: * Wash the wound with soap and water. * Bleeding: Put direct pressure on the wound for 10 minutes to stop any bleeding. Place a clean cloth or gauze pad over the wound. Press down firmly with your fingers over the bleeding area. COVER THE WOUND: * Cover the wound with a dressing. CARE ADVICE given per Cuts and Lacerations (Adult) guideline. Comments User: Caprice Red, RN Date/Time Lamount Cohen Time): 08/26/2022 8:04:21 AM reports 1/8 inch gash User: Caprice Red, RN Date/Time (Eastern Time): 08/26/2022 8:09:01 AM reports laceration occurred while trying to pick up a pickle jar. Referrals [facility] at [facility]

## 2022-08-26 NOTE — Telephone Encounter (Signed)
noted 

## 2022-09-04 ENCOUNTER — Telehealth: Payer: Self-pay

## 2022-09-04 NOTE — Telephone Encounter (Signed)
Ok,noted

## 2022-09-04 NOTE — Telephone Encounter (Signed)
[place] Night - Client TELEPHONE ADVICE RECORD [facility] Patient Name: Kathy Gamble Gender: Female DOB: Nov 14, 1976 Age: 46 Y 9 M 22 D Return Phone Number: [phone] (Primary) Address: City/ State/ Zip: [place] [address] Client [facility] Night - Client Client Site [place] Provider [name]- NP Contact Type Call Who Is Calling Patient / Member / Family / Caregiver Call Type Triage / Clinical Relationship To Patient Self Return Phone Number [phone] (Primary) Chief Complaint BREATHING - fast, heavy or wheezing Reason for Call Symptomatic / Request for [name], she has been coughing a lot and feels like she cannot breathe very well, wheezing as well. She would like an antibiotic. Translation No Nurse Assessment Nurse: Windle Guard, RN, Lesa Date/Time (Eastern Time): 09/03/2022 5:14:08 PM Confirm and document reason for call. If symptomatic, describe symptoms. ---[name] states she has productive cough, SOB and wheezing Does the patient have any new or worsening symptoms? ---Yes Will a triage be completed? ---Yes Related visit to physician within the last 2 weeks? ---No Does the PT have any chronic conditions? (i.e. diabetes, asthma, this includes High risk factors for pregnancy, etc.) ---Yes List chronic conditions. ---HTN, asthma Is the patient pregnant or possibly pregnant? (Ask all females between the ages of 82-55) ---No Is this a behavioral health or substance abuse call? ---No Guidelines Guideline Title Affirmed Question Affirmed Notes Nurse Date/Time (Eastern Time) Asthma Attack [1] Wheezing or coughing AND [2] hasn't used neb or inhaler (up to 3 treatments given 20 minutes apart) AND [3] it's available Conner, RN, Lesa 09/03/2022 5:16:49 PM PLEASE NOTE: All timestamps contained within this report  are represented as [place] Standard Time. CONFIDENTIALTY NOTICE: This fax transmission is intended only for the addressee. It contains information that is legally privileged, confidential or otherwise protected from use or disclosure. If you are not the intended recipient, you are strictly prohibited from reviewing, disclosing, copying using or disseminating any of this information or taking any action in reliance on or regarding this information. If you have received this fax in error, please notify us immediately by telephone so that we can arrange for its return to [place]. Phone: [phone], Toll-Free: [phone], Fax: [phone] Page: 2 of 2 Call Id: [id number] [place]. Time Eilene Ghazi Time) Disposition Final User 09/03/2022 5:12:30 PM Send to Urgent [place] 09/03/2022 5:27:05 PM Urgent Home Treatment with FollowUp Call Yes Conner, RN, Emmaline Kluver 09/03/2022 5:28:03 PM Send To RN Personal Windle Guard, RN, Lesa Final Disposition 09/03/2022 5:27:05 PM Urgent Home Treatment with Follow-Up Call Yes Conner, RN, Lesa [name] Disagree/Comply Comply [name] Understands Yes PreDisposition Call Doctor Care Advice Given Per Guideline URGENT HOME TREATMENT WITH FOLLOW-UP CALL: * Call-back instructions. * I'll call you back in 30-60 minutes to see how you are doing. * ... Moderate asthma attack (audible wheezes, breathing difficulty) and not better after 2 or 3 inhaler or nebulizer treatments given 20 minutes apart. * During an ASTHMA ATTACK use your SHORT-ACTING QUICK-RELIEF INHALER (4 puffs, such as ALBUTEROL) or nebulizer up to 3 times, every 20 minutes as needed. ASTHMA ATTACK - TREATMENT - QUICK-RELIEF MEDICINE: * Note: The best 'cough medicine' for an adult with asthma is usually your asthma medicine. Don't use cough suppressants, but cough drops may help a tickly cough. * Note: You probably need to see a doctor right away if an asthma attack is not better after 2 or 3 quick-relief treatments (  such as  albuterol by inhaler or nebulizer) 20 minutes apart. CALL BACK IF: * You become worse before the nurse talks with you again on the follow-up call * The nurse hasn't called back within 60 minutes. CARE ADVICE given per Asthma Attack (Adult) guideline. Comments User: Starleen Blue, RN Date/Time Eilene Ghazi Time): 09/03/2022 6:35:34 PM Unable to reach the caller. Her number is no longer in servic

## 2022-09-04 NOTE — Telephone Encounter (Signed)
Unable to reach pt, pts husband and pts father (DPR signed) left v/ms for pt to call [facility]. Sending note to [place] triage T Dugal FNP and Anastasiya CMA.

## 2022-09-04 NOTE — Telephone Encounter (Signed)
Thank you, agree pt is to be seen more urgently if no available appts then urgent care, depending on severity of SOB er may be necesary

## 2022-09-04 NOTE — Telephone Encounter (Signed)
I spoke with pts husband and pt; pt did not go to UC or ED. Pt said I woke her up and when she gets up she plans to use her husband's "breathing machine", pt notified of T Dugal NP instruction and pt voiced understanding but she said she was going to stay home and use Mark's breathing machine; I advised pt she should not use someone else's medication that goes in the machine. Pt said I woke her up.pt said she is not wheezing now. Pt said she is going to take a nap and then she is going to use breathing machine and then if something happens that is on her because she is staying at home. Again UC & ED precautions given and pt response was she was going back to sleep and call ended. Sending note to T Dugal FNP and Anastasiya CMA.

## 2022-09-09 ENCOUNTER — Ambulatory Visit: Payer: Medicare PPO | Admitting: Family Medicine

## 2022-09-11 ENCOUNTER — Telehealth: Payer: Self-pay | Admitting: *Deleted

## 2022-09-11 ENCOUNTER — Ambulatory Visit (INDEPENDENT_AMBULATORY_CARE_PROVIDER_SITE_OTHER): Payer: Medicare PPO | Admitting: Family Medicine

## 2022-09-11 ENCOUNTER — Encounter: Payer: Self-pay | Admitting: Family Medicine

## 2022-09-11 VITALS — BP 128/72 | HR 85 | Temp 98.4°F | Ht 69.0 in | Wt 247.5 lb

## 2022-09-11 DIAGNOSIS — J019 Acute sinusitis, unspecified: Secondary | ICD-10-CM | POA: Insufficient documentation

## 2022-09-11 DIAGNOSIS — J4530 Mild persistent asthma, uncomplicated: Secondary | ICD-10-CM | POA: Diagnosis not present

## 2022-09-11 DIAGNOSIS — J0100 Acute maxillary sinusitis, unspecified: Secondary | ICD-10-CM

## 2022-09-11 MED ORDER — PREDNISONE 10 MG PO TABS: ORAL_TABLET | ORAL | 0 refills | Status: DC

## 2022-09-11 MED ORDER — AMOXICILLIN-POT CLAVULANATE 875-125 MG PO TABS: 1.0000 | ORAL_TABLET | Freq: Two times a day (BID) | ORAL | 0 refills | Status: DC

## 2022-09-11 NOTE — Telephone Encounter (Signed)
The issue was an injury to her left great toenail with associated bacterial superinfection. I agree she should not use triamcinolone cream. It is expected that the nail will fall off given injury to the nailbed, but if there is redness heat pain and discharge she likely has a recurrent infection.  She can apply over-the-counter topical antibiotic and do warm water soaks until she can be seen  over the weekend at urgent care.

## 2022-09-11 NOTE — Progress Notes (Unsigned)
Subjective:    Patient ID: Kathy Gamble, female    DOB: 07-17-1976, 46 y.o.   MRN: [id number]  HPI 46 yo pt of [name] presents with hoarseness and wheezing   Wt Readings from Last 3 Encounters:  09/11/22 247 lb 8 oz (112.3 kg)  08/14/22 248 lb (112.5 kg)  06/19/22 250 lb 4 oz (113.5 kg)   36.55 kg/m  Started 2 weeks ago  Has had a cough  Some sinus headache  Stuffy nose with yellow   Some sore throat   Some yellow phlegm from throat and chest  Wheezing - using inhaler and it does not help at all   No fever   Has not done a covid   She has h/o mild persistent asthma  Takes xyzal and singulair and albuterol mdi   Pulse ox 98% today   Patient Active Problem List   Diagnosis Date Noted   Acute sinusitis 09/11/2022   Nasal congestion 08/14/2022   Mild persistent asthma 08/14/2022   Onychomycosis 08/14/2022   Cellulitis of right toe 08/14/2022   Great toe pain, right 08/14/2022   Acute non-recurrent pansinusitis 06/19/2022   Heat rash 06/19/2022   Blister of right foot 05/18/2022   Abnormal CT of spine 03/31/2022   Epistaxis 03/18/2022   Primary hypertension 03/18/2022   [place] 02/11/2022   TMJ (temporomandibular joint syndrome) 12/19/2021   Disorder of right ear lobe 08/11/2021   Cognitive dysfunction 07/21/2021   Chronic migraine with aura 05/05/2021   Seasonal allergic rhinitis due to pollen 09/20/2018   ETD (eustachian tube dysfunction) 03/30/2018   Gastroesophageal reflux disease without esophagitis 12/28/2014   Past Medical History:  Diagnosis Date   Allergy    Bee sting allergy 03/02/2015   Chicken pox    Frequent headaches    GERD (gastroesophageal reflux disease)    Past Surgical History:  Procedure Laterality Date   ADENOIDECTOMY     TONSILLECTOMY     Social History   Tobacco Use   Smoking status: Never   Smokeless tobacco: Never  Vaping Use   Vaping Use: Never used  Substance Use Topics   Alcohol use: No    Alcohol/week: 0.0  standard drinks of alcohol   Drug use: Not Currently    Types: Cocaine   Family History  Problem Relation Age of Onset   Stroke Mother    Mental retardation Mother    Cancer Maternal Grandmother    Cancer Paternal Grandmother        Breast   Heart disease Paternal Grandfather    Allergies  Allergen Reactions   Bee Venom Itching   Nortriptyline Other (See Comments)    GI distress   Latex Rash    The powder in the gloves causes rash   Current Outpatient Medications on File Prior to Visit  Medication Sig Dispense Refill   albuterol (VENTOLIN HFA) 108 (90 Base) MCG/ACT inhaler INHALE 1-2 PUFFS into THE lungs EVERY 6 HOURS AS NEEDED FOR SHORTNESS OF BREATH OR wheezing 18 g 0   amLODipine (NORVASC) 10 MG tablet Take 1 tablet (10 mg total) by mouth daily. 90 tablet 0   gabapentin (NEURONTIN) 300 MG capsule Take by mouth.     levocetirizine (XYZAL) 5 MG tablet TAKE ONE TABLET BY MOUTH ONCE DAILY AS NEEDED 90 tablet 2   losartan (COZAAR) 25 MG tablet TAKE ONE TABLET BY MOUTH ONCE DAILY 90 tablet 1   montelukast (SINGULAIR) 10 MG tablet TAKE ONE TABLET BY MOUTH ONCE  DAILY FOR sinuses 90 tablet 3   Multiple Vitamin (MULTIVITAMIN) capsule Take 1 capsule by mouth daily.     omeprazole (PRILOSEC) 20 MG capsule TAKE ONE CAPSULE BY MOUTH ONCE DAILY 90 capsule 3   venlafaxine (EFFEXOR) 75 MG tablet Take by mouth.     No current facility-administered medications on file prior to visit.      Review of Systems  Constitutional:  Positive for appetite change and fatigue. Negative for fever.  HENT:  Positive for congestion, postnasal drip, rhinorrhea, sinus pressure, sinus pain, sneezing and sore throat. Negative for ear pain.   Eyes:  Negative for pain and discharge.  Respiratory:  Positive for cough and wheezing. Negative for shortness of breath and stridor.   Cardiovascular:  Negative for chest pain.  Gastrointestinal:  Negative for diarrhea, nausea and vomiting.  Genitourinary:  Negative for  frequency, hematuria and urgency.  Musculoskeletal:  Negative for arthralgias and myalgias.  Skin:  Negative for rash.  Neurological:  Positive for headaches. Negative for dizziness, weakness and light-headedness.  Psychiatric/Behavioral:  Negative for confusion and dysphoric mood.        Objective:   Physical Exam Constitutional:      General: She is not in acute distress.    Appearance: Normal appearance. She is well-developed. She is obese. She is not ill-appearing or diaphoretic.  HENT:     Head: Normocephalic and atraumatic.     Comments: Nares are injected and congested  Clear pnd   Bilateral maxillary sinus tenderness     Right Ear: Tympanic membrane and external ear normal.     Left Ear: Tympanic membrane, ear canal and external ear normal.     Nose: Congestion and rhinorrhea present.     Mouth/Throat:     Mouth: Mucous membranes are moist.     Pharynx: No oropharyngeal exudate or posterior oropharyngeal erythema.     Comments: Clear pnd Eyes:     General:        Right eye: No discharge.        Left eye: No discharge.     Conjunctiva/sclera: Conjunctivae normal.     Pupils: Pupils are equal, round, and reactive to light.  Cardiovascular:     Rate and Rhythm: Normal rate and regular rhythm.  Pulmonary:     Effort: Pulmonary effort is normal. No respiratory distress.     Breath sounds: Normal breath sounds. No stridor. No wheezing, rhonchi or rales.     Comments: Wheeze only on forced expiration  No prolonged exp time Musculoskeletal:     Cervical back: Normal range of motion and neck supple.  Lymphadenopathy:     Cervical: No cervical adenopathy.  Skin:    General: Skin is warm and dry.     Findings: No rash.  Neurological:     Mental Status: She is alert.     Cranial Nerves: No cranial nerve deficit.  Psychiatric:        Mood and Affect: Mood normal.           Assessment & Plan:   Problem List Items Addressed This Visit       Respiratory   Acute  sinusitis - Primary    From viral uri with colored nasal d/c and congestion and facial pain/tenderness Px augmentin  Disc use of flonase and nasal saline inst to stop afrin due to rebound congestion       Relevant Medications   amoxicillin-clavulanate (AUGMENTIN) 875-125 MG tablet   predniSONE (DELTASONE) 10 MG  tablet   Mild persistent asthma    Little to no wheezing on exam today  In context of uri and sinusitis  Pt reports significant wheezing at home Px prednisone taper Update if not starting to improve in a week or if worsening  ER parameters discussed in detail      Relevant Medications   predniSONE (DELTASONE) 10 MG tablet

## 2022-09-11 NOTE — Telephone Encounter (Signed)
Dr. Glori Bickers saw pt for URI/ Bronchitis issues.   Pt called back saying that she forgot to have Dr. Glori Bickers look at her feet and wanted her to check them out. I advised pt that her appt is over and we can't comment on her feet since Dr. Glori Bickers didn't look at feet but I can schedule an appt with her PCP for an eval. Pt said she saw Dr. Diona Browner for this issues already and wants some recommendations instead of coming back for a new appt. Pt said that she finished the abx Dr. Diona Browner prescribed and foot got better but now it's red and swollen and hurts and her toenail looks like it's about to fall off. I advised pt that she may need to be re evaluated for sxs but pt said she has some triamcinolone at home and wants to use that. I advised pt that cream may not clear up what is going on. She told me to ask Dr. Diona Browner about it and have them call her back.   Will route to PCP and Dr. Diona Browner

## 2022-09-11 NOTE — Patient Instructions (Addendum)
Take prednisone as directed for wheezing and congestion  Take the augmentin for infection   Drink fluids and rest when you can   Continue current medicines   If you suddenly get worse let us know  If severe -go to the ER   STOP the afrin  Continue the flonase

## 2022-09-11 NOTE — Telephone Encounter (Signed)
Since I have not seen her for the problem, she would need an office visit. If it is infected I would not recommend the use of the triamcinolone cream. We can also await Dr. Rometta Emery comments since she has seen the patient for this matter

## 2022-09-11 NOTE — Telephone Encounter (Signed)
Left message to call back-left detailed message on the phone

## 2022-09-13 NOTE — Assessment & Plan Note (Signed)
Little to no wheezing on exam today  In context of uri and sinusitis  Pt reports significant wheezing at home Px prednisone taper Update if not starting to improve in a week or if worsening  ER parameters discussed in detail

## 2022-09-13 NOTE — Assessment & Plan Note (Signed)
From viral uri with colored nasal d/c and congestion and facial pain/tenderness Px augmentin  Disc use of flonase and nasal saline inst to stop afrin due to rebound congestion

## 2022-10-09 ENCOUNTER — Encounter: Payer: Self-pay | Admitting: Internal Medicine

## 2022-10-09 ENCOUNTER — Ambulatory Visit (INDEPENDENT_AMBULATORY_CARE_PROVIDER_SITE_OTHER): Payer: Medicare PPO | Admitting: Internal Medicine

## 2022-10-09 VITALS — BP 128/88 | HR 82 | Temp 98.3°F | Ht 69.0 in | Wt 255.2 lb

## 2022-10-09 DIAGNOSIS — H6503 Acute serous otitis media, bilateral: Secondary | ICD-10-CM | POA: Diagnosis not present

## 2022-10-09 MED ORDER — LEVOFLOXACIN 500 MG PO TABS
500.0000 mg | ORAL_TABLET | Freq: Every day | ORAL | 0 refills | Status: AC
Start: 2022-10-09 — End: 2022-10-16

## 2022-10-09 MED ORDER — FLUCONAZOLE 150 MG PO TABS: 150.0000 mg | ORAL_TABLET | Freq: Every day | ORAL | 0 refills | Status: DC

## 2022-10-09 MED ORDER — PREDNISONE 10 MG PO TABS: ORAL_TABLET | ORAL | 0 refills | Status: DC

## 2022-10-09 NOTE — Patient Instructions (Addendum)
I think you may have developed an ear infection so I am prescribing another antibiotic called  levaquin, along with a prednisone taper  ,  If this does not resolve,  you will need to keep the ENT appt I am making a referral for   Continue using the nasal steroid spray fluticasone  I will call in fluconazole in case you get a yeast infection   Please noteL Taking an antibiotic can create an imbalance in the normal population of bacteria that live in the small intestine.  This imbalance can persist for 3 months.   Taking a probiotic ( Align, Floraque or Culturelle), the generic version of one of these over the counter medications, or an alternative form ( Yogurt, or another dietary source) for a minimum of 3 weeks may help prevent a serious antibiotic associated diarrhea  Called clostridium dificile colitis that occurs when the bacteria population is altered .  Taking a probiotic may also prevent vaginitis due to yeast infections and can be continued indefinitely if you feel that it improves your digestion or your elimination (bowels).

## 2022-10-09 NOTE — Progress Notes (Unsigned)
Subjective:  Patient ID: Kathy Gamble, female    DOB: 06/10/1976  Age: 46 y.o. MRN: [id number]  CC: There were no encounter diagnoses.   HPI Kathy Gamble presents for No chief complaint on file.  <S Walla is a 46 yr old female with mild persistent asthma who was treated for acute sinusitis September 29 with a prolonged steroid taper and 7 days of Augmentin  when she presented with a two week history of sinus congestion , wheezing and purulent sinus drainage .  States that the ears feel slightly under pressure and slightly painful.  Slight loss in hearing . Has been ppersistent the entire time,  did not improve with prednisone   History of myringotomy tubes ,  bilaterally as  a child. i No recent swimming this summer.  Has been using flonase for the past month 1 squirt each nostril twice daily,          Outpatient Medications Prior to Visit  Medication Sig Dispense Refill   albuterol (VENTOLIN HFA) 108 (90 Base) MCG/ACT inhaler INHALE 1-2 PUFFS into THE lungs EVERY 6 HOURS AS NEEDED FOR SHORTNESS OF BREATH OR wheezing 18 g 0   amLODipine (NORVASC) 10 MG tablet Take 1 tablet (10 mg total) by mouth daily. 90 tablet 0   amoxicillin-clavulanate (AUGMENTIN) 875-125 MG tablet Take 1 tablet by mouth 2 (two) times daily. 14 tablet 0   gabapentin (NEURONTIN) 300 MG capsule Take by mouth.     levocetirizine (XYZAL) 5 MG tablet TAKE ONE TABLET BY MOUTH ONCE DAILY AS NEEDED 90 tablet 2   losartan (COZAAR) 25 MG tablet TAKE ONE TABLET BY MOUTH ONCE DAILY 90 tablet 1   montelukast (SINGULAIR) 10 MG tablet TAKE ONE TABLET BY MOUTH ONCE DAILY FOR sinuses 90 tablet 3   Multiple Vitamin (MULTIVITAMIN) capsule Take 1 capsule by mouth daily.     omeprazole (PRILOSEC) 20 MG capsule TAKE ONE CAPSULE BY MOUTH ONCE DAILY 90 capsule 3   predniSONE (DELTASONE) 10 MG tablet Take 4 pills once daily in the am by mouth for 3 days, then 3 pills daily for 3 days, then 2 pills daily for 3 days then 1 pill daily for 3  days then stop 30 tablet 0   venlafaxine (EFFEXOR) 75 MG tablet Take by mouth.     No facility-administered medications prior to visit.    Review of Systems;  Patient denies headache, fevers, malaise, unintentional weight loss, skin rash, eye pain, sinus congestion and sinus pain, sore throat, dysphagia,  hemoptysis , cough, dyspnea, wheezing, chest pain, palpitations, orthopnea, edema, abdominal pain, nausea, melena, diarrhea, constipation, flank pain, dysuria, hematuria, urinary  Frequency, nocturia, numbness, tingling, seizures,  Focal weakness, Loss of consciousness,  Tremor, insomnia, depression, anxiety, and suicidal ideation.      Objective:  LMP  (LMP Unknown)   BP Readings from Last 3 Encounters:  09/11/22 128/72  08/14/22 (!) 120/90  06/19/22 122/80    Wt Readings from Last 3 Encounters:  09/11/22 247 lb 8 oz (112.3 kg)  08/14/22 248 lb (112.5 kg)  06/19/22 250 lb 4 oz (113.5 kg)    General appearance: alert, cooperative and appears stated age Ears: normal TM's and external ear canals both ears Throat: lips, mucosa, and tongue normal; teeth and gums normal Neck: no adenopathy, no carotid bruit, supple, symmetrical, trachea midline and thyroid not enlarged, symmetric, no tenderness/mass/nodules Back: symmetric, no curvature. ROM normal. No CVA tenderness. Lungs: clear to auscultation bilaterally Heart: regular rate and  rhythm, S1, S2 normal, no murmur, click, rub or gallop Abdomen: soft, non-tender; bowel sounds normal; no masses,  no organomegaly Pulses: 2+ and symmetric Skin: Skin color, texture, turgor normal. No rashes or lesions Lymph nodes: Cervical, supraclavicular, and axillary nodes normal. Neuro:  awake and interactive with normal mood and affect. Higher cortical functions are normal. Speech is clear without word-finding difficulty or dysarthria. Extraocular movements are intact. Visual fields of both eyes are grossly intact. Sensation to light touch is  grossly intact bilaterally of upper and lower extremities. Motor examination shows 4+/5 symmetric hand grip and upper extremity and 5/5 lower extremity strength. There is no pronation or drift. Gait is non-ataxic   Lab Results  Component Value Date   HGBA1C 4.9 12/03/2021   HGBA1C 4.7 06/30/2017   HGBA1C 5.0 01/02/2015    Lab Results  Component Value Date   CREATININE 0.75 06/19/2022   CREATININE 0.62 03/18/2022   CREATININE 0.69 12/03/2021    Lab Results  Component Value Date   WBC 6.5 06/19/2022   HGB 12.8 06/19/2022   HCT 38.2 06/19/2022   PLT 285 06/19/2022   GLUCOSE 90 06/19/2022   CHOL 141 12/03/2021   TRIG 76.0 12/03/2021   HDL 49.80 12/03/2021   LDLCALC 76 12/03/2021   ALT 11 06/19/2022   AST 11 06/19/2022   NA 143 06/19/2022   K 4.0 06/19/2022   CL 108 06/19/2022   CREATININE 0.75 06/19/2022   BUN 11 06/19/2022   CO2 28 06/19/2022   TSH 1.83 12/03/2021   HGBA1C 4.9 12/03/2021    DG Toe Great Right  Result Date: 08/16/2022 CLINICAL DATA:  Right toe pain. Great toe pain, dropped a can on first toe and kicked a table 1-2 months ago. EXAM: RIGHT GREAT TOE COMPARISON:  None Available. FINDINGS: No acute or healing fracture. Normal alignment. Joint spaces are preserved. No erosion, bony destruction or focal bone abnormality. Unremarkable soft tissues. No soft tissue calcifications. Included adjacent digits are unremarkable. IMPRESSION: Negative radiographs of the right great toe. Electronically Signed   By: Keith Rake M.D.   On: 08/16/2022 13:29    Assessment & Plan:   Problem List Items Addressed This Visit   None   I spent a total of   minutes with this patient in a face to face visit on the date of this encounter reviewing the last office visit with me in       ,  most recent visit with cardiology ,    ,  patient's diet and exercise habits, home blood pressure /blod sugar readings, recent ER visit including labs and imaging studies ,   and post visit  ordering of testing and therapeutics.    Follow-up: No follow-ups on file.   Crecencio Mc, MD

## 2022-10-10 ENCOUNTER — Other Ambulatory Visit: Payer: Self-pay | Admitting: Nurse Practitioner

## 2022-10-11 DIAGNOSIS — H6503 Acute serous otitis media, bilateral: Secondary | ICD-10-CM | POA: Insufficient documentation

## 2022-10-11 NOTE — Assessment & Plan Note (Signed)
prescribing empiric Levaquin and prednisone taper.  Continue Flonase and antihistamine

## 2022-10-15 ENCOUNTER — Other Ambulatory Visit: Payer: Self-pay | Admitting: Nurse Practitioner

## 2022-10-15 DIAGNOSIS — I10 Essential (primary) hypertension: Secondary | ICD-10-CM

## 2022-10-15 NOTE — Telephone Encounter (Signed)
Spoke with [facility] pharmacist about refill request. They received refill on 07/17/22 for 90 tablets with 1 refill. They filled it then, then patient called in October stating she lost her bottle and they filled her medication for 21 tablets from the next refill on file to get her through to the next delivery on 10/23/22. Since they used up the refills on file they need new authorization. Let me know if you have any other questions about this.

## 2022-10-20 ENCOUNTER — Other Ambulatory Visit: Payer: Self-pay | Admitting: Nurse Practitioner

## 2022-10-20 DIAGNOSIS — J302 Other seasonal allergic rhinitis: Secondary | ICD-10-CM

## 2022-10-26 ENCOUNTER — Telehealth: Payer: Self-pay

## 2022-10-26 NOTE — Telephone Encounter (Signed)
Noted  

## 2022-10-26 NOTE — Telephone Encounter (Signed)
Mark(DPR signed) said that for 2 wks pt has had burning upon urination with frequency; pt has been drinking a lot of water but still has symptoms. No abd or back pain, no fever and no blood in urine.pt cannot come today because she had root canal; due to transportation pt can only come on Mondays. Matt Oked same day appt on 11/02/22 at 12 noon; [name] understood pt should keep appt and not be late. UC & ED precautions given and [name] voiced understanding. Sending note to Audria Nine NP as Lorain Childes.

## 2022-10-31 ENCOUNTER — Other Ambulatory Visit: Payer: Self-pay | Admitting: Nurse Practitioner

## 2022-11-02 ENCOUNTER — Ambulatory Visit (INDEPENDENT_AMBULATORY_CARE_PROVIDER_SITE_OTHER): Payer: Medicare PPO | Admitting: Nurse Practitioner

## 2022-11-02 ENCOUNTER — Telehealth: Payer: Self-pay | Admitting: Nurse Practitioner

## 2022-11-02 ENCOUNTER — Encounter: Payer: Self-pay | Admitting: Nurse Practitioner

## 2022-11-02 VITALS — BP 148/106 | HR 70 | Temp 98.3°F | Resp 14 | Ht 69.0 in | Wt 265.5 lb

## 2022-11-02 DIAGNOSIS — R300 Dysuria: Secondary | ICD-10-CM

## 2022-11-02 DIAGNOSIS — H6983 Other specified disorders of Eustachian tube, bilateral: Secondary | ICD-10-CM | POA: Diagnosis not present

## 2022-11-02 DIAGNOSIS — J302 Other seasonal allergic rhinitis: Secondary | ICD-10-CM | POA: Diagnosis not present

## 2022-11-02 DIAGNOSIS — H90A12 Conductive hearing loss, unilateral, left ear with restricted hearing on the contralateral side: Secondary | ICD-10-CM | POA: Diagnosis not present

## 2022-11-02 LAB — POCT URINALYSIS DIP (CLINITEK)
Bilirubin, UA: NEGATIVE
Blood, UA: NEGATIVE
Glucose, UA: NEGATIVE mg/dL
Ketones, POC UA: NEGATIVE mg/dL
Leukocytes, UA: NEGATIVE
Nitrite, UA: NEGATIVE
POC PROTEIN,UA: NEGATIVE
Spec Grav, UA: 1.015 (ref 1.010–1.025)
Urobilinogen, UA: 0.2 E.U./dL
pH, UA: 6 (ref 5.0–8.0)

## 2022-11-02 NOTE — Patient Instructions (Signed)
Nice to see you today I want to see for you physical on 12/4. If you need to change the time stop at the desk and see what they can do for you You urine sample was clean/normal today.

## 2022-11-02 NOTE — Assessment & Plan Note (Signed)
UA in office normal.  We will send off for culture.  Patient states she has not had any symptoms for the past couple days.  Encourage hydration and follow-up if symptoms return or persist.  Pending urinary culture

## 2022-11-02 NOTE — Telephone Encounter (Signed)
This was not discussed during today's visit. We can discuss at upcoming visit on 11/16/22 if patient would like or can come in sooner. I left detailed message on patient's voicemail

## 2022-11-02 NOTE — Progress Notes (Signed)
Acute Office Visit  Subjective:     Patient ID: Kathy Gamble, female    DOB: Feb 29, 1976, 46 y.o.   MRN: [id number]  Chief Complaint  Patient presents with   burning with urination    Sx x 2 weeks. Urgency and frequency. No fever.     HPI Patient is in today for urinary complaints  States that she has been having burning for about 2 weeks now. States that it is sometimes. States that she is drinking more and cranberry juice. States that she took a shower and it did not help. No vaginal discharge. Dysuria stopped about a week ago.   Review of Systems  Constitutional:  Negative for chills and fever.  Gastrointestinal:  Negative for abdominal pain, nausea and vomiting.  Musculoskeletal:  Positive for back pain (soreness. heating pad helps).        Objective:    BP (!) 148/106   Pulse 70   Temp 98.3 F (36.8 C)   Resp 14   Ht 5\' 9"  (1.753 m)   Wt 265 lb 8 oz (120.4 kg)   LMP  (LMP Unknown)   SpO2 97%   BMI 39.21 kg/m  BP Readings from Last 3 Encounters:  11/02/22 (!) 148/106  10/09/22 128/88  09/11/22 128/72   Wt Readings from Last 3 Encounters:  11/02/22 265 lb 8 oz (120.4 kg)  10/09/22 255 lb 3.2 oz (115.8 kg)  09/11/22 247 lb 8 oz (112.3 kg)      Physical Exam Vitals and nursing note reviewed.  Constitutional:      Appearance: Normal appearance. She is obese.  Cardiovascular:     Rate and Rhythm: Normal rate and regular rhythm.     Heart sounds: Normal heart sounds.  Pulmonary:     Breath sounds: Normal breath sounds.  Abdominal:     General: Bowel sounds are normal. There is no distension.     Palpations: There is no mass.     Tenderness: There is no abdominal tenderness. There is no right CVA tenderness or left CVA tenderness.     Hernia: No hernia is present.  Neurological:     Mental Status: She is alert.     Results for orders placed or performed in visit on 11/02/22  POCT URINALYSIS DIP (CLINITEK)  Result Value Ref Range   Color, UA  yellow yellow   Clarity, UA clear clear   Glucose, UA negative negative mg/dL   Bilirubin, UA negative negative   Ketones, POC UA negative negative mg/dL   Spec Grav, UA 11/04/22 1.517 - 1.025   Blood, UA negative negative   pH, UA 6.0 5.0 - 8.0   POC PROTEIN,UA negative negative, trace   Urobilinogen, UA 0.2 0.2 or 1.0 E.U./dL   Nitrite, UA Negative Negative   Leukocytes, UA Negative Negative        Assessment & Plan:   Problem List Items Addressed This Visit       Other   Burning with urination - Primary    UA in office normal.  We will send off for culture.  Patient states she has not had any symptoms for the past couple days.  Encourage hydration and follow-up if symptoms return or persist.  Pending urinary culture      Relevant Orders   POCT URINALYSIS DIP (CLINITEK) (Completed)   Urine Culture    No orders of the defined types were placed in this encounter.   Return for as scheduled for you CPE.  [name], NP

## 2022-11-02 NOTE — Telephone Encounter (Signed)
Pt called in wants to know if she need to be on hormones. Please advise [phone]

## 2022-11-03 LAB — URINE CULTURE
MICRO NUMBER:: 14212572
Result:: NO GROWTH
SPECIMEN QUALITY:: ADEQUATE

## 2022-11-09 ENCOUNTER — Telehealth: Payer: Self-pay | Admitting: Nurse Practitioner

## 2022-11-09 NOTE — Telephone Encounter (Signed)
Pt called returning Anastasiya's missed call. Told pt [facility]'s response to lab results. Pt has no questions or concerns. Call back # [phone]

## 2022-11-09 NOTE — Telephone Encounter (Signed)
Noted! Thank you

## 2022-11-16 ENCOUNTER — Encounter: Payer: Self-pay | Admitting: Nurse Practitioner

## 2022-11-16 ENCOUNTER — Ambulatory Visit (INDEPENDENT_AMBULATORY_CARE_PROVIDER_SITE_OTHER): Payer: Medicare PPO | Admitting: Nurse Practitioner

## 2022-11-16 VITALS — BP 130/86 | HR 78 | Temp 97.7°F | Ht 69.0 in | Wt 269.0 lb

## 2022-11-16 DIAGNOSIS — E669 Obesity, unspecified: Secondary | ICD-10-CM | POA: Diagnosis not present

## 2022-11-16 DIAGNOSIS — Z23 Encounter for immunization: Secondary | ICD-10-CM

## 2022-11-16 DIAGNOSIS — Z1211 Encounter for screening for malignant neoplasm of colon: Secondary | ICD-10-CM

## 2022-11-16 DIAGNOSIS — G43E09 Chronic migraine with aura, not intractable, without status migrainosus: Secondary | ICD-10-CM | POA: Diagnosis not present

## 2022-11-16 DIAGNOSIS — K219 Gastro-esophageal reflux disease without esophagitis: Secondary | ICD-10-CM | POA: Diagnosis not present

## 2022-11-16 DIAGNOSIS — J4530 Mild persistent asthma, uncomplicated: Secondary | ICD-10-CM | POA: Diagnosis not present

## 2022-11-16 DIAGNOSIS — J301 Allergic rhinitis due to pollen: Secondary | ICD-10-CM | POA: Diagnosis not present

## 2022-11-16 DIAGNOSIS — I10 Essential (primary) hypertension: Secondary | ICD-10-CM

## 2022-11-16 DIAGNOSIS — Z0000 Encounter for general adult medical examination without abnormal findings: Secondary | ICD-10-CM | POA: Diagnosis not present

## 2022-11-16 LAB — CBC
HCT: 39.9 % (ref 36.0–46.0)
Hemoglobin: 13.4 g/dL (ref 12.0–15.0)
MCHC: 33.7 g/dL (ref 30.0–36.0)
MCV: 88.7 fl (ref 78.0–100.0)
Platelets: 351 10*3/uL (ref 150.0–400.0)
RBC: 4.5 Mil/uL (ref 3.87–5.11)
RDW: 14.8 % (ref 11.5–15.5)
WBC: 10.2 10*3/uL (ref 4.0–10.5)

## 2022-11-16 LAB — COMPREHENSIVE METABOLIC PANEL
ALT: 12 U/L (ref 0–35)
AST: 9 U/L (ref 0–37)
Albumin: 3.7 g/dL (ref 3.5–5.2)
Alkaline Phosphatase: 116 U/L (ref 39–117)
BUN: 20 mg/dL (ref 6–23)
CO2: 31 mEq/L (ref 19–32)
Calcium: 8.5 mg/dL (ref 8.4–10.5)
Chloride: 104 mEq/L (ref 96–112)
Creatinine, Ser: 0.68 mg/dL (ref 0.40–1.20)
GFR: 104.75 mL/min (ref >60.00)
Glucose, Bld: 93 mg/dL (ref 70–99)
Potassium: 4 mEq/L (ref 3.5–5.1)
Sodium: 140 mEq/L (ref 135–145)
Total Bilirubin: 0.3 mg/dL (ref 0.2–1.2)
Total Protein: 6.2 g/dL (ref 6.0–8.3)

## 2022-11-16 LAB — HEMOGLOBIN A1C: Hgb A1c MFr Bld: 5.4 % (ref 4.6–6.5)

## 2022-11-16 LAB — LIPID PANEL
Cholesterol: 155 mg/dL (ref 0–200)
HDL: 59.7 mg/dL (ref >39.00)
LDL Cholesterol: 72 mg/dL (ref 0–99)
NonHDL: 95.58
Total CHOL/HDL Ratio: 3
Triglycerides: 120 mg/dL (ref 0.0–149.0)
VLDL: 24 mg/dL (ref 0.0–40.0)

## 2022-11-16 LAB — TSH: TSH: 1.55 u[IU]/mL (ref 0.35–5.50)

## 2022-11-16 NOTE — Assessment & Plan Note (Signed)
Encouraged healthy lifestyle modifications in regards of exercise 30 minutes a day 5 times a week.  Did offer in office information in regards to Mediterranean diet.

## 2022-11-16 NOTE — Assessment & Plan Note (Signed)
Currently maintained on losartan and amlodipine.  Blood pressure within normal limits upon recheck.  Take blood pressure medicine regimen when she gets home.

## 2022-11-16 NOTE — Assessment & Plan Note (Signed)
Patient currently maintained on omeprazole 20 mg.  States she stopped and was wondering if she could just take Tums.  Seems to be every day that she has heartburn we will continue omeprazole 20 mg

## 2022-11-16 NOTE — Assessment & Plan Note (Signed)
Currently followed by Dr. Theora Master neurology.  Continue taking Aimovig and gabapentin as prescribed follow-up with Dr. Malvin Johns as recommended

## 2022-11-16 NOTE — Patient Instructions (Addendum)
Nice to see you today I will be in touch with the labs once I have them  Try and exercise 5 times a week at 30 mins a day   Reach out to [facility] to see about the allergist  [phone]

## 2022-11-16 NOTE — Assessment & Plan Note (Signed)
Discussed age-appropriate immunizations and screening exams.  Preventative healthcare maintenance and anticipatory guidance information printed out at dismissal of clinic for her age range.  Update flu shot today.  Pap smear, mammogram up-to-date.  Ambulatory referral for colonoscopy today.

## 2022-11-16 NOTE — Assessment & Plan Note (Signed)
Continue taking second-generation antihistamine, Singulair, albuterol inhaler as needed.  Stable today

## 2022-11-16 NOTE — Progress Notes (Signed)
Established Patient Office Visit  Subjective   Patient ID: Kathy Gamble, female    DOB: Jan 04, 1976  Age: 46 y.o. MRN: [id number]  Chief Complaint  Patient presents with   Annual Exam    fasting    HPI  for complete physical and follow up of chronic conditions.  HTN: currently maintained on amlodipine and losartan. Does not check her blood pressure at home has not taken any medication thus far today.  Asthma: Albuterol inhaler an singular. Once a day per patient report sometimes.  Also on antihistamine  GERD: Omeprazole. No EGD on file.  States she stopped taking it because she thought it was causing heartburn instructed patient to continue omeprazole.  Migraines: Followed by Dr. Glenna Durand. Currenlty on gabapentin and Aimovig.  Patient states she stopped doing Aimovig and has yet to let Dr. Malvin Johns know.  Immunizations: -Tetanus: 2016  -Influenza: Update today -Shingles: too young -Pneumonia: too young   -HPV: Aged out  Diet: Fair diet. 3 times a day with some snacks. Water and coffee (cream and sugar)  Exercise: No regular exercise. States that she is walking daily to the mailbox and back that takes 20 mins   Eye exam: Completes annually. Has glasses.  Within a year was her last visit Dental exam: Completes semi-annually   Pap Smear: Completed in  11/2021 Mammogram: Completed in 05/25/2022  Colonoscopy: Needs updating [place] [facility] Cancer Screening: NA Dexa: NA  Sleep: States that she goes to bed aroun 9-930 and gets up around 7 and feels rested most of the time and does snore       Review of Systems  Constitutional:  Negative for chills and fever.  Respiratory:  Negative for shortness of breath.   Cardiovascular:  Negative for chest pain.  Gastrointestinal:  Positive for blood in stool. Negative for abdominal pain, constipation, nausea and vomiting.       Bm daily   Genitourinary:  Positive for urgency. Negative for dysuria.  Neurological:  Positive for  headaches.  Psychiatric/Behavioral:  Negative for hallucinations and suicidal ideas.       Objective:     BP 130/86   Pulse 78   Temp 97.7 F (36.5 C) (Temporal)   Ht 5\' 9"  (1.753 m)   Wt 269 lb (122 kg)   LMP  (LMP Unknown)   SpO2 99%   BMI 39.72 kg/m  BP Readings from Last 3 Encounters:  11/16/22 130/86  11/02/22 (!) 148/106  10/09/22 128/88   Wt Readings from Last 3 Encounters:  11/16/22 269 lb (122 kg)  11/02/22 265 lb 8 oz (120.4 kg)  10/09/22 255 lb 3.2 oz (115.8 kg)      Physical Exam Vitals and nursing note reviewed.  Constitutional:      Appearance: Normal appearance. She is obese.  HENT:     Right Ear: Ear canal and external ear normal.     Left Ear: Ear canal and external ear normal.     Mouth/Throat:     Mouth: Mucous membranes are moist.     Pharynx: Oropharynx is clear.  Eyes:     Extraocular Movements: Extraocular movements intact.     Pupils: Pupils are equal, round, and reactive to light.  Cardiovascular:     Rate and Rhythm: Normal rate and regular rhythm.     Pulses: Normal pulses.     Heart sounds: Normal heart sounds.  Pulmonary:     Effort: Pulmonary effort is normal.     Breath sounds:  Normal breath sounds.  Abdominal:     General: Bowel sounds are normal. There is no distension.     Palpations: There is no mass.     Tenderness: There is no abdominal tenderness.     Hernia: No hernia is present.  Musculoskeletal:     Right lower leg: No edema.     Left lower leg: No edema.  Lymphadenopathy:     Cervical: No cervical adenopathy.  Skin:    General: Skin is warm.  Neurological:     General: No focal deficit present.     Mental Status: She is alert.     Deep Tendon Reflexes:     Reflex Scores:      Bicep reflexes are 2+ on the right side and 2+ on the left side.      Patellar reflexes are 2+ on the right side and 2+ on the left side.    Comments: Bilateral upper and lower extremity strength 5/5  Psychiatric:        Mood and  Affect: Mood normal.        Behavior: Behavior normal.        Thought Content: Thought content normal.        Judgment: Judgment normal.      No results found for any visits on 11/16/22.    The 10-year ASCVD risk score (Arnett DK, et al., 2019) is: 0.8%    Assessment & Plan:   Problem List Items Addressed This Visit       Cardiovascular and Mediastinum   Chronic migraine with aura    Currently followed by Dr. Theora Master neurology.  Continue taking Aimovig and gabapentin as prescribed follow-up with Dr. Malvin Johns as recommended      Primary hypertension    Currently maintained on losartan and amlodipine.  Blood pressure within normal limits upon recheck.  Take blood pressure medicine regimen when she gets home.      Relevant Orders   CBC   Comprehensive metabolic panel   TSH     Respiratory   Seasonal allergic rhinitis due to pollen   Mild persistent asthma    Continue taking second-generation antihistamine, Singulair, albuterol inhaler as needed.  Stable today        Digestive   Gastroesophageal reflux disease without esophagitis    Patient currently maintained on omeprazole 20 mg.  States she stopped and was wondering if she could just take Tums.  Seems to be every day that she has heartburn we will continue omeprazole 20 mg        Other   Obesity (BMI 30-39.9)    Encouraged healthy lifestyle modifications in regards of exercise 30 minutes a day 5 times a week.  Did offer in office information in regards to Mediterranean diet.      Relevant Orders   Hemoglobin A1c   Lipid panel   Preventative health care - Primary    Discussed age-appropriate immunizations and screening exams.  Preventative healthcare maintenance and anticipatory guidance information printed out at dismissal of clinic for her age range.  Update flu shot today.  Pap smear, mammogram up-to-date.  Ambulatory referral for colonoscopy today.      Relevant Orders   CBC   Comprehensive  metabolic panel   Hemoglobin A1c   TSH   Lipid panel   Other Visit Diagnoses     Need for immunization against influenza       Relevant Orders   Flu Vaccine QUAD 55mo+IM (Fluarix, Fluzone &  Alfiuria Quad PF) (Completed)   Screening for colon cancer       Relevant Orders   Ambulatory referral to Gastroenterology       Return in about 1 year (around 11/17/2023) for CPE and Labs.    Audria Nine, NP

## 2022-11-19 ENCOUNTER — Telehealth: Payer: Self-pay | Admitting: Nurse Practitioner

## 2022-11-19 NOTE — Telephone Encounter (Signed)
Per patient report she was seen by ENT and they mentioned allergy referral per patient report. I informed patient that she should call the ENT office to check on the referral as I did not see one in the chart.

## 2022-11-19 NOTE — Telephone Encounter (Signed)
Lvm asking pt to call back. Needs OV to be evaluated for blister on her finger.   I do not see referral from [facility] to allergist. Will fwd message to [facility] to address.

## 2022-11-19 NOTE — Telephone Encounter (Signed)
Pt called requesting to speak to [facility] or a nurse regarding a blister she has on her finger? Pt asked for a call back. Pt also asked for lab results from last week, I told pt [facility]'s response. Pt had no questions or concerns regarding results. Pt mentioned a referral was suppose to be placed to a allergist from [place], pt is asking for status of the referral? Call back # [phone]

## 2022-11-20 NOTE — Telephone Encounter (Signed)
Patient called back in, informed of the number she needs to call to set up an appointment with ENT. She will get that set up.

## 2022-11-20 NOTE — Telephone Encounter (Signed)
Tried to call patient and get update if she was able to get an appointment with ENT.  Per letter in patients chart regarding referral it has been approved and she needs to contact the number below to schedule appointment.    If you do not already have an appointment, please call [phone] to make an appointment.

## 2022-11-30 ENCOUNTER — Telehealth: Payer: Self-pay | Admitting: Nurse Practitioner

## 2022-11-30 DIAGNOSIS — J302 Other seasonal allergic rhinitis: Secondary | ICD-10-CM | POA: Diagnosis not present

## 2022-11-30 DIAGNOSIS — H6983 Other specified disorders of Eustachian tube, bilateral: Secondary | ICD-10-CM | POA: Diagnosis not present

## 2022-11-30 NOTE — Telephone Encounter (Signed)
Patient called and stated she doesn't have her other blood pressure medication and she didn't know the name of it. Call back number [phone].

## 2022-11-30 NOTE — Telephone Encounter (Signed)
Left message on voicemail for patient to call the office back.  See phone note 11/30/22 from [employer]. . Patient is suppose to be on Amlodipine and Losartan. [facility] aware.

## 2022-11-30 NOTE — Telephone Encounter (Signed)
Pharmacy called stating that patient called in to pharmacy confused as to if she is still supposed to be takingamLODipine (NORVASC) 10 MG tablet  ? She's still takinglosartan (COZAAR) 25 MG tablet . Pharmacy would like to know if she's still suppose to be taking this medication? Please advise.

## 2022-11-30 NOTE — Telephone Encounter (Signed)
Patient called and asked when would the prescription be ready for her to pick up.

## 2022-11-30 NOTE — Telephone Encounter (Signed)
Olegario Messier at [facility] as instructed by telephone and verbalized understanding.

## 2022-11-30 NOTE — Telephone Encounter (Signed)
Patient is suppose to be taking both the amlodipine and the losartan

## 2022-11-30 NOTE — Telephone Encounter (Signed)
Spoke to patient by telephone and advised her that she is to be on two blood pressure medications. Reviewed the names with patient. Advised patient that she will need to reach out to [facility] to find out when they will be sent to her. Patient stated that she has the telephone number for [facility] and will call them.

## 2022-12-15 ENCOUNTER — Telehealth: Payer: Self-pay | Admitting: Nurse Practitioner

## 2022-12-15 NOTE — Telephone Encounter (Signed)
Patient would like to know if it would be okay to takeomeprazole (PRILOSEC) 20 MG capsule  in the morning with all of her other pills instead of at night due to her having digestive issues? She oculo like to know if Catalina Antigua thinks she should be on a medication to help her digest her food,due to her having trouble chewing her food since she has had her teeth pulled?

## 2022-12-16 NOTE — Telephone Encounter (Signed)
Pt called back and I gave her the information.

## 2022-12-16 NOTE — Telephone Encounter (Signed)
Left message to return call to our office.  

## 2022-12-16 NOTE — Telephone Encounter (Signed)
Patient can take the omeprazole in the morning but I would like it separate of her other meds. There is not medication to help with chewing food due to having dental work. She can check with the dentist about healing time and if she is going to get dentures or next steps

## 2022-12-16 NOTE — Telephone Encounter (Signed)
Pt called back requesting a call back regarding having trouble chewing her  her food and what medications she can take . Please advise [phone]

## 2022-12-25 DIAGNOSIS — J301 Allergic rhinitis due to pollen: Secondary | ICD-10-CM | POA: Diagnosis not present

## 2023-01-04 ENCOUNTER — Other Ambulatory Visit: Payer: Self-pay | Admitting: Nurse Practitioner

## 2023-01-06 ENCOUNTER — Telehealth: Payer: Self-pay

## 2023-01-06 NOTE — Telephone Encounter (Signed)
Patient called in to report congestion/mucus in her throat along with diarrhea and nausea. Patient advised she would need OV to evaluate. Patient states she does not have a ride at this time. Advised Kathy Gamble has open appointments every day this week, at this time, and she can always schedule with another provider if necessary. Also recommended UC if patient has increase in symptoms. Patient voiced understanding and will call back to schedule OV.

## 2023-01-07 ENCOUNTER — Other Ambulatory Visit: Payer: Self-pay | Admitting: Nurse Practitioner

## 2023-01-08 ENCOUNTER — Other Ambulatory Visit: Payer: Self-pay | Admitting: Nurse Practitioner

## 2023-01-08 DIAGNOSIS — I10 Essential (primary) hypertension: Secondary | ICD-10-CM

## 2023-01-11 DIAGNOSIS — M26643 Arthritis of bilateral temporomandibular joint: Secondary | ICD-10-CM | POA: Diagnosis not present

## 2023-01-11 DIAGNOSIS — H7403 Tympanosclerosis, bilateral: Secondary | ICD-10-CM | POA: Diagnosis not present

## 2023-01-11 DIAGNOSIS — J301 Allergic rhinitis due to pollen: Secondary | ICD-10-CM | POA: Diagnosis not present

## 2023-01-11 DIAGNOSIS — H6983 Other specified disorders of Eustachian tube, bilateral: Secondary | ICD-10-CM | POA: Diagnosis not present

## 2023-01-13 DIAGNOSIS — R519 Headache, unspecified: Secondary | ICD-10-CM | POA: Diagnosis not present

## 2023-01-15 ENCOUNTER — Telehealth: Payer: Self-pay

## 2023-01-15 NOTE — Telephone Encounter (Signed)
Pt called and said Dr at [facility] just told pt that she has a pinched nerve and Dr Melrose Nakayama wanted to know if pt needs a pain dr per pt. Pt cannot find effexor that Dr Melrose Nakayama gave her to help her sleep and pt thinks she has more effexor being delivered from pharmacy today at 3 PM.pt found one peach colored pill on floor but pt did not think she should take it. I asked what our office could do for pt and she said she wants to know if [name] NP thinks pt needs a pain mgt doctor.if so can Matt help pt get appt with pain mgt. Pt has pinch nerve in neck with pain level now of 7-8. Pt has pain in neck and back and has ear pain and cannot get tubes in ears and pt has migraine h/a 24/7. Pt request cb after [name] NP reviews the note. Sending note to [name] NP and [place] [facility]. [facility].

## 2023-01-15 NOTE — Telephone Encounter (Signed)
Dr Weber Cooks office has closed for the day. Santiago Glad at switchboard took note to send to Dr Melrose Nakayama CMA; [place].to verify with Dr Melrose Nakayama that pt was to call [facility] and ask if [name] NP thought pt needed a pain mgt doctor. Per pt she was just notified by Dr Melrose Nakayama that she has a pinched nerve in neck. Santiago Glad said that Loma Sousa would return call next wk. Sending note to [name] NP.

## 2023-01-15 NOTE — Telephone Encounter (Signed)
Noted. Will await response from Dr. Weber Cooks office

## 2023-01-19 ENCOUNTER — Telehealth: Payer: Self-pay

## 2023-01-19 ENCOUNTER — Telehealth (INDEPENDENT_AMBULATORY_CARE_PROVIDER_SITE_OTHER): Payer: Medicare PPO | Admitting: Internal Medicine

## 2023-01-19 ENCOUNTER — Encounter: Payer: Self-pay | Admitting: Internal Medicine

## 2023-01-19 DIAGNOSIS — J0110 Acute frontal sinusitis, unspecified: Secondary | ICD-10-CM

## 2023-01-19 MED ORDER — AMOXICILLIN-POT CLAVULANATE 875-125 MG PO TABS: 1.0000 | ORAL_TABLET | Freq: Two times a day (BID) | ORAL | 0 refills | Status: DC

## 2023-01-19 NOTE — Progress Notes (Signed)
Subjective:    Patient ID: Kathy Gamble, female    DOB: 1976/01/27, 47 y.o.   MRN: [id number]  HPI Video virtual visit due to persistent cough Identification done Reviewed limitations and billing and she gave consent Participants--patient in her home and I am in my office  Has been coughing and bringing up mucus Not really having drainage Goes back 1 week or more No fever Some frontal headache Some SOB---using inhaler with some relief Some ear pain and ringing--had serous otitis a few months ago (never got tubes that were planned)  Using robitussin and day/night cold meds  Current Outpatient Medications on File Prior to Visit  Medication Sig Dispense Refill   AIMOVIG 140 MG/ML SOAJ      albuterol (VENTOLIN HFA) 108 (90 Base) MCG/ACT inhaler INHALE 1-2 PUFFS into THE lungs EVERY 6 HOURS AS NEEDED FOR SHORTNESS OF BREATH OR wheezing 18 g 0   amLODipine (NORVASC) 10 MG tablet Take 1 tablet (10 mg total) by mouth daily. 90 tablet 0   gabapentin (NEURONTIN) 400 MG capsule Take 400 mg by mouth at bedtime.     levocetirizine (XYZAL) 5 MG tablet TAKE ONE TABLET BY MOUTH ONCE daily AS NEEDED 90 tablet 2   losartan (COZAAR) 25 MG tablet TAKE ONE TABLET BY MOUTH ONCE DAILY 90 tablet 1   montelukast (SINGULAIR) 10 MG tablet TAKE ONE TABLET BY MOUTH ONCE DAILY FOR sinuses 90 tablet 3   Multiple Vitamin (MULTIVITAMIN) capsule Take 1 capsule by mouth daily.     omeprazole (PRILOSEC) 20 MG capsule TAKE ONE CAPSULE BY MOUTH ONCE DAILY 30 capsule 0   No current facility-administered medications on file prior to visit.    Allergies  Allergen Reactions   Bee Venom Itching   Nortriptyline Other (See Comments)    GI distress   Latex Rash    The powder in the gloves causes rash    Past Medical History:  Diagnosis Date   Allergy    Bee sting allergy 03/02/2015   Chicken pox    Frequent headaches    GERD (gastroesophageal reflux disease)     Past Surgical History:  Procedure Laterality  Date   ADENOIDECTOMY     TONSILLECTOMY      Family History  Problem Relation Age of Onset   Stroke Mother    Mental retardation Mother    Cancer Maternal Grandmother    Cancer Paternal Grandmother        Breast   Heart disease Paternal Grandfather     Social History   Socioeconomic History   Marital status: Married    Spouse name: Not on file   Number of children: Not on file   Years of education: Not on file   Highest education level: Not on file  Occupational History   Occupation: disabled  Tobacco Use   Smoking status: Never   Smokeless tobacco: Never  Vaping Use   Vaping Use: Never used  Substance and Sexual Activity   Alcohol use: No    Alcohol/week: 0.0 standard drinks of alcohol   Drug use: Not Currently    Types: Cocaine   Sexual activity: Yes    Birth control/protection: None  Other Topics Concern   Not on file  Social History Narrative   Cognitive dysfunction - lives with her husband   Social Determinants of Health   Financial Resource Strain: Low Risk  (04/17/2022)   Overall Financial Resource Strain (CARDIA)    Difficulty of Paying Living Expenses: Not  hard at all  Food Insecurity: No Food Insecurity (04/17/2022)   Hunger Vital Sign    Worried About Running Out of Food in the Last Year: Never true    Ran Out of Food in the Last Year: Never true  Transportation Needs: No Transportation Needs (04/17/2022)   PRAPARE - Hydrologist (Medical): No    Lack of Transportation (Non-Medical): No  Physical Activity: Insufficiently Active (04/17/2022)   Exercise Vital Sign    Days of Exercise per Week: 7 days    Minutes of Exercise per Session: 20 min  Stress: Stress Concern Present (04/17/2022)   [place]    Feeling of Stress : To some extent  Social Connections: Socially Integrated (04/17/2022)   Social Connection and Isolation Panel [NHANES]    Frequency of  Communication with Friends and Family: More than three times a week    Frequency of Social Gatherings with Friends and Family: More than three times a week    Attends Religious Services: 1 to 4 times per year    Active Member of [employer] or Organizations: Yes    Attends Archivist Meetings: 1 to 4 times per year    Marital Status: Married  Human resources officer Violence: Not At Risk (04/17/2022)   Humiliation, Afraid, Rape, and Kick questionnaire    Fear of Current or Ex-Partner: No    Emotionally Abused: No    Physically Abused: No    Sexually Abused: No    Review of Systems Some back pain--has known pinched nerve No nausea---vomited once One loose stool Eating okay No change in smell or taste Hasn't tested for COVID    Objective:   Physical Exam Constitutional:      Appearance: Normal appearance.     Comments: Voice is nasal Obvious trouble hearing (?still with serous OM?)  Pulmonary:     Effort: Pulmonary effort is normal. No respiratory distress.  Neurological:     Mental Status: She is alert.            Assessment & Plan:

## 2023-01-19 NOTE — Telephone Encounter (Signed)
[place] Night - Client TELEPHONE ADVICE RECORD [facility] Patient Name: Kathy Gamble Gender: Female DOB: 1976/03/24 Age: 47 Y 2 M 6 D Return Phone Number: [phone] (Primary) Address: City/ State/ Zip: [place] [address] Client [place] Night - Client Client Site [place] Provider [name]- NP Contact Type Call Who Is Calling Patient / Member / Family / Caregiver Call Type Triage / Clinical Relationship To Patient Self Return Phone Number [phone] (Primary) Chief Complaint Cough Reason for Call Symptomatic / Request for [place] states she was to get video call and never receive it. She is having a cough and runny nose. Translation No Nurse Assessment Nurse: Sharen Counter, RN, Ashby Dawes Date/Time (Eastern Time): 01/18/2023 5:08:26 PM Confirm and document reason for call. If symptomatic, describe symptoms. ---[name] states she was to get video call and never receive it. She is having a cough and runny nose. Pt states she has had this for 2 weeks. Pt thinks she has a sinus infection. Productive cough with yellow sputum. Denies fevers. Does the patient have any new or worsening symptoms? ---Yes Will a triage be completed? ---Yes Related visit to physician within the last 2 weeks? ---No Does the PT have any chronic conditions? (i.e. diabetes, asthma, this includes High risk factors for pregnancy, etc.) ---Yes List chronic conditions. ---HTN Is the patient pregnant or possibly pregnant? (Ask all females between the ages of 67-55) ---No Is this a behavioral health or substance abuse call? ---No Guidelines Guideline Title Affirmed Question Affirmed Notes Nurse Date/Time (Eastern Time) Cough - Acute Productive [1] MILD difficulty breathing (e.g., minimal/no SOB at rest, SOB with walking, pulse <100) AND [2] still present when not coughing Sharen Counter,  RN, Ashby Dawes 01/18/2023 5:13:59 PM PLEASE NOTE: All timestamps contained within this report are represented as [place] Standard Time. CONFIDENTIALTY NOTICE: This fax transmission is intended only for the addressee. It contains information that is legally privileged, confidential or otherwise protected from use or disclosure. If you are not the intended recipient, you are strictly prohibited from reviewing, disclosing, copying using or disseminating any of this information or taking any action in reliance on or regarding this information. If you have received this fax in error, please notify us immediately by telephone so that we can arrange for its return to [place]. Phone: [phone], Toll-Free: [phone], Fax: [phone] Page: 2 of 2 Call Id: [id number] [place]. Time Eilene Ghazi Time) Disposition Final User 01/18/2023 5:19:02 PM See HCP within 4 Hours (or PCP triage) Yes Sharen Counter, RN, Ashby Dawes Final Disposition 01/18/2023 5:19:02 PM See HCP within 4 Hours (or PCP triage) Yes Sharen Counter, RN, Darius Bump Disagree/Comply Disagree [name] Understands Yes PreDisposition [place] Advice Given Per Guideline SEE HCP (OR PCP TRIAGE) WITHIN 4 HOURS: * IF OFFICE WILL BE CLOSED AND NO PCP (PRIMARY CARE PROVIDER) SECOND-LEVEL TRIAGE: You need to be seen within the next 3 or 4 hours. A nearby Urgent Care Center [facility]) is often a good source of care. Another choice is to go to the ED. Go sooner if you become worse. * UCC: Some UCCs can manage patients who are stable and have less serious symptoms (e.g., minor illnesses and injuries). The triager must know the [facility] capabilities before sending a patient there. If unsure, call ahead. CALL BACK IF: * You become worse CARE ADVICE given per Cough - Acute Productive (Adult) guideline. Comments User: Hal Hope, RN Date/Time Eilene Ghazi Time): 01/18/2023 5:20:49 PM Per GL recommended  UC within 3-4 hours. Pt states her husband drives her around and per pt his response is '  we will discuss it'. Pt might call office in the morning to see what happened to her virtual visit. Referrals [facility]- [facility] -

## 2023-01-19 NOTE — Assessment & Plan Note (Signed)
Hard to tell virtually--but she does feel worse now Known ear problems as well Discussed symptom relief--tylenol and cough med Will give augmentin 875 bid for 7 days--with food

## 2023-01-19 NOTE — Telephone Encounter (Signed)
Per chart review tab pt had VV with Dr Silvio Pate 01/19/23. Sending note to [place] pool.

## 2023-01-20 NOTE — Telephone Encounter (Signed)
Left message to return call to our office.  

## 2023-01-20 NOTE — Telephone Encounter (Signed)
We can inform the patient of this information. I do not think she needs a pain management provider at this juncture. If she is having questions or concerns she is welcome to schedule an office visit

## 2023-01-20 NOTE — Telephone Encounter (Signed)
Kathy Gamble with [facility] neurology said that they could not find a call with their office and pt on 01/15/23 about "a pinched nerve". At this time Dr Melrose Nakayama does not recommend pain mgt for pt but if pt or [name] NP thinks pain mgt would be beneficial for pt that would be up to [name] NP and pt. [name] said previous MR cervical spine was basically within normal limits. Sending note to [name] NP.

## 2023-01-25 NOTE — Telephone Encounter (Signed)
Patient called back let her know information. I have scheduled follow up in office for 2/21. If any new or change in symptoms she will give our office a call.

## 2023-01-25 NOTE — Telephone Encounter (Signed)
Call was picked up but disconnected before I could get any information from person. Sounded like there was some issues with line. Will call back later time.

## 2023-02-03 ENCOUNTER — Encounter: Payer: Self-pay | Admitting: Nurse Practitioner

## 2023-02-03 ENCOUNTER — Telehealth: Payer: Self-pay | Admitting: Nurse Practitioner

## 2023-02-03 ENCOUNTER — Ambulatory Visit (INDEPENDENT_AMBULATORY_CARE_PROVIDER_SITE_OTHER): Payer: Medicare PPO | Admitting: Nurse Practitioner

## 2023-02-03 VITALS — BP 128/86 | HR 96 | Temp 97.3°F | Resp 16 | Ht 69.0 in | Wt 271.0 lb

## 2023-02-03 DIAGNOSIS — M542 Cervicalgia: Secondary | ICD-10-CM | POA: Diagnosis not present

## 2023-02-03 DIAGNOSIS — G8929 Other chronic pain: Secondary | ICD-10-CM | POA: Insufficient documentation

## 2023-02-03 DIAGNOSIS — M546 Pain in thoracic spine: Secondary | ICD-10-CM | POA: Diagnosis not present

## 2023-02-03 NOTE — Assessment & Plan Note (Signed)
Patient has had MRI of cervical spine done in May 2023.  Did show some degenerative changes but nothing that needs surgical intervention.  Patient does sleep with 2 pillows she is being followed by neurology and is currently being titrated up on gabapentin that seems to help.  Patient can use Voltaren gel as needed and directed continue following up with neurology

## 2023-02-03 NOTE — Assessment & Plan Note (Addendum)
Patient can use Voltaren gel as needed.  Exam benign in office no red flags.  Patient can use over-the-counter analgesics sparingly as she does have a history of chronic migraines and neurology wants to make sure patient does not get a medication overuse headache.

## 2023-02-03 NOTE — Progress Notes (Signed)
Acute Office Visit  Subjective:     Patient ID: TENEIL KNAPPE, female    DOB: 1976/08/05, 47 y.o.   MRN: [id number]  Chief Complaint  Patient presents with   Neck Pain    And back pain     Patient is in today for neck and back pain with a history of  HTN, Chronic migraine, asthma, GERD and obesity   Patient is followed by neruology and was seen on 01/13/2023 via telehealth. She complained of neck pain that is causing her difficulty to sleep. They did start her on gabapentin 826m at night then increase to 4038mAm and 8002m.  She is on effecosr 87m51m the am and 150mg56mthe pm   Patient has had a MR C spine done through neurology on 04/18/2022 that showed mild degernative changes in C3-4, C5-6. Without neural compression   States that she is taking the gabapentin and feels like it is helping but she thinks she is getting use to it  States that she has a flat pillow then a larger pillow. States that she will use the heating pad that will help. States that it feel like a muscle ache. Has not tried any creams for the ache. States that she has been taking back and body over the counter.r States that is taking it morning and night sometimes   She mentions that she is having toruble chewing. States that hse will have to chew the food twice.  She is not having any trouble swallowing food does not feel like there is a foreign body in her throat  Review of Systems  Constitutional:  Negative for chills and fever.  Gastrointestinal:  Negative for abdominal pain, diarrhea, nausea and vomiting.  Musculoskeletal:  Positive for back pain and neck pain.  Neurological:  Positive for headaches. Negative for tingling and weakness.         Objective:    BP 128/86   Pulse 96   Temp (!) 97.3 F (36.3 C)   Resp 16   Ht 5' 9"$  (1.753 m)   Wt 271 lb (122.9 kg)   LMP  (LMP Unknown)   SpO2 99%   BMI 40.02 kg/m  BP Readings from Last 3 Encounters:  02/03/23 128/86  11/16/22 130/86   11/02/22 (!) 148/106   Wt Readings from Last 3 Encounters:  02/03/23 271 lb (122.9 kg)  11/16/22 269 lb (122 kg)  11/02/22 265 lb 8 oz (120.4 kg)      Physical Exam Vitals and nursing note reviewed.  Constitutional:      Appearance: Normal appearance.  Cardiovascular:     Rate and Rhythm: Normal rate and regular rhythm.     Heart sounds: Normal heart sounds.  Pulmonary:     Effort: Pulmonary effort is normal.     Breath sounds: Normal breath sounds.  Musculoskeletal:       Arms:     Cervical back: Tenderness present. No bony tenderness or crepitus. No pain with movement. Normal range of motion.     Comments: Bilateral upper extremity strength 5/5 Bilateral bicep tendons 2+  Neurological:     Mental Status: She is alert.     No results found for any visits on 02/03/23.      Assessment & Plan:   Problem List Items Addressed This Visit       Other   Thoracic spine pain    Patient can use Voltaren gel as needed.  Exam benign in office  no red flags.  Patient can use over-the-counter analgesics sparingly as she does have a history of chronic migraines and neurology wants to make sure patient does not get a medication overuse headache.      Neck pain - Primary    Patient has had MRI of cervical spine done in May 2023.  Did show some degenerative changes but nothing that needs surgical intervention.  Patient does sleep with 2 pillows she is being followed by neurology and is currently being titrated up on gabapentin that seems to help.  Patient can use Voltaren gel as needed and directed continue following up with neurology       No orders of the defined types were placed in this encounter.   Return if symptoms worsen or fail to improve.  [name], NP

## 2023-02-03 NOTE — Telephone Encounter (Signed)
Patient called and said she thought [name] was going to send in a script for Voltaren Gel but said that [facility] said they hadnt received anything yet. Please advise

## 2023-02-03 NOTE — Patient Instructions (Signed)
Nice to see you today Continue taking the medications as they are prescribed Follow up with Dr. Melrose Nakayama as recommended You can get some Voltaren Gel over the counter at the store to help with the discomfort. Try to reduce the "back and body" medication use

## 2023-02-04 NOTE — Telephone Encounter (Signed)
I told her that the medication is over the counter. She does not need a script

## 2023-02-05 ENCOUNTER — Telehealth: Payer: Self-pay

## 2023-02-05 NOTE — Telephone Encounter (Signed)
The gabapentin could be the reason she is feeling sleepy or tired. The gabapentin is prescribed by Dr. Melrose Nakayama her neurologist that manages her chronic migraines. I suggest she let his office know of the increased tiredness and sleepiness. Also can discuss the headaches with him further. The other issues I am happy to see her in office for

## 2023-02-05 NOTE — Telephone Encounter (Signed)
Called and spoke to pt. Scheduled with Dr. Silvio Pate for concerns

## 2023-02-05 NOTE — Telephone Encounter (Signed)
Pt called and said she was seen by [name] NP on 02/03/23 and pt forgot to mention some things. Pt said she is weak with no energy; pt sinus bother her with weather changes. Pt said she is sleeping too much. Pt said she has problems relaxing. Pt had nose bleed one day; pt not sure what day and pt said was not a lot of blood; that it was more like blood with sinuses. Pt has no fever, no S/T and no cough. No CP or SOB. Pt said she feels like she is "knocked out" could not explain further except pt sleeps a lot and has no energy. Pt said she has H/A 24/7 and pt taking gabapentin in morning and night as instructed by H/A doctor. Pt said now H/A is pain level of 7. I advised pt to contact her H/A doctor since he prescribes the gabapentin but pt said she has not called H/A doctor and since pt just saw Matt pt wants Matt's opinion what she should do about H/A. Offered to schedule pt an appt to review above symptoms she forgot to tell Matt about at 02/03/23 appt but pt said no just to send note to [facility]. Sending note to [name] NP and [place] pool. UC & ED precautions given and pt voiced understanding.

## 2023-02-05 NOTE — Telephone Encounter (Signed)
Called and spoke to pt she said she found some at her house. I told her that if that runs out that she can get OTC.

## 2023-02-08 ENCOUNTER — Other Ambulatory Visit: Payer: Self-pay | Admitting: Nurse Practitioner

## 2023-02-09 ENCOUNTER — Ambulatory Visit (INDEPENDENT_AMBULATORY_CARE_PROVIDER_SITE_OTHER): Payer: Medicare PPO | Admitting: Internal Medicine

## 2023-02-09 ENCOUNTER — Encounter: Payer: Self-pay | Admitting: Internal Medicine

## 2023-02-09 VITALS — BP 112/80 | HR 92 | Temp 97.3°F | Ht 69.0 in | Wt 275.0 lb

## 2023-02-09 DIAGNOSIS — J0110 Acute frontal sinusitis, unspecified: Secondary | ICD-10-CM | POA: Diagnosis not present

## 2023-02-09 MED ORDER — AMOXICILLIN 500 MG PO TABS: 1000.0000 mg | ORAL_TABLET | Freq: Two times a day (BID) | ORAL | 0 refills | Status: AC

## 2023-02-09 NOTE — Progress Notes (Signed)
Subjective:    Patient ID: Kathy Gamble, female    DOB: Apr 13, 1976, 47 y.o.   MRN: [id number]  HPI Here due to sinus symptoms With son  Has been spitting up mucus --some has had blood Started about a week ago--relates to her sinuses No fever Some headache---not new (more in neck and some in front) Some cough--with post nasal drip Does have allergy issues Some ear pain---told she needs tubes No sore throat Some SOB---and wheezing (known asthma). Relates worsening to the weather (as well as the weather)  Also fell 5 days ago--while picking up stuff in closet Lost balance Bruising on right arm and right leg Did ice it No problems using arm and leg--just some soreness  Current Outpatient Medications on File Prior to Visit  Medication Sig Dispense Refill   AIMOVIG 140 MG/ML SOAJ      albuterol (VENTOLIN HFA) 108 (90 Base) MCG/ACT inhaler INHALE 1-2 PUFFS into THE lungs EVERY 6 HOURS AS NEEDED FOR SHORTNESS OF BREATH OR wheezing 18 g 0   amLODipine (NORVASC) 10 MG tablet Take 1 tablet (10 mg total) by mouth daily. 90 tablet 0   gabapentin (NEURONTIN) 400 MG capsule Take 400 mg by mouth at bedtime.     levocetirizine (XYZAL) 5 MG tablet TAKE ONE TABLET BY MOUTH ONCE daily AS NEEDED 90 tablet 2   losartan (COZAAR) 25 MG tablet TAKE ONE TABLET BY MOUTH ONCE DAILY 90 tablet 1   montelukast (SINGULAIR) 10 MG tablet TAKE ONE TABLET BY MOUTH ONCE DAILY FOR sinuses 90 tablet 3   Multiple Vitamin (MULTIVITAMIN) capsule Take 1 capsule by mouth daily.     omeprazole (PRILOSEC) 20 MG capsule TAKE ONE CAPSULE BY MOUTH ONCE DAILY 30 capsule 0   No current facility-administered medications on file prior to visit.    Allergies  Allergen Reactions   Bee Venom Itching   Nortriptyline Other (See Comments)    GI distress   Latex Rash    The powder in the gloves causes rash    Past Medical History:  Diagnosis Date   Allergy    Bee sting allergy 03/02/2015   Chicken pox    Frequent  headaches    GERD (gastroesophageal reflux disease)     Past Surgical History:  Procedure Laterality Date   ADENOIDECTOMY     TONSILLECTOMY      Family History  Problem Relation Age of Onset   Stroke Mother    Mental retardation Mother    Cancer Maternal Grandmother    Cancer Paternal Grandmother        Breast   Heart disease Paternal Grandfather     Social History   Socioeconomic History   Marital status: Married    Spouse name: Not on file   Number of children: Not on file   Years of education: Not on file   Highest education level: Not on file  Occupational History   Occupation: disabled  Tobacco Use   Smoking status: Never   Smokeless tobacco: Never  Vaping Use   Vaping Use: Never used  Substance and Sexual Activity   Alcohol use: No    Alcohol/week: 0.0 standard drinks of alcohol   Drug use: Not Currently    Types: Cocaine   Sexual activity: Yes    Birth control/protection: None  Other Topics Concern   Not on file  Social History Narrative   Cognitive dysfunction - lives with her husband   Social Determinants of Radio broadcast assistant  Strain: Low Risk  (04/17/2022)   Overall Financial Resource Strain (CARDIA)    Difficulty of Paying Living Expenses: Not hard at all  Food Insecurity: No Food Insecurity (04/17/2022)   Hunger Vital Sign    Worried About Running Out of Food in the Last Year: Never true    Ran Out of Food in the Last Year: Never true  Transportation Needs: No Transportation Needs (04/17/2022)   PRAPARE - Hydrologist (Medical): No    Lack of Transportation (Non-Medical): No  Physical Activity: Insufficiently Active (04/17/2022)   Exercise Vital Sign    Days of Exercise per Week: 7 days    Minutes of Exercise per Session: 20 min  Stress: Stress Concern Present (04/17/2022)   [place]    Feeling of Stress : To some extent  Social Connections:  Socially Integrated (04/17/2022)   Social Connection and Isolation Panel [NHANES]    Frequency of Communication with Friends and Family: More than three times a week    Frequency of Social Gatherings with Friends and Family: More than three times a week    Attends Religious Services: 1 to 4 times per year    Active Member of [employer] or Organizations: Yes    Attends Archivist Meetings: 1 to 4 times per year    Marital Status: Married  Human resources officer Violence: Not At Risk (04/17/2022)   Humiliation, Afraid, Rape, and Kick questionnaire    Fear of Current or Ex-Partner: No    Emotionally Abused: No    Physically Abused: No    Sexually Abused: No   Review of Systems Some dizziness--maybe from AM gabapentin Some indigestion--but has to work hard on chewing food (limited teeth) Uses mouth guard at night    Objective:   Physical Exam Constitutional:      Appearance: Normal appearance.  HENT:     Head:     Comments: Mild frontal tenderness    Ears:     Comments: Scarring both TMs but no inflammation    Nose:     Comments: Inflammation bilateral    Mouth/Throat:     Pharynx: No oropharyngeal exudate or posterior oropharyngeal erythema.  Pulmonary:     Effort: Pulmonary effort is normal.     Breath sounds: Normal breath sounds. No wheezing or rales.  Skin:    Comments: Purplish bruise right lateral hip and arm----no restriction in ROM, etc  Neurological:     Mental Status: She is alert.            Assessment & Plan:

## 2023-02-09 NOTE — Assessment & Plan Note (Signed)
Will try amoxil 1000 mg bid x 7 days Add flonase for allergies Tylenol prn

## 2023-02-10 ENCOUNTER — Other Ambulatory Visit: Payer: Self-pay | Admitting: Nurse Practitioner

## 2023-02-10 DIAGNOSIS — J302 Other seasonal allergic rhinitis: Secondary | ICD-10-CM

## 2023-02-11 ENCOUNTER — Other Ambulatory Visit: Payer: Self-pay | Admitting: Nurse Practitioner

## 2023-02-11 DIAGNOSIS — J302 Other seasonal allergic rhinitis: Secondary | ICD-10-CM

## 2023-02-12 ENCOUNTER — Telehealth: Payer: Self-pay | Admitting: Nurse Practitioner

## 2023-02-12 NOTE — Telephone Encounter (Signed)
Refill sent in via refill request.

## 2023-02-12 NOTE — Telephone Encounter (Signed)
Prescription Request  02/12/2023   LOV: 02/03/2023  What is the name of the medication or equipment? fluticasone (FLONASE) 50 MCG/ACT nasal spray [id number]   Have you contacted your pharmacy to request a refill? Pharmacy called to refill meds  Which pharmacy would you like this sent to?  [facility] - [place], [place] - [address] [place] [address] Phone: [phone] Fax: [phone]    Patient notified that their request is being sent to the clinical staff for review and that they should receive a response within 2 business days.   Please advise at [phone]  Meds haven't been refilled since 09/25/21

## 2023-03-29 ENCOUNTER — Other Ambulatory Visit: Payer: Self-pay | Admitting: Nurse Practitioner

## 2023-03-29 ENCOUNTER — Encounter: Payer: Self-pay | Admitting: Nurse Practitioner

## 2023-03-29 NOTE — Telephone Encounter (Signed)
error 

## 2023-03-30 ENCOUNTER — Telehealth: Payer: Self-pay | Admitting: Nurse Practitioner

## 2023-03-30 NOTE — Telephone Encounter (Signed)
Pt called to let Toney Reil know that over the weekend, she was vomiting. Pt states at first, she was throwing up food, then it changed to clear & yellow mucus. Pt asked if Toney Reil had any suggestions for any diets she should start? Pt states she is gaining weight & was told by mom the extra weight doesn't look good. Call back # [phone]

## 2023-03-31 ENCOUNTER — Other Ambulatory Visit: Payer: Self-pay | Admitting: Nurse Practitioner

## 2023-03-31 ENCOUNTER — Other Ambulatory Visit: Payer: Self-pay | Admitting: Family Medicine

## 2023-03-31 DIAGNOSIS — J302 Other seasonal allergic rhinitis: Secondary | ICD-10-CM

## 2023-03-31 DIAGNOSIS — I10 Essential (primary) hypertension: Secondary | ICD-10-CM

## 2023-03-31 NOTE — Telephone Encounter (Signed)
Can we find out if she is having nausea and vomiting now, if so she needs to be triaged. She will need to have an office visit to discuss weight management

## 2023-03-31 NOTE — Telephone Encounter (Signed)
noted 

## 2023-03-31 NOTE — Telephone Encounter (Signed)
Left a message on voicemail for patient to call the office back. 

## 2023-03-31 NOTE — Telephone Encounter (Signed)
Spoke to patient by telephone and was advised that she was not actually vomiting over the weekend. Patient stated that she was getting up clear mucus from her throat. Patient stated that this happened over the weekend. Patient denies any vomiting or nausea at this time. Patient stated that she was on an antibiotic several weeks back for her sinuses.. Patient stated that she is getting clear mucus out of her nose now at times. Patient scheduled an appointment with Audria Nine NP 04/05/23 at 10:40 am to discuss weight management.

## 2023-04-01 ENCOUNTER — Telehealth: Payer: Self-pay | Admitting: Nurse Practitioner

## 2023-04-01 NOTE — Telephone Encounter (Signed)
Kim from [employer] called stating the pt stated she needed a refill on amLODipine. Selena Batten stated she was told by pt she takes 5 mg of amLODipine. Told Kim we have on amLODipine 10 mg on her med list, not 5mg , last refill was 04/01/22 with 90 day supply. Selena Batten is asking is the pt suppose to be on or start the 5mg  or not? Selena Batten stated the pt said she didn't want to be on losartan.Call back # [phone]

## 2023-04-02 NOTE — Telephone Encounter (Signed)
Called patient she states she is taking both as directed and did not say she wanted to stop either one. She is not sure why they said that. She has appointment Monday for ears advised she bring medications with her.

## 2023-04-02 NOTE — Telephone Encounter (Signed)
The patient is suppose to be on amlodipine  and losartan . That is why I denied the amlodipine  refill request.  Can we find out why the patient doesn't want to be on losartan?

## 2023-04-05 ENCOUNTER — Ambulatory Visit (INDEPENDENT_AMBULATORY_CARE_PROVIDER_SITE_OTHER): Payer: Medicare PPO | Admitting: Nurse Practitioner

## 2023-04-05 ENCOUNTER — Encounter: Payer: Self-pay | Admitting: Nurse Practitioner

## 2023-04-05 DIAGNOSIS — Z713 Dietary counseling and surveillance: Secondary | ICD-10-CM | POA: Diagnosis not present

## 2023-04-05 NOTE — Patient Instructions (Signed)
Nice to see you today Try to have 3 meals a day and cut out snacking Water and flavored water is ok to drink. Try to cut out sodas Try to walk down and back to your mailbox 2 times a day 5 days a week  If you want to see a nutritionist let me know If you want to see the healthy weight and wellness clinic let me know

## 2023-04-05 NOTE — Assessment & Plan Note (Signed)
Patient is interested in losing weight.  We did discuss the possibility of going to a nutritionist and the healthy weight and wellness clinic.  Patient will do 3 meals a day try to avoid snacks and sugary drinks and foods.  She also try to increase her exercise to 30 minutes 5 times a day.  States she will walk down and back for mailbox every day will told her to double that and do that 2 times.  If she decides she would like to see nutritionist or healthy weight wellness she will reach out to me.  Did review patient's last A1c which was 5.4%.  TSH within normal limits.  Patient's cholesterol also within normal limits.  She does have the comorbidity of hypertension which she is medicated for and is well-controlled

## 2023-04-05 NOTE — Progress Notes (Signed)
Established Patient Office Visit  Subjective   Patient ID: Kathy Gamble, female    DOB: [birth year]  Age: 47 y.o. MRN: [id number]  Chief Complaint  Patient presents with   Weight Gain    Patients mother thinks she has gained too much weight and needs to be on diet.  Patient states she feels she needs to walk more, and regular exercise. She would like advise on what diet she should be doing.  She states her stool was yellow this morning, first time this has happened.     HPI  Weight management:  States that she is eating three times a day. States 3 meals a days. States that she does not eat between the meals but does sometimes. States that she does drink flavored water. States that sometimes she will drink coffee. Does not drink sodas as often. States she will have one when she needs a pick me up. States that she does like sweets. States that she will have one a day to one a week. States that she will have a cookie or cake or ice cream.  Exercise: states that she has been doing an ellipicatl oncea week if she does not go outside. States that she will be on it for approx 20 mins when she does it.  States that if she walks outside she will walk to the mailbox every day that is 10-15 mins round trip.   Review of Systems  Constitutional:  Negative for chills and fever.  Respiratory:  Negative for shortness of breath.   Cardiovascular:  Negative for chest pain.  Neurological:  Positive for headaches.      Objective:     BP 130/88   Pulse 87   Temp 97.9 F (36.6 C) (Temporal)   Ht  (1.753 m)   Wt 279 lb (126.6 kg)   LMP  (LMP Unknown)   SpO2 99%   BMI 41.20 kg/m  BP Readings from Last 3 Encounters:  04/05/23 130/88  02/09/23 112/80  02/03/23 128/86   Wt Readings from Last 3 Encounters:  04/05/23 279 lb (126.6 kg)  02/09/23 275 lb (124.7 kg)  02/03/23 271 lb (122.9 kg)      Physical Exam Vitals and nursing note reviewed.  Constitutional:      Appearance:  Normal appearance.  Cardiovascular:     Rate and Rhythm: Normal rate and regular rhythm.     Heart sounds: Normal heart sounds.  Pulmonary:     Effort: Pulmonary effort is normal.     Breath sounds: Normal breath sounds.  Neurological:     Mental Status: She is alert.      No results found for any visits on 04/05/23.    The 10-year ASCVD risk score (Arnett DK, et al., 2019) is: 0.7%    Assessment & Plan:   Problem List Items Addressed This Visit       Other   Morbid obesity - Primary    Patient is been steadily gaining weight.  Approximately 25 pounds since we first met in August 2022.  Work on lifestyle modifications      Encounter for weight loss counseling    Patient is interested in losing weight.  We did discuss the possibility of going to a nutritionist and the healthy weight and wellness clinic.  Patient will do 3 meals a day try to avoid snacks and sugary drinks and foods.  She also try to increase her exercise to 30 minutes 5 times a day.  States she will walk down and back for mailbox every day will told her to double that and do that 2 times.  If she decides she would like to see nutritionist or healthy weight wellness she will reach out to me.  Did review patient's last A1c which was 5.4%.  TSH within normal limits.  Patient's cholesterol also within normal limits.  She does have the comorbidity of hypertension which she is medicated for and is well-controlled       Return if symptoms worsen or fail to improve.    Audria Nine, NP

## 2023-04-05 NOTE — Assessment & Plan Note (Signed)
Patient is been steadily gaining weight.  Approximately 25 pounds since we first met in August 2022.  Work on lifestyle modifications

## 2023-04-06 ENCOUNTER — Telehealth: Payer: Self-pay | Admitting: Nurse Practitioner

## 2023-04-06 NOTE — Telephone Encounter (Signed)
Contacted Kathy Gamble to schedule their annual wellness visit. Appointment made for 04/21/2023.  [facility] Care Guide [facility] AWV TEAM Direct Dial: [phone]

## 2023-04-07 ENCOUNTER — Telehealth: Payer: Self-pay | Admitting: Nurse Practitioner

## 2023-04-07 ENCOUNTER — Other Ambulatory Visit: Payer: Self-pay

## 2023-04-07 DIAGNOSIS — I10 Essential (primary) hypertension: Secondary | ICD-10-CM

## 2023-04-07 MED ORDER — AMLODIPINE BESYLATE 10 MG PO TABS
10.0000 mg | ORAL_TABLET | Freq: Every day | ORAL | 1 refills | Status: DC
Start: 2023-04-07 — End: 2023-08-20

## 2023-04-07 MED ORDER — OMEPRAZOLE 20 MG PO CPDR: 20.0000 mg | DELAYED_RELEASE_CAPSULE | Freq: Every day | ORAL | 0 refills | Status: DC

## 2023-04-07 NOTE — Telephone Encounter (Signed)
RX request sent to provider.

## 2023-04-07 NOTE — Telephone Encounter (Signed)
amLODipine (NORVASC) 10 MG tablet omeprazole (PRILOSEC) 20 MG capsule  LOV 04/05/2023 NOV 11/22/2023

## 2023-04-07 NOTE — Telephone Encounter (Signed)
Prescription Request  04/07/2023  LOV: 04/05/2023  What is the name of the medication or equipment? amLODipine (NORVASC) 10 MG tablet & omeprazole (PRILOSEC) 20 MG capsule  Have you contacted your pharmacy to request a refill? No   Which pharmacy would you like this sent to?  [facility] - [place], [place] - [address] [place] [address] Phone: [phone] Fax: [phone]    Patient notified that their request is being sent to the clinical staff for review and that they should receive a response within 2 business days.   Please advise at Mobile [phone] (mobile)  Pt asked did she need to continue amLODipine? Pt also requested a 90 day supply for omeprazole

## 2023-04-12 ENCOUNTER — Telehealth: Payer: Self-pay | Admitting: Nurse Practitioner

## 2023-04-12 NOTE — Telephone Encounter (Signed)
Called and informed pt to call the neurologist to get these refilled.

## 2023-04-12 NOTE — Telephone Encounter (Signed)
Prescription Request  04/12/2023  LOV: 04/05/2023  What is the name of the medication or equipment?  AIMOVIG 140 MG/ML SOAJ  (11 pills left)   gabapentin (NEURONTIN) 400 MG capsule  (9 pills left)  Patient states these two are not in her pill pack  Have you contacted your pharmacy to request a refill? Yes   Which pharmacy would you like this sent to?  [facility] - [place], [place] - [address] [place] [address] Phone: [phone] Fax: [phone]    Patient notified that their request is being sent to the clinical staff for review and that they should receive a response within 2 business days.   Please advise at [facility] [phone]

## 2023-04-12 NOTE — Telephone Encounter (Signed)
Patient needs to reach out to her neurologist Dr Glenna Durand, he prescribes those medications

## 2023-04-21 ENCOUNTER — Ambulatory Visit (INDEPENDENT_AMBULATORY_CARE_PROVIDER_SITE_OTHER): Payer: Medicare PPO

## 2023-04-21 VITALS — Ht 69.0 in | Wt 279.0 lb

## 2023-04-21 DIAGNOSIS — Z0000 Encounter for general adult medical examination without abnormal findings: Secondary | ICD-10-CM | POA: Diagnosis not present

## 2023-04-21 DIAGNOSIS — Z1211 Encounter for screening for malignant neoplasm of colon: Secondary | ICD-10-CM | POA: Diagnosis not present

## 2023-04-21 DIAGNOSIS — Z1231 Encounter for screening mammogram for malignant neoplasm of breast: Secondary | ICD-10-CM | POA: Diagnosis not present

## 2023-04-21 NOTE — Patient Instructions (Signed)
Kathy Gamble , Thank you for taking time to come for your Medicare Wellness Visit. I appreciate your ongoing commitment to your health goals. Please review the following plan we discussed and let me know if I can assist you in the future.   These are the goals we discussed:  Goals      Patient Stated     Stay healthy and happy      Patient Stated     Lose weight.        This is a list of the screening recommended for you and due dates:  Health Maintenance  Topic Date Due   HIV Screening  Never done   Hepatitis C Screening: USPSTF Recommendation to screen - Ages 69-79 yo.  Never done   Colon Cancer Screening  Never done   Medicare Annual Wellness Visit  04/18/2023   COVID-19 Vaccine (4 - 2023-24 season) 04/21/2023*   Flu Shot  07/15/2023   Pap Smear  12/03/2024   DTaP/Tdap/Td vaccine (2 - Td or Tdap) 01/02/2025   HPV Vaccine  Aged Out  *Topic was postponed. The date shown is not the original due date.    Advanced directives: none  Conditions/risks identified: Aim for 30 minutes of exercise or brisk walking, 6-8 glasses of water, and 5 servings of fruits and vegetables each day.   Next appointment: Follow up in one year for your annual wellness visit. 04/24/24 @ 9:30 telephone  Preventive Care 40-64 Years, Female Preventive care refers to lifestyle choices and visits with your health care provider that can promote health and wellness. What does preventive care include? A yearly physical exam. This is also called an annual well check. Dental exams once or twice a year. Routine eye exams. Ask your health care provider how often you should have your eyes checked. Personal lifestyle choices, including: Daily care of your teeth and gums. Regular physical activity. Eating a healthy diet. Avoiding tobacco and drug use. Limiting alcohol use. Practicing safe sex. Taking low-dose aspirin daily starting at age 87. Taking vitamin and mineral supplements as recommended by your health  care provider. What happens during an annual well check? The services and screenings done by your health care provider during your annual well check will depend on your age, overall health, lifestyle risk factors, and family history of disease. Counseling  Your health care provider may ask you questions about your: Alcohol use. Tobacco use. Drug use. Emotional well-being. Home and relationship well-being. Sexual activity. Eating habits. Work and work Astronomer. Method of birth control. Menstrual cycle. Pregnancy history. Screening  You may have the following tests or measurements: Height, weight, and BMI. Blood pressure. Lipid and cholesterol levels. These may be checked every 5 years, or more frequently if you are over 16 years old. Skin check. Lung cancer screening. You may have this screening every year starting at age 26 if you have a 30-pack-year history of smoking and currently smoke or have quit within the past 15 years. Fecal occult blood test (FOBT) of the stool. You may have this test every year starting at age 63. Flexible sigmoidoscopy or colonoscopy. You may have a sigmoidoscopy every 5 years or a colonoscopy every 10 years starting at age 91. Hepatitis C blood test. Hepatitis B blood test. Sexually transmitted disease (STD) testing. Diabetes screening. This is done by checking your blood sugar (glucose) after you have not eaten for a while (fasting). You may have this done every 1-3 years. Mammogram. This may be done every 1-2  years. Talk to your health care provider about when you should start having regular mammograms. This may depend on whether you have a family history of breast cancer. BRCA-related cancer screening. This may be done if you have a family history of breast, ovarian, tubal, or peritoneal cancers. Pelvic exam and Pap test. This may be done every 3 years starting at age 28. Starting at age 42, this may be done every 5 years if you have a Pap test in  combination with an HPV test. Bone density scan. This is done to screen for osteoporosis. You may have this scan if you are at high risk for osteoporosis. Discuss your test results, treatment options, and if necessary, the need for more tests with your health care provider. Vaccines  Your health care provider may recommend certain vaccines, such as: Influenza vaccine. This is recommended every year. Tetanus, diphtheria, and acellular pertussis (Tdap, Td) vaccine. You may need a Td booster every 10 years. Zoster vaccine. You may need this after age 57. Pneumococcal 13-valent conjugate (PCV13) vaccine. You may need this if you have certain conditions and were not previously vaccinated. Pneumococcal polysaccharide (PPSV23) vaccine. You may need one or two doses if you smoke cigarettes or if you have certain conditions. Talk to your health care provider about which screenings and vaccines you need and how often you need them. This information is not intended to replace advice given to you by your health care provider. Make sure you discuss any questions you have with your health care provider. Document Released: 12/27/2015 Document Revised: 08/19/2016 Document Reviewed: 10/01/2015 Elsevier Interactive Patient Education  2017 [employer].    Fall Prevention in the Home Falls can cause injuries. They can happen to people of all ages. There are many things you can do to make your home safe and to help prevent falls. What can I do on the outside of my home? Regularly fix the edges of walkways and driveways and fix any cracks. Remove anything that might make you trip as you walk through a door, such as a raised step or threshold. Trim any bushes or trees on the path to your home. Use bright outdoor lighting. Clear any walking paths of anything that might make someone trip, such as rocks or tools. Regularly check to see if handrails are loose or broken. Make sure that both sides of any steps have  handrails. Any raised decks and porches should have guardrails on the edges. Have any leaves, snow, or ice cleared regularly. Use sand or salt on walking paths during winter. Clean up any spills in your garage right away. This includes oil or grease spills. What can I do in the bathroom? Use night lights. Install grab bars by the toilet and in the tub and shower. Do not use towel bars as grab bars. Use non-skid mats or decals in the tub or shower. If you need to sit down in the shower, use a plastic, non-slip stool. Keep the floor dry. Clean up any water that spills on the floor as soon as it happens. Remove soap buildup in the tub or shower regularly. Attach bath mats securely with double-sided non-slip rug tape. Do not have throw rugs and other things on the floor that can make you trip. What can I do in the bedroom? Use night lights. Make sure that you have a light by your bed that is easy to reach. Do not use any sheets or blankets that are too big for your bed. They  should not hang down onto the floor. Have a firm chair that has side arms. You can use this for support while you get dressed. Do not have throw rugs and other things on the floor that can make you trip. What can I do in the kitchen? Clean up any spills right away. Avoid walking on wet floors. Keep items that you use a lot in easy-to-reach places. If you need to reach something above you, use a strong step stool that has a grab bar. Keep electrical cords out of the way. Do not use floor polish or wax that makes floors slippery. If you must use wax, use non-skid floor wax. Do not have throw rugs and other things on the floor that can make you trip. What can I do with my stairs? Do not leave any items on the stairs. Make sure that there are handrails on both sides of the stairs and use them. Fix handrails that are broken or loose. Make sure that handrails are as long as the stairways. Check any carpeting to make sure  that it is firmly attached to the stairs. Fix any carpet that is loose or worn. Avoid having throw rugs at the top or bottom of the stairs. If you do have throw rugs, attach them to the floor with carpet tape. Make sure that you have a light switch at the top of the stairs and the bottom of the stairs. If you do not have them, ask someone to add them for you. What else can I do to help prevent falls? Wear shoes that: Do not have high heels. Have rubber bottoms. Are comfortable and fit you well. Are closed at the toe. Do not wear sandals. If you use a stepladder: Make sure that it is fully opened. Do not climb a closed stepladder. Make sure that both sides of the stepladder are locked into place. Ask someone to hold it for you, if possible. Clearly mark and make sure that you can see: Any grab bars or handrails. First and last steps. Where the edge of each step is. Use tools that help you move around (mobility aids) if they are needed. These include: Canes. Walkers. Scooters. Crutches. Turn on the lights when you go into a dark area. Replace any light bulbs as soon as they burn out. Set up your furniture so you have a clear path. Avoid moving your furniture around. If any of your floors are uneven, fix them. If there are any pets around you, be aware of where they are. Review your medicines with your doctor. Some medicines can make you feel dizzy. This can increase your chance of falling. Ask your doctor what other things that you can do to help prevent falls. This information is not intended to replace advice given to you by your health care provider. Make sure you discuss any questions you have with your health care provider. Document Released: 09/26/2009 Document Revised: 05/07/2016 Document Reviewed: 01/04/2015 Elsevier Interactive Patient Education  2017 [employer].

## 2023-04-21 NOTE — Progress Notes (Signed)
I connected with  Kathy Gamble on 04/21/23 by a audio enabled telemedicine application and verified that I am speaking with the correct person using two identifiers.  Patient Location: Home  Provider Location: Home Office  I discussed the limitations of evaluation and management by telemedicine. The patient expressed understanding and agreed to proceed.  Subjective:   Kathy Gamble is a 47 y.o. female who presents for Medicare Annual (Subsequent) preventive examination.  Review of Systems      Cardiac Risk Factors include: advanced age (>29men, >55 women);hypertension;sedentary lifestyle;obesity (BMI >30kg/m2)     Objective:    Today's Vitals   04/21/23 0954  Weight: 279 lb (126.6 kg)  Height: 5\' 9"  (1.753 m)   Body mass index is 41.2 kg/m.     04/21/2023   10:15 AM 04/17/2022    9:29 AM 03/18/2022   11:20 AM 03/16/2022    6:38 PM 04/13/2016    9:59 AM 01/17/2016    5:50 PM  Advanced Directives  Does Patient Have a Medical Advance Directive? No No No No Yes No  Would patient like information on creating a medical advance directive? No - Patient declined No - Patient declined        Current Medications (verified) Outpatient Encounter Medications as of 04/21/2023  Medication Sig   albuterol (VENTOLIN HFA) 108 (90 Base) MCG/ACT inhaler INHALE 1-2 PUFFS BY MOUTH into THE lungs EVERY 6 HOURS AS NEEDED FOR WHEEZING AND/OR SHORTNESS OF BREATH   amLODipine (NORVASC) 10 MG tablet Take 1 tablet (10 mg total) by mouth daily.   fluticasone (FLONASE) 50 MCG/ACT nasal spray INSTILL 1 SPRAY IN EACH NOSTRIL ONCE DAILY   gabapentin (NEURONTIN) 400 MG capsule Take 400 mg by mouth at bedtime.   montelukast (SINGULAIR) 10 MG tablet TAKE ONE TABLET BY MOUTH ONCE DAILY FOR sinuses   Multiple Vitamin (MULTIVITAMIN) capsule Take 1 capsule by mouth daily.   omeprazole (PRILOSEC) 20 MG capsule Take 1 capsule (20 mg total) by mouth daily.   venlafaxine (EFFEXOR) 75 MG tablet Take 75 mg by mouth at bedtime.    AIMOVIG 140 MG/ML SOAJ  (Patient not taking: Reported on 04/05/2023)   levocetirizine (XYZAL) 5 MG tablet TAKE ONE TABLET BY MOUTH ONCE daily AS NEEDED (Patient not taking: Reported on 04/05/2023)   losartan (COZAAR) 25 MG tablet TAKE ONE TABLET BY MOUTH ONCE DAILY (Patient not taking: Reported on 04/21/2023)   No facility-administered encounter medications on file as of 04/21/2023.    Allergies (verified) Bee venom, Nortriptyline, and Latex   History: Past Medical History:  Diagnosis Date   Allergy    Bee sting allergy 03/02/2015   Chicken pox    Frequent headaches    GERD (gastroesophageal reflux disease)    Past Surgical History:  Procedure Laterality Date   ADENOIDECTOMY     TONSILLECTOMY     Family History  Problem Relation Age of Onset   Stroke Mother    Mental retardation Mother    Cancer Maternal Grandmother    Cancer Paternal Grandmother        Breast   Heart disease Paternal Grandfather    Social History   Socioeconomic History   Marital status: Married    Spouse name: Not on file   Number of children: Not on file   Years of education: Not on file   Highest education level: Not on file  Occupational History   Occupation: disabled  Tobacco Use   Smoking status: Never   Smokeless tobacco:  Never  Vaping Use   Vaping Use: Never used  Substance and Sexual Activity   Alcohol use: No    Alcohol/week: 0.0 standard drinks of alcohol   Drug use: Not Currently    Types: Cocaine   Sexual activity: Yes    Birth control/protection: None  Other Topics Concern   Not on file  Social History Narrative   Cognitive dysfunction - lives with her husband   Social Determinants of Health   Financial Resource Strain: Low Risk  (04/21/2023)   Overall Financial Resource Strain (CARDIA)    Difficulty of Paying Living Expenses: Not hard at all  Food Insecurity: No Food Insecurity (04/21/2023)   Hunger Vital Sign    Worried About Running Out of Food in the Last Year: Never true     Ran Out of Food in the Last Year: Never true  Transportation Needs: No Transportation Needs (04/21/2023)   PRAPARE - Administrator, Civil Service (Medical): No    Lack of Transportation (Non-Medical): No  Physical Activity: Insufficiently Active (04/21/2023)   Exercise Vital Sign    Days of Exercise per Week: 2 days    Minutes of Exercise per Session: 20 min  Stress: No Stress Concern Present (04/21/2023)   [employer] of Occupational Health - Occupational Stress Questionnaire    Feeling of Stress : Not at all  Social Connections: Moderately Integrated (04/21/2023)   Social Connection and Isolation Panel [NHANES]    Frequency of Communication with Friends and Family: More than three times a week    Frequency of Social Gatherings with Friends and Family: More than three times a week    Attends Religious Services: More than 4 times per year    Active Member of [employer] or Organizations: No    Attends Engineer, structural: Never    Marital Status: Married    Tobacco Counseling Counseling given: Not Answered   Clinical Intake:  Pre-visit preparation completed: Yes  Pain : No/denies pain     Nutritional Risks: None Diabetes: No  How often do you need to have someone help you when you read instructions, pamphlets, or other written materials from your doctor or pharmacy?: 1 - Never  Diabetic? no  Interpreter Needed?: No  Information entered by :: C.Denyce Harr LPN   Activities of Daily Living    04/21/2023   10:16 AM  In your present state of health, do you have any difficulty performing the following activities:  Hearing? 0  Vision? 0  Difficulty concentrating or making decisions? 0  Walking or climbing stairs? 0  Dressing or bathing? 0  Doing errands, shopping? 0  Preparing Food and eating ? N  Using the Toilet? N  In the past six months, have you accidently leaked urine? N  Do you have problems with loss of bowel control? N  Managing your  Medications? N  Managing your Finances? N  Housekeeping or managing your Housekeeping? N    Patient Care Team: Eden Emms, NP as PCP - General Genelle Gather, OD (Optometry)  Indicate any recent Medical Services you may have received from other than [facility] providers in the past year (date may be approximate).     Assessment:   This is a routine wellness examination for Ece.  Hearing/Vision screen Hearing Screening - Comments:: No aids Vision Screening - Comments:: Glasses - Unknown Provider  Dietary issues and exercise activities discussed: Current Exercise Habits: Home exercise routine, Time (Minutes): 20, Frequency (Times/Week): 2, Weekly Exercise (  Minutes/Week): 40, Intensity: Mild, Exercise limited by: None identified   Goals Addressed             This Visit's Progress    Patient Stated       Lose weight.       Depression Screen    04/21/2023   10:14 AM 02/03/2023   10:08 AM 11/16/2022    8:36 AM 10/09/2022    3:22 PM 04/17/2022    9:18 AM 02/20/2022   11:59 AM 07/22/2021    8:28 AM  PHQ 2/9 Scores  PHQ - 2 Score 0 0 0 0 0 0 4  PHQ- 9 Score  0 4    15    Fall Risk    04/21/2023   10:15 AM 02/03/2023    9:05 AM 10/09/2022    3:22 PM 04/17/2022    9:11 AM 02/20/2022   11:59 AM  Fall Risk   Falls in the past year? 0 0 0 0 0  Number falls in past yr: 0 0  0 0  Injury with Fall? 0 0  0 0  Risk for fall due to : No Fall Risks No Fall Risks No Fall Risks Orthopedic patient   Follow up Falls prevention discussed;Falls evaluation completed;Education provided Falls evaluation completed Falls evaluation completed Falls prevention discussed;Education provided Falls evaluation completed    FALL RISK PREVENTION PERTAINING TO THE HOME:  Any stairs in or around the home? No  If so, are there any without handrails? No  Home free of loose throw rugs in walkways, pet beds, electrical cords, etc? Yes  Adequate lighting in your home to reduce risk of falls? Yes   ASSISTIVE  DEVICES UTILIZED TO PREVENT FALLS:  Life alert? No  Use of a cane, walker or w/c? No  Grab bars in the bathroom? No  Shower chair or bench in shower? Yes  Elevated toilet seat or a handicapped toilet? Yes    Cognitive Function:        04/21/2023   10:16 AM 04/17/2022    9:21 AM  6CIT Screen  What Year? 0 points 4 points  What month? 0 points 3 points  What time? 0 points 0 points  Count back from 20 4 points 4 points  Months in reverse 0 points 4 points  Repeat phrase 8 points 10 points  Total Score 12 points 25 points    Immunizations Immunization History  Administered Date(s) Administered   Influenza Inj Mdck Quad Pf 11/01/2019   Influenza,inj,Quad PF,6+ Mos 01/02/2015, 09/13/2016, 09/20/2018, 12/11/2020, 12/19/2021, 11/16/2022   PFIZER(Purple Top)SARS-COV-2 Vaccination 03/21/2020, 04/12/2020, 12/11/2020   Tdap 01/02/2015    TDAP status: Up to date  Flu Vaccine status: Up to date  Pneumococcal vaccine status: Up to date  Covid-19 vaccine status: Declined, Education has been provided regarding the importance of this vaccine but patient still declined. Advised may receive this vaccine at local pharmacy or Health Dept.or vaccine clinic. Aware to provide a copy of the vaccination record if obtained from local pharmacy or Health Dept. Verbalized acceptance and understanding.  Qualifies for Shingles Vaccine? Yes   Zostavax completed  unknown   Shingrix Completed?: Yes  Screening Tests Health Maintenance  Topic Date Due   HIV Screening  Never done   Hepatitis C Screening  Never done   COLONOSCOPY (Pts 45-51yrs Insurance coverage will need to be confirmed)  Never done   COVID-19 Vaccine (4 - 2023-24 season) 04/21/2023 (Originally 08/14/2022)   INFLUENZA VACCINE  07/15/2023  Medicare Annual Wellness (AWV)  04/20/2024   PAP SMEAR-Modifier  12/03/2024   DTaP/Tdap/Td (2 - Td or Tdap) 01/02/2025   HPV VACCINES  Aged Out    Health Maintenance  Health Maintenance Due   Topic Date Due   HIV Screening  Never done   Hepatitis C Screening  Never done   COLONOSCOPY (Pts 45-68yrs Insurance coverage will need to be confirmed)  Never done    Colorectal cancer screening: Referral to GI placed 04/21/23. Pt aware the office will call re: appt.  Mammogram status: Completed 05/26/2022. Repeat every year order placed.   Lung Cancer Screening: (Low Dose CT Chest recommended if Age 34-80 years, 30 pack-year currently smoking OR have quit w/in 15years.) does not qualify.   Lung Cancer Screening Referral: no  Additional Screening:  Hepatitis C Screening: does not qualify; Completed no  Vision Screening: Recommended annual ophthalmology exams for early detection of glaucoma and other disorders of the eye. Is the patient up to date with their annual eye exam?  Yes  Who is the provider or what is the name of the office in which the patient attends annual eye exams? Unknown provider If pt is not established with a provider, would they like to be referred to a provider to establish care? No .   Dental Screening: Recommended annual dental exams for proper oral hygiene  Community Resource Referral / Chronic Care Management: CRR required this visit?  No   CCM required this visit?  No      Plan:     I have personally reviewed and noted the following in the patient's chart:   Medical and social history Use of alcohol, tobacco or illicit drugs  Current medications and supplements including opioid prescriptions. Patient is not currently taking opioid prescriptions. Functional ability and status Nutritional status Physical activity Advanced directives List of other physicians Hospitalizations, surgeries, and ER visits in previous 12 months Vitals Screenings to include cognitive, depression, and falls Referrals and appointments  In addition, I have reviewed and discussed with patient certain preventive protocols, quality metrics, and best practice  recommendations. A written personalized care plan for preventive services as well as general preventive health recommendations were provided to patient.     Maryan Puls, LPN   06/21/2955   Nurse Notes: Order placed for mammogram, and colonoscopy. 6CIT score 12.

## 2023-04-22 ENCOUNTER — Ambulatory Visit: Payer: Medicare PPO | Admitting: Nurse Practitioner

## 2023-04-23 ENCOUNTER — Telehealth: Payer: Self-pay

## 2023-04-23 ENCOUNTER — Other Ambulatory Visit: Payer: Self-pay

## 2023-04-23 DIAGNOSIS — Z1211 Encounter for screening for malignant neoplasm of colon: Secondary | ICD-10-CM

## 2023-04-23 MED ORDER — NA SULFATE-K SULFATE-MG SULF 17.5-3.13-1.6 GM/177ML PO SOLN: 1.0000 | Freq: Once | ORAL | 0 refills | Status: AC

## 2023-04-23 NOTE — Telephone Encounter (Signed)
Gastroenterology Pre-Procedure Review  Request Date: 05/18/23 Requesting Physician: Dr. Allegra Lai  PATIENT REVIEW QUESTIONS: The patient responded to the following health history questions as indicated:    1. Are you having any GI issues? no 2. Do you have a personal history of Polyps? no 3. Do you have a family history of Colon Cancer or Polyps? no 4. Diabetes Mellitus? no 5. Joint replacements in the past 12 months?no 6. Major health problems in the past 3 months?no 7. Any artificial heart valves, MVP, or defibrillator?no    MEDICATIONS & ALLERGIES:    Patient reports the following regarding taking any anticoagulation/antiplatelet therapy:   Plavix, Coumadin, Eliquis, Xarelto, Lovenox, Pradaxa, Brilinta, or Effient? no Aspirin? no  Patient confirms/reports the following medications:  Current Outpatient Medications  Medication Sig Dispense Refill   AIMOVIG 140 MG/ML SOAJ  (Patient not taking: Reported on 04/05/2023)     albuterol (VENTOLIN HFA) 108 (90 Base) MCG/ACT inhaler INHALE 1-2 PUFFS BY MOUTH into THE lungs EVERY 6 HOURS AS NEEDED FOR WHEEZING AND/OR SHORTNESS OF BREATH 18 g 0   amLODipine (NORVASC) 10 MG tablet Take 1 tablet (10 mg total) by mouth daily. 90 tablet 1   fluticasone (FLONASE) 50 MCG/ACT nasal spray INSTILL 1 SPRAY IN EACH NOSTRIL ONCE DAILY 16 g 0   gabapentin (NEURONTIN) 400 MG capsule Take 400 mg by mouth at bedtime.     levocetirizine (XYZAL) 5 MG tablet TAKE ONE TABLET BY MOUTH ONCE daily AS NEEDED (Patient not taking: Reported on 04/05/2023) 90 tablet 2   losartan (COZAAR) 25 MG tablet TAKE ONE TABLET BY MOUTH ONCE DAILY (Patient not taking: Reported on 04/21/2023) 90 tablet 1   montelukast (SINGULAIR) 10 MG tablet TAKE ONE TABLET BY MOUTH ONCE DAILY FOR sinuses 90 tablet 3   Multiple Vitamin (MULTIVITAMIN) capsule Take 1 capsule by mouth daily.     omeprazole (PRILOSEC) 20 MG capsule Take 1 capsule (20 mg total) by mouth daily. 90 capsule 0   venlafaxine  (EFFEXOR) 75 MG tablet Take 75 mg by mouth at bedtime.     No current facility-administered medications for this visit.    Patient confirms/reports the following allergies:  Allergies  Allergen Reactions   Bee Venom Itching   Nortriptyline Other (See Comments)    GI distress   Latex Rash    The powder in the gloves causes rash    No orders of the defined types were placed in this encounter.   AUTHORIZATION INFORMATION Primary Insurance: 1D#: Group #:  Secondary Insurance: 1D#: Group #:  SCHEDULE INFORMATION: Date: 05/18/23 Time: Location: [facility]

## 2023-04-28 ENCOUNTER — Encounter: Payer: Self-pay | Admitting: Family Medicine

## 2023-04-28 ENCOUNTER — Telehealth: Payer: Self-pay | Admitting: Nurse Practitioner

## 2023-04-28 ENCOUNTER — Ambulatory Visit (INDEPENDENT_AMBULATORY_CARE_PROVIDER_SITE_OTHER): Payer: Medicare PPO | Admitting: Family Medicine

## 2023-04-28 VITALS — BP 100/80 | HR 93 | Temp 98.1°F | Ht 69.0 in | Wt 276.2 lb

## 2023-04-28 DIAGNOSIS — A09 Infectious gastroenteritis and colitis, unspecified: Secondary | ICD-10-CM | POA: Diagnosis not present

## 2023-04-28 DIAGNOSIS — R3915 Urgency of urination: Secondary | ICD-10-CM

## 2023-04-28 DIAGNOSIS — E860 Dehydration: Secondary | ICD-10-CM

## 2023-04-28 LAB — POC URINALSYSI DIPSTICK (AUTOMATED)
Bilirubin, UA: NEGATIVE
Blood, UA: NEGATIVE
Glucose, UA: NEGATIVE
Ketones, UA: NEGATIVE
Leukocytes, UA: NEGATIVE
Nitrite, UA: NEGATIVE
Protein, UA: NEGATIVE
Spec Grav, UA: >=1.03 — AB (ref 1.010–1.025)
Urobilinogen, UA: 0.2 E.U./dL
pH, UA: 6 (ref 5.0–8.0)

## 2023-04-28 NOTE — Telephone Encounter (Signed)
Access nurse note.  Pt scheduled for an appointment today with Dr. Patsy Lager.

## 2023-04-28 NOTE — Progress Notes (Signed)
Mistina Coatney T. Venise Ellingwood, MD, CAQ Sports Medicine [facility] at [facility] [address] [place], [address]  Phone: [phone]  FAX: [phone]  Kathy Gamble - 47 y.o. female  MRN [id number]  Date of Birth: 1976-02-14  Date: 04/28/2023  PCP: Eden Emms, NP  Referral: Eden Emms, NP  Chief Complaint  Patient presents with   Diarrhea   Subjective:   Kathy Gamble is a 47 y.o. very pleasant female patient with Body mass index is 40.79 kg/m. who presents with the following:  Diarrhea: Mother initial complaint was diarrhea, on secondary questioning he really is more some urgency and dysuria with concern about UTI.  Yesterday, she spoke to one of the nurses.   Some loose stools - for a couple days. Going about - has to force it out.  She reports that she has had to force out her bowel movement.  No real pain with - sometimes she does have pain on secondary questioning.  Has some urgency.  Hypogastric tenderness.    Review of Systems is noted in the HPI, as appropriate  Objective:   BP 100/80 (BP Location: Left Arm, Patient Position: Sitting, Cuff Size: Large)   Pulse 93   Temp 98.1 F (36.7 C) (Temporal)   Ht 5\' 9"  (1.753 m)   Wt 276 lb 4 oz (125.3 kg)   LMP  (LMP Unknown)   SpO2 97%   BMI 40.79 kg/m   GEN: No acute distress; alert,appropriate. PULM: Breathing comfortably in no respiratory distress PSYCH: Normally interactive.  ABD: S, mild hypogastric tenderness, ND, + BS, No rebound, No HSM   Laboratory and Imaging Data: Results for orders placed or performed in visit on 04/28/23  POCT Urinalysis Dipstick (Automated)  Result Value Ref Range   Color, UA Yellow    Clarity, UA Clear    Glucose, UA Negative Negative   Bilirubin, UA Negative    Ketones, UA Negative    Spec Grav, UA >=1.030 (A) 1.010 - 1.025   Blood, UA Negative    pH, UA 6.0 5.0 - 8.0   Protein, UA Negative Negative   Urobilinogen, UA 0.2 0.2 or 1.0 E.U./dL    Nitrite, UA Negative    Leukocytes, UA Negative Negative     Assessment and Plan:     ICD-10-CM   1. Diarrhea of infectious origin  A09     2. Urinary urgency  R39.15 POCT Urinalysis Dipstick (Automated)    Urine Culture    3. Dehydration  E86.0      My suspicion is her hypogastric abdominal discomfort and loose stools then from a gastrointestinal virus.  UA looks clear, but I am going to send this for culture anyway given her urgency and occasional dysuria.  She does have signs of dehydration on her UA, as well.  She is going to push fluids.  Medication Management during today's office visit: No orders of the defined types were placed in this encounter.  Medications Discontinued During This Encounter  Medication Reason   AIMOVIG 140 MG/ML SOAJ Cost of medication    Orders placed today for conditions managed today: Orders Placed This Encounter  Procedures   Urine Culture   POCT Urinalysis Dipstick (Automated)    Disposition: No follow-ups on file.  Dragon [employer] One speech-to-text software was used for transcription in this dictation.  Possible transcriptional errors can occur using Animal nutritionist.   Signed,  Elpidio Galea. Traveon Louro, MD   Outpatient Encounter Medications  as of 04/28/2023  Medication Sig   albuterol (VENTOLIN HFA) 108 (90 Base) MCG/ACT inhaler INHALE 1-2 PUFFS BY MOUTH into THE lungs EVERY 6 HOURS AS NEEDED FOR WHEEZING AND/OR SHORTNESS OF BREATH   amLODipine (NORVASC) 10 MG tablet Take 1 tablet (10 mg total) by mouth daily.   fluticasone (FLONASE) 50 MCG/ACT nasal spray INSTILL 1 SPRAY IN EACH NOSTRIL ONCE DAILY   gabapentin (NEURONTIN) 400 MG capsule Take 400 mg by mouth at bedtime.   levocetirizine (XYZAL) 5 MG tablet TAKE ONE TABLET BY MOUTH ONCE daily AS NEEDED   losartan (COZAAR) 25 MG tablet TAKE ONE TABLET BY MOUTH ONCE DAILY   montelukast (SINGULAIR) 10 MG tablet TAKE ONE TABLET BY MOUTH ONCE DAILY FOR sinuses   Multiple Vitamin  (MULTIVITAMIN) capsule Take 1 capsule by mouth daily.   omeprazole (PRILOSEC) 20 MG capsule Take 1 capsule (20 mg total) by mouth daily.   venlafaxine (EFFEXOR) 75 MG tablet Take 75 mg by mouth at bedtime.   [DISCONTINUED] AIMOVIG 140 MG/ML SOAJ    No facility-administered encounter medications on file as of 04/28/2023.

## 2023-04-28 NOTE — Telephone Encounter (Signed)
Noted. I see she has an appointment with Dr. Patsy Lager today. I appreciate him evaluating her

## 2023-04-28 NOTE — Telephone Encounter (Signed)
Patient called in stating that she has had diarrhea with dizziness and nausea for the last week.Due to her symptoms,I sent her to access nurse to be triaged.

## 2023-04-29 LAB — URINE CULTURE
MICRO NUMBER:: 14960048
SPECIMEN QUALITY:: ADEQUATE

## 2023-05-04 ENCOUNTER — Encounter: Payer: Self-pay | Admitting: Primary Care

## 2023-05-04 ENCOUNTER — Ambulatory Visit (INDEPENDENT_AMBULATORY_CARE_PROVIDER_SITE_OTHER): Payer: Medicare PPO | Admitting: Primary Care

## 2023-05-04 VITALS — BP 128/84 | HR 80 | Temp 96.8°F | Ht 69.0 in | Wt 279.0 lb

## 2023-05-04 DIAGNOSIS — M542 Cervicalgia: Secondary | ICD-10-CM | POA: Diagnosis not present

## 2023-05-04 DIAGNOSIS — M549 Dorsalgia, unspecified: Secondary | ICD-10-CM

## 2023-05-04 DIAGNOSIS — G8929 Other chronic pain: Secondary | ICD-10-CM | POA: Diagnosis not present

## 2023-05-04 MED ORDER — TIZANIDINE HCL 4 MG PO TABS
4.0000 mg | ORAL_TABLET | Freq: Every evening | ORAL | 0 refills | Status: AC | PRN
Start: 2023-05-04 — End: ?

## 2023-05-04 NOTE — Progress Notes (Signed)
Subjective:    Patient ID: Kathy Gamble, female    DOB: 13-Sep-1976, 47 y.o.   MRN: [id number]  Back Pain Associated symptoms include numbness.    JENNEL ZABORSKI is a very pleasant 47 y.o. female patient of Audria Nine, NP with a history of hypertension, migraines, cognitive dysfunction, neck pain, cervical DDD, obesity, who presents today to discuss back pain.   Symptom onset "a few months ago" with lower cervical upper thoracic back pain. Her pain is intermittent, doesn't occur everyday, comes with rest mostly. She feels her pain today for which she describes as a tightness or pressure.   She's been exercising by running/walking and doing push ups, isn't sure if this has exacerbated her symptoms. She began walking/running a few months ago.   She underwent MR cervical spine in May 2023 which revealed mild degenerative changes to C3-4 and C5-6 with mild canal and foraminal narrowing without neural compression. She has known tendonitis to her hands.  She denies numbness from the shoulders radiating down to her arms, recent injury or trauma.    Review of Systems  Musculoskeletal:  Positive for back pain, myalgias and neck pain.  Skin:  Negative for color change.  Neurological:  Positive for numbness.         Past Medical History:  Diagnosis Date   Allergy    Bee sting allergy 03/02/2015   Chicken pox    Frequent headaches    GERD (gastroesophageal reflux disease)     Social History   Socioeconomic History   Marital status: Married    Spouse name: Not on file   Number of children: Not on file   Years of education: Not on file   Highest education level: Not on file  Occupational History   Occupation: disabled  Tobacco Use   Smoking status: Never   Smokeless tobacco: Never  Vaping Use   Vaping Use: Never used  Substance and Sexual Activity   Alcohol use: No    Alcohol/week: 0.0 standard drinks of alcohol   Drug use: Not Currently    Types: Cocaine   Sexual activity:  Yes    Birth control/protection: None  Other Topics Concern   Not on file  Social History Narrative   Cognitive dysfunction - lives with her husband   Social Determinants of Health   Financial Resource Strain: Low Risk  (04/21/2023)   Overall Financial Resource Strain (CARDIA)    Difficulty of Paying Living Expenses: Not hard at all  Food Insecurity: No Food Insecurity (04/21/2023)   Hunger Vital Sign    Worried About Running Out of Food in the Last Year: Never true    Ran Out of Food in the Last Year: Never true  Transportation Needs: No Transportation Needs (04/21/2023)   PRAPARE - Administrator, Civil Service (Medical): No    Lack of Transportation (Non-Medical): No  Physical Activity: Insufficiently Active (04/21/2023)   Exercise Vital Sign    Days of Exercise per Week: 2 days    Minutes of Exercise per Session: 20 min  Stress: No Stress Concern Present (04/21/2023)   [employer] of Occupational Health - Occupational Stress Questionnaire    Feeling of Stress : Not at all  Social Connections: Moderately Integrated (04/21/2023)   Social Connection and Isolation Panel [NHANES]    Frequency of Communication with Friends and Family: More than three times a week    Frequency of Social Gatherings with Friends and Family: More than three times  a week    Attends Religious Services: More than 4 times per year    Active Member of Clubs or Organizations: No    Attends Banker Meetings: Never    Marital Status: Married  Catering manager Violence: Not At Risk (04/21/2023)   Humiliation, Afraid, Rape, and Kick questionnaire    Fear of Current or Ex-Partner: No    Emotionally Abused: No    Physically Abused: No    Sexually Abused: No    Past Surgical History:  Procedure Laterality Date   ADENOIDECTOMY     TONSILLECTOMY      Family History  Problem Relation Age of Onset   Stroke Mother    Mental retardation Mother    Cancer Maternal Grandmother    Cancer  Paternal Grandmother        Breast   Heart disease Paternal Grandfather     Allergies  Allergen Reactions   Bee Venom Itching   Nortriptyline Other (See Comments)    GI distress   Latex Rash    The powder in the gloves causes rash    Current Outpatient Medications on File Prior to Visit  Medication Sig Dispense Refill   albuterol (VENTOLIN HFA) 108 (90 Base) MCG/ACT inhaler INHALE 1-2 PUFFS BY MOUTH into THE lungs EVERY 6 HOURS AS NEEDED FOR WHEEZING AND/OR SHORTNESS OF BREATH 18 g 0   amLODipine (NORVASC) 10 MG tablet Take 1 tablet (10 mg total) by mouth daily. 90 tablet 1   fluticasone (FLONASE) 50 MCG/ACT nasal spray INSTILL 1 SPRAY IN EACH NOSTRIL ONCE DAILY 16 g 0   gabapentin (NEURONTIN) 400 MG capsule Take 400 mg by mouth at bedtime.     levocetirizine (XYZAL) 5 MG tablet TAKE ONE TABLET BY MOUTH ONCE daily AS NEEDED 90 tablet 2   losartan (COZAAR) 25 MG tablet TAKE ONE TABLET BY MOUTH ONCE DAILY 90 tablet 1   montelukast (SINGULAIR) 10 MG tablet TAKE ONE TABLET BY MOUTH ONCE DAILY FOR sinuses 90 tablet 3   Multiple Vitamin (MULTIVITAMIN) capsule Take 1 capsule by mouth daily.     omeprazole (PRILOSEC) 20 MG capsule Take 1 capsule (20 mg total) by mouth daily. 90 capsule 0   venlafaxine (EFFEXOR) 75 MG tablet Take 75 mg by mouth at bedtime.     No current facility-administered medications on file prior to visit.    BP 128/84   Pulse 80   Temp (!) 96.8 F (36 C) (Temporal)   Ht 5\' 9"  (1.753 m)   Wt 279 lb (126.6 kg)   LMP  (LMP Unknown)   SpO2 99%   BMI 41.20 kg/m  Objective:   Physical Exam Cardiovascular:     Rate and Rhythm: Normal rate.  Pulmonary:     Effort: Pulmonary effort is normal.  Musculoskeletal:     Cervical back: Tenderness present. No bony tenderness. Normal range of motion.     Thoracic back: Tenderness present. No bony tenderness. Normal range of motion.       Back:  Neurological:     Mental Status: She is alert.           Assessment  & Plan:  Chronic neck and back pain Assessment & Plan: Poor historian but no alarm signs. Reviewed MRI cervical spine from May 2023 which reveals mild DDD. Do suspect current symptoms could be related, especially as there is no trauma.   Offered xray of thoracic spine for which she declines.  Referral placed for physical therapy. Start  tizanidine 4 mg HS for muscle tightness. Drowsiness precautions provided.   Follow up with PCP if needed.  Orders: -     Ambulatory referral to Physical Therapy -     tiZANidine HCl; Take 1 tablet (4 mg total) by mouth at bedtime as needed for muscle spasms.  Dispense: 14 tablet; Refill: 0        Doreene Nest, NP

## 2023-05-04 NOTE — Patient Instructions (Signed)
You will either be contacted via phone regarding your referral to physical therapy, or you may receive a letter on your MyChart portal from our referral team with instructions for scheduling an appointment. Please let us know if you have not been contacted by anyone within two weeks.  You may take the Tizanidine muscle relaxer pill at bedtime as needed for muscle tightness/spasms.   It was a pleasure to see you today!

## 2023-05-04 NOTE — Assessment & Plan Note (Signed)
Poor historian but no alarm signs. Reviewed MRI cervical spine from May 2023 which reveals mild DDD. Do suspect current symptoms could be related, especially as there is no trauma.   Offered xray of thoracic spine for which she declines.  Referral placed for physical therapy. Start tizanidine 4 mg HS for muscle tightness. Drowsiness precautions provided.   Follow up with PCP if needed.

## 2023-05-17 ENCOUNTER — Encounter: Payer: Self-pay | Admitting: Gastroenterology

## 2023-05-18 ENCOUNTER — Encounter: Admission: RE | Payer: Self-pay | Source: Home / Self Care

## 2023-05-18 ENCOUNTER — Ambulatory Visit: Admission: RE | Admit: 2023-05-18 | Payer: Medicare PPO | Source: Home / Self Care | Admitting: Gastroenterology

## 2023-05-18 ENCOUNTER — Telehealth: Payer: Self-pay

## 2023-05-18 SURGERY — COLONOSCOPY WITH PROPOFOL
Anesthesia: General

## 2023-05-18 NOTE — Telephone Encounter (Signed)
Patient has been contacted this morning to reschedule her colonoscopy that was scheduled for this morning.  Unfortunately her husband was hospitalized yesterday with Gangrene.  She apologizes for not being able to make it to her procedure this morning.  I told her we understand and she can call our office to reschedule once her husband's health improves.  No cancellation fee due to unforseen family emergency.  Thanks, [employer], [place]

## 2023-05-19 ENCOUNTER — Other Ambulatory Visit: Payer: Self-pay | Admitting: Nurse Practitioner

## 2023-05-19 DIAGNOSIS — I10 Essential (primary) hypertension: Secondary | ICD-10-CM

## 2023-06-03 ENCOUNTER — Other Ambulatory Visit: Payer: Self-pay | Admitting: Nurse Practitioner

## 2023-06-03 DIAGNOSIS — J302 Other seasonal allergic rhinitis: Secondary | ICD-10-CM

## 2023-06-09 DIAGNOSIS — R519 Headache, unspecified: Secondary | ICD-10-CM | POA: Diagnosis not present

## 2023-06-09 DIAGNOSIS — M542 Cervicalgia: Secondary | ICD-10-CM | POA: Diagnosis not present

## 2023-06-09 DIAGNOSIS — F411 Generalized anxiety disorder: Secondary | ICD-10-CM | POA: Diagnosis not present

## 2023-06-09 DIAGNOSIS — R937 Abnormal findings on diagnostic imaging of other parts of musculoskeletal system: Secondary | ICD-10-CM | POA: Diagnosis not present

## 2023-06-09 DIAGNOSIS — G479 Sleep disorder, unspecified: Secondary | ICD-10-CM | POA: Diagnosis not present

## 2023-06-11 ENCOUNTER — Encounter: Payer: Self-pay | Admitting: Family Medicine

## 2023-06-11 ENCOUNTER — Telehealth: Payer: Self-pay

## 2023-06-11 ENCOUNTER — Ambulatory Visit (INDEPENDENT_AMBULATORY_CARE_PROVIDER_SITE_OTHER): Payer: Medicare PPO | Admitting: Family Medicine

## 2023-06-11 VITALS — BP 104/78 | HR 84 | Temp 98.6°F | Ht 69.0 in | Wt 276.5 lb

## 2023-06-11 DIAGNOSIS — S99922A Unspecified injury of left foot, initial encounter: Secondary | ICD-10-CM | POA: Diagnosis not present

## 2023-06-11 NOTE — Patient Instructions (Signed)
Wash Injured area on toe daily with warm soapy water, and soak in Epsom salt..  Elevate foot when sitting.  Keep area clean and dry, apply topical antibiotic ointment and bandage to keep area clean of debris and germs. Call if symptoms not improving over time.

## 2023-06-11 NOTE — Telephone Encounter (Signed)
Noted  

## 2023-06-11 NOTE — Progress Notes (Signed)
Patient ID: Kathy Gamble, female    DOB: 1976/06/11, 47 y.o.   MRN: [id number]  This visit was conducted in person.  BP 104/78 (BP Location: Left Arm, Patient Position: Sitting, Cuff Size: Large)   Pulse 84   Temp 98.6 F (37 C) (Temporal)   Ht 5\' 9"  (1.753 m)   Wt 276 lb 8 oz (125.4 kg)   LMP  (LMP Unknown)   SpO2 96%   BMI 40.83 kg/m    CC:  Chief Complaint  Patient presents with   Toe Injury    Patient hit foot on piece of wood under the bed.  Went for a walk and it stated oozing and bleeding Left Big Toe-EMS was called.  According to patient,  nothing was done by EMS    Subjective:   HPI: Kathy Gamble is a 47 y.o. female presenting on 06/11/2023 for Toe Injury (Patient hit foot on piece of wood under the bed.  Went for a walk and it stated oozing and bleeding Left Big Toe-EMS was called.  According to patient,  nothing was done by EMS)  No history of diabetes  Injury to left foot after hitting a piece of wood under the bed. 4 days ago  Stubbed great toe and hit nail infected with fungus... noted bleeding under nail. She reports she called EMS for this injury on  6/25 given it started oozing when she walked to the mailbox..  By then the bleeding had stopped. No treatment rendered.   She has been icing it.. washing area.  Applying antibiotic oitnment.   No fever.       Relevant past medical, surgical, family and social history reviewed and updated as indicated. Interim medical history since our last visit reviewed. Allergies and medications reviewed and updated. Outpatient Medications Prior to Visit  Medication Sig Dispense Refill   albuterol (VENTOLIN HFA) 108 (90 Base) MCG/ACT inhaler Inhale 1-2 puffs into the lungs every 6 hours as needed for wheezing and or shortness of breah. 8.5 g 0   amLODipine (NORVASC) 10 MG tablet Take 1 tablet (10 mg total) by mouth daily. 90 tablet 1   fluticasone (FLONASE) 50 MCG/ACT nasal spray INSTILL 1 SPRAY IN EACH NOSTRIL ONCE  DAILY 16 g 0   gabapentin (NEURONTIN) 400 MG capsule Take 400 mg by mouth at bedtime.     levocetirizine (XYZAL) 5 MG tablet TAKE ONE TABLET BY MOUTH ONCE daily AS NEEDED 90 tablet 2   losartan (COZAAR) 25 MG tablet TAKE ONE TABLET BY MOUTH ONCE DAILY 90 tablet 1   montelukast (SINGULAIR) 10 MG tablet TAKE ONE TABLET BY MOUTH ONCE DAILY FOR sinuses 90 tablet 3   Multiple Vitamin (MULTIVITAMIN) capsule Take 1 capsule by mouth daily.     omeprazole (PRILOSEC) 20 MG capsule Take 1 capsule (20 mg total) by mouth daily. 90 capsule 0   tiZANidine (ZANAFLEX) 4 MG tablet Take 1 tablet (4 mg total) by mouth at bedtime as needed for muscle spasms. 14 tablet 0   venlafaxine (EFFEXOR) 75 MG tablet Take 75 mg by mouth at bedtime.     zonisamide (ZONEGRAN) 25 MG capsule Take 25 mg by mouth at bedtime.     No facility-administered medications prior to visit.     Per HPI unless specifically indicated in ROS section below Review of Systems  Constitutional:  Negative for fatigue and fever.  HENT:  Negative for ear pain.   Eyes:  Negative for pain.  Respiratory:  Negative for chest tightness and shortness of breath.   Cardiovascular:  Negative for chest pain, palpitations and leg swelling.  Gastrointestinal:  Negative for abdominal pain.  Genitourinary:  Negative for dysuria.   Objective:  BP 104/78 (BP Location: Left Arm, Patient Position: Sitting, Cuff Size: Large)   Pulse 84   Temp 98.6 F (37 C) (Temporal)   Ht 5\' 9"  (1.753 m)   Wt 276 lb 8 oz (125.4 kg)   LMP  (LMP Unknown)   SpO2 96%   BMI 40.83 kg/m   Wt Readings from Last 3 Encounters:  06/11/23 276 lb 8 oz (125.4 kg)  05/04/23 279 lb (126.6 kg)  04/28/23 276 lb 4 oz (125.3 kg)      Physical Exam Constitutional:      General: She is not in acute distress.    Appearance: Normal appearance. She is well-developed. She is not ill-appearing or toxic-appearing.  HENT:     Head: Normocephalic.     Right Ear: Hearing, tympanic membrane,  ear canal and external ear normal. Tympanic membrane is not erythematous, retracted or bulging.     Left Ear: Hearing, tympanic membrane, ear canal and external ear normal. Tympanic membrane is not erythematous, retracted or bulging.     Nose: No mucosal edema or rhinorrhea.     Right Sinus: No maxillary sinus tenderness or frontal sinus tenderness.     Left Sinus: No maxillary sinus tenderness or frontal sinus tenderness.     Mouth/Throat:     Pharynx: Uvula midline.  Eyes:     General: Lids are normal. Lids are everted, no foreign bodies appreciated.     Conjunctiva/sclera: Conjunctivae normal.     Pupils: Pupils are equal, round, and reactive to light.  Neck:     Thyroid: No thyroid mass or thyromegaly.     Vascular: No carotid bruit.     Trachea: Trachea normal.  Cardiovascular:     Rate and Rhythm: Normal rate and regular rhythm.     Pulses: Normal pulses.     Heart sounds: Normal heart sounds, S1 normal and S2 normal. No murmur heard.    No friction rub. No gallop.  Pulmonary:     Effort: Pulmonary effort is normal. No tachypnea or respiratory distress.     Breath sounds: Normal breath sounds. No decreased breath sounds, wheezing, rhonchi or rales.  Abdominal:     General: Bowel sounds are normal.     Palpations: Abdomen is soft.     Tenderness: There is no abdominal tenderness.  Musculoskeletal:     Cervical back: Normal range of motion and neck supple.  Skin:    General: Skin is warm and dry.     Findings: No rash.  Neurological:     Mental Status: She is alert.  Psychiatric:        Mood and Affect: Mood is not anxious or depressed.        Speech: Speech normal.        Behavior: Behavior normal. Behavior is cooperative.        Thought Content: Thought content normal.        Judgment: Judgment normal.       Results for orders placed or performed in visit on 04/28/23  Urine Culture   Specimen: Urine  Result Value Ref Range   MICRO NUMBER: [id number]    SPECIMEN  QUALITY: Adequate    Sample Source URINE    STATUS: FINAL    Result:  Less than 10,000 CFU/mL of single Gram negative organism isolated. No further testing will be performed. If clinically indicated, recollection using a method to minimize contamination, with prompt transfer to Urine Culture Transport Tube, is recommended.  POCT Urinalysis Dipstick (Automated)  Result Value Ref Range   Color, UA Yellow    Clarity, UA Clear    Glucose, UA Negative Negative   Bilirubin, UA Negative    Ketones, UA Negative    Spec Grav, UA >=1.030 (A) 1.010 - 1.025   Blood, UA Negative    pH, UA 6.0 5.0 - 8.0   Protein, UA Negative Negative   Urobilinogen, UA 0.2 0.2 or 1.0 E.U./dL   Nitrite, UA Negative    Leukocytes, UA Negative Negative    Assessment and Plan  Injury of left great toe, initial encounter Assessment & Plan: Acute, no evidence of fracture noted.  No indication for x-ray. She will likely lose the toenail given concurrent fungal infection of the nail. Wash Injured area on toe daily with warm soapy water, and soak in Epsom salt..  Elevate foot when sitting.  Keep area clean and dry, apply topical antibiotic ointment and bandage to keep area clean of debris and germs. Call if symptoms not improving over time.     Return if symptoms worsen or fail to improve.   Kerby Nora, MD

## 2023-06-11 NOTE — Assessment & Plan Note (Addendum)
Acute, no evidence of fracture noted.  No indication for x-ray. She will likely lose the toenail given concurrent fungal infection of the nail. Wash Injured area on toe daily with warm soapy water, and soak in Epsom salt..  Elevate foot when sitting.  Keep area clean and dry, apply topical antibiotic ointment and bandage to keep area clean of debris and germs. Call if symptoms not improving over time.

## 2023-06-11 NOTE — Telephone Encounter (Signed)
Pt said on 06/10/23 at 6 pm pt stumped her lt big toe on piece of wood and had cut between lt big toe and 2nd toe.pt called ambulance but bleeding stopped and pt does not think needs sutures but wanted [facility] be aware and ? If pt needs tetanus shot.  Per immunization record pt had tdap on 01/02/2015.pt scheduled appt with Dr Ermalene Searing 06/11/23 at 3:20.sending note to Dr Ermalene Searing and [facility].

## 2023-06-15 ENCOUNTER — Ambulatory Visit: Payer: Medicare PPO | Admitting: Family Medicine

## 2023-06-16 ENCOUNTER — Encounter: Payer: Self-pay | Admitting: Family Medicine

## 2023-06-16 ENCOUNTER — Ambulatory Visit (INDEPENDENT_AMBULATORY_CARE_PROVIDER_SITE_OTHER): Payer: Medicare PPO | Admitting: Family Medicine

## 2023-06-16 VITALS — BP 120/82 | HR 78 | Temp 98.5°F | Ht 69.0 in | Wt 283.0 lb

## 2023-06-16 DIAGNOSIS — J0100 Acute maxillary sinusitis, unspecified: Secondary | ICD-10-CM | POA: Diagnosis not present

## 2023-06-16 DIAGNOSIS — S99922D Unspecified injury of left foot, subsequent encounter: Secondary | ICD-10-CM

## 2023-06-16 DIAGNOSIS — J302 Other seasonal allergic rhinitis: Secondary | ICD-10-CM | POA: Diagnosis not present

## 2023-06-16 MED ORDER — ALBUTEROL SULFATE HFA 108 (90 BASE) MCG/ACT IN AERS: INHALATION_SPRAY | RESPIRATORY_TRACT | 0 refills | Status: DC

## 2023-06-16 NOTE — Assessment & Plan Note (Signed)
Acute, toe injury healing well.  No indication for x-ray.  Nail may not fall off as we were concerned.  Encouraged her to continue to keep the area clean and dry.

## 2023-06-16 NOTE — Assessment & Plan Note (Signed)
Acute, no sign of bacterial infection.  Symptoms most likely related to viral infection versus allergic sinusitis. Encouraged her to increase Flonase to 2 sprays per nostril daily.  She was given albuterol inhaler to use as needed although there does not clearly seem to be a ongoing asthma exacerbation with clear lung exam. Continue Singulair and Xyzal.

## 2023-06-16 NOTE — Patient Instructions (Signed)
Continue Xyzal  and continue singulair at bedtime.  Can start Mucinex DM twice daily.  Increase  flonase 2 sprays per nostril daily.   Keep toe clean and dry.. it is healing well.   Call your PCP if not improving through the next week or 2  or if new fever.

## 2023-06-16 NOTE — Progress Notes (Signed)
Patient ID: Kathy Gamble, female    DOB: Sep 07, 1976, 47 y.o.   MRN: [id number]  This visit was conducted in person.  BP 120/82 (BP Location: Left Arm, Patient Position: Sitting, Cuff Size: Large)   Pulse 78   Temp 98.5 F (36.9 C) (Temporal)   Ht 5\' 9"  (1.753 m)   Wt 283 lb (128.4 kg)   LMP  (LMP Unknown)   SpO2 98%   BMI 41.79 kg/m    CC:  Chief Complaint  Patient presents with   Sore Throat    And cough x 1-2 weeks    Toe Injury    Patient stated that she fell about 2 weeks ago and injured toe; already seen for. Toe  is no longer oozing, less red and swollen. Patient states she was told to keep wrapped and has done so until today.  She has also washed and soaked the area.    Subjective:   HPI: Kathy Gamble is a 47 y.o. female presenting on 06/16/2023 for Sore Throat (And cough x 1-2 weeks ) and Toe Injury (Patient stated that she fell about 2 weeks ago and injured toe; already seen for. Toe  is no longer oozing, less red and swollen. Patient states she was told to keep wrapped and has done so until today.  She has also washed and soaked the area.)  1.  2 weeks of sore throat, sinus pain and pressure,  Bilateral ear pain and  productive (clear mucus) cough.  Father sick as well. No recent travel.  No SOB,  occ wheeze.  No fever.  Using  mucinex DM,. OTC cold and flu.   History of seasonal allergies and mild persistent asthma.  On Singulair,  does not have albuterol and antihistamine ( Xyzal)  2.  Stubbed toe 2 weeks ago, fungal infection and nail caused injury to loosen nail with partial removal. Reviewed last office visit June 11, 2023 for similar issue.  Patient was instructed to wash with warm soapy water and change dressing daily.       Relevant past medical, surgical, family and social history reviewed and updated as indicated. Interim medical history since our last visit reviewed. Allergies and medications reviewed and updated. Outpatient Medications Prior to  Visit  Medication Sig Dispense Refill   amLODipine (NORVASC) 10 MG tablet Take 1 tablet (10 mg total) by mouth daily. 90 tablet 1   fluticasone (FLONASE) 50 MCG/ACT nasal spray INSTILL 1 SPRAY IN EACH NOSTRIL ONCE DAILY 16 g 0   gabapentin (NEURONTIN) 400 MG capsule Take 400 mg by mouth at bedtime.     levocetirizine (XYZAL) 5 MG tablet TAKE ONE TABLET BY MOUTH ONCE daily AS NEEDED 90 tablet 2   losartan (COZAAR) 25 MG tablet TAKE ONE TABLET BY MOUTH ONCE DAILY 90 tablet 1   montelukast (SINGULAIR) 10 MG tablet TAKE ONE TABLET BY MOUTH ONCE DAILY FOR sinuses 90 tablet 3   Multiple Vitamin (MULTIVITAMIN) capsule Take 1 capsule by mouth daily.     omeprazole (PRILOSEC) 20 MG capsule Take 1 capsule (20 mg total) by mouth daily. 90 capsule 0   tiZANidine (ZANAFLEX) 4 MG tablet Take 1 tablet (4 mg total) by mouth at bedtime as needed for muscle spasms. 14 tablet 0   venlafaxine (EFFEXOR) 75 MG tablet Take 75 mg by mouth at bedtime.     zonisamide (ZONEGRAN) 25 MG capsule Take 25 mg by mouth at bedtime.     albuterol (VENTOLIN HFA) 108 (  90 Base) MCG/ACT inhaler Inhale 1-2 puffs into the lungs every 6 hours as needed for wheezing and or shortness of breah. 8.5 g 0   No facility-administered medications prior to visit.     Per HPI unless specifically indicated in ROS section below Review of Systems  Constitutional:  Negative for fatigue and fever.  HENT:  Negative for congestion.   Eyes:  Negative for pain.  Respiratory:  Negative for cough and shortness of breath.   Cardiovascular:  Negative for chest pain, palpitations and leg swelling.  Gastrointestinal:  Negative for abdominal pain.  Genitourinary:  Negative for dysuria and vaginal bleeding.  Musculoskeletal:  Negative for back pain.  Neurological:  Negative for syncope, light-headedness and headaches.  Psychiatric/Behavioral:  Negative for dysphoric mood.    Objective:  BP 120/82 (BP Location: Left Arm, Patient Position: Sitting, Cuff  Size: Large)   Pulse 78   Temp 98.5 F (36.9 C) (Temporal)   Ht 5\' 9"  (1.753 m)   Wt 283 lb (128.4 kg)   LMP  (LMP Unknown)   SpO2 98%   BMI 41.79 kg/m   Wt Readings from Last 3 Encounters:  06/16/23 283 lb (128.4 kg)  06/11/23 276 lb 8 oz (125.4 kg)  05/04/23 279 lb (126.6 kg)      Physical Exam Constitutional:      General: She is not in acute distress.    Appearance: Normal appearance. She is well-developed. She is not ill-appearing or toxic-appearing.  HENT:     Head: Normocephalic.     Right Ear: Hearing, ear canal and external ear normal. A middle ear effusion is present. Tympanic membrane is scarred. Tympanic membrane is not erythematous, retracted or bulging.     Left Ear: Hearing, ear canal and external ear normal. A middle ear effusion is present. Tympanic membrane is scarred. Tympanic membrane is not erythematous, retracted or bulging.     Nose: No mucosal edema or rhinorrhea.     Right Turbinates: Not enlarged, swollen or pale.     Left Turbinates: Not enlarged, swollen or pale.     Right Sinus: No maxillary sinus tenderness or frontal sinus tenderness.     Left Sinus: No maxillary sinus tenderness or frontal sinus tenderness.     Mouth/Throat:     Pharynx: Uvula midline.  Eyes:     General: Lids are normal. Lids are everted, no foreign bodies appreciated.     Conjunctiva/sclera: Conjunctivae normal.     Pupils: Pupils are equal, round, and reactive to light.  Neck:     Thyroid: No thyroid mass or thyromegaly.     Vascular: No carotid bruit.     Trachea: Trachea normal.  Cardiovascular:     Rate and Rhythm: Normal rate and regular rhythm.     Pulses: Normal pulses.     Heart sounds: Normal heart sounds, S1 normal and S2 normal. No murmur heard.    No friction rub. No gallop.  Pulmonary:     Effort: Pulmonary effort is normal. No tachypnea or respiratory distress.     Breath sounds: Normal breath sounds. No decreased breath sounds, wheezing, rhonchi or rales.   Abdominal:     General: Bowel sounds are normal.     Palpations: Abdomen is soft.     Tenderness: There is no abdominal tenderness.  Musculoskeletal:     Cervical back: Normal range of motion and neck supple.  Skin:    General: Skin is warm and dry.     Findings: No  rash.  Neurological:     Mental Status: She is alert.  Psychiatric:        Mood and Affect: Mood is not anxious or depressed.        Speech: Speech normal.        Behavior: Behavior normal. Behavior is cooperative.        Thought Content: Thought content normal.        Judgment: Judgment normal.       Results for orders placed or performed in visit on 04/28/23  Urine Culture   Specimen: Urine  Result Value Ref Range   MICRO NUMBER: [id number]    SPECIMEN QUALITY: Adequate    Sample Source URINE    STATUS: FINAL    Result:      Less than 10,000 CFU/mL of single Gram negative organism isolated. No further testing will be performed. If clinically indicated, recollection using a method to minimize contamination, with prompt transfer to Urine Culture Transport Tube, is recommended.  POCT Urinalysis Dipstick (Automated)  Result Value Ref Range   Color, UA Yellow    Clarity, UA Clear    Glucose, UA Negative Negative   Bilirubin, UA Negative    Ketones, UA Negative    Spec Grav, UA >=1.030 (A) 1.010 - 1.025   Blood, UA Negative    pH, UA 6.0 5.0 - 8.0   Protein, UA Negative Negative   Urobilinogen, UA 0.2 0.2 or 1.0 E.U./dL   Nitrite, UA Negative    Leukocytes, UA Negative Negative    Assessment and Plan  Injury of left great toe, subsequent encounter Assessment & Plan: Acute, toe injury healing well.  No indication for x-ray.  Nail may not fall off as we were concerned.  Encouraged her to continue to keep the area clean and dry.   Seasonal allergic rhinitis, unspecified trigger -     Albuterol Sulfate HFA; Inhale 1-2 puffs into the lungs every 6 hours as needed for wheezing and or shortness of breah.   Dispense: 8.5 g; Refill: 0  Acute non-recurrent maxillary sinusitis Assessment & Plan: Acute, no sign of bacterial infection.  Symptoms most likely related to viral infection versus allergic sinusitis. Encouraged her to increase Flonase to 2 sprays per nostril daily.  She was given albuterol inhaler to use as needed although there does not clearly seem to be a ongoing asthma exacerbation with clear lung exam. Continue Singulair and Xyzal.       No follow-ups on file.   Kerby Nora, MD

## 2023-06-19 DIAGNOSIS — G43909 Migraine, unspecified, not intractable, without status migrainosus: Secondary | ICD-10-CM | POA: Diagnosis not present

## 2023-06-19 DIAGNOSIS — M47812 Spondylosis without myelopathy or radiculopathy, cervical region: Secondary | ICD-10-CM | POA: Diagnosis not present

## 2023-06-19 DIAGNOSIS — I10 Essential (primary) hypertension: Secondary | ICD-10-CM | POA: Diagnosis not present

## 2023-06-19 DIAGNOSIS — G35 Multiple sclerosis: Secondary | ICD-10-CM | POA: Diagnosis not present

## 2023-06-19 DIAGNOSIS — F411 Generalized anxiety disorder: Secondary | ICD-10-CM | POA: Diagnosis not present

## 2023-06-19 DIAGNOSIS — G8929 Other chronic pain: Secondary | ICD-10-CM | POA: Diagnosis not present

## 2023-06-19 DIAGNOSIS — Z6841 Body Mass Index (BMI) 40.0 and over, adult: Secondary | ICD-10-CM | POA: Diagnosis not present

## 2023-06-19 DIAGNOSIS — K219 Gastro-esophageal reflux disease without esophagitis: Secondary | ICD-10-CM | POA: Diagnosis not present

## 2023-06-21 DIAGNOSIS — Z6841 Body Mass Index (BMI) 40.0 and over, adult: Secondary | ICD-10-CM | POA: Diagnosis not present

## 2023-06-21 DIAGNOSIS — F411 Generalized anxiety disorder: Secondary | ICD-10-CM | POA: Diagnosis not present

## 2023-06-21 DIAGNOSIS — G35 Multiple sclerosis: Secondary | ICD-10-CM | POA: Diagnosis not present

## 2023-06-21 DIAGNOSIS — G43909 Migraine, unspecified, not intractable, without status migrainosus: Secondary | ICD-10-CM | POA: Diagnosis not present

## 2023-06-21 DIAGNOSIS — I10 Essential (primary) hypertension: Secondary | ICD-10-CM | POA: Diagnosis not present

## 2023-06-21 DIAGNOSIS — G8929 Other chronic pain: Secondary | ICD-10-CM | POA: Diagnosis not present

## 2023-06-21 DIAGNOSIS — K219 Gastro-esophageal reflux disease without esophagitis: Secondary | ICD-10-CM | POA: Diagnosis not present

## 2023-06-21 DIAGNOSIS — M47812 Spondylosis without myelopathy or radiculopathy, cervical region: Secondary | ICD-10-CM | POA: Diagnosis not present

## 2023-06-22 ENCOUNTER — Telehealth: Payer: Self-pay | Admitting: Nurse Practitioner

## 2023-06-22 NOTE — Telephone Encounter (Signed)
Have her apply topical antibiotic ointment and wash area with warm soapy water daily.  She can keep covered with a Band-Aid to avoid rubbing.

## 2023-06-22 NOTE — Telephone Encounter (Signed)
Patient called in and stated that she seen Dr. Ermalene Searing regarding her toe. She stated that she was informed to call in and let her know when her toe nail falls off. She stated that it fell off and she put some cream on it. She stated that she hasn't soaked it since the appointment but it is dry. She was wanting to know what she do, she stated that it is still red and swollen. Please advise. Thank you!

## 2023-06-23 NOTE — Telephone Encounter (Signed)
Lynasia notified as instructed by telephone.  Patient states understanding.

## 2023-06-28 DIAGNOSIS — I10 Essential (primary) hypertension: Secondary | ICD-10-CM | POA: Diagnosis not present

## 2023-06-28 DIAGNOSIS — G43909 Migraine, unspecified, not intractable, without status migrainosus: Secondary | ICD-10-CM | POA: Diagnosis not present

## 2023-06-28 DIAGNOSIS — F411 Generalized anxiety disorder: Secondary | ICD-10-CM | POA: Diagnosis not present

## 2023-06-28 DIAGNOSIS — G8929 Other chronic pain: Secondary | ICD-10-CM | POA: Diagnosis not present

## 2023-06-28 DIAGNOSIS — G35 Multiple sclerosis: Secondary | ICD-10-CM | POA: Diagnosis not present

## 2023-06-28 DIAGNOSIS — M47812 Spondylosis without myelopathy or radiculopathy, cervical region: Secondary | ICD-10-CM | POA: Diagnosis not present

## 2023-06-28 DIAGNOSIS — Z6841 Body Mass Index (BMI) 40.0 and over, adult: Secondary | ICD-10-CM | POA: Diagnosis not present

## 2023-06-28 DIAGNOSIS — K219 Gastro-esophageal reflux disease without esophagitis: Secondary | ICD-10-CM | POA: Diagnosis not present

## 2023-07-01 ENCOUNTER — Telehealth: Payer: Self-pay | Admitting: Nurse Practitioner

## 2023-07-01 LAB — HM DIABETES EYE EXAM

## 2023-07-01 NOTE — Telephone Encounter (Signed)
While patient was visiting the office, she wanted to leave a message with [facility] for his opinion on what she should do. Patient says she has been coughing up mucus for about 2 weeks, wants to know if Susy Frizzle wants her to go see her ENT or if she should make an appointment here to see [facility] or whoever is available? Patient also asked if Susy Frizzle would place her a referral to see a therapist either in [place] or [place].

## 2023-07-01 NOTE — Telephone Encounter (Signed)
Left message to call office. Patient will need to be seen in office to have cough evaluated as well as to get referral.

## 2023-07-02 DIAGNOSIS — G43909 Migraine, unspecified, not intractable, without status migrainosus: Secondary | ICD-10-CM | POA: Diagnosis not present

## 2023-07-02 DIAGNOSIS — Z6841 Body Mass Index (BMI) 40.0 and over, adult: Secondary | ICD-10-CM | POA: Diagnosis not present

## 2023-07-02 DIAGNOSIS — G35 Multiple sclerosis: Secondary | ICD-10-CM | POA: Diagnosis not present

## 2023-07-02 DIAGNOSIS — G8929 Other chronic pain: Secondary | ICD-10-CM | POA: Diagnosis not present

## 2023-07-02 DIAGNOSIS — K219 Gastro-esophageal reflux disease without esophagitis: Secondary | ICD-10-CM | POA: Diagnosis not present

## 2023-07-02 DIAGNOSIS — F411 Generalized anxiety disorder: Secondary | ICD-10-CM | POA: Diagnosis not present

## 2023-07-02 DIAGNOSIS — I10 Essential (primary) hypertension: Secondary | ICD-10-CM | POA: Diagnosis not present

## 2023-07-02 DIAGNOSIS — M47812 Spondylosis without myelopathy or radiculopathy, cervical region: Secondary | ICD-10-CM | POA: Diagnosis not present

## 2023-07-05 NOTE — Telephone Encounter (Signed)
Patient has appointment 7/23 in office.  No further action needed at this time.

## 2023-07-06 ENCOUNTER — Telehealth: Payer: Self-pay

## 2023-07-06 ENCOUNTER — Encounter: Payer: Self-pay | Admitting: Nurse Practitioner

## 2023-07-06 ENCOUNTER — Ambulatory Visit (INDEPENDENT_AMBULATORY_CARE_PROVIDER_SITE_OTHER): Payer: Medicare PPO | Admitting: Nurse Practitioner

## 2023-07-06 VITALS — BP 110/68 | HR 80 | Temp 97.8°F | Ht 69.0 in | Wt 278.0 lb

## 2023-07-06 DIAGNOSIS — J3489 Other specified disorders of nose and nasal sinuses: Secondary | ICD-10-CM | POA: Diagnosis not present

## 2023-07-06 DIAGNOSIS — H938X3 Other specified disorders of ear, bilateral: Secondary | ICD-10-CM | POA: Diagnosis not present

## 2023-07-06 DIAGNOSIS — R051 Acute cough: Secondary | ICD-10-CM

## 2023-07-06 LAB — POC COVID19 BINAXNOW: SARS Coronavirus 2 Ag: NEGATIVE

## 2023-07-06 MED ORDER — PREDNISONE 20 MG PO TABS
ORAL_TABLET | ORAL | 0 refills | Status: AC
Start: 2023-07-06 — End: 2023-07-12

## 2023-07-06 NOTE — Assessment & Plan Note (Signed)
Given sinus pressure continue antihistamine and Flonase add on prednisone taper.

## 2023-07-06 NOTE — Assessment & Plan Note (Signed)
COVID test negative in office. 

## 2023-07-06 NOTE — Patient Instructions (Signed)
Nice to see you today Your covid test was negative in office Take the steroids with food. Avoid NSAIDS like ibuprofen, aleve, naproxen, BC/Goody poweders Follow up if no improvement

## 2023-07-06 NOTE — Progress Notes (Signed)
Acute Office Visit  Subjective:     Patient ID: Kathy Gamble, female    DOB: 07-02-1976, 47 y.o.   MRN: [id number]  Chief Complaint  Patient presents with   Cough    Productive cough with b/l Ear pain. Off an on for few weeks.      Patient is in today for sick symptoms with a history of htn, chronic migraine, asthma  Symptoms stared 2 weeks ago Covid vaccine: utd Flu vaccine: completed last season  OTC treatment : states that she was taking cough dorps and cold and flu medicine that helped som Albuterol,( every 2 weeks), flonase, xyzal, singular.  Sick contacts: no one in the house is sick.  States that her sympotms have stayed the same. States the mucous has dried up   Review of Systems  Constitutional:  Positive for malaise/fatigue. Negative for chills and fever.  HENT:  Positive for ear pain (pressure). Negative for sore throat.   Respiratory:  Positive for cough. Negative for sputum production and shortness of breath.   Musculoskeletal:  Negative for joint pain and myalgias.  Neurological:  Positive for headaches (baseline).        Objective:    BP 110/68   Pulse 80   Temp 97.8 F (36.6 C) (Temporal)   Ht 5\' 9"  (1.753 m)   Wt 278 lb (126.1 kg)   LMP  (LMP Unknown)   SpO2 99%   BMI 41.05 kg/m  BP Readings from Last 3 Encounters:  07/06/23 110/68  06/16/23 120/82  06/11/23 104/78   Wt Readings from Last 3 Encounters:  07/06/23 278 lb (126.1 kg)  06/16/23 283 lb (128.4 kg)  06/11/23 276 lb 8 oz (125.4 kg)      Physical Exam Vitals and nursing note reviewed.  Constitutional:      Appearance: Normal appearance.  HENT:     Right Ear: Ear canal and external ear normal.     Left Ear: Ear canal and external ear normal.     Ears:     Comments: Bilateral TM scarring     Nose:     Right Sinus: No maxillary sinus tenderness or frontal sinus tenderness.     Left Sinus: No maxillary sinus tenderness or frontal sinus tenderness.     Mouth/Throat:      Mouth: Mucous membranes are moist.     Pharynx: Oropharynx is clear.  Cardiovascular:     Rate and Rhythm: Normal rate and regular rhythm.     Heart sounds: Normal heart sounds.  Pulmonary:     Effort: Pulmonary effort is normal.     Breath sounds: Normal breath sounds.  Lymphadenopathy:     Cervical: No cervical adenopathy.  Neurological:     Mental Status: She is alert.     Results for orders placed or performed in visit on 07/06/23  POC COVID-19  Result Value Ref Range   SARS Coronavirus 2 Ag Negative Negative        Assessment & Plan:   Problem List Items Addressed This Visit       Nervous and Auditory   Sensation of fullness in both ears    Patient continue taking the Flonase and antihistamine while on prednisone taper.      Relevant Medications   predniSONE (DELTASONE) 20 MG tablet     Other   Acute cough - Primary    COVID test negative in office.      Relevant Orders   POC COVID-19 (Completed)  Sinus pressure    Given sinus pressure continue antihistamine and Flonase add on prednisone taper.      Relevant Medications   predniSONE (DELTASONE) 20 MG tablet    Meds ordered this encounter  Medications   predniSONE (DELTASONE) 20 MG tablet    Sig: Take 2 tablets (40 mg total) by mouth daily with breakfast for 3 days, THEN 1 tablet (20 mg total) daily with breakfast for 3 days.    Dispense:  9 tablet    Refill:  0    Order Specific Question:   Supervising Provider    Answer:   TOWER, MARNE A [[id number]    Return if symptoms worsen or fail to improve.  Audria Nine, NP

## 2023-07-06 NOTE — Assessment & Plan Note (Signed)
Patient continue taking the Flonase and antihistamine while on prednisone taper.

## 2023-07-06 NOTE — Telephone Encounter (Signed)
Unable to reach pt by phone  and Kathy Gamble at front desk said pt called in and is aware has appt today at 11AM with Audria Nine NP and nothing further needed.

## 2023-07-07 DIAGNOSIS — F411 Generalized anxiety disorder: Secondary | ICD-10-CM | POA: Diagnosis not present

## 2023-07-07 DIAGNOSIS — K219 Gastro-esophageal reflux disease without esophagitis: Secondary | ICD-10-CM | POA: Diagnosis not present

## 2023-07-07 DIAGNOSIS — G8929 Other chronic pain: Secondary | ICD-10-CM | POA: Diagnosis not present

## 2023-07-07 DIAGNOSIS — M47812 Spondylosis without myelopathy or radiculopathy, cervical region: Secondary | ICD-10-CM | POA: Diagnosis not present

## 2023-07-07 DIAGNOSIS — Z6841 Body Mass Index (BMI) 40.0 and over, adult: Secondary | ICD-10-CM | POA: Diagnosis not present

## 2023-07-07 DIAGNOSIS — G35 Multiple sclerosis: Secondary | ICD-10-CM | POA: Diagnosis not present

## 2023-07-07 DIAGNOSIS — I10 Essential (primary) hypertension: Secondary | ICD-10-CM | POA: Diagnosis not present

## 2023-07-07 DIAGNOSIS — G43909 Migraine, unspecified, not intractable, without status migrainosus: Secondary | ICD-10-CM | POA: Diagnosis not present

## 2023-07-08 DIAGNOSIS — G8929 Other chronic pain: Secondary | ICD-10-CM | POA: Diagnosis not present

## 2023-07-08 DIAGNOSIS — I10 Essential (primary) hypertension: Secondary | ICD-10-CM | POA: Diagnosis not present

## 2023-07-08 DIAGNOSIS — G43909 Migraine, unspecified, not intractable, without status migrainosus: Secondary | ICD-10-CM | POA: Diagnosis not present

## 2023-07-08 DIAGNOSIS — G35 Multiple sclerosis: Secondary | ICD-10-CM | POA: Diagnosis not present

## 2023-07-08 DIAGNOSIS — F411 Generalized anxiety disorder: Secondary | ICD-10-CM | POA: Diagnosis not present

## 2023-07-08 DIAGNOSIS — Z6841 Body Mass Index (BMI) 40.0 and over, adult: Secondary | ICD-10-CM | POA: Diagnosis not present

## 2023-07-09 ENCOUNTER — Encounter: Payer: Self-pay | Admitting: Primary Care

## 2023-07-09 DIAGNOSIS — Z6841 Body Mass Index (BMI) 40.0 and over, adult: Secondary | ICD-10-CM | POA: Diagnosis not present

## 2023-07-09 DIAGNOSIS — I10 Essential (primary) hypertension: Secondary | ICD-10-CM | POA: Diagnosis not present

## 2023-07-09 DIAGNOSIS — K219 Gastro-esophageal reflux disease without esophagitis: Secondary | ICD-10-CM | POA: Diagnosis not present

## 2023-07-09 DIAGNOSIS — M47812 Spondylosis without myelopathy or radiculopathy, cervical region: Secondary | ICD-10-CM | POA: Diagnosis not present

## 2023-07-09 DIAGNOSIS — F411 Generalized anxiety disorder: Secondary | ICD-10-CM | POA: Diagnosis not present

## 2023-07-09 DIAGNOSIS — G35 Multiple sclerosis: Secondary | ICD-10-CM | POA: Diagnosis not present

## 2023-07-09 DIAGNOSIS — G8929 Other chronic pain: Secondary | ICD-10-CM | POA: Diagnosis not present

## 2023-07-09 DIAGNOSIS — G43909 Migraine, unspecified, not intractable, without status migrainosus: Secondary | ICD-10-CM | POA: Diagnosis not present

## 2023-07-16 DIAGNOSIS — K219 Gastro-esophageal reflux disease without esophagitis: Secondary | ICD-10-CM | POA: Diagnosis not present

## 2023-07-16 DIAGNOSIS — Z6841 Body Mass Index (BMI) 40.0 and over, adult: Secondary | ICD-10-CM | POA: Diagnosis not present

## 2023-07-16 DIAGNOSIS — M47812 Spondylosis without myelopathy or radiculopathy, cervical region: Secondary | ICD-10-CM | POA: Diagnosis not present

## 2023-07-16 DIAGNOSIS — G35 Multiple sclerosis: Secondary | ICD-10-CM | POA: Diagnosis not present

## 2023-07-16 DIAGNOSIS — F411 Generalized anxiety disorder: Secondary | ICD-10-CM | POA: Diagnosis not present

## 2023-07-16 DIAGNOSIS — I10 Essential (primary) hypertension: Secondary | ICD-10-CM | POA: Diagnosis not present

## 2023-07-16 DIAGNOSIS — G8929 Other chronic pain: Secondary | ICD-10-CM | POA: Diagnosis not present

## 2023-07-16 DIAGNOSIS — G43909 Migraine, unspecified, not intractable, without status migrainosus: Secondary | ICD-10-CM | POA: Diagnosis not present

## 2023-07-19 DIAGNOSIS — Z6841 Body Mass Index (BMI) 40.0 and over, adult: Secondary | ICD-10-CM | POA: Diagnosis not present

## 2023-07-19 DIAGNOSIS — F411 Generalized anxiety disorder: Secondary | ICD-10-CM | POA: Diagnosis not present

## 2023-07-19 DIAGNOSIS — G8929 Other chronic pain: Secondary | ICD-10-CM | POA: Diagnosis not present

## 2023-07-19 DIAGNOSIS — G35 Multiple sclerosis: Secondary | ICD-10-CM | POA: Diagnosis not present

## 2023-07-19 DIAGNOSIS — I10 Essential (primary) hypertension: Secondary | ICD-10-CM | POA: Diagnosis not present

## 2023-07-19 DIAGNOSIS — K219 Gastro-esophageal reflux disease without esophagitis: Secondary | ICD-10-CM | POA: Diagnosis not present

## 2023-07-19 DIAGNOSIS — G43909 Migraine, unspecified, not intractable, without status migrainosus: Secondary | ICD-10-CM | POA: Diagnosis not present

## 2023-07-19 DIAGNOSIS — M47812 Spondylosis without myelopathy or radiculopathy, cervical region: Secondary | ICD-10-CM | POA: Diagnosis not present

## 2023-07-20 ENCOUNTER — Encounter: Payer: Self-pay | Admitting: Internal Medicine

## 2023-07-20 ENCOUNTER — Ambulatory Visit (INDEPENDENT_AMBULATORY_CARE_PROVIDER_SITE_OTHER): Payer: Medicare PPO | Admitting: Internal Medicine

## 2023-07-20 VITALS — BP 112/86 | HR 62 | Temp 97.4°F | Ht 69.0 in | Wt 286.0 lb

## 2023-07-20 DIAGNOSIS — M47812 Spondylosis without myelopathy or radiculopathy, cervical region: Secondary | ICD-10-CM | POA: Diagnosis not present

## 2023-07-20 DIAGNOSIS — J0140 Acute pansinusitis, unspecified: Secondary | ICD-10-CM | POA: Diagnosis not present

## 2023-07-20 DIAGNOSIS — G8929 Other chronic pain: Secondary | ICD-10-CM | POA: Diagnosis not present

## 2023-07-20 DIAGNOSIS — G35 Multiple sclerosis: Secondary | ICD-10-CM | POA: Diagnosis not present

## 2023-07-20 DIAGNOSIS — G43909 Migraine, unspecified, not intractable, without status migrainosus: Secondary | ICD-10-CM | POA: Diagnosis not present

## 2023-07-20 DIAGNOSIS — K219 Gastro-esophageal reflux disease without esophagitis: Secondary | ICD-10-CM | POA: Diagnosis not present

## 2023-07-20 DIAGNOSIS — Z6841 Body Mass Index (BMI) 40.0 and over, adult: Secondary | ICD-10-CM | POA: Diagnosis not present

## 2023-07-20 DIAGNOSIS — I10 Essential (primary) hypertension: Secondary | ICD-10-CM | POA: Diagnosis not present

## 2023-07-20 DIAGNOSIS — F411 Generalized anxiety disorder: Secondary | ICD-10-CM | POA: Diagnosis not present

## 2023-07-20 MED ORDER — AMOXICILLIN-POT CLAVULANATE 875-125 MG PO TABS
1.0000 | ORAL_TABLET | Freq: Two times a day (BID) | ORAL | 0 refills | Status: DC
Start: 2023-07-20 — End: 2023-08-11

## 2023-07-20 NOTE — Assessment & Plan Note (Signed)
Persistent symptoms for over 2 weeks Continue allergy regimen Will give augmentin 875 bid x 7 days If poor response, would change to doxy

## 2023-07-20 NOTE — Progress Notes (Signed)
Subjective:    Patient ID: Kathy Gamble, female    DOB: December 19, 1975, 47 y.o.   MRN: [id number]  HPI Here due to persistent respiratory symptoms  Seen about 2 weeks ago with respiratory symptoms Got prednisone but didn't help (had trouble sleeping) Headaches persisting Had facial pressure--persistent frontal symptoms Nasal congestion--using mucinex (helps a little) Some cough--some yellow mucus (from drainage) No fever Some SOB at times---uses albuterol and it helps  Is on allergy regimen  Current Outpatient Medications on File Prior to Visit  Medication Sig Dispense Refill   albuterol (VENTOLIN HFA) 108 (90 Base) MCG/ACT inhaler Inhale 1-2 puffs into the lungs every 6 hours as needed for wheezing and or shortness of breah. 8.5 g 0   amLODipine (NORVASC) 10 MG tablet Take 1 tablet (10 mg total) by mouth daily. 90 tablet 1   fluticasone (FLONASE) 50 MCG/ACT nasal spray INSTILL 1 SPRAY IN EACH NOSTRIL ONCE DAILY 16 g 0   gabapentin (NEURONTIN) 400 MG capsule Take 400 mg by mouth at bedtime.     levocetirizine (XYZAL) 5 MG tablet TAKE ONE TABLET BY MOUTH ONCE daily AS NEEDED 90 tablet 2   losartan (COZAAR) 25 MG tablet TAKE ONE TABLET BY MOUTH ONCE DAILY 90 tablet 1   montelukast (SINGULAIR) 10 MG tablet TAKE ONE TABLET BY MOUTH ONCE DAILY FOR sinuses 90 tablet 3   Multiple Vitamin (MULTIVITAMIN) capsule Take 1 capsule by mouth daily.     omeprazole (PRILOSEC) 20 MG capsule Take 1 capsule (20 mg total) by mouth daily. 90 capsule 0   tiZANidine (ZANAFLEX) 4 MG tablet Take 1 tablet (4 mg total) by mouth at bedtime as needed for muscle spasms. 14 tablet 0   venlafaxine (EFFEXOR) 75 MG tablet Take 75 mg by mouth at bedtime.     zonisamide (ZONEGRAN) 25 MG capsule Take 25 mg by mouth at bedtime.     No current facility-administered medications on file prior to visit.    Allergies  Allergen Reactions   Bee Venom Itching   Nortriptyline Other (See Comments)    GI distress   Latex Rash     The powder in the gloves causes rash    Past Medical History:  Diagnosis Date   Allergy    Bee sting allergy 03/02/2015   Chicken pox    Frequent headaches    GERD (gastroesophageal reflux disease)     Past Surgical History:  Procedure Laterality Date   ADENOIDECTOMY     TONSILLECTOMY      Family History  Problem Relation Age of Onset   Stroke Mother    Mental retardation Mother    Cancer Maternal Grandmother    Cancer Paternal Grandmother        Breast   Heart disease Paternal Grandfather     Social History   Socioeconomic History   Marital status: Married    Spouse name: Not on file   Number of children: Not on file   Years of education: Not on file   Highest education level: Not on file  Occupational History   Occupation: disabled  Tobacco Use   Smoking status: Never   Smokeless tobacco: Never  Vaping Use   Vaping status: Never Used  Substance and Sexual Activity   Alcohol use: No    Alcohol/week: 0.0 standard drinks of alcohol   Drug use: Not Currently    Types: Cocaine   Sexual activity: Yes    Birth control/protection: None  Other Topics Concern  Not on file  Social History Narrative   Cognitive dysfunction - lives with her husband   Social Determinants of Health   Financial Resource Strain: Low Risk  (04/21/2023)   Overall Financial Resource Strain (CARDIA)    Difficulty of Paying Living Expenses: Not hard at all  Food Insecurity: No Food Insecurity (04/21/2023)   Hunger Vital Sign    Worried About Running Out of Food in the Last Year: Never true    Ran Out of Food in the Last Year: Never true  Transportation Needs: No Transportation Needs (04/21/2023)   PRAPARE - Administrator, Civil Service (Medical): No    Lack of Transportation (Non-Medical): No  Physical Activity: Insufficiently Active (04/21/2023)   Exercise Vital Sign    Days of Exercise per Week: 2 days    Minutes of Exercise per Session: 20 min  Stress: No Stress Concern  Present (04/21/2023)   [employer] of Occupational Health - Occupational Stress Questionnaire    Feeling of Stress : Not at all  Social Connections: Moderately Integrated (04/21/2023)   Social Connection and Isolation Panel [NHANES]    Frequency of Communication with Friends and Family: More than three times a week    Frequency of Social Gatherings with Friends and Family: More than three times a week    Attends Religious Services: More than 4 times per year    Active Member of [employer] or Organizations: No    Attends Banker Meetings: Never    Marital Status: Married  Catering manager Violence: Not At Risk (04/21/2023)   Humiliation, Afraid, Rape, and Kick questionnaire    Fear of Current or Ex-Partner: No    Emotionally Abused: No    Physically Abused: No    Sexually Abused: No   Review of Systems No loss of smell or taste Some nausea last night---no vomiting Eating okay     Objective:   Physical Exam Constitutional:      Appearance: Normal appearance.  HENT:     Head:     Comments: Frontal > maxillary tenderness    Right Ear: Tympanic membrane and ear canal normal.     Left Ear: Tympanic membrane and ear canal normal.     Mouth/Throat:     Pharynx: No oropharyngeal exudate or posterior oropharyngeal erythema.  Neck:     Comments: Mildly tender small anterior cervical nodes Pulmonary:     Effort: Pulmonary effort is normal.     Breath sounds: Normal breath sounds. No wheezing or rales.  Musculoskeletal:     Cervical back: Neck supple.  Neurological:     Mental Status: She is alert.            Assessment & Plan:

## 2023-07-21 DIAGNOSIS — M542 Cervicalgia: Secondary | ICD-10-CM | POA: Diagnosis not present

## 2023-07-21 DIAGNOSIS — F411 Generalized anxiety disorder: Secondary | ICD-10-CM | POA: Diagnosis not present

## 2023-07-21 DIAGNOSIS — G479 Sleep disorder, unspecified: Secondary | ICD-10-CM | POA: Diagnosis not present

## 2023-07-21 DIAGNOSIS — R519 Headache, unspecified: Secondary | ICD-10-CM | POA: Diagnosis not present

## 2023-07-27 ENCOUNTER — Ambulatory Visit (INDEPENDENT_AMBULATORY_CARE_PROVIDER_SITE_OTHER): Payer: Medicare PPO | Admitting: Nurse Practitioner

## 2023-07-27 ENCOUNTER — Encounter: Payer: Self-pay | Admitting: Nurse Practitioner

## 2023-07-27 DIAGNOSIS — K219 Gastro-esophageal reflux disease without esophagitis: Secondary | ICD-10-CM | POA: Diagnosis not present

## 2023-07-27 DIAGNOSIS — M79604 Pain in right leg: Secondary | ICD-10-CM

## 2023-07-27 DIAGNOSIS — H9203 Otalgia, bilateral: Secondary | ICD-10-CM | POA: Diagnosis not present

## 2023-07-27 DIAGNOSIS — Z6841 Body Mass Index (BMI) 40.0 and over, adult: Secondary | ICD-10-CM

## 2023-07-27 DIAGNOSIS — M79605 Pain in left leg: Secondary | ICD-10-CM | POA: Diagnosis not present

## 2023-07-27 DIAGNOSIS — I10 Essential (primary) hypertension: Secondary | ICD-10-CM

## 2023-07-27 MED ORDER — WEGOVY 0.25 MG/0.5ML ~~LOC~~ SOAJ
0.2500 mg | SUBCUTANEOUS | 0 refills | Status: DC
Start: 2023-07-27 — End: 2023-11-05

## 2023-07-27 NOTE — Assessment & Plan Note (Signed)
Patient is been working on diet and exercise without great benefit.  Will trial on the injectable GLP-1 drugs.  Told patient likely not covered under her insurance plan for obesity did give patient information about healthy weight wellness clinic in [place] she can call and get an appointment at her leisure.  Continue work on healthy lifestyle modifications.  Her comorbidities include GERD and hypertension

## 2023-07-27 NOTE — Patient Instructions (Addendum)
Nice to see you today I have sent in the injection to help with weight loss. I do not think that it is covered. If not I recommend going to   [facility]  Address: [address] [place], [place], [place] [address] Phone: [phone]

## 2023-07-27 NOTE — Assessment & Plan Note (Signed)
Complaining of bilateral calf pain for approximately 2 weeks.  Patient started physical therapy about 3 weeks ago.  Describes as a soreness likely secondary from deconditioning and doing workouts with the physical therapist.  Stable continue to monitor

## 2023-07-27 NOTE — Assessment & Plan Note (Signed)
Recurrent issue.  Patient currently finishing up Augmentin prescription.  Ears within her limits bilaterally.  Ambulatory referral to ENT for further evaluation and workup

## 2023-07-27 NOTE — Progress Notes (Signed)
Established Patient Office Visit  Subjective   Patient ID: Kathy Gamble, female    DOB: [birth year]  Age: 47 y.o. MRN: [id number]  Chief Complaint  Patient presents with   Weight Loss    Pt would like to discuss weight loss options and pain in both calves that started two weeks ago.    Ear Pain    Pt states antibiotic is not working and her ears still cause her pain.      HPI  Weight loss: we have dicussed this in the past. States that she does eat approx 3 times a day. Drinks flavored water, water, and coffee. Will have a soda sometimes.  She has cut back on the sweets  Exercise: states that she has done the elliptical once a week fi she does not go outside. States it will be in20 mins in duration  When she walks outside it is 10-15 mins per round trip  Comorbid conditions of HTN and GERD   States that both her calves have been hurting for 2 weeks. States that she thinks she pulled a muscle maybe walking. States that she will drink approx 80 plus oz. States that it hurts with rest and movement.   Ear pain: patient was seen on 07/20/2023 by Tillman Abide and prescribed Augmentin for sinusitis. States that both ears are hurting. States that today is the last day of the antibiotics states that the ears are hurting more.    Review of Systems  Constitutional:  Negative for chills and fever.  HENT:  Positive for ear pain and sore throat. Negative for ear discharge.   Respiratory:  Positive for cough and sputum production (yellow). Negative for shortness of breath.   Cardiovascular:  Negative for chest pain.  Neurological:  Positive for headaches.  Psychiatric/Behavioral:  Negative for hallucinations and suicidal ideas.       Objective:     BP 124/88   Pulse 94   Temp 99.2 F (37.3 C) (Temporal)   Ht 5\' 9"  (1.753 m)   Wt 285 lb 12.8 oz (129.6 kg)   LMP  (LMP Unknown)   SpO2 99%   BMI 42.21 kg/m  BP Readings from Last 3 Encounters:  07/27/23 124/88  07/20/23 112/86   07/06/23 110/68   Wt Readings from Last 3 Encounters:  07/27/23 285 lb 12.8 oz (129.6 kg)  07/20/23 286 lb (129.7 kg)  07/06/23 278 lb (126.1 kg)      Physical Exam Vitals and nursing note reviewed.  Constitutional:      Appearance: Normal appearance.  HENT:     Right Ear: Ear canal and external ear normal.     Left Ear: Ear canal and external ear normal.     Ears:     Comments: Scarring to bilateral TMs    Mouth/Throat:     Mouth: Mucous membranes are moist.     Pharynx: No posterior oropharyngeal erythema.  Cardiovascular:     Rate and Rhythm: Normal rate and regular rhythm.     Pulses:          Posterior tibial pulses are 1+ on the right side and 1+ on the left side.     Heart sounds: Normal heart sounds.  Pulmonary:     Effort: Pulmonary effort is normal.     Breath sounds: Normal breath sounds.  Musculoskeletal:        General: No tenderness or signs of injury.     Comments: Bilateral calves supple to palpation.  No redness noted.  No tenderness to palpation patient describes as a sore sensation  Lymphadenopathy:     Cervical: No cervical adenopathy.  Neurological:     Mental Status: She is alert.      No results found for any visits on 07/27/23.    The 10-year ASCVD risk score (Arnett DK, et al., 2019) is: 0.7%    Assessment & Plan:   Problem List Items Addressed This Visit       Cardiovascular and Mediastinum   Primary hypertension   Relevant Medications   Semaglutide-Weight Management (WEGOVY) 0.25 MG/0.5ML SOAJ     Digestive   Gastroesophageal reflux disease without esophagitis   Relevant Medications   Semaglutide-Weight Management (WEGOVY) 0.25 MG/0.5ML SOAJ     Other   Pain in both lower extremities    Complaining of bilateral calf pain for approximately 2 weeks.  Patient started physical therapy about 3 weeks ago.  Describes as a soreness likely secondary from deconditioning and doing workouts with the physical therapist.  Stable continue  to monitor      Morbid obesity (HCC) - Primary    Patient is been working on diet and exercise without great benefit.  Will trial on the injectable GLP-1 drugs.  Told patient likely not covered under her insurance plan for obesity did give patient information about healthy weight wellness clinic in [place] she can call and get an appointment at her leisure.  Continue work on healthy lifestyle modifications.  Her comorbidities include GERD and hypertension      Relevant Medications   Semaglutide-Weight Management (WEGOVY) 0.25 MG/0.5ML SOAJ   Otalgia of both ears    Recurrent issue.  Patient currently finishing up Augmentin prescription.  Ears within her limits bilaterally.  Ambulatory referral to ENT for further evaluation and workup      Relevant Orders   Ambulatory referral to ENT    Return if symptoms worsen or fail to improve.    Audria Nine, NP

## 2023-08-03 DIAGNOSIS — Z6841 Body Mass Index (BMI) 40.0 and over, adult: Secondary | ICD-10-CM | POA: Diagnosis not present

## 2023-08-03 DIAGNOSIS — G8929 Other chronic pain: Secondary | ICD-10-CM | POA: Diagnosis not present

## 2023-08-03 DIAGNOSIS — K219 Gastro-esophageal reflux disease without esophagitis: Secondary | ICD-10-CM | POA: Diagnosis not present

## 2023-08-03 DIAGNOSIS — M47812 Spondylosis without myelopathy or radiculopathy, cervical region: Secondary | ICD-10-CM | POA: Diagnosis not present

## 2023-08-03 DIAGNOSIS — G35 Multiple sclerosis: Secondary | ICD-10-CM | POA: Diagnosis not present

## 2023-08-03 DIAGNOSIS — G43909 Migraine, unspecified, not intractable, without status migrainosus: Secondary | ICD-10-CM | POA: Diagnosis not present

## 2023-08-03 DIAGNOSIS — F411 Generalized anxiety disorder: Secondary | ICD-10-CM | POA: Diagnosis not present

## 2023-08-03 DIAGNOSIS — I10 Essential (primary) hypertension: Secondary | ICD-10-CM | POA: Diagnosis not present

## 2023-08-10 NOTE — Progress Notes (Unsigned)
    Verneda Hollopeter T. Flemon Kelty, MD, CAQ Sports Medicine [facility] [address] [place], [address]  Phone: [phone]  FAX: [phone]  Kathy Gamble - 47 y.o. female  MRN [id number]  Date of Birth: 30-Dec-1975  Date: 08/11/2023  PCP: Eden Emms, NP  Referral: Eden Emms, NP  No chief complaint on file.  Subjective:   Kathy Gamble is a 47 y.o. very pleasant female patient with There is no height or weight on file to calculate BMI. who presents with the following:  I remember Alucio well, having seen her multiple times over the years.  She presents with some left sided leg pain after dropping a TV on her leg.    Review of Systems is noted in the HPI, as appropriate  Objective:   LMP  (LMP Unknown)   GEN: No acute distress; alert,appropriate. PULM: Breathing comfortably in no respiratory distress PSYCH: Normally interactive.   Laboratory and Imaging Data:  Assessment and Plan:   ***

## 2023-08-11 ENCOUNTER — Ambulatory Visit: Payer: Medicare PPO | Admitting: Family Medicine

## 2023-08-11 ENCOUNTER — Ambulatory Visit (INDEPENDENT_AMBULATORY_CARE_PROVIDER_SITE_OTHER): Payer: Medicare PPO | Admitting: Family Medicine

## 2023-08-11 ENCOUNTER — Encounter: Payer: Self-pay | Admitting: Family Medicine

## 2023-08-11 VITALS — BP 90/72 | HR 90 | Temp 98.0°F | Ht 69.0 in | Wt 282.0 lb

## 2023-08-11 DIAGNOSIS — S8012XA Contusion of left lower leg, initial encounter: Secondary | ICD-10-CM

## 2023-08-13 ENCOUNTER — Telehealth: Payer: Self-pay

## 2023-08-13 NOTE — Telephone Encounter (Signed)
Contacted pt to advise her of ENT referral appointment on 9/4. Pt confirmed and verbalized back to me. Educated pt on what to bring to her appointment. No questions or concerns.

## 2023-08-17 ENCOUNTER — Telehealth: Payer: Self-pay | Admitting: Nurse Practitioner

## 2023-08-17 DIAGNOSIS — G43909 Migraine, unspecified, not intractable, without status migrainosus: Secondary | ICD-10-CM | POA: Diagnosis not present

## 2023-08-17 DIAGNOSIS — M47812 Spondylosis without myelopathy or radiculopathy, cervical region: Secondary | ICD-10-CM | POA: Diagnosis not present

## 2023-08-17 DIAGNOSIS — G8929 Other chronic pain: Secondary | ICD-10-CM | POA: Diagnosis not present

## 2023-08-17 DIAGNOSIS — I10 Essential (primary) hypertension: Secondary | ICD-10-CM | POA: Diagnosis not present

## 2023-08-17 DIAGNOSIS — G35 Multiple sclerosis: Secondary | ICD-10-CM | POA: Diagnosis not present

## 2023-08-17 DIAGNOSIS — K219 Gastro-esophageal reflux disease without esophagitis: Secondary | ICD-10-CM | POA: Diagnosis not present

## 2023-08-17 DIAGNOSIS — Z6841 Body Mass Index (BMI) 40.0 and over, adult: Secondary | ICD-10-CM | POA: Diagnosis not present

## 2023-08-17 DIAGNOSIS — F411 Generalized anxiety disorder: Secondary | ICD-10-CM | POA: Diagnosis not present

## 2023-08-17 NOTE — Telephone Encounter (Signed)
As long as it is helping she can continue to use it

## 2023-08-17 NOTE — Telephone Encounter (Signed)
Called patient reviewed all information and repeated back to me. Will call if any questions.  ? ?

## 2023-08-17 NOTE — Telephone Encounter (Signed)
Patient was seen on 08/11/2023 with copland for a contusion on her leg.She called in today wanting to know how long should she keep icing her leg? Please advise.   Im sending this message to her pcp since copland is not in the office and will probably not be checking his messages.

## 2023-08-18 ENCOUNTER — Telehealth: Payer: Self-pay | Admitting: Nurse Practitioner

## 2023-08-18 DIAGNOSIS — H6983 Other specified disorders of Eustachian tube, bilateral: Secondary | ICD-10-CM | POA: Diagnosis not present

## 2023-08-18 DIAGNOSIS — H7403 Tympanosclerosis, bilateral: Secondary | ICD-10-CM | POA: Diagnosis not present

## 2023-08-18 DIAGNOSIS — H9203 Otalgia, bilateral: Secondary | ICD-10-CM | POA: Diagnosis not present

## 2023-08-18 NOTE — Telephone Encounter (Signed)
Patient called in and stated that her leg is swollen and when she last was  here the  doctor didn't call her in anything  in to the pharmacy, and she wants to know If the provider will call her a pain medication in. patient would like a call back if possible [phone]

## 2023-08-18 NOTE — Telephone Encounter (Signed)
Prescription Request  08/18/2023  LOV: 07/27/2023  What is the name of the medication or equipment? omeprazole (PRILOSEC) 20 MG capsule   levocetirizine (XYZAL) 5 MG tablet    Have you contacted your pharmacy to request a refill? No   Which pharmacy would you like this sent to?  [id number] - [place], [place] - [address] AT [facility] [address] [facility] [address] [place] [address] Phone: [phone] Fax: [phone]    Patient notified that their request is being sent to the clinical staff for review and that they should receive a response within 2 business days.   Please advise at Mobile [phone] (mobile)

## 2023-08-19 NOTE — Telephone Encounter (Signed)
The provider she saw is out of office. If she is needing pain relief she can use over the counter tylenol and follow the instructions on the bottle

## 2023-08-19 NOTE — Telephone Encounter (Signed)
Patient called back reviewed medications recommendations patient states that she has been taking over the counter medications with no improvement. Patient sates that legs are still swelling and pain is moving down legs. I have set up for evaluation with Dr. Ermalene Searing tomorrow.

## 2023-08-19 NOTE — Telephone Encounter (Signed)
Noted  

## 2023-08-19 NOTE — Telephone Encounter (Signed)
Left message to return call to our office.  

## 2023-08-20 ENCOUNTER — Telehealth: Payer: Self-pay | Admitting: Nurse Practitioner

## 2023-08-20 ENCOUNTER — Encounter: Payer: Self-pay | Admitting: Family Medicine

## 2023-08-20 ENCOUNTER — Ambulatory Visit (INDEPENDENT_AMBULATORY_CARE_PROVIDER_SITE_OTHER): Payer: Medicare PPO | Admitting: Family Medicine

## 2023-08-20 VITALS — BP 110/80 | HR 96 | Temp 97.8°F | Ht 69.0 in | Wt 281.4 lb

## 2023-08-20 DIAGNOSIS — I10 Essential (primary) hypertension: Secondary | ICD-10-CM | POA: Diagnosis not present

## 2023-08-20 DIAGNOSIS — M79605 Pain in left leg: Secondary | ICD-10-CM | POA: Diagnosis not present

## 2023-08-20 MED ORDER — OMEPRAZOLE 20 MG PO CPDR: 20.0000 mg | DELAYED_RELEASE_CAPSULE | Freq: Every day | ORAL | 0 refills | Status: DC

## 2023-08-20 MED ORDER — AMLODIPINE BESYLATE 10 MG PO TABS
10.0000 mg | ORAL_TABLET | Freq: Every day | ORAL | 3 refills | Status: DC
Start: 2023-08-20 — End: 2024-01-25

## 2023-08-20 MED ORDER — MONTELUKAST SODIUM 10 MG PO TABS: ORAL_TABLET | ORAL | 3 refills | Status: DC

## 2023-08-20 MED ORDER — LEVOCETIRIZINE DIHYDROCHLORIDE 5 MG PO TABS: 5.0000 mg | ORAL_TABLET | Freq: Every evening | ORAL | 2 refills | Status: DC

## 2023-08-20 NOTE — Telephone Encounter (Addendum)
Request for refill of SINGULAIR has been ordered today.

## 2023-08-20 NOTE — Assessment & Plan Note (Signed)
Acute, most likely left shin pain secondary to soft tissue and bone contusion.  Associated swelling and patient with history of venous insufficiency likely due to decreased mobility.  No sign and symptom of DVT.  No suggestion of bony fracture, patient weightbearing.  Pain in left knee and ankle possibly secondary to changed gait given recent injury.   Start Voltaren gel 4 times daily as needed on left leg/ knee/ankle for pain.   Stop Excedrin and tylenol ( acetaminophen) 650 mg 2 capsule twice daily as needed for pain.  Elevate leg.  Can use cane for balance.  Start home stretching exercises... Info given.

## 2023-08-20 NOTE — Telephone Encounter (Signed)
Patient called in returning call. Relayed message to patient.

## 2023-08-20 NOTE — Patient Instructions (Addendum)
Start Voltaren gel 4 times daily as needed on left leg/ knee/ankle for pain.   Stop Excedrin and tylenol ( acetaminophen) 650 mg 2 capsule twice daily as needed for pain.  Elevate leg.  Can use cane for balance.  Start home stretching exercises.

## 2023-08-20 NOTE — Addendum Note (Signed)
Addended by: Eden Emms on: 08/20/2023 01:32 PM   Modules accepted: Orders

## 2023-08-20 NOTE — Telephone Encounter (Signed)
LAST APPOINTMENT DATE: 07/27/23   NEXT APPOINTMENT DATE: 11/22/23  Prilosec 20mg  LAST REFILL: 4/24  QTY: 90 0RF  XYZAL 5mg  LAST REFILL: 01/04/23 QTY: #90 2RF

## 2023-08-20 NOTE — Telephone Encounter (Signed)
Prescription Request  08/20/2023  LOV: 07/27/2023  What is the name of the medication or equipment? montelukast (SINGULAIR) 10 MG tablet   Have you contacted your pharmacy to request a refill? No   Which pharmacy would you like this sent to?  [id number] - [place], [place] - [address] AT [facility] [address] [facility] [address] [place] [address] Phone: [phone] Fax: [phone]    Patient notified that their request is being sent to the clinical staff for review and that they should receive a response within 2 business days.   Please advise at Mobile [phone] (mobile)

## 2023-08-20 NOTE — Progress Notes (Signed)
Patient ID: Kathy Gamble, female    DOB: 20-Mar-1976, 47 y.o.   MRN: [id number]  This visit was conducted in person.  BP 110/80 (BP Location: Right Arm, Patient Position: Sitting, Cuff Size: Large)   Pulse 96   Temp 97.8 F (36.6 C) (Temporal)   Ht 5\' 9"  (1.753 m)   Wt 281 lb 6 oz (127.6 kg)   LMP  (LMP Unknown)   SpO2 97%   BMI 41.55 kg/m    CC:  Chief Complaint  Patient presents with   Leg Pain    Seen by Dr. Patsy Lager 08/11/23   Joint Swelling    Ankle and Knee Left    Subjective:   HPI: Kathy Gamble is a 47 y.o. female presenting on 08/20/2023 for Leg Pain (Seen by Dr. Patsy Lager 08/11/23) and Joint Swelling (Ankle and Knee Left)  Recent office visit with Dr. Patsy Lager sports medicine on August 11, 2023 She reported left-sided leg pain after dropped a TV on her leg.  Occurring 3 weeks ago.  Diagnosed with soft tissue and bone bruising, no concern for fracture at that time. Recommended symptomatic care and time.  Today she reports she has continued pain in left lower leg as well as swelling in her left knee and ankle.  Initial swelling has improved.  Using heat and stretching for pain. Elevating leg.  Using  compression hose to help with swelling.  Excedrin not helping  with pain.  Relevant past medical, surgical, family and social history reviewed and updated as indicated. Interim medical history since our last visit reviewed. Allergies and medications reviewed and updated. Outpatient Medications Prior to Visit  Medication Sig Dispense Refill   albuterol (VENTOLIN HFA) 108 (90 Base) MCG/ACT inhaler Inhale 1-2 puffs into the lungs every 6 hours as needed for wheezing and or shortness of breah. 8.5 g 0   fluticasone (FLONASE) 50 MCG/ACT nasal spray INSTILL 1 SPRAY IN EACH NOSTRIL ONCE DAILY 16 g 0   gabapentin (NEURONTIN) 400 MG capsule Take 800 mg by mouth 2 (two) times daily.     levocetirizine (XYZAL) 5 MG tablet TAKE ONE TABLET BY MOUTH ONCE daily AS NEEDED 90 tablet 2    losartan (COZAAR) 25 MG tablet TAKE ONE TABLET BY MOUTH ONCE DAILY 90 tablet 1   Multiple Vitamin (MULTIVITAMIN) capsule Take 1 capsule by mouth daily.     nortriptyline (PAMELOR) 10 MG capsule Take 20 mg by mouth at bedtime.     Semaglutide-Weight Management (WEGOVY) 0.25 MG/0.5ML SOAJ Inject 0.25 mg into the skin once a week. 2 mL 0   tiZANidine (ZANAFLEX) 4 MG tablet Take 1 tablet (4 mg total) by mouth at bedtime as needed for muscle spasms. 14 tablet 0   venlafaxine (EFFEXOR) 75 MG tablet Take 75 mg by mouth at bedtime.     zonisamide (ZONEGRAN) 100 MG capsule Take 100 mg by mouth at bedtime.     amLODipine (NORVASC) 10 MG tablet Take 1 tablet (10 mg total) by mouth daily. 90 tablet 1   montelukast (SINGULAIR) 10 MG tablet TAKE ONE TABLET BY MOUTH ONCE DAILY FOR sinuses 90 tablet 3   omeprazole (PRILOSEC) 20 MG capsule Take 1 capsule (20 mg total) by mouth daily. 90 capsule 0   No facility-administered medications prior to visit.     Per HPI unless specifically indicated in ROS section below Review of Systems  Constitutional:  Negative for fatigue and fever.  HENT:  Negative for congestion.   Eyes:  Negative for pain.  Respiratory:  Negative for cough and shortness of breath.   Cardiovascular:  Negative for chest pain, palpitations and leg swelling.  Gastrointestinal:  Negative for abdominal pain.  Genitourinary:  Negative for dysuria and vaginal bleeding.  Musculoskeletal:  Negative for back pain.  Neurological:  Negative for syncope, light-headedness and headaches.  Psychiatric/Behavioral:  Negative for dysphoric mood.    Objective:  BP 110/80 (BP Location: Right Arm, Patient Position: Sitting, Cuff Size: Large)   Pulse 96   Temp 97.8 F (36.6 C) (Temporal)   Ht 5\' 9"  (1.753 m)   Wt 281 lb 6 oz (127.6 kg)   LMP  (LMP Unknown)   SpO2 97%   BMI 41.55 kg/m   Wt Readings from Last 3 Encounters:  08/20/23 281 lb 6 oz (127.6 kg)  08/11/23 282 lb (127.9 kg)  07/27/23 285 lb  12.8 oz (129.6 kg)      Physical Exam Constitutional:      General: She is not in acute distress.    Appearance: Normal appearance. She is well-developed. She is not ill-appearing or toxic-appearing.  HENT:     Head: Normocephalic.     Right Ear: Hearing, tympanic membrane, ear canal and external ear normal. Tympanic membrane is not erythematous, retracted or bulging.     Left Ear: Hearing, tympanic membrane, ear canal and external ear normal. Tympanic membrane is not erythematous, retracted or bulging.     Nose: No mucosal edema or rhinorrhea.     Right Sinus: No maxillary sinus tenderness or frontal sinus tenderness.     Left Sinus: No maxillary sinus tenderness or frontal sinus tenderness.     Mouth/Throat:     Mouth: Oropharynx is clear and moist and mucous membranes are normal.     Pharynx: Uvula midline.  Eyes:     General: Lids are normal. Lids are everted, no foreign bodies appreciated.     Extraocular Movements: EOM normal.     Conjunctiva/sclera: Conjunctivae normal.     Pupils: Pupils are equal, round, and reactive to light.  Neck:     Thyroid: No thyroid mass or thyromegaly.     Vascular: No carotid bruit.     Trachea: Trachea normal.  Cardiovascular:     Rate and Rhythm: Normal rate and regular rhythm.     Pulses: Normal pulses.     Heart sounds: Normal heart sounds, S1 normal and S2 normal. No murmur heard.    No friction rub. No gallop.  Pulmonary:     Effort: Pulmonary effort is normal. No tachypnea or respiratory distress.     Breath sounds: Normal breath sounds. No decreased breath sounds, wheezing, rhonchi or rales.  Chest:     Comments: bilateral lower extremity pitting edema, left slightly greater than right Abdominal:     General: Bowel sounds are normal.     Palpations: Abdomen is soft.     Tenderness: There is no abdominal tenderness.  Musculoskeletal:     Cervical back: Normal range of motion and neck supple.     Left knee: Normal. No bony  tenderness. Normal range of motion. No tenderness. Normal meniscus.     Left lower leg: Swelling, tenderness and bony tenderness present.     Left ankle: Swelling present. No tenderness. Normal range of motion. Anterior drawer test negative. Normal pulse.     Left Achilles Tendon: Normal. No tenderness.     Comments: Tender to palpation anteriorly over left shin at area of healing bruise/small scratch  Patient reports diffuse tenderness in knee and ankle but is not tender to palpation Negative Homans' sign, no focal calf tenderness   Skin:    General: Skin is warm, dry and intact.     Findings: No rash.  Neurological:     Mental Status: She is alert.  Psychiatric:        Mood and Affect: Mood is not anxious or depressed.        Speech: Speech normal.        Behavior: Behavior normal. Behavior is cooperative.        Thought Content: Thought content normal.        Cognition and Memory: Cognition and memory normal.        Judgment: Judgment normal.       Results for orders placed or performed in visit on 07/09/23  HM DIABETES EYE EXAM  Result Value Ref Range   HM Diabetic Eye Exam No Retinopathy No Retinopathy    Assessment and Plan  Left leg pain Assessment & Plan: Acute, most likely left shin pain secondary to soft tissue and bone contusion.  Associated swelling and patient with history of venous insufficiency likely due to decreased mobility.  No sign and symptom of DVT.  No suggestion of bony fracture, patient weightbearing.  Pain in left knee and ankle possibly secondary to changed gait given recent injury.   Start Voltaren gel 4 times daily as needed on left leg/ knee/ankle for pain.   Stop Excedrin and tylenol ( acetaminophen) 650 mg 2 capsule twice daily as needed for pain.  Elevate leg.  Can use cane for balance.  Start home stretching exercises... Info given.   Primary hypertension -     amLODIPine Besylate; Take 1 tablet (10 mg total) by mouth daily.  Dispense: 90  tablet; Refill: 3  Other orders -     Montelukast Sodium; TAKE ONE TABLET BY MOUTH ONCE DAILY FOR sinuses  Dispense: 90 tablet; Refill: 3 -     Omeprazole; Take 1 capsule (20 mg total) by mouth daily.  Dispense: 90 capsule; Refill: 0    No follow-ups on file.   Kerby Nora, MD

## 2023-08-31 ENCOUNTER — Telehealth: Payer: Self-pay | Admitting: Nurse Practitioner

## 2023-08-31 NOTE — Telephone Encounter (Signed)
Pt called to let Dr. Ermalene Searing know she is still experiencing some leg discomfort. Pt asked for advise on what to do? Pt asked does she ned to be reevaluated? Call back # [phone]

## 2023-08-31 NOTE — Telephone Encounter (Signed)
Please schedule her an appointment with her PCP-Matt Cable for re-evaluation.

## 2023-09-01 NOTE — Telephone Encounter (Signed)
Spoke to pt, scheduled ov for 09/06/23

## 2023-09-03 ENCOUNTER — Telehealth: Payer: Self-pay | Admitting: Nurse Practitioner

## 2023-09-06 ENCOUNTER — Ambulatory Visit
Admission: RE | Admit: 2023-09-06 | Discharge: 2023-09-06 | Disposition: A | Discharge status: Home / Self Care | Payer: Medicare PPO | Source: Ambulatory Visit | Attending: Nurse Practitioner

## 2023-09-06 ENCOUNTER — Ambulatory Visit (INDEPENDENT_AMBULATORY_CARE_PROVIDER_SITE_OTHER): Payer: Medicare PPO | Admitting: Nurse Practitioner

## 2023-09-06 VITALS — BP 120/80 | HR 83 | Temp 98.2°F | Ht 69.0 in | Wt 282.2 lb

## 2023-09-06 DIAGNOSIS — R600 Localized edema: Secondary | ICD-10-CM | POA: Diagnosis not present

## 2023-09-06 DIAGNOSIS — Z6841 Body Mass Index (BMI) 40.0 and over, adult: Secondary | ICD-10-CM | POA: Diagnosis not present

## 2023-09-06 DIAGNOSIS — M79605 Pain in left leg: Secondary | ICD-10-CM

## 2023-09-06 NOTE — Assessment & Plan Note (Signed)
Ambulatory referral to nutritionist.

## 2023-09-06 NOTE — Assessment & Plan Note (Signed)
Ongoing pain will obtain film of the tibia/fibula.  Patient can use over-the-counter analgesics and Voltaren gel.  Also encourage patient to stop using the cane as she ambulated fine without it in office today.

## 2023-09-06 NOTE — Progress Notes (Signed)
Acute Office Visit  Subjective:     Patient ID: Kathy Gamble, female    DOB: [birth year], 47 y.o.   MRN: [id number]  Chief Complaint  Patient presents with   Leg Pain    Pt complains of left leg bruising and dull and deep pain. Pt states that a tv fell on her shin. OTC did not help and Gabapentin doesn't help.      Patient is in today for Leg pain with history of htn, Migraine, obesity  Patient was seen by Hannah Beat, MD on 08/11/2023. She had some left sided leg pain after a tv got dropped on her.   She was again evaluated on 08/20/2023 for on going pain. She was instructed to use voltarne gel, strechting, elelvation, compression garments and tylenol. Patient is here for follow up   States that it has stayed the same. States that she has not re injuried it. States that she has been using excedrin and ibuprofen. States that it has not helped. States that she has been taking gabapentin. States that it jurts more with walking. States that she has been using a cane more often. States that it doe  feel like knife stabbing pain. States that she was wearing a leg brace, states that it did help. It is describe as a compression sleeve    Review of Systems  Constitutional:  Negative for chills and fever.  Respiratory:  Negative for shortness of breath.   Cardiovascular:  Positive for leg swelling. Negative for chest pain.  Musculoskeletal:  Positive for joint pain.  Neurological:  Negative for tingling and weakness.        Objective:    BP 120/80   Pulse 83   Temp 98.2 F (36.8 C) (Temporal)   Ht 5\' 9"  (1.753 m)   Wt 282 lb 3.2 oz (128 kg)   LMP  (LMP Unknown)   SpO2 98%   BMI 41.67 kg/m    Physical Exam Vitals and nursing note reviewed.  Constitutional:      Appearance: Normal appearance.  Cardiovascular:     Rate and Rhythm: Normal rate and regular rhythm.     Pulses:          Dorsalis pedis pulses are 2+ on the left side.       Posterior tibial pulses are 2+ on  the left side.     Heart sounds: Normal heart sounds.  Pulmonary:     Effort: Pulmonary effort is normal.     Breath sounds: Normal breath sounds.  Musculoskeletal:     Right lower leg: 1+ Edema present.     Left lower leg: 2+ Edema present.       Legs:     Comments: Slight tenderness to palpation to the para Tibial area.  Some varicosities noted.  No ecchymosis  No overt erythema.  No calf tenderness to palpation  Neurological:     Mental Status: She is alert.     No results found for any visits on 09/06/23.      Assessment & Plan:   Problem List Items Addressed This Visit       Other   Morbid obesity (HCC)    Ambulatory referral to nutritionist.      Relevant Orders   Amb ref to Medical Nutrition Therapy-MNT   Left leg pain - Primary    Ongoing pain will obtain film of the tibia/fibula.  Patient can use over-the-counter analgesics and Voltaren gel.  Also encourage patient to  stop using the cane as she ambulated fine without it in office today.      Relevant Orders   DG Tibia/Fibula Left    No orders of the defined types were placed in this encounter.   Return if symptoms worsen or fail to improve, for As scheduled .  Audria Nine, NP

## 2023-09-06 NOTE — Telephone Encounter (Signed)
error 

## 2023-09-06 NOTE — Patient Instructions (Addendum)
Nice to see you today I will be in touch with the xray once I have reviewed it Follow up with me in December as scheduled   Continue using over the counter pain medication like voltaren

## 2023-09-16 ENCOUNTER — Telehealth: Payer: Self-pay | Admitting: Nurse Practitioner

## 2023-09-16 DIAGNOSIS — J302 Other seasonal allergic rhinitis: Secondary | ICD-10-CM

## 2023-09-16 NOTE — Telephone Encounter (Signed)
Prescription Request  09/16/2023  LOV: 09/06/2023  What is the name of the medication or equipment? albuterol (VENTOLIN HFA) 108 (90 Base) MCG/ACT inhaler  Have you contacted your pharmacy to request a refill? No   Which pharmacy would you like this sent to?  [id number] - [place], [place] - [address] AT [facility] [address] [facility] [address] [place] [address] Phone: [phone] Fax: [phone]    Patient notified that their request is being sent to the clinical staff for review and that they should receive a response within 2 business days.   Please advise at Mobile [phone] (mobile)

## 2023-09-17 NOTE — Telephone Encounter (Signed)
LAST APPOINTMENT DATE: 09/06/2023   NEXT APPOINTMENT DATE: 11/22/2023    LAST REFILL: 06/16/23  QTY: 8.5g 0RF

## 2023-09-19 MED ORDER — ALBUTEROL SULFATE HFA 108 (90 BASE) MCG/ACT IN AERS
INHALATION_SPRAY | RESPIRATORY_TRACT | 0 refills | Status: DC
Start: 2023-09-19 — End: 2023-10-18

## 2023-09-19 NOTE — Addendum Note (Signed)
Addended by: Eden Emms on: 09/19/2023 05:49 PM   Modules accepted: Orders

## 2023-09-20 ENCOUNTER — Telehealth: Payer: Self-pay | Admitting: Nurse Practitioner

## 2023-09-20 NOTE — Telephone Encounter (Signed)
Patient contacted the office regarding xray results, asked if she could have someone call her regarding xray results. States she is not abe to get into [place]. Patient also says she is having some swelling in her ankles, and wanted to speak with someone regarding this

## 2023-09-20 NOTE — Telephone Encounter (Signed)
Left voicemail for patient to call the office back.   

## 2023-09-21 DIAGNOSIS — G479 Sleep disorder, unspecified: Secondary | ICD-10-CM | POA: Diagnosis not present

## 2023-09-21 DIAGNOSIS — R519 Headache, unspecified: Secondary | ICD-10-CM | POA: Diagnosis not present

## 2023-09-21 DIAGNOSIS — M542 Cervicalgia: Secondary | ICD-10-CM | POA: Diagnosis not present

## 2023-09-22 NOTE — Telephone Encounter (Signed)
Contacted pt and informed her that we are still waiting for xray results. Contacted [facility] and was told she will be moved up the list.

## 2023-09-22 NOTE — Telephone Encounter (Signed)
Pt called back. Spoke with and informed pt that her imaging results have not been reviewed and that she will receive a phone call when the results are ready.   Pt states that her ankle is swelling and has been wearing compression socks to help.

## 2023-09-22 NOTE — Telephone Encounter (Signed)
Contacted pt. No answer. Left VM to call office back.   If patient calls back please relay information that her lab results have not been released or reviewed by PCP yet.

## 2023-09-22 NOTE — Telephone Encounter (Signed)
Can we call and see if they can read the xray as it has been since 09/06/2023

## 2023-09-22 NOTE — Telephone Encounter (Signed)
Patient returned call regarding xray results and swelling in legs. Would like a call back.

## 2023-09-27 ENCOUNTER — Encounter: Payer: Self-pay | Admitting: Dietician

## 2023-09-27 ENCOUNTER — Encounter: Payer: Medicare PPO | Attending: Nurse Practitioner | Admitting: Dietician

## 2023-09-27 VITALS — Ht 69.0 in | Wt 280.0 lb

## 2023-09-27 DIAGNOSIS — E669 Obesity, unspecified: Secondary | ICD-10-CM

## 2023-09-27 DIAGNOSIS — Z713 Dietary counseling and surveillance: Secondary | ICD-10-CM | POA: Diagnosis not present

## 2023-09-27 DIAGNOSIS — Z6841 Body Mass Index (BMI) 40.0 and over, adult: Secondary | ICD-10-CM | POA: Insufficient documentation

## 2023-09-27 NOTE — Patient Instructions (Addendum)
Begin to build your meals using the proportions of the Balanced Plate. First, select your carb choice(s) for the meal. Make this 25% of your meal. Next, select your source of protein to pair with your carb choice(s). Make this another 25% of your meal. Finally, complete your meal with a variety of non-starchy vegetables. Make this the remaining 50% of your meal.  When having salads, have a small cup of dressing on the side and dip your bites of salad into it instead of pouring dressing all over it.  Talk to your doctor about seeing a cardiologist for your lower leg swelling/low energy.  When shopping for packaged foods, look for low sodium options. Aim for 140 mg or less of sodium per serving! Moderate your added salt to your meals, choose salt free seasonings.

## 2023-09-27 NOTE — Progress Notes (Signed)
Medical Nutrition Therapy  Appointment Start time:  [phone]  Appointment End time:  0955 20 minutes late, abridged appointment  Primary concerns today: Weight  Referral diagnosis: E66.01 - Morbid obesity Preferred learning style: No preference indicated Learning readiness: Not ready   NUTRITION ASSESSMENT   Anthropometirics: Ht: 69" Wt: 280 lbs BMI: 41.35 kg/m2  Wt Change: -6 lbs x60 days   Clinical Medical Hx: Migraines, HTN, GERD Medications: Amlodipine, Omeprazole, Losartan, Gabapentin Labs: Reviewed Notable Signs/Symptoms: Scattered thoughts   Lifestyle & Dietary Hx Pt reports wanting to drink more water to be healthy, pt reports drinking water (3 large cups in the morning), drinking ZERO/diet sodas, taking MVI, walking to mailbox and back daily, doing exercise in the house. Pt reports trying to eat less sweets, states it is difficult to stay away. Pt states they eat when they are hungry, but usually has breakfast lunch and dinner daily. Pt reports husband is diabetic, states they both want to make changes to improve health. Pt reports low SES, relies on food banks/churches for subsidized foods.    Estimated daily fluid intake: <64 oz Supplements: MVI Sleep: Poor quality, uncomfortable bed Stress / self-care: Mild Current average weekly physical activity: 30-40 minutes,    24-Hr Dietary Recall First Meal: Breakfast Croissant Snack:  Second Meal: Some type of vegetables/fruits Snack:  Third Meal:  Snack: Mixed nuts Beverages: Water, diet drinks    NUTRITION DIAGNOSIS  NB-1.1 Food and nutrition-related knowledge deficit As related to obesity.  As evidenced by BMI of 41.35 kg/m2, inability to provide dietary recall.   NUTRITION INTERVENTION  Nutrition education (E-1) on the following topics:  Educated patient on the balanced plate eating model. Recommended lunch and dinner be 1/2 non-starchy vegetables, 1/4 starches, and 1/4 protein. Recommended breakfast be a  balance of starch and protein with a piece of fruit. Discussed with patient the importance of working towards hitting the proportions of the balanced plate consistently.    Handouts Provided Include  Food Assistance Resources Balanced Plate  Learning Style & Readiness for Change Teaching method utilized: Visual & Auditory  Demonstrated degree of understanding via: Teach Back  Barriers to learning/adherence to lifestyle change: Cognitive state/ADHD  Goals Established by Pt Begin to build your meals using the proportions of the Balanced Plate. First, select your carb choice(s) for the meal. Make this 25% of your meal. Next, select your source of protein to pair with your carb choice(s). Make this another 25% of your meal. Finally, complete your meal with a variety of non-starchy vegetables. Make this the remaining 50% of your meal. When having salads, have a small cup of dressing on the side and dip your bites of salad into it instead of pouring dressing all over it. Talk to your doctor about seeing a cardiologist for your lower leg swelling/low energy. When shopping for packaged foods, look for low sodium options. Aim for 140 mg or less of sodium per serving! Moderate your added salt to your meals, choose salt free seasonings.   MONITORING & EVALUATION Dietary intake, weekly physical activity, and weight change PRN.  Next Steps  Patient is to call to schedule follow up PRN

## 2023-10-01 ENCOUNTER — Encounter: Payer: Self-pay | Admitting: Nurse Practitioner

## 2023-10-01 ENCOUNTER — Ambulatory Visit (INDEPENDENT_AMBULATORY_CARE_PROVIDER_SITE_OTHER): Payer: Medicare PPO | Admitting: Nurse Practitioner

## 2023-10-01 VITALS — BP 132/88 | HR 86 | Temp 97.5°F | Ht 69.0 in | Wt 283.4 lb

## 2023-10-01 DIAGNOSIS — W19XXXA Unspecified fall, initial encounter: Secondary | ICD-10-CM | POA: Diagnosis not present

## 2023-10-01 DIAGNOSIS — T148XXA Other injury of unspecified body region, initial encounter: Secondary | ICD-10-CM | POA: Diagnosis not present

## 2023-10-01 NOTE — Assessment & Plan Note (Signed)
Patient is a poor historian but fell in the closet.  She just waited to be checked out.  Denies LOC.  States she hit her head but not having any new headaches outside of her migraines better documented

## 2023-10-01 NOTE — Progress Notes (Signed)
Acute Office Visit  Subjective:     Patient ID: Kathy Gamble, female    DOB: 1976/01/02, 47 y.o.   MRN: [id number]  Chief Complaint  Patient presents with   Fall    Pt complains of multiple bruises on arm and legs. Pt states she just want to make sure nothing is broken. Pain level 8.      Patient is in today for fall with a history of HTN, mirgrinaes, [place], GERD,  States that there was a closet was in her room. States that she did not realize that she fell and did not brace herself with her hands. States that her hsuband and son had to help her up. States that she thinks she fell last Friday. States that she has bruises all over and she wants to make sure she nothing is broke. States that she has brusies to the right upper arm. Bilateral lower upper legs. States that she did hit her head but no change in her normla documented headaches. States that she has been taking excedrin and ibuprofen. States that it has not helped and hurts when you push on them.      Review of Systems  Constitutional:  Negative for chills and fever.  Respiratory:  Negative for shortness of breath.   Cardiovascular:  Negative for chest pain.  Musculoskeletal:  Positive for falls.  Neurological:  Negative for headaches (outside of baseline).  Psychiatric/Behavioral:  Negative for hallucinations and suicidal ideas.         Objective:    BP 132/88   Pulse 86   Temp (!) 97.5 F (36.4 C) (Oral)   Ht 5\' 9"  (1.753 m)   Wt 283 lb 6.4 oz (128.5 kg)   LMP  (LMP Unknown)   SpO2 96%   BMI 41.85 kg/m  BP Readings from Last 3 Encounters:  10/01/23 132/88  09/06/23 120/80  08/20/23 110/80   Wt Readings from Last 3 Encounters:  10/01/23 283 lb 6.4 oz (128.5 kg)  09/27/23 280 lb (127 kg)  09/06/23 282 lb 3.2 oz (128 kg)      Physical Exam Vitals and nursing note reviewed.  Constitutional:      Appearance: Normal appearance.  HENT:     Mouth/Throat:     Mouth: Mucous membranes are moist.      Pharynx: Oropharynx is clear.  Eyes:     Extraocular Movements: Extraocular movements intact.     Pupils: Pupils are equal, round, and reactive to light.  Cardiovascular:     Rate and Rhythm: Normal rate and regular rhythm.     Heart sounds: Normal heart sounds.  Pulmonary:     Effort: Pulmonary effort is normal.     Breath sounds: Normal breath sounds.  Musculoskeletal:     Right lower leg: Edema present.     Left lower leg: Edema present.  Skin:    Findings: Bruising present.       Neurological:     General: No focal deficit present.     Mental Status: She is alert.     Comments: Bilateral upper and lower extremity strength 5/5     No results found for any visits on 10/01/23.      Assessment & Plan:   Problem List Items Addressed This Visit       Other   Fall - Primary    Patient is a poor historian but fell in the closet.  She just waited to be checked out.  Denies LOC.  States she hit her head but not having any new headaches outside of her migraines better documented      Bruising    Patient has several spots of bruising as healing.  Informed patient it can take several weeks to resolve completely she is over-the-counter analgesics like Tylenol as needed.  Patient can also use ice as needed for relief.       No orders of the defined types were placed in this encounter.   Return if symptoms worsen or fail to improve, for As schedueld .  Audria Nine, NP

## 2023-10-01 NOTE — Patient Instructions (Addendum)
Nice to see you today It is ok to use over the counter tylenol for the pain  You can use ice on the spots. 20 mins and then at least 20 mins without ice Follow up with me as scheduled

## 2023-10-01 NOTE — Assessment & Plan Note (Signed)
Patient has several spots of bruising as healing.  Informed patient it can take several weeks to resolve completely she is over-the-counter analgesics like Tylenol as needed.  Patient can also use ice as needed for relief.

## 2023-10-11 ENCOUNTER — Telehealth: Payer: Self-pay | Admitting: Nurse Practitioner

## 2023-10-11 NOTE — Telephone Encounter (Signed)
She can call and be seen at [facility] in [place]. Their number is [phone]. Address s [address] [place], [place]  She has a history of migraines and is followed by neurology. She can reach out to them in regards to her headaches

## 2023-10-11 NOTE — Telephone Encounter (Signed)
Contacted pt and relayed information. Pt repeated [facility] phone number back to me. Pt agreed to follow up with neurologist.

## 2023-10-11 NOTE — Telephone Encounter (Signed)
Patient called in and was wanting to know if she could be referred to someone else beside the dietitian. She stated that it isn't helping her much. She also wanted to know if there was a particular reason she is having frequent headaches. She stated that she has discussed both of these with Matt. Thank you!

## 2023-10-15 ENCOUNTER — Telehealth: Payer: Self-pay | Admitting: Nurse Practitioner

## 2023-10-15 NOTE — Telephone Encounter (Signed)
Received fax from pharmacy requesting refill on Albuterol. This was just given on 10/6. Want to verify patient requested this refill. If so how often she is using.   Left message for patient to call office. Will send my chart with questions as well.

## 2023-10-15 NOTE — Telephone Encounter (Signed)
Left message on patients voicemail that rx was sent to [facility] on 09/19/23.

## 2023-10-15 NOTE — Telephone Encounter (Signed)
Spoke to pt, pt states [facility] closed down & she wasn't sure who to get to refill inhaler. Pt states she got [facility] to request refill. Call back # [phone]

## 2023-10-15 NOTE — Telephone Encounter (Signed)
I sent it to the [facility] on 09/19/2023

## 2023-10-18 ENCOUNTER — Telehealth: Payer: Self-pay

## 2023-10-18 DIAGNOSIS — J302 Other seasonal allergic rhinitis: Secondary | ICD-10-CM

## 2023-10-18 MED ORDER — ALBUTEROL SULFATE HFA 108 (90 BASE) MCG/ACT IN AERS: INHALATION_SPRAY | RESPIRATORY_TRACT | 0 refills | Status: DC

## 2023-10-18 NOTE — Addendum Note (Signed)
Addended by: Eden Emms on: 10/18/2023 03:28 PM   Modules accepted: Orders

## 2023-10-18 NOTE — Telephone Encounter (Signed)
Refill provided

## 2023-10-18 NOTE — Telephone Encounter (Signed)
LAST APPOINTMENT DATE: 10/01/2023   NEXT APPOINTMENT DATE: 11/22/2023  Albuterol inhaler   LAST REFILL: 09/19/2023  QTY: #8.5g 0RF

## 2023-11-03 ENCOUNTER — Ambulatory Visit: Payer: Medicare PPO | Admitting: Nurse Practitioner

## 2023-11-05 ENCOUNTER — Ambulatory Visit (INDEPENDENT_AMBULATORY_CARE_PROVIDER_SITE_OTHER): Payer: Medicare PPO | Admitting: Internal Medicine

## 2023-11-05 ENCOUNTER — Ambulatory Visit: Payer: Medicare PPO | Admitting: Nurse Practitioner

## 2023-11-05 ENCOUNTER — Telehealth: Payer: Self-pay

## 2023-11-05 ENCOUNTER — Encounter: Payer: Self-pay | Admitting: Internal Medicine

## 2023-11-05 VITALS — BP 118/88 | HR 90 | Temp 97.9°F | Ht 69.0 in | Wt 283.0 lb

## 2023-11-05 DIAGNOSIS — I10 Essential (primary) hypertension: Secondary | ICD-10-CM

## 2023-11-05 DIAGNOSIS — K219 Gastro-esophageal reflux disease without esophagitis: Secondary | ICD-10-CM

## 2023-11-05 DIAGNOSIS — J0111 Acute recurrent frontal sinusitis: Secondary | ICD-10-CM | POA: Insufficient documentation

## 2023-11-05 MED ORDER — AMOXICILLIN-POT CLAVULANATE 875-125 MG PO TABS: 1.0000 | ORAL_TABLET | Freq: Two times a day (BID) | ORAL | 1 refills | Status: DC

## 2023-11-05 MED ORDER — WEGOVY 0.25 MG/0.5ML ~~LOC~~ SOAJ: 0.2500 mg | SUBCUTANEOUS | 0 refills | Status: DC

## 2023-11-05 NOTE — Addendum Note (Signed)
Addended by: Melina Copa on: 11/05/2023 12:22 PM   Modules accepted: Orders

## 2023-11-05 NOTE — Telephone Encounter (Signed)
I am ok sending to new pharmacy

## 2023-11-05 NOTE — Telephone Encounter (Signed)
Rx sent to [facility]

## 2023-11-05 NOTE — Progress Notes (Signed)
Subjective:    Patient ID: Kathy Gamble, female    DOB: [birth year], 47 y.o.   MRN: [id number]  HPI Here due to headache and cough  Having mucus and spitting it up Dark stuff--"that is clogging me up"---post nasal drip Goes back 2 weeks No fever No sore throat--but using cough drops Having some sense of SOB---when in her trailer (feels closed in) Some ear pressure and frontal headache (like in both eyes)  Takes gabapentin No analgesics on advice of Dr Malvin Johns  Current Outpatient Medications on File Prior to Visit  Medication Sig Dispense Refill   albuterol (VENTOLIN HFA) 108 (90 Base) MCG/ACT inhaler Inhale 1-2 puffs into the lungs every 6 hours as needed for wheezing and or shortness of breah. 8.5 g 0   amLODipine (NORVASC) 10 MG tablet Take 1 tablet (10 mg total) by mouth daily. 90 tablet 3   fluticasone (FLONASE) 50 MCG/ACT nasal spray INSTILL 1 SPRAY IN EACH NOSTRIL ONCE DAILY 16 g 0   gabapentin (NEURONTIN) 400 MG capsule Take 800 mg by mouth 2 (two) times daily.     levocetirizine (XYZAL) 5 MG tablet Take 1 tablet (5 mg total) by mouth every evening. 90 tablet 2   losartan (COZAAR) 25 MG tablet TAKE ONE TABLET BY MOUTH ONCE DAILY 90 tablet 1   montelukast (SINGULAIR) 10 MG tablet TAKE ONE TABLET BY MOUTH ONCE DAILY FOR sinuses 90 tablet 3   Multiple Vitamin (MULTIVITAMIN) capsule Take 1 capsule by mouth daily.     nortriptyline (PAMELOR) 10 MG capsule Take 20 mg by mouth at bedtime.     omeprazole (PRILOSEC) 20 MG capsule Take 1 capsule (20 mg total) by mouth daily. 90 capsule 0   tiZANidine (ZANAFLEX) 4 MG tablet Take 1 tablet (4 mg total) by mouth at bedtime as needed for muscle spasms. 14 tablet 0   venlafaxine (EFFEXOR) 75 MG tablet Take 75 mg by mouth at bedtime.     verapamil (CALAN) 40 MG tablet Take 1 tablet by mouth 2 (two) times daily.     zonisamide (ZONEGRAN) 100 MG capsule Take 100 mg by mouth at bedtime.     Semaglutide-Weight Management (WEGOVY) 0.25 MG/0.5ML  SOAJ Inject 0.25 mg into the skin once a week. (Patient not taking: Reported on 11/05/2023) 2 mL 0   No current facility-administered medications on file prior to visit.    Allergies  Allergen Reactions   Bee Venom Itching   Nortriptyline Other (See Comments)    GI distress   Latex Rash    The powder in the gloves causes rash    Past Medical History:  Diagnosis Date   Allergy    Bee sting allergy 03/02/2015   Chicken pox    Frequent headaches    GERD (gastroesophageal reflux disease)     Past Surgical History:  Procedure Laterality Date   ADENOIDECTOMY     TONSILLECTOMY      Family History  Problem Relation Age of Onset   Stroke Mother    Mental retardation Mother    Cancer Maternal Grandmother    Cancer Paternal Grandmother        Breast   Heart disease Paternal Grandfather     Social History   Socioeconomic History   Marital status: Married    Spouse name: Not on file   Number of children: Not on file   Years of education: Not on file   Highest education level: Not on file  Occupational History   Occupation:  disabled  Tobacco Use   Smoking status: Never   Smokeless tobacco: Never  Vaping Use   Vaping status: Never Used  Substance and Sexual Activity   Alcohol use: No    Alcohol/week: 0.0 standard drinks of alcohol   Drug use: Not Currently    Types: Cocaine   Sexual activity: Yes    Birth control/protection: None  Other Topics Concern   Not on file  Social History Narrative   Cognitive dysfunction - lives with her husband   Social Determinants of Health   Financial Resource Strain: Low Risk  (04/21/2023)   Overall Financial Resource Strain (CARDIA)    Difficulty of Paying Living Expenses: Not hard at all  Food Insecurity: No Food Insecurity (04/21/2023)   Hunger Vital Sign    Worried About Running Out of Food in the Last Year: Never true    Ran Out of Food in the Last Year: Never true  Transportation Needs: No Transportation Needs (04/21/2023)    PRAPARE - Administrator, Civil Service (Medical): No    Lack of Transportation (Non-Medical): No  Physical Activity: Insufficiently Active (04/21/2023)   Exercise Vital Sign    Days of Exercise per Week: 2 days    Minutes of Exercise per Session: 20 min  Stress: No Stress Concern Present (04/21/2023)   [employer] of Occupational Health - Occupational Stress Questionnaire    Feeling of Stress : Not at all  Social Connections: Moderately Integrated (04/21/2023)   Social Connection and Isolation Panel [NHANES]    Frequency of Communication with Friends and Family: More than three times a week    Frequency of Social Gatherings with Friends and Family: More than three times a week    Attends Religious Services: More than 4 times per year    Active Member of [employer] or Organizations: No    Attends Banker Meetings: Never    Marital Status: Married  Catering manager Violence: Not At Risk (04/21/2023)   Humiliation, Afraid, Rape, and Kick questionnaire    Fear of Current or Ex-Partner: No    Emotionally Abused: No    Physically Abused: No    Sexually Abused: No   Review of Systems No N/V Eating okay--but some trouble swallowing stuff Some heartburn despite the omeprazole---uses tums prn     Objective:   Physical Exam Constitutional:      Appearance: Normal appearance.  HENT:     Head:     Comments: Mild frontal tenderness    Right Ear: Ear canal normal.     Left Ear: Ear canal normal.     Ears:     Comments: TMs dull with scarring---but not inflamed    Mouth/Throat:     Pharynx: No oropharyngeal exudate or posterior oropharyngeal erythema.  Pulmonary:     Effort: Pulmonary effort is normal.     Breath sounds: Normal breath sounds. No wheezing or rales.  Musculoskeletal:     Cervical back: Neck supple.  Lymphadenopathy:     Cervical: No cervical adenopathy.  Neurological:     Mental Status: She is alert.            Assessment & Plan:

## 2023-11-05 NOTE — Assessment & Plan Note (Signed)
Not sure if she is ragweed sensitive Has sense of being closed in in trailer--has some water leakage (discussed that it may need to be checked for mold) Will treat again with augmentin 875 bid x 1 week (with refill)

## 2023-11-05 NOTE — Telephone Encounter (Signed)
Pt was in to see Dr Alphonsus Sias this morning. She did not start the Scnetx that was sent to [facility] 07/27/23. They did close close to that time. States she never received them. States she would like to take them. Asked if she needs to come back in to see Audria Nine, NP or can it be sent to her new pharmacy?

## 2023-11-08 ENCOUNTER — Telehealth: Payer: Self-pay

## 2023-11-09 ENCOUNTER — Other Ambulatory Visit (HOSPITAL_COMMUNITY): Payer: Self-pay

## 2023-11-09 NOTE — Telephone Encounter (Signed)
We can inform the patient that the medication is not covered for weight loss under her isurance

## 2023-11-09 NOTE — Telephone Encounter (Signed)
Contacted pt. Relayed information of insurance not covering [phone] for weight loss. Pt verbalized understanding and will contact her insurance company for coverage details.

## 2023-11-15 ENCOUNTER — Other Ambulatory Visit (HOSPITAL_COMMUNITY): Payer: Self-pay

## 2023-11-18 DIAGNOSIS — R200 Anesthesia of skin: Secondary | ICD-10-CM | POA: Diagnosis not present

## 2023-11-18 DIAGNOSIS — M542 Cervicalgia: Secondary | ICD-10-CM | POA: Diagnosis not present

## 2023-11-18 DIAGNOSIS — T50905D Adverse effect of unspecified drugs, medicaments and biological substances, subsequent encounter: Secondary | ICD-10-CM | POA: Diagnosis not present

## 2023-11-18 DIAGNOSIS — F411 Generalized anxiety disorder: Secondary | ICD-10-CM | POA: Diagnosis not present

## 2023-11-18 DIAGNOSIS — G479 Sleep disorder, unspecified: Secondary | ICD-10-CM | POA: Diagnosis not present

## 2023-11-18 DIAGNOSIS — R202 Paresthesia of skin: Secondary | ICD-10-CM | POA: Diagnosis not present

## 2023-11-18 DIAGNOSIS — G35 Multiple sclerosis: Secondary | ICD-10-CM | POA: Diagnosis not present

## 2023-11-18 DIAGNOSIS — R519 Headache, unspecified: Secondary | ICD-10-CM | POA: Diagnosis not present

## 2023-11-19 ENCOUNTER — Telehealth: Payer: Self-pay | Admitting: Nurse Practitioner

## 2023-11-19 NOTE — Telephone Encounter (Signed)
Pt called in requesting for her omeprazole (PRILOSEC) 20 MG capsule, be increased to a higher dosage? Pt states whenever she eats, when attempting to swallow, she has to chew her food a lot more than normal, for the food to digest well. Pt mentioned her neurologist discontinued her Nortriptyline 10mg , due to her constantly having a runny stool. Pt states she is also dealing with a cough & wanted to ask if she should take cough syrup? Pt states her ankles are swollen & she has been exercising more & has increased her water intake. Pt asked should she take ibuprofen for the swelling? Call back # [phone]

## 2023-11-19 NOTE — Telephone Encounter (Signed)
Omeprazole will not help with her being able to swallow the food.  It is up to her if she wants to take cough medication Ibuprofen will not help with the swelling so I would not take it for that reason

## 2023-11-22 ENCOUNTER — Encounter: Payer: Medicare PPO | Admitting: Nurse Practitioner

## 2023-11-22 NOTE — Telephone Encounter (Signed)
Patient called in returning call she received. Relayed message below to her. She stated that she has been taking Delsym to help with her cough and she has been using cough drops and vitamin C candy. Informed her that Ibuprofen will not help with the swelling.

## 2023-11-22 NOTE — Telephone Encounter (Signed)
Left voicemail for patient to call the office back.   

## 2023-11-23 ENCOUNTER — Encounter: Payer: Self-pay | Admitting: Nurse Practitioner

## 2023-11-26 ENCOUNTER — Telehealth: Payer: Self-pay | Admitting: Nurse Practitioner

## 2023-11-26 NOTE — Telephone Encounter (Signed)
Patient called in and stated that she has red spots all over. She wanted to know if it could be an allergic reaction to something. Informed her she will need to be seen in order to know for sure what it is. She was wanting to know if she could take Benadryl. Please advise. Thank you!

## 2023-11-26 NOTE — Telephone Encounter (Signed)
She has levo cetirizine on her medication list: Verify whether she is taking this medication or not.  If she is not she can try taking that to see if that helps.  If she does not have any that medication she can take Benadryl as directed on the bottle.  In order to have a diagnosis or see what the rash is doing she will need to be seen in office

## 2023-11-27 ENCOUNTER — Other Ambulatory Visit: Payer: Self-pay | Admitting: Nurse Practitioner

## 2023-11-29 ENCOUNTER — Telehealth: Payer: Self-pay | Admitting: Nurse Practitioner

## 2023-11-29 NOTE — Telephone Encounter (Signed)
I spoke with pt; for 3 wks pt has had swelling in both legs from thigh to feet. There is numbness on and off both legs and feet and pain level now is in both legs and feet. No redness noted. Pt having some trouble walking due to pain and swelling in legs. Pt said she tries to keep feet up when sitting but not always successful. Pt wears compression socks on and off. Pt said she does not take diuretic but pt is taking her BP meds and has not missed any meds.pt does not have a way to ck BP.pt said the swelling in legs and feet does not go down overnight. No CP or SOB but after walking sometimes pt wheezes and inhaler helps little bit. Pt has prod cough with yellow and blood tinged phlegm. Pt does not have fever.pt said she does not need to go to [facility] or ED and wants to keep appt already scheduled with Dr Ermalene Searing on 02/28/23 at 2:40. UC & ED precautions given and pt voiced understanding. Sending note to Dr Ermalene Searing and Audria Nine as FYI to pcp.

## 2023-11-29 NOTE — Telephone Encounter (Signed)
Called patient states that she feels like it is "pretty much gone" she was advised of information states she never received the Xyzal. Advised patient it was called in in September per chart. Reviewed spelling of brand and generic names. Patient states she does not have. She will get from pharmacy and see if it helps. She has tried benadryl and didn't have any improvement. Patient advised that if it does not continue to go away, comes back or has any new symptoms she will need to be seen in office. Patient agreed she will call If any changes.

## 2023-11-29 NOTE — Telephone Encounter (Signed)
.........  FYI: This call has been transferred to Access Nurse. Once the result note has been entered staff can address the message at that time.  Patient called in with the following symptoms:  Red Word: numbness in feet    Please advise at Mobile [phone] (mobile)  Message is routed to Provider Pool and [facility] Triage   Pt called in stating she has numbness & pain in both feet. Pt states the issue has been going on within the last week or two. No other symptoms. Scheduled pt with Dr. Ermalene Searing on tomorrow, 12/17 @ 2:40pm. Transferring pt to [facility] for triage. Sending note to bedsool pool as well.

## 2023-11-29 NOTE — Telephone Encounter (Signed)
Noted  

## 2023-11-30 ENCOUNTER — Encounter: Payer: Self-pay | Admitting: Family Medicine

## 2023-11-30 ENCOUNTER — Telehealth: Payer: Self-pay | Admitting: Nurse Practitioner

## 2023-11-30 ENCOUNTER — Ambulatory Visit (INDEPENDENT_AMBULATORY_CARE_PROVIDER_SITE_OTHER): Payer: Medicare PPO | Admitting: Family Medicine

## 2023-11-30 VITALS — BP 110/84 | HR 82 | Temp 97.8°F | Ht 69.0 in | Wt 283.2 lb

## 2023-11-30 DIAGNOSIS — G629 Polyneuropathy, unspecified: Secondary | ICD-10-CM | POA: Diagnosis not present

## 2023-11-30 DIAGNOSIS — M79672 Pain in left foot: Secondary | ICD-10-CM | POA: Diagnosis not present

## 2023-11-30 DIAGNOSIS — M79671 Pain in right foot: Secondary | ICD-10-CM | POA: Diagnosis not present

## 2023-11-30 MED ORDER — GABAPENTIN 400 MG PO CAPS: 800.0000 mg | ORAL_CAPSULE | Freq: Every day | ORAL | 1 refills | Status: DC

## 2023-11-30 NOTE — Assessment & Plan Note (Signed)
Chronic issue On exam sensation is normal bilaterally but she describes numbness as well as electric like pain in both dorsal feet and ankles not really in soles of feet. Of note she is no longer taking gabapentin which was used for migraines but this may have also been helping with neuropathic foot pain. No clear sign of new low back issue. Will restart gabapentin 400 mg p.o. nightly She will follow-up with PCP for further evaluation including possible labs for B12, glucose and electrolytes if not improving as expected. Ibuprofen

## 2023-11-30 NOTE — Progress Notes (Signed)
Patient ID: Kathy Gamble, female    DOB: [birth year], 47 y.o.   MRN: [id number]  This visit was conducted in person.  BP 110/84 (BP Location: Left Arm, Patient Position: Sitting, Cuff Size: Large)   Pulse 82   Temp 97.8 F (36.6 C) (Temporal)   Ht 5\' 9"  (1.753 m)   Wt 283 lb 4 oz (128.5 kg)   LMP  (LMP Unknown)   SpO2 97%   BMI 41.83 kg/m    CC:  Chief Complaint  Patient presents with   Foot Pain    Bilateral but left is worse-Patient states some better today    Subjective:   HPI: Kathy Gamble is a 47 y.o. female patient of [facility] with history of hypertension, chronic neck and back pain presenting on 11/30/2023 for Foot Pain (Bilateral but left is worse-Patient states some better today)  She reports bilateral foot/ankle pain ongoing for 1 month.  Describes as numbness and electricity pain.  She states  it was 10/10 on pain scale.   No low back pain... but points to occ pain in mid back  She was on gabapentin 400 mg twice daily ... She is out of this.. not sure why it was stopped. She is also on nortriptyline 20 mg p.o. nightly     Wt Readings from Last 3 Encounters:  11/30/23 283 lb 4 oz (128.5 kg)  11/05/23 283 lb (128.4 kg)  10/01/23 283 lb 6.4 oz (128.5 kg)    Relevant past medical, surgical, family and social history reviewed and updated as indicated. Interim medical history since our last visit reviewed. Allergies and medications reviewed and updated. Outpatient Medications Prior to Visit  Medication Sig Dispense Refill   albuterol (VENTOLIN HFA) 108 (90 Base) MCG/ACT inhaler Inhale 1-2 puffs into the lungs every 6 hours as needed for wheezing and or shortness of breah. 8.5 g 0   amLODipine (NORVASC) 10 MG tablet Take 1 tablet (10 mg total) by mouth daily. 90 tablet 3   fluticasone (FLONASE) 50 MCG/ACT nasal spray INSTILL 1 SPRAY IN EACH NOSTRIL ONCE DAILY 16 g 0   levocetirizine (XYZAL) 5 MG tablet Take 1 tablet (5 mg total) by mouth every evening. 90  tablet 2   losartan (COZAAR) 25 MG tablet TAKE ONE TABLET BY MOUTH ONCE DAILY 90 tablet 1   methylPREDNISolone (MEDROL) 4 MG tablet Day one - 6 tab Day two - 5 tab Day three - 4 tabs Day four - 3 tabs Day five - 2 tabs Day six - 1 tab     montelukast (SINGULAIR) 10 MG tablet TAKE ONE TABLET BY MOUTH ONCE DAILY FOR sinuses 90 tablet 3   Multiple Vitamin (MULTIVITAMIN) capsule Take 1 capsule by mouth daily.     nortriptyline (PAMELOR) 10 MG capsule Take 20 mg by mouth at bedtime.     omeprazole (PRILOSEC) 20 MG capsule Take 1 capsule (20 mg total) by mouth daily. 90 capsule 0   Semaglutide-Weight Management (WEGOVY) 0.25 MG/0.5ML SOAJ Inject 0.25 mg into the skin once a week. 2 mL 0   tiZANidine (ZANAFLEX) 4 MG tablet Take 1 tablet (4 mg total) by mouth at bedtime as needed for muscle spasms. 14 tablet 0   venlafaxine (EFFEXOR) 75 MG tablet Take 75 mg by mouth at bedtime.     verapamil (CALAN) 40 MG tablet Take 1 tablet by mouth 2 (two) times daily.     zonisamide (ZONEGRAN) 100 MG capsule Take 100 mg by mouth  at bedtime.     gabapentin (NEURONTIN) 400 MG capsule Take 800 mg by mouth 2 (two) times daily.     amoxicillin-clavulanate (AUGMENTIN) 875-125 MG tablet Take 1 tablet by mouth 2 (two) times daily. 14 tablet 1   No facility-administered medications prior to visit.     Per HPI unless specifically indicated in ROS section below Review of Systems  Constitutional:  Negative for fatigue and fever.  HENT:  Negative for congestion.   Eyes:  Negative for pain.  Respiratory:  Negative for cough and shortness of breath.   Cardiovascular:  Negative for chest pain, palpitations and leg swelling.  Gastrointestinal:  Negative for abdominal pain.  Genitourinary:  Negative for dysuria and vaginal bleeding.  Musculoskeletal:  Negative for back pain.  Neurological:  Negative for syncope, light-headedness and headaches.  Psychiatric/Behavioral:  Negative for dysphoric mood.    Objective:  BP 110/84  (BP Location: Left Arm, Patient Position: Sitting, Cuff Size: Large)   Pulse 82   Temp 97.8 F (36.6 C) (Temporal)   Ht 5\' 9"  (1.753 m)   Wt 283 lb 4 oz (128.5 kg)   LMP  (LMP Unknown)   SpO2 97%   BMI 41.83 kg/m   Wt Readings from Last 3 Encounters:  11/30/23 283 lb 4 oz (128.5 kg)  11/05/23 283 lb (128.4 kg)  10/01/23 283 lb 6.4 oz (128.5 kg)      Physical Exam Constitutional:      General: She is not in acute distress.    Appearance: Normal appearance. She is well-developed. She is not ill-appearing or toxic-appearing.  HENT:     Head: Normocephalic.     Right Ear: Hearing, tympanic membrane, ear canal and external ear normal. Tympanic membrane is not erythematous, retracted or bulging.     Left Ear: Hearing, tympanic membrane, ear canal and external ear normal. Tympanic membrane is not erythematous, retracted or bulging.     Nose: No mucosal edema or rhinorrhea.     Right Sinus: No maxillary sinus tenderness or frontal sinus tenderness.     Left Sinus: No maxillary sinus tenderness or frontal sinus tenderness.     Mouth/Throat:     Pharynx: Uvula midline.  Eyes:     General: Lids are normal. Lids are everted, no foreign bodies appreciated.     Conjunctiva/sclera: Conjunctivae normal.     Pupils: Pupils are equal, round, and reactive to light.  Neck:     Thyroid: No thyroid mass or thyromegaly.     Vascular: No carotid bruit.     Trachea: Trachea normal.  Cardiovascular:     Rate and Rhythm: Normal rate and regular rhythm.     Pulses: Normal pulses.          Dorsalis pedis pulses are 2+ on the left side.     Heart sounds: Normal heart sounds, S1 normal and S2 normal. No murmur heard.    No friction rub. No gallop.  Pulmonary:     Effort: Pulmonary effort is normal. No tachypnea or respiratory distress.     Breath sounds: Normal breath sounds. No decreased breath sounds, wheezing, rhonchi or rales.  Abdominal:     General: Bowel sounds are normal.     Palpations:  Abdomen is soft.     Tenderness: There is no abdominal tenderness.  Musculoskeletal:     Cervical back: Normal range of motion and neck supple.     Lumbar back: Normal. No tenderness. Normal range of motion. Negative right straight leg  raise test and negative left straight leg raise test.     Right foot: Normal range of motion.     Left foot: Normal range of motion.  Feet:     Right foot:     Skin integrity: Skin integrity normal. No ulcer, blister, skin breakdown, erythema, callus, dry skin or fissure.     Toenail Condition: Right toenails are long.     Left foot:     Skin integrity: Skin integrity normal. No ulcer, blister, skin breakdown, erythema, warmth, callus, dry skin or fissure.     Toenail Condition: Left toenails are long.     Comments:  1 plus nonpitting edema, has bilateral sock line Skin:    General: Skin is warm and dry.     Findings: No rash.  Neurological:     Mental Status: She is alert.  Psychiatric:        Mood and Affect: Mood is not anxious or depressed.        Speech: Speech normal.        Behavior: Behavior normal. Behavior is cooperative.        Thought Content: Thought content normal.        Judgment: Judgment normal.       Results for orders placed or performed in visit on 07/09/23  HM DIABETES EYE EXAM   Collection Time: 07/01/23 12:00 AM  Result Value Ref Range   HM Diabetic Eye Exam No Retinopathy No Retinopathy    Assessment and Plan  Bilateral foot pain Assessment & Plan: Chronic issue On exam sensation is normal bilaterally but she describes numbness as well as electric like pain in both dorsal feet and ankles not really in soles of feet. Of note she is no longer taking gabapentin which was used for migraines but this may have also been helping with neuropathic foot pain. No clear sign of new low back issue. Will restart gabapentin 400 mg p.o. nightly She will follow-up with PCP for further evaluation including possible labs for B12,  glucose and electrolytes if not improving as expected. Ibuprofen    Neuropathy  Other orders -     Gabapentin; Take 2 capsules (800 mg total) by mouth at bedtime.  Dispense: 30 capsule; Refill: 1    No follow-ups on file.   Kerby Nora, MD

## 2023-11-30 NOTE — Telephone Encounter (Signed)
Kathy Gamble was seen by Dr. Ermalene Searing today.  When giving AVS to patient she ask about weight loss.  She is agreeable to referral to [facility].  Will forward to PCP to place referral per Dr. Ermalene Searing.

## 2023-11-30 NOTE — Telephone Encounter (Signed)
Pt's mom is not on pt's DPR. Susie, pt's mother requested for a callback. Cannot contact anyone not on DPR file.

## 2023-11-30 NOTE — Telephone Encounter (Signed)
Pt's mom, Susie, called asking if our office could help the pt with her weight issues? Susie states the pt is over 300lbs & needs a referral to seek ways to loose the weight. Susie states the pt is "slow" & can't think for herself & doesn't sometime address her weight concerns during ov. Susie states she doesn't believe our office is trying to help pt with all her issues. Susie states the pt may need to switch providers, if [employer] do his job as her primary provider. Susie states the pt is "fat mess" & desperately needs help. Susie requested to speak to a nurse. Told Susie that she is not on pt's DPR. Pt has appt with Dr. Ermalene Searing scheduled for today, 12/17 @ 2:40PM. Call back # [phone]

## 2023-11-30 NOTE — Patient Instructions (Addendum)
Restart gabapentin  400 mg po at bedtime for both migraines and foot pain. If not improving follow up with PCP.

## 2023-11-30 NOTE — Telephone Encounter (Signed)
Noted  

## 2023-12-01 NOTE — Telephone Encounter (Signed)
I have discussed this with the patient in the past. She does not need a referral She can call them at [phone] and get an appointment

## 2023-12-01 NOTE — Telephone Encounter (Signed)
Left voicemail for patient to call the office back.   

## 2023-12-03 ENCOUNTER — Telehealth: Payer: Self-pay | Admitting: *Deleted

## 2023-12-03 NOTE — Telephone Encounter (Signed)
Left voicemail for patient to call the office back.   

## 2023-12-03 NOTE — Telephone Encounter (Addendum)
Left voicemail for patient to call the office back regarding [facility].  Please advise.  Pt does not need an appointment.  Contact [facility]: [phone]

## 2023-12-03 NOTE — Telephone Encounter (Signed)
Copied from CRM [id number]. Topic: General - Other >> Dec 03, 2023 12:06 PM Turkey A wrote: Reason for CRM: Patient called because she received a call from the office has called her several times but she was asleep. Patient was returning phone call

## 2023-12-06 NOTE — Telephone Encounter (Signed)
Left voicemail for patient to call the office back.   3x

## 2023-12-09 NOTE — Telephone Encounter (Signed)
Called patient x 3 and sent my chart message with information. Do you want [place] to send letter to home address as well?

## 2023-12-16 ENCOUNTER — Other Ambulatory Visit: Payer: Self-pay | Admitting: Nurse Practitioner

## 2023-12-16 ENCOUNTER — Other Ambulatory Visit: Payer: Self-pay | Admitting: *Deleted

## 2023-12-16 MED ORDER — LEVOCETIRIZINE DIHYDROCHLORIDE 5 MG PO TABS: 5.0000 mg | ORAL_TABLET | Freq: Every evening | ORAL | 2 refills | Status: DC

## 2023-12-16 NOTE — Telephone Encounter (Signed)
 Copied from CRM [id number]. Topic: Clinical - Medication Refill >> Dec 16, 2023  1:15 PM Robinson H wrote: Most Recent Primary Care Visit:  Provider: AVELINA NO E  Department: [facility]  Visit Type: OFFICE VISIT  Date: 11/30/2023  Medication: levocetirizine (XYZAL ) 5 MG tablet  Has the patient contacted their pharmacy? Yes, stated wasn't on patients profile (Agent: If no, request that the patient contact the pharmacy for the refill. If patient does not wish to contact the pharmacy document the reason why and proceed with request.) (Agent: If yes, when and what did the pharmacy advise?)  Is this the correct pharmacy for this prescription? Yes If no, delete pharmacy and type the correct one.  This is the patient's preferred pharmacy:  [id number] - [place], [place] - [address] AT [facility] [address]  [address] [address] [place] [address] Phone: [phone] Fax: [phone]   Has the prescription been filled recently? No  Is the patient out of the medication? Yes  Has the patient been seen for an appointment in the last year OR does the patient have an upcoming appointment?   Can we respond through MyChart?   Agent: Please be advised that Rx refills may take up to 3 business days. We ask that you follow-up with your pharmacy.

## 2023-12-16 NOTE — Telephone Encounter (Signed)
 Copied from CRM [id number]. Topic: Clinical - Medication Refill >> Dec 16, 2023  1:15 PM Robinson H wrote: Most Recent Primary Care Visit:  Provider: AVELINA NO E  Department: [facility]  Visit Type: OFFICE VISIT  Date: 11/30/2023  Medication: levocetirizine (XYZAL ) 5 MG tablet  Has the patient contacted their pharmacy? Yes, stated wasn't on patients profile (Agent: If no, request that the patient contact the pharmacy for the refill. If patient does not wish to contact the pharmacy document the reason why and proceed with request.) (Agent: If yes, when and what did the pharmacy advise?)  Is this the correct pharmacy for this prescription? Yes If no, delete pharmacy and type the correct one.  This is the patient's preferred pharmacy:  [id number] - [place], [place] - [address] AT [facility] [address] [facility] [address] [place] [address] Phone: [phone] Fax: [phone]   Has the prescription been filled recently? No  Is the patient out of the medication? Yes  Has the patient been seen for an appointment in the last year OR does the patient have an upcoming appointment? Yes  Can we respond through MyChart? No  Agent: Please be advised that Rx refills may take up to 3 business days. We ask that you follow-up with your pharmacy.

## 2023-12-21 ENCOUNTER — Ambulatory Visit: Payer: Self-pay | Admitting: Nurse Practitioner

## 2023-12-21 ENCOUNTER — Telehealth: Payer: Self-pay | Admitting: *Deleted

## 2023-12-21 NOTE — Telephone Encounter (Signed)
   Symptoms:  Patient reports both of  her ears have been ringing  x 1 week. Patient stated she had Ear wax and used a qtip to try to get the Ear wax out. Patient share during assessment also stated her Ear does ache with some decreased hearing in both ears. Frequency: x 1 week  Pertinent Negatives: Patient denies any drainage from ear at this time. Disposition: [] ED /[] Urgent Care (no appt availability in office) / [] Appointment(In office/virtual)/ []  [facility]/ [] Home Care/ [] Refused Recommended Disposition /[] [facility]/ []  Follow-up with PCP Additional Notes:  Scheduled with Primary Care since appointment was available. Reason for Disposition  [1] Earwax problem AND [2] no improvement using Care Advice  [1] SEVERE pain AND [2] not improved 2 hours after taking analgesic medication (e.g., ibuprofen or acetaminophen )  Answer Assessment - Initial Assessment Questions 1. LOCATION: Which ear is involved?     Both ears are ringing and hurting  2. ONSET: When did the ear start hurting       1 week ago  3. SEVERITY: How bad is the pain?  (Scale 1-10; mild, moderate or severe)   - MODERATE (4-7): interferes with normal activities or awakens from sleep    -Rate 7 on pain scale URI SYMPTOMS: Do you have a runny nose or cough?      Runny nose 5. FEVER: Do you have a fever? If Yes, ask: What is your temperature, how was it measured, and when did it start?      No that they are aware 6. CAUSE: Have you been swimming recently?, How often do you use Q-TIPS?, Have you had any recent air travel or scuba diving?     Denies 7. OTHER SYMPTOMS: Do you have any other symptoms? (e.g., headache, stiff neck, dizziness, vomiting, runny nose, decreased hearing)      Headache- a little( 4 on pain scale) Runny nose - decreased hearing  a little  toothache started around the same time ( upper left toothache. 8. PREGNANCY: Is there any chance you are pregnant? When was  your last menstrual period?      Denies  as far she knows she is not.  Protocols used: Rilla Martens

## 2023-12-21 NOTE — Telephone Encounter (Signed)
 Copied from CRM [id number]. Topic: Appointments - Appointment Scheduling >> Dec 21, 2023 11:21 AM Rosina BIRCH wrote: Patient/patient representative is calling to schedule an appointment. Refer to attachments for appointment information. Pt called stating both of her ear hurts and are ringing and her tooth is achy. Pt did not know if she need to make an appointment or not

## 2023-12-21 NOTE — Telephone Encounter (Signed)
 LM for pt to returncall

## 2023-12-22 ENCOUNTER — Ambulatory Visit (INDEPENDENT_AMBULATORY_CARE_PROVIDER_SITE_OTHER): Payer: Medicare HMO | Admitting: Internal Medicine

## 2023-12-22 ENCOUNTER — Encounter: Payer: Self-pay | Admitting: Internal Medicine

## 2023-12-22 VITALS — BP 122/76 | HR 96 | Temp 97.6°F | Ht 69.0 in | Wt 284.6 lb

## 2023-12-22 DIAGNOSIS — K0889 Other specified disorders of teeth and supporting structures: Secondary | ICD-10-CM

## 2023-12-22 DIAGNOSIS — H9190 Unspecified hearing loss, unspecified ear: Secondary | ICD-10-CM | POA: Insufficient documentation

## 2023-12-22 DIAGNOSIS — H9193 Unspecified hearing loss, bilateral: Secondary | ICD-10-CM | POA: Diagnosis not present

## 2023-12-22 NOTE — Assessment & Plan Note (Signed)
 No obvious infection Is seeing dentist in a few days

## 2023-12-22 NOTE — Progress Notes (Signed)
 Subjective:    Patient ID: Kathy Gamble, female    DOB: 09-10-1976, 48 y.o.   MRN: [id number]  HPI Here due to tooth ache and ear issues  Having pain in ears Left upper molar tooth pain Set up with dentist in a few days No fever Sinus symptoms better--but come and go to some degree  Also some indigestion Tends to act up with eating pizza, sauce Hasn't been taking the wegovy  due to cost  Current Outpatient Medications on File Prior to Visit  Medication Sig Dispense Refill   albuterol  (VENTOLIN  HFA) 108 (90 Base) MCG/ACT inhaler Inhale 1-2 puffs into the lungs every 6 hours as needed for wheezing and or shortness of breah. 8.5 g 0   amLODipine  (NORVASC ) 10 MG tablet Take 1 tablet (10 mg total) by mouth daily. 90 tablet 3   fluticasone  (FLONASE ) 50 MCG/ACT nasal spray INSTILL 1 SPRAY IN EACH NOSTRIL ONCE DAILY 16 g 0   gabapentin  (NEURONTIN ) 400 MG capsule Take 2 capsules (800 mg total) by mouth at bedtime. 30 capsule 1   levocetirizine (XYZAL ) 5 MG tablet Take 1 tablet (5 mg total) by mouth every evening. 90 tablet 2   losartan  (COZAAR ) 25 MG tablet TAKE ONE TABLET BY MOUTH ONCE DAILY 90 tablet 1   montelukast  (SINGULAIR ) 10 MG tablet TAKE ONE TABLET BY MOUTH ONCE DAILY FOR sinuses 90 tablet 3   Multiple Vitamin (MULTIVITAMIN) capsule Take 1 capsule by mouth daily.     nortriptyline (PAMELOR) 10 MG capsule Take 20 mg by mouth at bedtime.     omeprazole  (PRILOSEC) 20 MG capsule Take 1 capsule (20 mg total) by mouth daily. 90 capsule 0   tiZANidine  (ZANAFLEX ) 4 MG tablet Take 1 tablet (4 mg total) by mouth at bedtime as needed for muscle spasms. 14 tablet 0   venlafaxine (EFFEXOR) 75 MG tablet Take 75 mg by mouth at bedtime.     verapamil (CALAN) 40 MG tablet Take 1 tablet by mouth 2 (two) times daily.     zonisamide (ZONEGRAN) 100 MG capsule Take 100 mg by mouth at bedtime.     No current facility-administered medications on file prior to visit.    Allergies  Allergen Reactions    Bee Venom Itching   Nortriptyline Other (See Comments)    GI distress   Latex Rash    The powder in the gloves causes rash    Past Medical History:  Diagnosis Date   Allergy    Bee sting allergy 03/02/2015   Chicken pox    Frequent headaches    GERD (gastroesophageal reflux disease)     Past Surgical History:  Procedure Laterality Date   ADENOIDECTOMY     TONSILLECTOMY      Family History  Problem Relation Age of Onset   Stroke Mother    Mental retardation Mother    Cancer Maternal Grandmother    Cancer Paternal Grandmother        Breast   Heart disease Paternal Grandfather     Social History   Socioeconomic History   Marital status: Married    Spouse name: Not on file   Number of children: Not on file   Years of education: Not on file   Highest education level: Not on file  Occupational History   Occupation: disabled  Tobacco Use   Smoking status: Never   Smokeless tobacco: Never  Vaping Use   Vaping status: Never Used  Substance and Sexual Activity   Alcohol  use: No    Alcohol/week: 0.0 standard drinks of alcohol   Drug use: Not Currently    Types: Cocaine   Sexual activity: Yes    Birth control/protection: None  Other Topics Concern   Not on file  Social History Narrative   Cognitive dysfunction - lives with her husband   Social Drivers of Corporate Investment Banker Strain: Low Risk  (04/21/2023)   Overall Financial Resource Strain (CARDIA)    Difficulty of Paying Living Expenses: Not hard at all  Food Insecurity: No Food Insecurity (04/21/2023)   Hunger Vital Sign    Worried About Running Out of Food in the Last Year: Never true    Ran Out of Food in the Last Year: Never true  Transportation Needs: No Transportation Needs (04/21/2023)   PRAPARE - Administrator, Civil Service (Medical): No    Lack of Transportation (Non-Medical): No  Physical Activity: Insufficiently Active (04/21/2023)   Exercise Vital Sign    Days of Exercise per  Week: 2 days    Minutes of Exercise per Session: 20 min  Stress: No Stress Concern Present (04/21/2023)   [employer] of Occupational Health - Occupational Stress Questionnaire    Feeling of Stress : Not at all  Social Connections: Moderately Integrated (04/21/2023)   Social Connection and Isolation Panel [NHANES]    Frequency of Communication with Friends and Family: More than three times a week    Frequency of Social Gatherings with Friends and Family: More than three times a week    Attends Religious Services: More than 4 times per year    Active Member of [employer] or Organizations: No    Attends Banker Meetings: Never    Marital Status: Married  Catering Manager Violence: Not At Risk (04/21/2023)   Humiliation, Afraid, Rape, and Kick questionnaire    Fear of Current or Ex-Partner: No    Emotionally Abused: No    Physically Abused: No    Sexually Abused: No   Review of Systems Thinks she has hearing loss Some cough Some blood from nose--like when blowing nose.  Not much drainage    Objective:   Physical Exam Constitutional:      Appearance: Normal appearance.  HENT:     Ears:     Comments: Some dullness in TMs but no inflammation No clear fluid    Mouth/Throat:     Comments: No obvious abnormality in teeth or tenderness Musculoskeletal:     Cervical back: Neck supple.  Lymphadenopathy:     Cervical: No cervical adenopathy.  Neurological:     Mental Status: She is alert.            Assessment & Plan:

## 2023-12-22 NOTE — Telephone Encounter (Signed)
 Appointment made for 12/22/23

## 2023-12-22 NOTE — Assessment & Plan Note (Signed)
 Has seen Dr Willeen Cass  Recommended she see the audiologist in his office for formal evaluation

## 2023-12-28 IMAGING — CT CT HEAD W/O CM
4 series · 16 of 47 positions shown, 18 images · non-contrast
Comparison: Head CT 07/04/2010

CLINICAL DATA: Head trauma, abnormal mental status (Age 18-64y)
Fall unknown trauma; Neck trauma, midline tenderness (Age 16-64y)



[Series 2: head wo · axial · 0.42mm/px · z∈[-227,-112]mm · 7 of 31 slices shown, 9 images]
[im 4/31  brain]
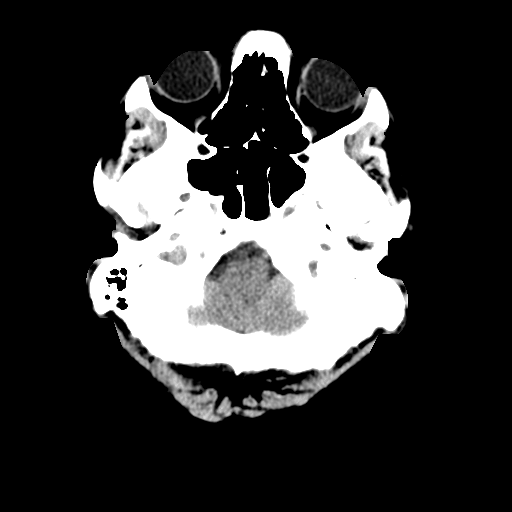
[im 4/31  bone]
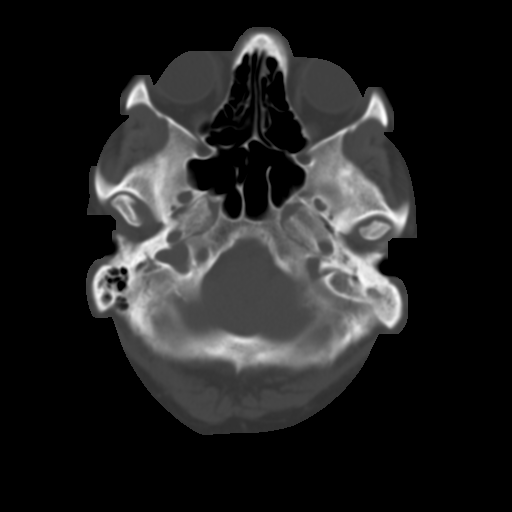
[im 8/31  brain]
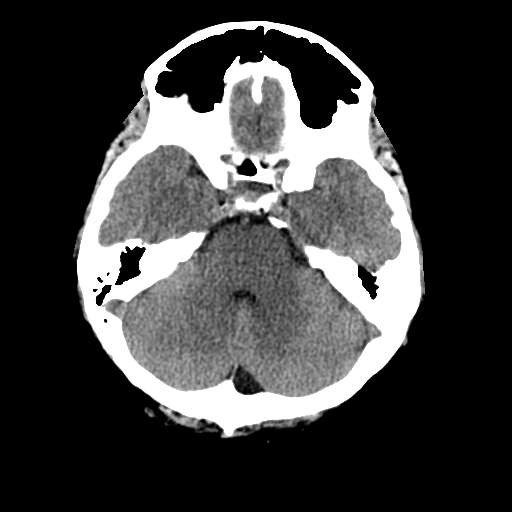
[im 12/31  brain]
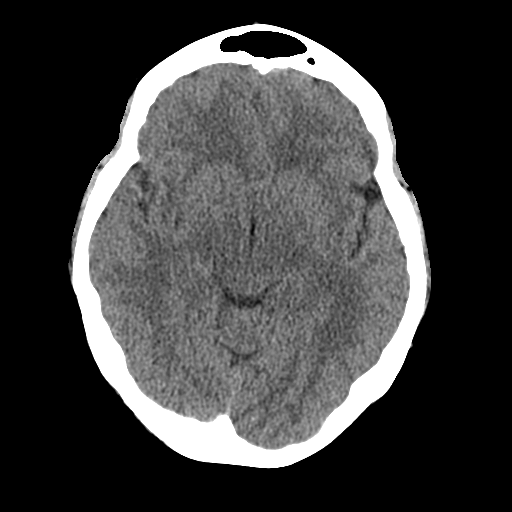
[im 16/31  brain]
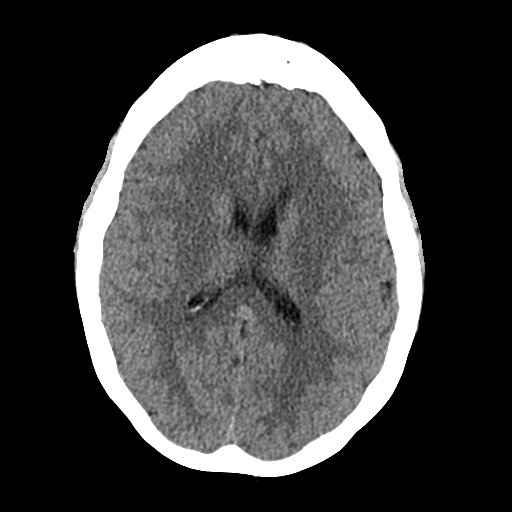
[im 19/31  brain]
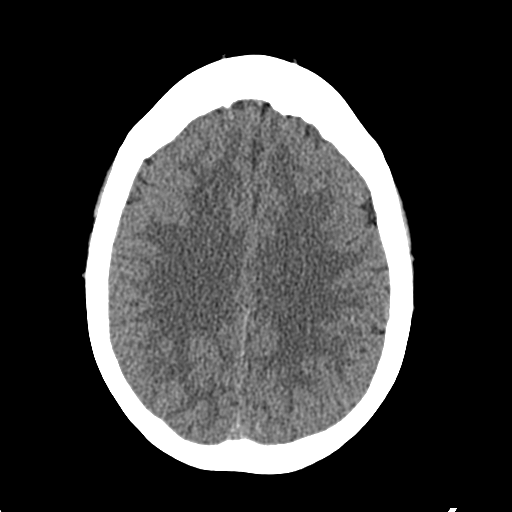
[im 19/31  bone]
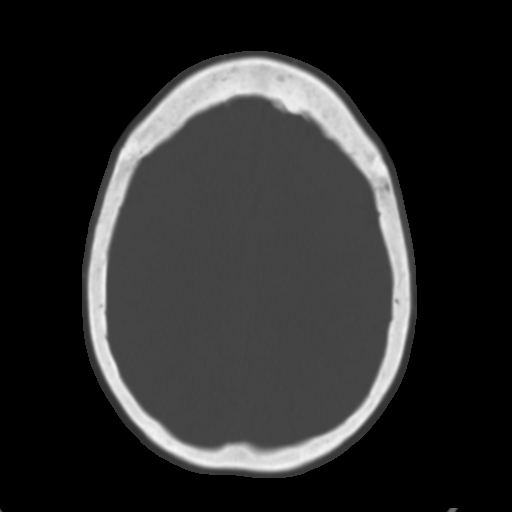
[im 23/31  brain]
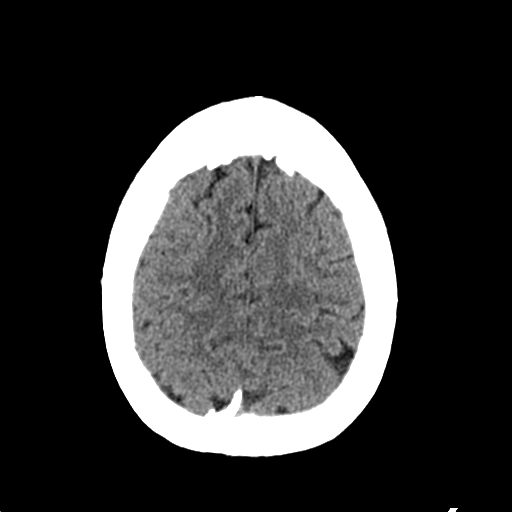
[im 27/31  brain]
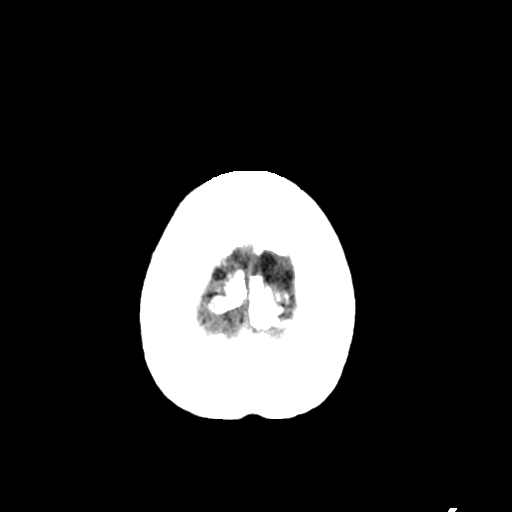

[Series 3: head bone · axial · 0.42mm/px · z∈[-228,-198]mm · 3 of 77 slices shown]
[im 8/77  bone]
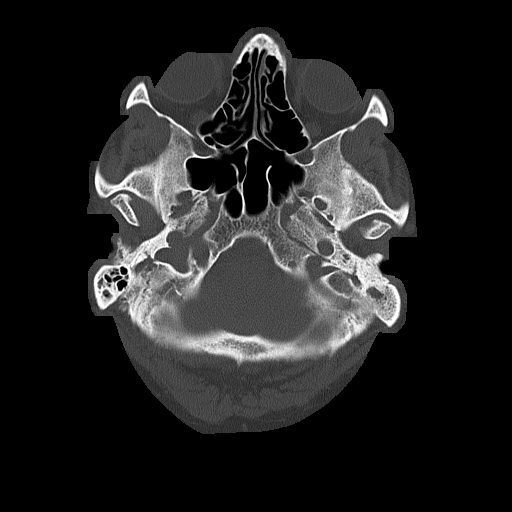
[im 16/77  bone]
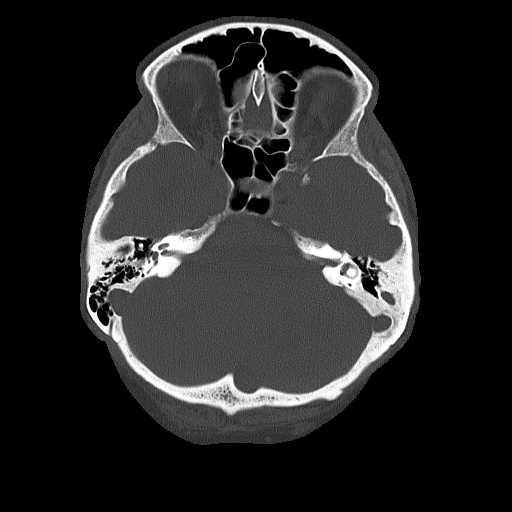
[im 23/77  bone]
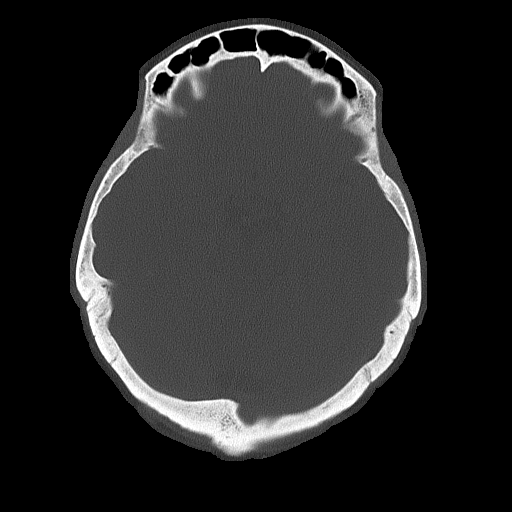

[Series 4: coronal soft tissue · coronal · 0.31mm/px · 3 of 65 slices shown]
[im 22/65  brain]
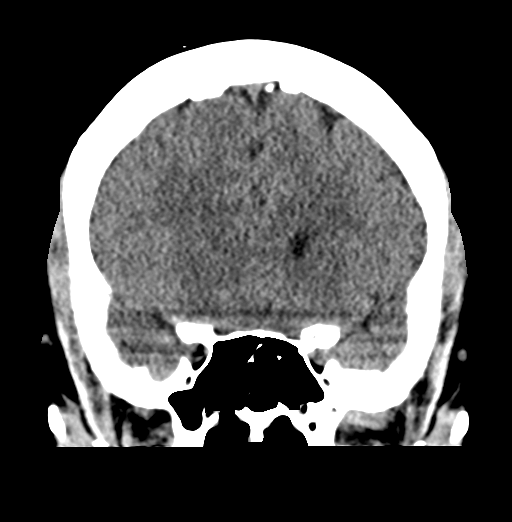
[im 29/65  brain]
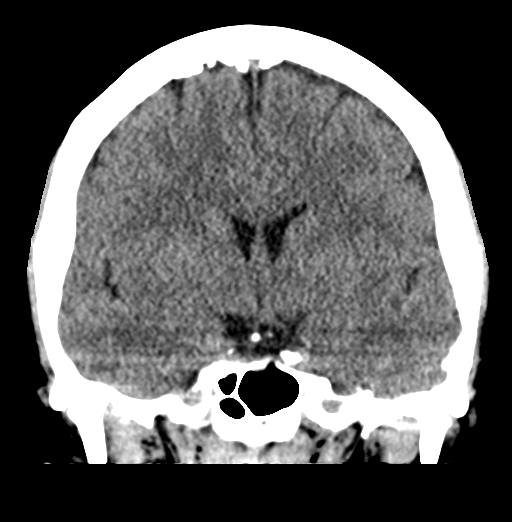
[im 36/65  brain]
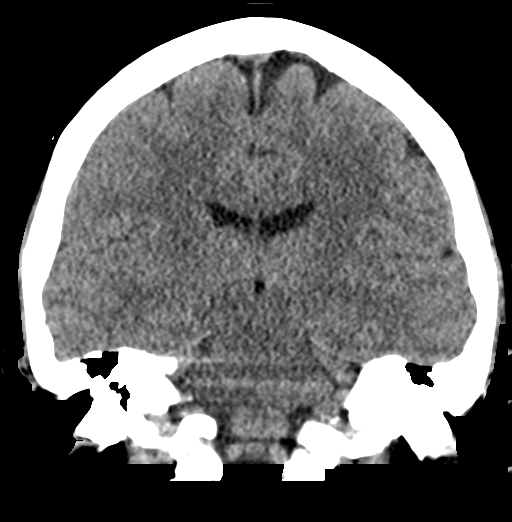

[Series 5: sagittal soft tissue · sagittal · 0.32mm/px · 3 of 55 slices shown]
[im 19/55  brain]
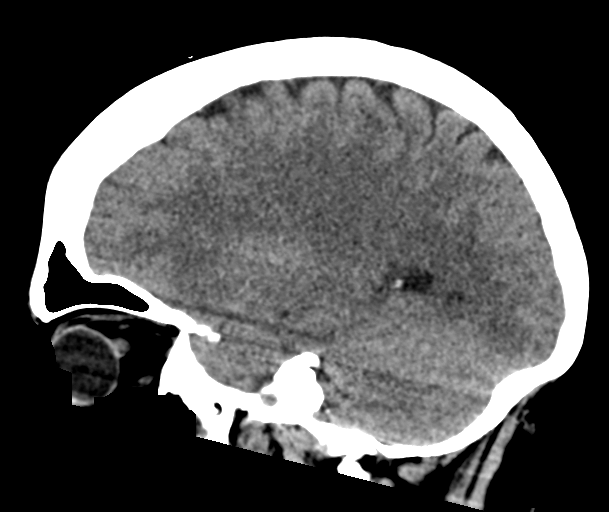
[im 28/55  brain]
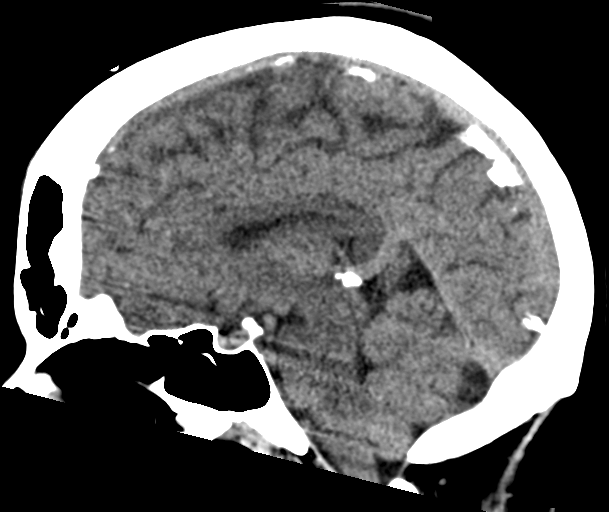
[im 37/55  brain]
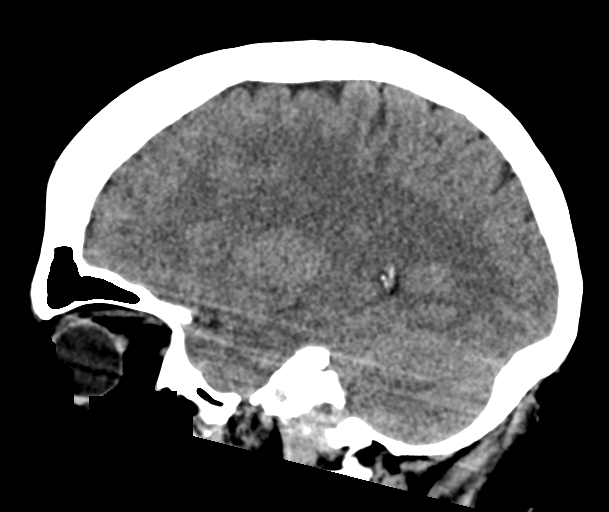

[16 of 47 positions shown; findings below may reference images not displayed]

FINDINGS: CT HEAD FINDINGS

Brain: No evidence of acute intracranial hemorrhage or extra-axial
collection.No evidence of mass lesion/concerning mass effect.The
ventricles are normal in size.

Vascular: No hyperdense vessel or unexpected calcification.

Skull: Normal. Negative for fracture or focal lesion.

Sinuses/Orbits: No acute finding.

Other: None.

CT CERVICAL SPINE FINDINGS

Alignment: Normal.

Skull base and vertebrae: No acute cervical spine fracture. There
are a lucent lesions within multiple vertebral bodies including in
C3, C4, C5, and C7. One of these thins the anterior cortex at C7
(series 6, image 34). The largest is in the posterior aspect of C7
measuring up to 7 mm (series 6, image 37).

Soft tissues and spinal canal: No prevertebral fluid or swelling. No
visible canal hematoma.

Disc levels: There is moderate degenerative disc disease at C3-C4
and mild multilevel facet arthropathy.

Upper chest: Negative.

Other: None.
IMPRESSION: No acute intracranial abnormality. No acute cervical spine fracture.

Multiple indeterminate lucent lesions within the C3, C4, C5, and C7
vertebral bodies. Differential considerations include multiple
hemangiomas versus metastases/myeloma. Recommend further evaluation
with non-emergent MRI with and without contrast.

## 2023-12-28 IMAGING — CT CT CERVICAL SPINE W/O CM
3 of 4 series · 11 of 33 positions shown, 13 images · non-contrast
Comparison: Head CT 07/04/2010

CLINICAL DATA: Head trauma, abnormal mental status (Age 18-64y)
Fall unknown trauma; Neck trauma, midline tenderness (Age 16-64y)



[Series 6: sagittal bone · sagittal · 0.28mm/px · 5 of 65 slices shown, 6 images]
[im 22/65  bone]
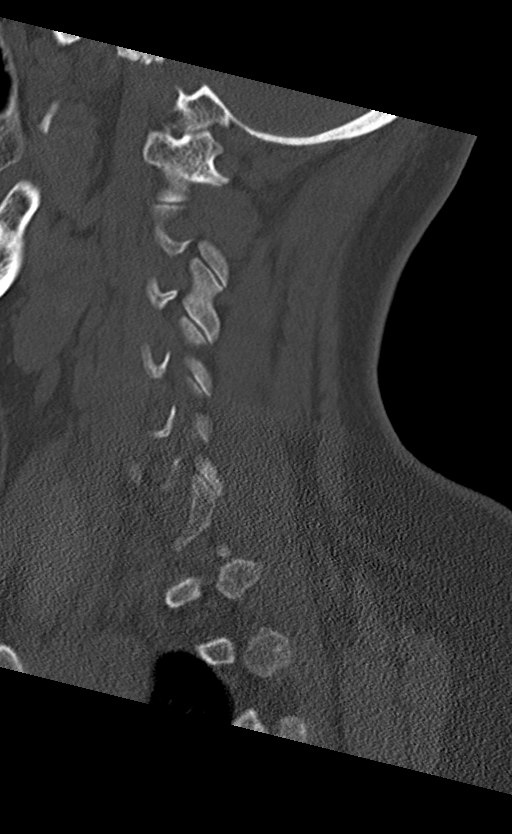
[im 27/65  bone]
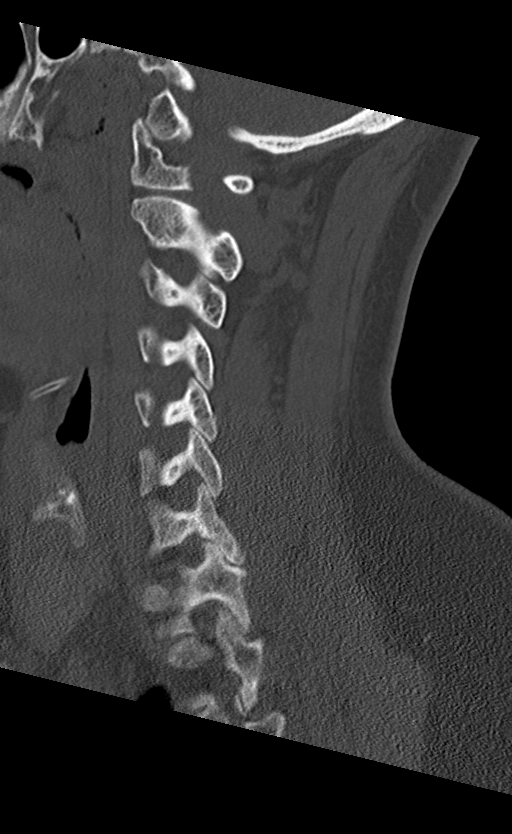
[im 33/65  soft-tissue]
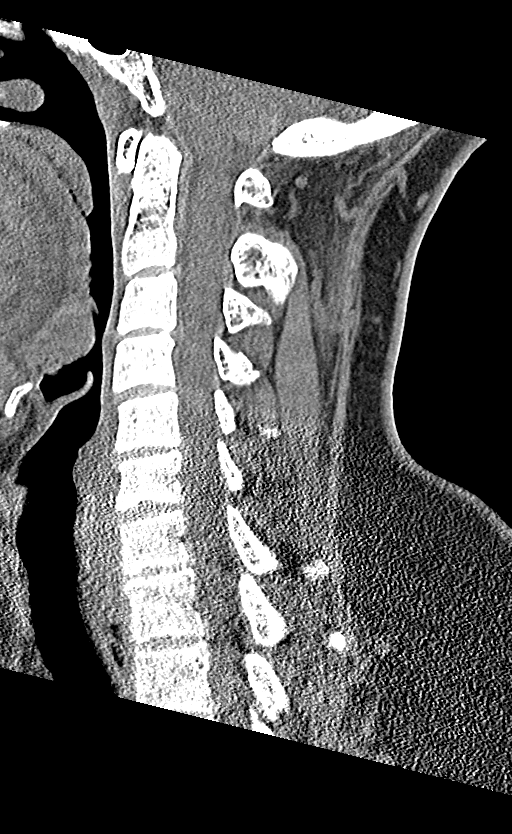
[im 33/65  bone]
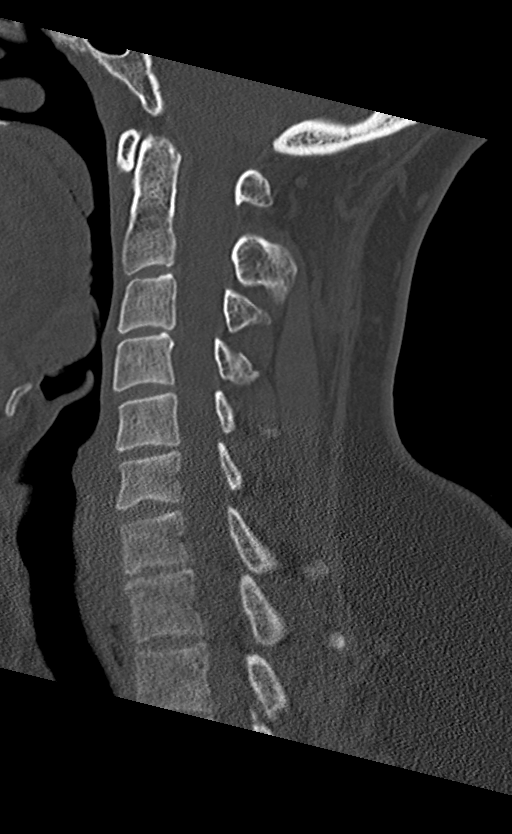
[im 38/65  bone]
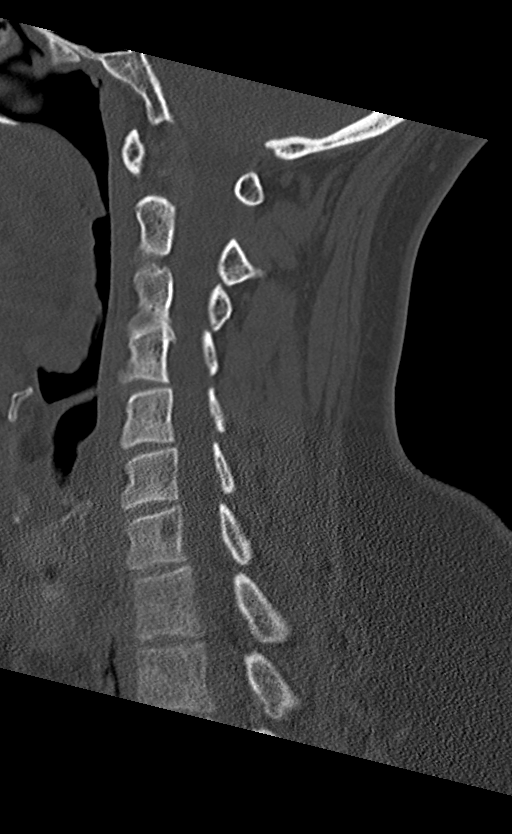
[im 43/65  bone]
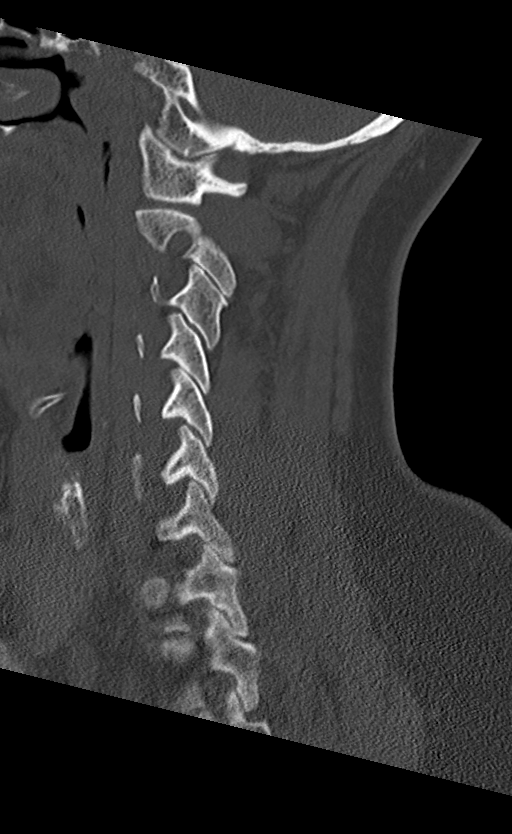

[Series 7: coronal bone · coronal · 0.25mm/px · 3 of 72 slices shown]
[im 15/72  bone]
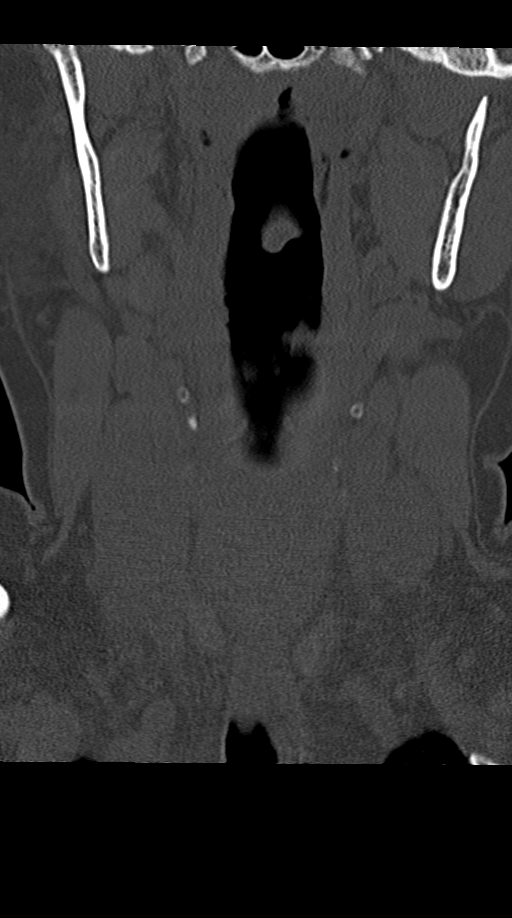
[im 29/72  bone]
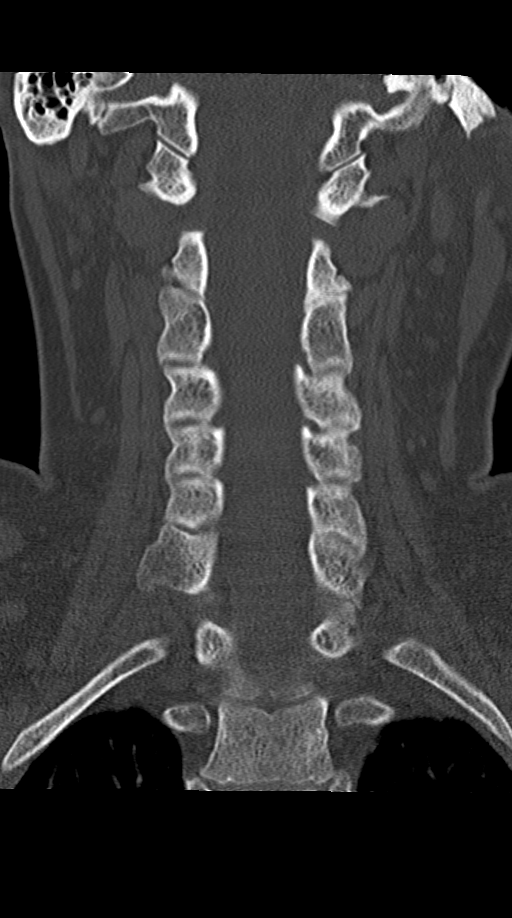
[im 43/72  bone]
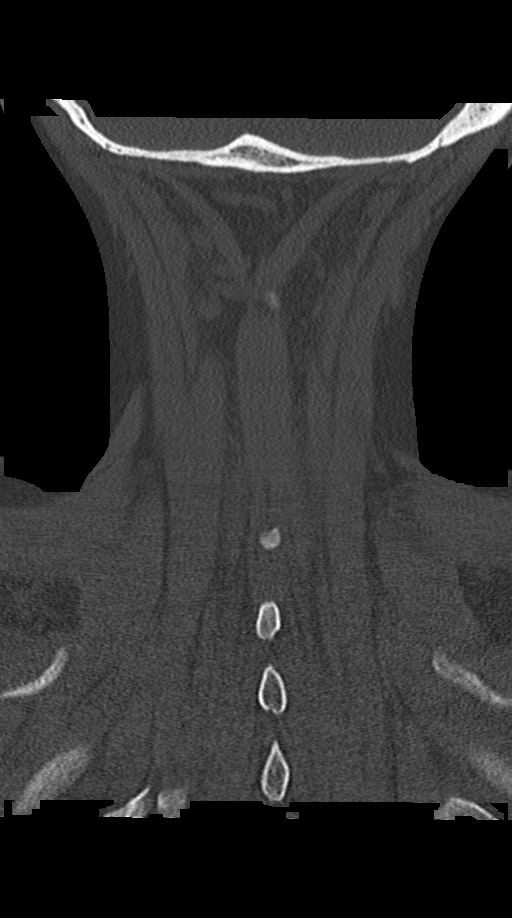

[Series 8: orthogonal bone · axial · 0.25mm/px · z∈[-399,-279]mm · 3 of 110 slices shown, 4 images]
[im 32/110  soft-tissue]
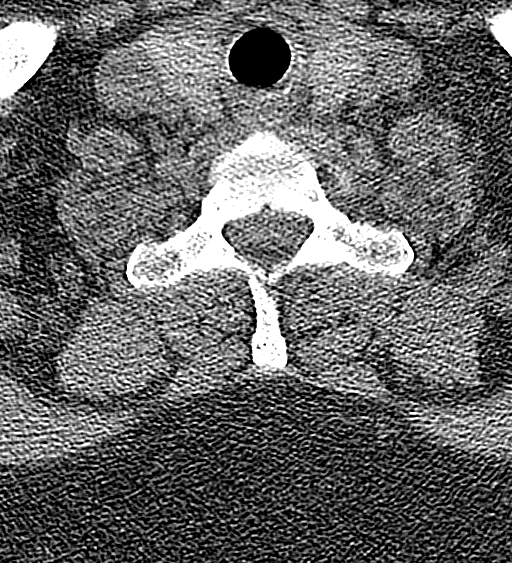
[im 32/110  bone]
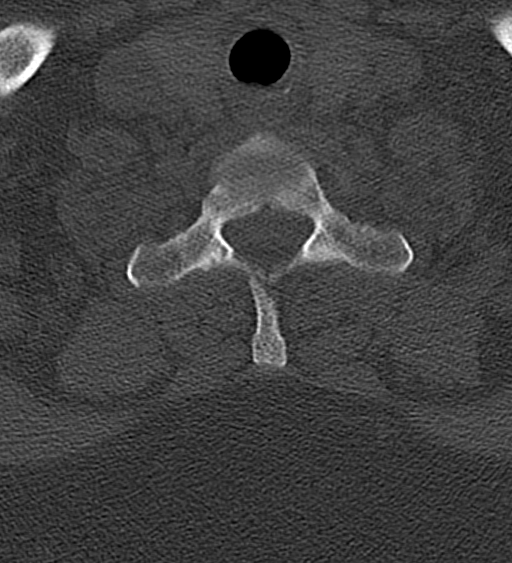
[im 63/110  bone]
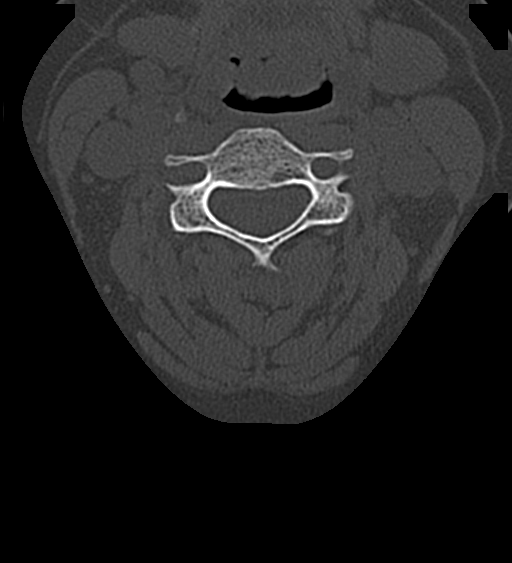
[im 94/110  bone]
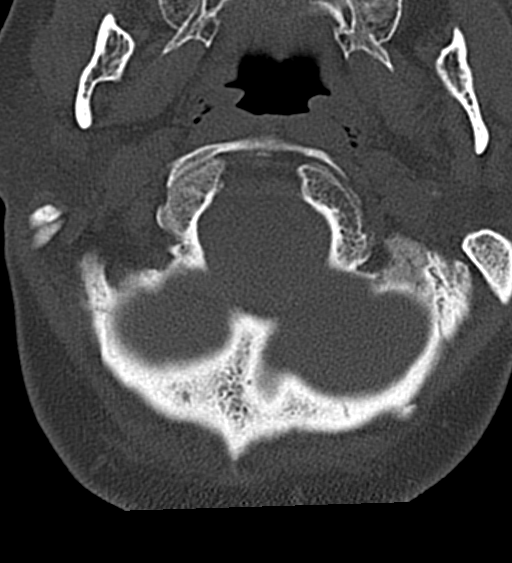

[11 of 33 positions shown; findings below may reference images not displayed]

FINDINGS: CT HEAD FINDINGS

Brain: No evidence of acute intracranial hemorrhage or extra-axial
collection.No evidence of mass lesion/concerning mass effect.The
ventricles are normal in size.

Vascular: No hyperdense vessel or unexpected calcification.

Skull: Normal. Negative for fracture or focal lesion.

Sinuses/Orbits: No acute finding.

Other: None.

CT CERVICAL SPINE FINDINGS

Alignment: Normal.

Skull base and vertebrae: No acute cervical spine fracture. There
are a lucent lesions within multiple vertebral bodies including in
C3, C4, C5, and C7. One of these thins the anterior cortex at C7
(series 6, image 34). The largest is in the posterior aspect of C7
measuring up to 7 mm (series 6, image 37).

Soft tissues and spinal canal: No prevertebral fluid or swelling. No
visible canal hematoma.

Disc levels: There is moderate degenerative disc disease at C3-C4
and mild multilevel facet arthropathy.

Upper chest: Negative.

Other: None.
IMPRESSION: No acute intracranial abnormality. No acute cervical spine fracture.

Multiple indeterminate lucent lesions within the C3, C4, C5, and C7
vertebral bodies. Differential considerations include multiple
hemangiomas versus metastases/myeloma. Recommend further evaluation
with non-emergent MRI with and without contrast.

## 2023-12-31 ENCOUNTER — Other Ambulatory Visit: Payer: Self-pay | Admitting: Family Medicine

## 2024-01-04 ENCOUNTER — Other Ambulatory Visit: Payer: Self-pay | Admitting: Family Medicine

## 2024-01-04 ENCOUNTER — Encounter (INDEPENDENT_AMBULATORY_CARE_PROVIDER_SITE_OTHER): Payer: Medicare HMO | Admitting: Family Medicine

## 2024-01-10 ENCOUNTER — Ambulatory Visit: Payer: Self-pay | Admitting: Nurse Practitioner

## 2024-01-10 ENCOUNTER — Other Ambulatory Visit: Payer: Self-pay | Admitting: Nurse Practitioner

## 2024-01-10 NOTE — Telephone Encounter (Signed)
Copied from CRM [id number]. Topic: Clinical - Red Word Triage >> Jan 10, 2024  1:31 PM Gurney Maxin H wrote: [facility] that prompted transfer to Nurse Triage: Coughing and spit up blood mucus bubble   Chief Complaint: dry cough Symptoms: bilateral ear pain Frequency: 1 week Pertinent Negatives: Patient denies fever Disposition: [] ED /[] Urgent Care (no appt availability in office) / [x] Appointment(In office/virtual)/ []  [facility]/ [] Home Care/ [] Refused Recommended Disposition /[] [facility]/ []  Follow-up with PCP Additional Notes: The patient reported an ongoing dry cough for 1 week.  She coughed up sputum with blood streaks and stated it's likely due to the dry air.  She has been using Flonase as she has seasonal allergies.  She reported intermittent shortness of breath with exertion.  She has bilateral ear pain and a headache that has been ongoing for 1 week.  Over the counter medicine is slightly helpful.  She has a history of migraines and last saw neurology 11/17/2023.  She was scheduled for a next day appointment for further evaluation. She is requesting a 90 day supply of Gabapentin for her headaches. She has been out for at least 1 week.  She takes two capsules at night and 1 capsule in the morning as advised by Dr. Malvin Johns.  Please advise is patient should request refills through neuro as refill request is pending for [employer].   Reason for Disposition  Earache is present  Answer Assessment - Initial Assessment Questions 1. ONSET: "When did the cough begin?"      1 week  2. SEVERITY: "How bad is the cough today?"      Coughing throughout the day intermittent but worse at night  3. SPUTUM: "Describe the color of your sputum" (none, dry cough; clear, white, yellow, green)     Dry  4. HEMOPTYSIS: "Are you coughing up any blood?" If so ask: "How much?" (flecks, streaks, tablespoons, etc.)     Streaks of blood in mucus  5. DIFFICULTY BREATHING: "Are you having  difficulty breathing?" If Yes, ask: "How bad is it?" (e.g., mild, moderate, severe)    - MILD: No SOB at rest, mild SOB with walking, speaks normally in sentences, can lie down, no retractions, pulse < 100.    - MODERATE: SOB at rest, SOB with minimal exertion and prefers to sit, cannot lie down flat, speaks in phrases, mild retractions, audible wheezing, pulse 100-120.    - SEVERE: Very SOB at rest, speaks in single words, struggling to breathe, sitting hunched forward, retractions, pulse > 120      Intermittent shortness of breath with exertion  6. FEVER: "Do you have a fever?" If Yes, ask: "What is your temperature, how was it measured, and when did it start?"     No 7. CARDIAC HISTORY: "Do you have any history of heart disease?" (e.g., heart attack, congestive heart failure)      Hypertension  8. LUNG HISTORY: "Do you have any history of lung disease?"  (e.g., pulmonary embolus, asthma, emphysema)     Asthma  9. PE RISK FACTORS: "Do you have a history of blood clots?" (or: recent major surgery, recent prolonged travel, bedridden)     No  10. OTHER SYMPTOMS: "Do you have any other symptoms?" (e.g., runny nose, wheezing, chest pain)       History of allergies "allergic to the trees Has been using Flonase   Dry outside  Headache - ongoing 1 week Goody's powder somewhat helpful  Tylenol and ibuprofen -  only slightly helpful Ear pain - both ears  Protocols used: Cough - Acute Non-Productive-A-AH

## 2024-01-10 NOTE — Telephone Encounter (Signed)
Noted. Agree that she needs to follow up with neurology if they prescribed the gabapentin

## 2024-01-10 NOTE — Telephone Encounter (Signed)
Copied from CRM [id number]. Topic: Clinical - Medication Refill >> Jan 10, 2024  1:27 PM Gurney Maxin H wrote: Most Recent Primary Care Visit:  Provider: Tillman Abide I  Department: [facility]  Visit Type: ACUTE  Date: 12/22/2023  Medication: gabapentin (NEURONTIN) 400 MG capsule  Has the patient contacted their pharmacy? Yes, waiting (Agent: If no, request that the patient contact the pharmacy for the refill. If patient does not wish to contact the pharmacy document the reason why and proceed with request.) (Agent: If yes, when and what did the pharmacy advise?)  Is this the correct pharmacy for this prescription? Yes If no, delete pharmacy and type the correct one.  This is the patient's preferred pharmacy:  [id number] - [place], [place] - [address] AT [facility] [address] [facility] [address] [place] [address] Phone: [phone] Fax: [phone]   Has the prescription been filled recently? No  Is the patient out of the medication? Yes  Has the patient been seen for an appointment in the last year OR does the patient have an upcoming appointment? Yes  Can we respond through MyChart? Yes  Agent: Please be advised that Rx refills may take up to 3 business days. We ask that you follow-up with your pharmacy.

## 2024-01-11 ENCOUNTER — Ambulatory Visit: Payer: Medicaid Other | Admitting: Family Medicine

## 2024-01-13 DIAGNOSIS — T50905D Adverse effect of unspecified drugs, medicaments and biological substances, subsequent encounter: Secondary | ICD-10-CM | POA: Diagnosis not present

## 2024-01-13 DIAGNOSIS — R202 Paresthesia of skin: Secondary | ICD-10-CM | POA: Diagnosis not present

## 2024-01-13 DIAGNOSIS — G479 Sleep disorder, unspecified: Secondary | ICD-10-CM | POA: Diagnosis not present

## 2024-01-13 DIAGNOSIS — H9203 Otalgia, bilateral: Secondary | ICD-10-CM | POA: Diagnosis not present

## 2024-01-13 DIAGNOSIS — R200 Anesthesia of skin: Secondary | ICD-10-CM | POA: Diagnosis not present

## 2024-01-13 DIAGNOSIS — H938X3 Other specified disorders of ear, bilateral: Secondary | ICD-10-CM | POA: Diagnosis not present

## 2024-01-13 DIAGNOSIS — R519 Headache, unspecified: Secondary | ICD-10-CM | POA: Diagnosis not present

## 2024-01-13 DIAGNOSIS — F411 Generalized anxiety disorder: Secondary | ICD-10-CM | POA: Diagnosis not present

## 2024-01-15 ENCOUNTER — Other Ambulatory Visit: Payer: Self-pay | Admitting: Family Medicine

## 2024-01-17 ENCOUNTER — Telehealth: Payer: Self-pay | Admitting: Nurse Practitioner

## 2024-01-17 ENCOUNTER — Other Ambulatory Visit: Payer: Self-pay | Admitting: Nurse Practitioner

## 2024-01-17 ENCOUNTER — Ambulatory Visit: Payer: Self-pay | Admitting: Nurse Practitioner

## 2024-01-17 NOTE — Telephone Encounter (Signed)
See 01/17/24 phone note.

## 2024-01-17 NOTE — Telephone Encounter (Signed)
I spoke with [name] (DPR signed) pt did not go to ED; pt took Goody powder and that eased pain off and pt is doing some things around house now. [name] said pt seems "fine now". Evidently on speaker phone and in background heard pt say her pain is still 10. I asked pt if pain is that bad is she going to ED and pt said she did not want to go to ED. [name] said could be dental problem and pt has appt with dentist on 01/18/24. UC &ED precautions given and pt and [name] voiced understanding. Sending note to Iven Finn NP.

## 2024-01-17 NOTE — Telephone Encounter (Signed)
Noted.  I do not see where patient has checked into the emergency department yet.

## 2024-01-17 NOTE — Telephone Encounter (Signed)
Patient was returning call received Transfer from [place] states calling back to [employer]

## 2024-01-17 NOTE — Telephone Encounter (Signed)
 Noted

## 2024-01-17 NOTE — Telephone Encounter (Signed)
Unable to reach pt or pts husband at the listed contact #s. Left v/m request cb to [phone]. I did speak with pts father DPR signed and he advised to leave v/m on pt and Mark's phone for someone to cb.,sending note to [facility] triage and Cable pool.

## 2024-01-17 NOTE — Telephone Encounter (Signed)
  Chief Complaint: headache Symptoms: constant headache for 2 weeks, states she is starting off into space and confusion at times Frequency: 2 weeks Pertinent Negatives: Patient denies CP, SOB Disposition: [x] ED /[] Urgent Care (no appt availability in office) / [] Appointment(In office/virtual)/ []  [facility]/ [] Home Care/ [] Refused Recommended Disposition /[] [place] [facility]/ []  Follow-up with PCP Additional Notes: patient with hx of migraines endorses ongoing headache for the last two weeks. Patient states pain is interrupting sleep, staring off into space and having some confusion. Per protocol, patient is recommended to the emergency department. Patient does push back asking why the office wouldn't be able to do anything, Patient is educated that ED evaluation would be appropriate for the length of pain that she has been in and what the ED can offer from an evaluation standpoint. Patient verbalizes understanding of plan and all questions answered.     Copied from CRM [id number]. Topic: Clinical - Red Word Triage >> Jan 17, 2024 10:27 AM Pascal Lux wrote: Red Word that prompted transfer to Nurse Triage: On going headache for two weeks,  interrupting sleep.   9/10 pain. Stated she stares into space;confusion of what is going on. Reason for Disposition  [1] SEVERE headache (e.g., excruciating) AND [2] "worst headache" of life  Answer Assessment - Initial Assessment Questions 1. LOCATION: "Where does it hurt?"      Top of head  2. ONSET: "When did the headache start?" (Minutes, hours or days)      2 weeks ago 3. PATTERN: "Does the pain come and go, or has it been constant since it started?"     constant 4. SEVERITY: "How bad is the pain?" and "What does it keep you from doing?"  (e.g., Scale 1-10; mild, moderate, or severe)   - MILD (1-3): doesn't interfere with normal activities    - MODERATE (4-7): interferes with normal activities or awakens from sleep    - SEVERE (8-10):  excruciating pain, unable to do any normal activities        10 out of 10 5. RECURRENT SYMPTOM: "Have you ever had headaches before?" If Yes, ask: "When was the last time?" and "What happened that time?"      "No not really" 6. CAUSE: "What do you think is causing the headache?"     "Not really" 7. MIGRAINE: "Have you been diagnosed with migraine headaches?" If Yes, ask: "Is this headache similar?"      Yes-similar to previous migraines 8. HEAD INJURY: "Has there been any recent injury to the head?"      No 9. OTHER SYMPTOMS: "Do you have any other symptoms?" (fever, stiff neck, eye pain, sore throat, cold symptoms)     No 10. PREGNANCY: "Is there any chance you are pregnant?" "When was your last menstrual period?"       No  Protocols used: Headache-A-AH

## 2024-01-18 ENCOUNTER — Telehealth: Payer: Self-pay

## 2024-01-18 NOTE — Telephone Encounter (Signed)
  Just an FYI   Copied from CRM [id number]. Topic: Clinical - Medical Advice >> Jan 18, 2024  3:28 PM [name] wrote: Reason for CRM: Patient called in stating she went to the dentist and stated that 11 to 12 teeth has decayed, stated she wanted to send that in to [facility] so he could stay updated. Will call back for another update when she has more information

## 2024-01-20 ENCOUNTER — Encounter: Payer: Self-pay | Admitting: Nurse Practitioner

## 2024-01-20 ENCOUNTER — Ambulatory Visit (INDEPENDENT_AMBULATORY_CARE_PROVIDER_SITE_OTHER): Payer: Medicare Other | Admitting: Nurse Practitioner

## 2024-01-20 ENCOUNTER — Other Ambulatory Visit: Payer: Self-pay | Admitting: Nurse Practitioner

## 2024-01-20 VITALS — BP 114/80 | HR 80 | Temp 97.9°F | Ht 69.0 in | Wt 286.0 lb

## 2024-01-20 DIAGNOSIS — Z1159 Encounter for screening for other viral diseases: Secondary | ICD-10-CM

## 2024-01-20 DIAGNOSIS — Z114 Encounter for screening for human immunodeficiency virus [HIV]: Secondary | ICD-10-CM | POA: Diagnosis not present

## 2024-01-20 DIAGNOSIS — I10 Essential (primary) hypertension: Secondary | ICD-10-CM

## 2024-01-20 DIAGNOSIS — G43E11 Chronic migraine with aura, intractable, with status migrainosus: Secondary | ICD-10-CM | POA: Diagnosis not present

## 2024-01-20 DIAGNOSIS — Z13220 Encounter for screening for lipoid disorders: Secondary | ICD-10-CM

## 2024-01-20 DIAGNOSIS — Z0000 Encounter for general adult medical examination without abnormal findings: Secondary | ICD-10-CM | POA: Diagnosis not present

## 2024-01-20 DIAGNOSIS — Z131 Encounter for screening for diabetes mellitus: Secondary | ICD-10-CM | POA: Diagnosis not present

## 2024-01-20 DIAGNOSIS — R600 Localized edema: Secondary | ICD-10-CM | POA: Diagnosis not present

## 2024-01-20 DIAGNOSIS — Z1211 Encounter for screening for malignant neoplasm of colon: Secondary | ICD-10-CM

## 2024-01-20 DIAGNOSIS — R040 Epistaxis: Secondary | ICD-10-CM | POA: Diagnosis not present

## 2024-01-20 DIAGNOSIS — Z1231 Encounter for screening mammogram for malignant neoplasm of breast: Secondary | ICD-10-CM

## 2024-01-20 DIAGNOSIS — J4530 Mild persistent asthma, uncomplicated: Secondary | ICD-10-CM | POA: Diagnosis not present

## 2024-01-20 LAB — COMPREHENSIVE METABOLIC PANEL
ALT: 11 U/L (ref 0–35)
AST: 12 U/L (ref 0–37)
Albumin: 3.8 g/dL (ref 3.5–5.2)
Alkaline Phosphatase: 141 U/L — ABNORMAL HIGH (ref 39–117)
BUN: 17 mg/dL (ref 6–23)
CO2: 26 meq/L (ref 19–32)
Calcium: 8.7 mg/dL (ref 8.4–10.5)
Chloride: 107 meq/L (ref 96–112)
Creatinine, Ser: 0.66 mg/dL (ref 0.40–1.20)
GFR: 104.63 mL/min (ref >60.00)
Glucose, Bld: 91 mg/dL (ref 70–99)
Potassium: 4 meq/L (ref 3.5–5.1)
Sodium: 141 meq/L (ref 135–145)
Total Bilirubin: 0.4 mg/dL (ref 0.2–1.2)
Total Protein: 6.3 g/dL (ref 6.0–8.3)

## 2024-01-20 LAB — CBC
HCT: 40.6 % (ref 36.0–46.0)
Hemoglobin: 13.6 g/dL (ref 12.0–15.0)
MCHC: 33.4 g/dL (ref 30.0–36.0)
MCV: 89.1 fL (ref 78.0–100.0)
Platelets: 296 10*3/uL (ref 150.0–400.0)
RBC: 4.56 Mil/uL (ref 3.87–5.11)
RDW: 14.8 % (ref 11.5–15.5)
WBC: 7.5 10*3/uL (ref 4.0–10.5)

## 2024-01-20 LAB — LIPID PANEL
Cholesterol: 154 mg/dL (ref 0–200)
HDL: 47.1 mg/dL (ref >39.00)
LDL Cholesterol: 73 mg/dL (ref 0–99)
NonHDL: 106.95
Total CHOL/HDL Ratio: 3
Triglycerides: 169 mg/dL — ABNORMAL HIGH (ref 0.0–149.0)
VLDL: 33.8 mg/dL (ref 0.0–40.0)

## 2024-01-20 LAB — BRAIN NATRIURETIC PEPTIDE: Pro B Natriuretic peptide (BNP): 17 pg/mL (ref 0.0–100.0)

## 2024-01-20 LAB — HEMOGLOBIN A1C: Hgb A1c MFr Bld: 5.2 % (ref 4.6–6.5)

## 2024-01-20 LAB — TSH: TSH: 1.69 u[IU]/mL (ref 0.35–5.50)

## 2024-01-20 MED ORDER — LOSARTAN POTASSIUM 25 MG PO TABS: 25.0000 mg | ORAL_TABLET | Freq: Every day | ORAL | 3 refills | Status: DC

## 2024-01-20 NOTE — Assessment & Plan Note (Signed)
 Patient currently maintained on albuterol  as needed, Singulair , Xyzal , Flonase .  We will discontinue Flonase  at this juncture as patient is experiencing epistaxis.  Continue other medications prescribed stable

## 2024-01-20 NOTE — Assessment & Plan Note (Signed)
 Patient currently followed by neurology she is on gabapentin  400 mg at bedtime, zonisamide 100 mg at bedtime and Ajovy.  Continue following with neurology as recommended take medication as prescribed

## 2024-01-20 NOTE — Assessment & Plan Note (Signed)
 Discontinue Flonase  and see if this is beneficial.

## 2024-01-20 NOTE — Patient Instructions (Signed)
 Nice to see you today I will be in touch with the labs once I have them Follow up with me in 1 year, sooner if you need me  STOP taking the flonase 

## 2024-01-20 NOTE — Assessment & Plan Note (Signed)
 Patient currently maintained on amlodipine  10 mg and losartan  25 mg daily.  Does not check blood pressure at home.  Blood pressure within normal limits today.  Continue medication as prescribed if all lab testing is negative consider discontinuing or reducing amlodipine  due to lower extremity edema

## 2024-01-20 NOTE — Assessment & Plan Note (Signed)
 Discussed age-appropriate immunizations and screening exams.  Did review patient's personal, surgical, social, family histories.  Patient is up-to-date on all age-appropriate vaccinations she would like.  Cologuard ordered for CRC screening.  Mammogram ordered for breast cancer screening.  Patient up-to-date on cervical cancer screening.  Patient was given information at discharge about preventative healthcare maintenance with anticipatory guidance

## 2024-01-20 NOTE — Progress Notes (Signed)
 Established Patient Office Visit  Subjective   Patient ID: URI COVEY, female    DOB: 07-May-1976  Age: 48 y.o. MRN: [id number]  Chief Complaint  Patient presents with   Annual Exam    HPI  HTN: currenlty on amlodipine  10mg  and losartan  25. States that she does not have losartan . States that she is unsure if she has been taking it. Does not check blood pressure at home  Migraine/sleeping difficulty: patent is followed by neurology receenlty d/c verapamil and nortriptyline, Started seroquel , continued gabapentin  400 at bedtime and zonegran 100  Ashtma: on singular, flonase , albuterol  , xyzal . States that she will use her albuterol  inhaler once every 1-2 weeks.  States that she does cough up some yellow mucus on occasion and is having intermittent nosebleeds  for complete physical and follow up of chronic conditions.  Immunizations: -Tetanus: Completed in 2016 -Influenza: 10/01/2023 -Shingles: Too young -Pneumonia: Too young  Diet: Fair diet. Eat 3 meals a day. She will do snacks that are healthy options on ocassion. She will drin water and soda.  Exercise:  She will walk around  around the block every day and it takes 20 mins  Eye exam: Completes annually. glasses  Dental exam: Completes semi-annually    Colonoscopy: Ordered for cologuard Today Lung Cancer Screening: N/A  Pap smear: 12/03/2021 negative cells and cotesting   Mammogram: 05/27/2022.  Needs order for [place]   Sleep: goes to bed around 930 and not sleeping well but is sleeping a lot. She states that it is because of the headaches.  Patient was recently put on Seroquel  to help with sleep       Review of Systems  Constitutional:  Negative for chills and fever.  Respiratory:  Negative for shortness of breath.   Cardiovascular:  Negative for chest pain and leg swelling.  Gastrointestinal:  Negative for abdominal pain, blood in stool, constipation, diarrhea, nausea and vomiting.       BM every 2-3 days    Genitourinary:  Negative for dysuria and hematuria.  Neurological:  Positive for headaches. Negative for tingling.  Psychiatric/Behavioral:  Negative for hallucinations and suicidal ideas.       Objective:     BP 114/80   Pulse 80   Temp 97.9 F (36.6 C) (Oral)   Ht 5' 9 (1.753 m)   Wt 286 lb (129.7 kg)   LMP  (LMP Unknown)   SpO2 98%   BMI 42.23 kg/m  BP Readings from Last 3 Encounters:  01/20/24 114/80  12/22/23 122/76  11/30/23 110/84   Wt Readings from Last 3 Encounters:  01/20/24 286 lb (129.7 kg)  12/22/23 284 lb 9.6 oz (129.1 kg)  11/30/23 283 lb 4 oz (128.5 kg)   SpO2 Readings from Last 3 Encounters:  01/20/24 98%  12/22/23 97%  11/30/23 97%      Physical Exam Vitals and nursing note reviewed.  Constitutional:      Appearance: Normal appearance.  HENT:     Right Ear: Tympanic membrane, ear canal and external ear normal.     Left Ear: Tympanic membrane, ear canal and external ear normal.     Mouth/Throat:     Mouth: Mucous membranes are moist.     Pharynx: Oropharynx is clear.  Eyes:     Extraocular Movements: Extraocular movements intact.     Pupils: Pupils are equal, round, and reactive to light.  Cardiovascular:     Rate and Rhythm: Normal rate and regular rhythm.  Pulses: Normal pulses.     Heart sounds: Normal heart sounds.  Pulmonary:     Effort: Pulmonary effort is normal.     Breath sounds: Normal breath sounds.  Abdominal:     General: Bowel sounds are normal. There is no distension.     Palpations: There is no mass.     Tenderness: There is no abdominal tenderness.     Hernia: No hernia is present.  Musculoskeletal:     Right lower leg: Edema present.     Left lower leg: Edema present.  Lymphadenopathy:     Cervical: No cervical adenopathy.  Skin:    General: Skin is warm.  Neurological:     General: No focal deficit present.     Mental Status: She is alert.     Deep Tendon Reflexes:     Reflex Scores:      Bicep reflexes  are 2+ on the right side and 2+ on the left side.      Patellar reflexes are 2+ on the right side and 2+ on the left side.    Comments: Bilateral upper and lower extremity strength 5/5  Psychiatric:        Mood and Affect: Mood normal.        Behavior: Behavior normal.        Thought Content: Thought content normal.        Judgment: Judgment normal.      No results found for any visits on 01/20/24.    The 10-year ASCVD risk score (Arnett DK, et al., 2019) is: 0.6%    Assessment & Plan:   Problem List Items Addressed This Visit       Cardiovascular and Mediastinum   Chronic migraine with aura   Patient currently followed by neurology she is on gabapentin  400 mg at bedtime, zonisamide 100 mg at bedtime and Ajovy.  Continue following with neurology as recommended take medication as prescribed      Relevant Medications   Erenumab-aooe (AIMOVIG) 140 MG/ML SOAJ   losartan  (COZAAR ) 25 MG tablet   Primary hypertension   Patient currently maintained on amlodipine  10 mg and losartan  25 mg daily.  Does not check blood pressure at home.  Blood pressure within normal limits today.  Continue medication as prescribed if all lab testing is negative consider discontinuing or reducing amlodipine  due to lower extremity edema      Relevant Medications   losartan  (COZAAR ) 25 MG tablet   Other Relevant Orders   CBC   Comprehensive metabolic panel   TSH     Respiratory   Mild persistent asthma   Patient currently maintained on albuterol  as needed, Singulair , Xyzal , Flonase .  We will discontinue Flonase  at this juncture as patient is experiencing epistaxis.  Continue other medications prescribed stable        Other   Preventative health care - Primary   Discussed age-appropriate immunizations and screening exams.  Did review patient's personal, surgical, social, family histories.  Patient is up-to-date on all age-appropriate vaccinations she would like.  Cologuard ordered for CRC  screening.  Mammogram ordered for breast cancer screening.  Patient up-to-date on cervical cancer screening.  Patient was given information at discharge about preventative healthcare maintenance with anticipatory guidance      Epistaxis   Discontinue Flonase  and see if this is beneficial.      Morbid obesity (HCC)   Pending TSH, A1c, lipid panel.  Patient has been given information for the healthy weight wellness clinic  prior to this office visit.  Continue working on healthy lifestyle modifications      Relevant Orders   Hemoglobin A1c   Bilateral lower extremity edema   Check basic labs inclusive of TSH and BNP.  If all negative consider discontinuing or lowering the dose of amlodipine .      Relevant Orders   Brain natriuretic peptide   Other Visit Diagnoses       Encounter for screening for HIV       Relevant Orders   HIV antibody (with reflex)     Encounter for hepatitis C screening test for low risk patient       Relevant Orders   Hepatitis C Antibody     Screening for colon cancer       Relevant Orders   Cologuard     Screening for diabetes mellitus       Relevant Orders   Hemoglobin A1c     Screening for lipid disorders       Relevant Orders   Lipid panel     Screening mammogram for breast cancer       Relevant Orders   MM 3D SCREENING MAMMOGRAM BILATERAL BREAST       Return in about 1 year (around 01/19/2025) for CPE and Labs.    Adina Crandall, NP

## 2024-01-20 NOTE — Assessment & Plan Note (Signed)
 Pending TSH, A1c, lipid panel.  Patient has been given information for the healthy weight wellness clinic prior to this office visit.  Continue working on healthy lifestyle modifications

## 2024-01-20 NOTE — Assessment & Plan Note (Signed)
 Check basic labs inclusive of TSH and BNP.  If all negative consider discontinuing or lowering the dose of amlodipine .

## 2024-01-21 ENCOUNTER — Other Ambulatory Visit: Payer: Self-pay | Admitting: Nurse Practitioner

## 2024-01-21 LAB — HEPATITIS C ANTIBODY: Hepatitis C Ab: NONREACTIVE

## 2024-01-21 LAB — HIV ANTIBODY (ROUTINE TESTING W REFLEX): HIV 1&2 Ab, 4th Generation: NONREACTIVE

## 2024-01-25 ENCOUNTER — Ambulatory Visit: Payer: Self-pay | Admitting: Nurse Practitioner

## 2024-01-25 ENCOUNTER — Telehealth: Payer: Self-pay

## 2024-01-25 ENCOUNTER — Encounter: Payer: Self-pay | Admitting: Nurse Practitioner

## 2024-01-25 MED ORDER — AMLODIPINE BESYLATE 5 MG PO TABS: 5.0000 mg | ORAL_TABLET | Freq: Every day | ORAL | 0 refills | Status: DC

## 2024-01-25 NOTE — Telephone Encounter (Deleted)
Copied from CRM [id number]. Topic: Clinical - Red Word Triage >> Jan 25, 2024 11:37 AM Corin V wrote: [facility] that prompted transfer to Nurse Triage: Patient has been spitting up blood and yellow mucus since she came into the office on 01/20/24. She is only taking mucinex and prescribed medications.

## 2024-01-25 NOTE — Telephone Encounter (Signed)
Copied from CRM [id number]. Topic: Clinical - Medication Question >> Jan 25, 2024 11:39 AM Corin V wrote: Reason for CRM: Patient called back for her lab results. She is agreeable to decrease the amLODipine (NORVASC) 10 MG tablet prescription and would like that sent to [facility]. She is set to come in for a follow up 02/17/24.  She also wants to know if she should see her ENT doctor for her sinus headaches or who to follow up with on those.

## 2024-01-25 NOTE — Addendum Note (Signed)
Addended by: Eden Emms on: 01/25/2024 12:54 PM   Modules accepted: Orders

## 2024-01-25 NOTE — Telephone Encounter (Signed)
Chief Complaint: Headache, bloody and yellow phlegm Symptoms: see above Frequency: 3 weeks Pertinent Negatives: Patient denies see notes Disposition: [] ED /[] Urgent Care (no appt availability in office) / [x] Appointment(In office/virtual)/ []  [facility]/ [] Home Care/ [] Refused Recommended Disposition /[] [facility]/ []  Follow-up with PCP Additional Notes: Patient called in stating she has been experiencing headache and yellow/bloody phlegm she is having to cough up for roughly 3 weeks. Patient states the pain in her head is between her eyes. Patient would like further evaluation in office.    Copied from CRM [id number]. Topic: Clinical - Red Word Triage >> Jan 25, 2024 11:37 AM Corin V wrote: [facility] that prompted transfer to Nurse Triage: Patient has been spitting up blood and yellow mucus since she came into the office on 01/20/24. She is only taking mucinex and prescribed medications. Reason for Disposition  [1] MODERATE headache (e.g., interferes with normal activities) AND [2] present > 24 hours AND [3] unexplained  (Exceptions: analgesics not tried, typical migraine, or headache part of viral illness)  Answer Assessment - Initial Assessment Questions 1. LOCATION: "Where does it hurt?"      Between eyes 2. ONSET: "When did the headache start?" (Minutes, hours or days)      3 weeks  3. PATTERN: "Does the pain come and go, or has it been constant since it started?"     Intermittent 4. SEVERITY: "How bad is the pain?" and "What does it keep you from doing?"  (e.g., Scale 1-10; mild, moderate, or severe)   - MILD (1-3): doesn't interfere with normal activities    - MODERATE (4-7): interferes with normal activities or awakens from sleep    - SEVERE (8-10): excruciating pain, unable to do any normal activities        8/10 5. RECURRENT SYMPTOM: "Have you ever had headaches before?" If Yes, ask: "When was the last time?" and "What happened that time?"      N/a 6. CAUSE:  "What do you think is causing the headache?"     Unsure, Son has same symptoms 7. MIGRAINE: "Have you been diagnosed with migraine headaches?" If Yes, ask: "Is this headache similar?"      Yes 8. HEAD INJURY: "Has there been any recent injury to the head?"      No 9. OTHER SYMPTOMS: "Do you have any other symptoms?" (fever, stiff neck, eye pain, sore throat, cold symptoms)     Sleeping more than normal, headache, phlegm  Protocols used: Headache-A-AH

## 2024-01-25 NOTE — Telephone Encounter (Signed)
Noted

## 2024-01-25 NOTE — Telephone Encounter (Signed)
We will discontinue the 10mg  of amlodipine and start 5mg . She is followed by neurology for her headaches already

## 2024-01-25 NOTE — Progress Notes (Unsigned)
   Kathy Killian T. Estes Lehner, MD, CAQ Sports Medicine [facility] at [facility] [address] [place], [address]  Phone: [phone]  FAX: [phone]  Kathy Gamble - 48 y.o. female  MRN [id number]  Date of Birth: 06-02-1976  Date: 01/26/2024  PCP: Eden Emms, NP  Referral: Eden Emms, NP  No chief complaint on file.  Subjective:   Kathy Gamble is a 48 y.o. very pleasant female patient with There is no height or weight on file to calculate BMI. who presents with the following:  Kathy Gamble is a very pleasant patient who I have seen numerous times over the years.  She presents with acute illness.    Review of Systems is noted in the HPI, as appropriate  Objective:   LMP  (LMP Unknown)   GEN: No acute distress; alert,appropriate. PULM: Breathing comfortably in no respiratory distress PSYCH: Normally interactive.   Laboratory and Imaging Data:  Assessment and Plan:   ***

## 2024-01-26 ENCOUNTER — Ambulatory Visit
Admission: RE | Admit: 2024-01-26 | Discharge: 2024-01-26 | Disposition: A | Discharge status: Home / Self Care | Payer: Medicare Other | Source: Ambulatory Visit | Attending: Family Medicine | Admitting: Family Medicine

## 2024-01-26 ENCOUNTER — Other Ambulatory Visit: Payer: Self-pay | Admitting: Nurse Practitioner

## 2024-01-26 ENCOUNTER — Encounter: Payer: Self-pay | Admitting: Family Medicine

## 2024-01-26 ENCOUNTER — Ambulatory Visit (INDEPENDENT_AMBULATORY_CARE_PROVIDER_SITE_OTHER): Payer: Medicare Other | Admitting: Family Medicine

## 2024-01-26 VITALS — BP 108/80 | HR 84 | Temp 98.2°F | Ht 69.0 in | Wt 288.2 lb

## 2024-01-26 DIAGNOSIS — R059 Cough, unspecified: Secondary | ICD-10-CM | POA: Diagnosis not present

## 2024-01-26 DIAGNOSIS — R042 Hemoptysis: Secondary | ICD-10-CM | POA: Diagnosis not present

## 2024-01-26 DIAGNOSIS — J208 Acute bronchitis due to other specified organisms: Secondary | ICD-10-CM | POA: Diagnosis not present

## 2024-01-26 MED ORDER — DOXYCYCLINE HYCLATE 100 MG PO TABS: 100.0000 mg | ORAL_TABLET | Freq: Two times a day (BID) | ORAL | 0 refills | Status: DC

## 2024-01-26 NOTE — Telephone Encounter (Signed)
Contacted pt.  Reviewed information with pt and she verbalized understanding of script dosage change. Pt has no questions or concerns.

## 2024-01-28 ENCOUNTER — Other Ambulatory Visit: Payer: Self-pay

## 2024-01-28 NOTE — Telephone Encounter (Signed)
Copied from CRM [id number]. Topic: Clinical - Prescription Issue >> Jan 27, 2024  1:26 PM Elizebeth Brooking wrote: Reason for CRM: Patient husband called in regarding medication  gabapentin (NEURONTIN) 400 MG capsule would like to know the status of the refill and if it is getting sent to the pharmacy , is requesting a callback at [phone]

## 2024-02-01 NOTE — Addendum Note (Signed)
 Addended by: Donnamarie Poag on: 02/01/2024 12:41 PM   Modules accepted: Orders

## 2024-02-01 NOTE — Telephone Encounter (Signed)
 Don't see where this was given by you in the past ok to refill?

## 2024-02-02 ENCOUNTER — Telehealth: Payer: Self-pay

## 2024-02-02 ENCOUNTER — Ambulatory Visit: Payer: Self-pay | Admitting: Nurse Practitioner

## 2024-02-02 NOTE — Telephone Encounter (Signed)
 Patient called, left VM to return the call to the office to speak to the NT.      Summary: Bloody mucous and headaches   Copied From CRM [id number]. Reason for Triage: Patient does not want to be triaged again but advised I would still send a msg. She is still spitting up blood and mucous and has a splitting headache daily. Waiting for xray results to be read. She mentioned her bowel movements are now runny. Please follow up.

## 2024-02-02 NOTE — Telephone Encounter (Signed)
Routing to treating provider.

## 2024-02-02 NOTE — Telephone Encounter (Signed)
  Chief Complaint: Cough Symptoms: blood in mucous, HA Frequency: Ongoing, recent CXR Pertinent Negatives: Patient denies fever, dizziness, vision changes Disposition: [] ED /[] Urgent Care (no appt availability in office) / [x] Appointment(In office/virtual)/ []  [place]/ [] Home Care/ [] Refused Recommended Disposition /[] [place]/ []  Follow-up with PCP Additional Notes: Pt reports she feels she is not improving, cough is still productive with blood tinged sputum. Pt denies fever, dizziness, vision changes. OV scheduled tomorrow AM. This RN educated pt on home care, new-worsening symptoms, when to call back/seek emergent care. Pt verbalized understanding and agrees to plan.    Reason for Disposition  SEVERE coughing spells (e.g., whooping sound after coughing, vomiting after coughing)  Answer Assessment - Initial Assessment Questions 3. SPUTUM: "Describe the color of your sputum" (none, dry cough; clear, white, yellow, green)     Yes 4. HEMOPTYSIS: "Are you coughing up any blood?" If so ask: "How much?" (flecks, streaks, tablespoons, etc.)     Yes  6. FEVER: "Do you have a fever?" If Yes, ask: "What is your temperature, how was it measured, and when did it start?"     None  Protocols used: Cough - Acute Productive-A-AH

## 2024-02-02 NOTE — Telephone Encounter (Signed)
 Please remind Kathy Gamble that we looked at her chest x-ray in the office, and I do not think that she has pneumonia.  [name], would you please ask Radiology to go ahead and get a formal read on her chest x-ray.

## 2024-02-02 NOTE — Telephone Encounter (Signed)
 Copied from CRM [id number]. Topic: Clinical - Lab/Test Results >> Feb 02, 2024  4:06 PM Desma Mcgregor wrote: Reason for CRM: Pt calling about the xray of Chest done 2/12. Asking if the results can be read asap. Pt concerned about it and mentioned that he bowel movements are now runny so needs to know what's going on.

## 2024-02-03 ENCOUNTER — Ambulatory Visit: Payer: Medicare Other | Admitting: Nurse Practitioner

## 2024-02-03 ENCOUNTER — Other Ambulatory Visit: Payer: Self-pay | Admitting: Nurse Practitioner

## 2024-02-03 NOTE — Telephone Encounter (Signed)
 Left message for Tabria to return call to office.  If patient calls back, please relay message from Audria Nine, PCP.

## 2024-02-03 NOTE — Telephone Encounter (Signed)
 Copied from CRM [id number]. Topic: Clinical - Medication Refill >> Feb 03, 2024  8:28 AM Isabell A wrote: Most Recent Primary Care Visit:  Provider: Hannah Beat  Department: [facility]  Visit Type: ACUTE  Date: 01/26/2024  Medication: gabapentin (NEURONTIN) 400 MG capsule   Has the patient contacted their pharmacy? No (Agent: If no, request that the patient contact the pharmacy for the refill. If patient does not wish to contact the pharmacy document the reason why and proceed with request.) (Agent: If yes, when and what did the pharmacy advise?)  Is this the correct pharmacy for this prescription? Yes If no, delete pharmacy and type the correct one.  This is the patient's preferred pharmacy:  [id number] - [place], [place] - [address] AT [facility] OF [address] [facility] [address] [place] [address] Phone: [phone] Fax: [phone]   Has the prescription been filled recently? Yes  Is the patient out of the medication? Yes  Has the patient been seen for an appointment in the last year OR does the patient have an upcoming appointment? Yes  Can we respond through MyChart? No  Agent: Please be advised that Rx refills may take up to 3 business days. We ask that you follow-up with your pharmacy.

## 2024-02-03 NOTE — Telephone Encounter (Signed)
 Patient did not make the appointment can we see if she needs to reschedule

## 2024-02-03 NOTE — Telephone Encounter (Signed)
 Looks like Dr Patsy Lager has addressed the chest xray but it did come back looking normal.  The runny stools could be because of the antibiotic use. Is she taking the doxycycline as prescribed by Dr. Patsy Lager. How many runny stools is she having a day?  Is she taking the antibiotic with food? If not I would start doing that

## 2024-02-03 NOTE — Telephone Encounter (Signed)
 Spoke to pt, relayed matt's questions/response. Pt states she is taking doxycycline & has only 2 tablets remaining. Pt states she experiences 2 runny stools on daily basis. Pt states she has been taking meds with food. Pt asked if the issue could be from her possibly being dehydrated? Please advise. Call back # [phone]

## 2024-02-03 NOTE — Telephone Encounter (Signed)
 Please review 2nd part of message about runny stools before I call her.  Please advise.

## 2024-02-03 NOTE — Telephone Encounter (Signed)
 Left message to return call to our office.

## 2024-02-04 ENCOUNTER — Ambulatory Visit: Payer: Medicare Other | Admitting: Nurse Practitioner

## 2024-02-04 NOTE — Telephone Encounter (Signed)
 Noted

## 2024-02-04 NOTE — Telephone Encounter (Signed)
 I spoke with pt diarrhea has stopped and only complaint pt has now is still has H/A. Pt said excedrin helps little bit. Pt will take last abx this morning with food.Pt scheduled to see Audria Nine NP 02/04/24 at 10;40 with UC & ED precautions. Pt voiced understanding. Sending note to Dr Patsy Lager and Audria Nine NP as Lorain Childes.

## 2024-02-07 ENCOUNTER — Telehealth: Payer: Self-pay

## 2024-02-07 NOTE — Telephone Encounter (Signed)
 Copied from CRM [id number]. Topic: General - Other >> Feb 07, 2024 11:09 AM Rodman Pickle T wrote: Reason for CRM: patient is needing a call back regarding her left foot her foot is still swollen and she can barley walk on her foot she is needing a call back

## 2024-02-07 NOTE — Telephone Encounter (Signed)
 Unable to reach patient. Left voicemail to return call to our office.

## 2024-02-08 NOTE — Telephone Encounter (Signed)
 Received CRM stating pt called back returning [facility] call. Call back # [phone]

## 2024-02-08 NOTE — Telephone Encounter (Signed)
 Returned pt call. Pt complains of swollen left foot lasting 2 weeks.  She says that she can barely walk on her left foot but has been using compression socks and ice/rest.  Scheduled pt for OV for 02/09/23 @1040AM  with Bed[facility].

## 2024-02-10 ENCOUNTER — Ambulatory Visit (INDEPENDENT_AMBULATORY_CARE_PROVIDER_SITE_OTHER): Payer: Medicare Other | Admitting: Family Medicine

## 2024-02-10 ENCOUNTER — Encounter: Payer: Self-pay | Admitting: Family Medicine

## 2024-02-10 VITALS — BP 100/72 | HR 83 | Temp 97.9°F | Ht 69.0 in | Wt 286.2 lb

## 2024-02-10 DIAGNOSIS — I872 Venous insufficiency (chronic) (peripheral): Secondary | ICD-10-CM | POA: Insufficient documentation

## 2024-02-10 DIAGNOSIS — I8390 Asymptomatic varicose veins of unspecified lower extremity: Secondary | ICD-10-CM | POA: Diagnosis not present

## 2024-02-10 DIAGNOSIS — S93602A Unspecified sprain of left foot, initial encounter: Secondary | ICD-10-CM | POA: Insufficient documentation

## 2024-02-10 DIAGNOSIS — M79605 Pain in left leg: Secondary | ICD-10-CM | POA: Diagnosis not present

## 2024-02-10 MED ORDER — GABAPENTIN 400 MG PO CAPS: 400.0000 mg | ORAL_CAPSULE | Freq: Every day | ORAL | 1 refills | Status: DC

## 2024-02-10 NOTE — Assessment & Plan Note (Signed)
 She has chronic pain in left leg ongoing greater than 6 months per notes.  Patient is poor historian. The pain is possibly due to venous insufficiency and resulting swelling versus neuropathy.  She has previously been treated with gabapentin.  She states she has not been taking this regularly recently given prescription was out and she was using her husband's lower dose.  We will restart her on gabapentin 400 mg 2 at night and an additional 1 in the morning (she states this is how she was taking it without side effects, although the prescription was different)  If symptoms are not improving as expected she will follow-up with PCP.

## 2024-02-10 NOTE — Progress Notes (Signed)
 Patient ID: Kathy Gamble, female    DOB: [birth year], 49 y.o.   MRN: [id number]  This visit was conducted in person.  BP 100/72 (BP Location: Right Arm, Patient Position: Sitting, Cuff Size: Large)   Pulse 83   Temp 97.9 F (36.6 C) (Temporal)   Ht 5\' 9"  (1.753 m)   Wt 286 lb 4 oz (129.8 kg)   LMP  (LMP Unknown)   SpO2 99%   BMI 42.27 kg/m    CC:  Chief Complaint  Patient presents with   Foot Swelling    Left x 2 week    Subjective:   HPI: Kathy Gamble is a 48 y.o. female patient of Matt Cable presenting on 02/10/2024 for Foot Swelling (Left x 2 week)   New onset left foot, ankle swelling and soreness   worse in  last 2 weeks but ongoing for > 6 months  No fall, no injury, no activity change. Pain with weight bearing 8-9/10. No redness or skin change. Has been treating with ice, ibuprofen and Excedrin  Not elevating leg often.  Noted per past PCP note she has had left  leg pain  X-ray: IMPRESSION: 1. Diffuse subcutaneous edema, greatest within the distal left lower leg. No acute bony abnormality.    History of neuropathy on gabapentin ( helps with pain but has been out for > 2 weeks,, she states she takes 400 mg in Am, 800 mg at night), morbid obesity  Tried voltaren gel... helped minimally.  Relevant past medical, surgical, family and social history reviewed and updated as indicated. Interim medical history since our last visit reviewed. Allergies and medications reviewed and updated. Outpatient Medications Prior to Visit  Medication Sig Dispense Refill   albuterol (VENTOLIN HFA) 108 (90 Base) MCG/ACT inhaler Inhale 1-2 puffs into the lungs every 6 hours as needed for wheezing and or shortness of breah. 8.5 g 0   amLODipine (NORVASC) 5 MG tablet Take 1 tablet (5 mg total) by mouth daily. 90 tablet 0   Erenumab-aooe (AIMOVIG) 140 MG/ML SOAJ Inject into the skin.     hydrOXYzine (ATARAX) 50 MG tablet daily.     levocetirizine (XYZAL) 5 MG tablet Take 1 tablet (5 mg  total) by mouth every evening. 90 tablet 2   losartan (COZAAR) 25 MG tablet Take 1 tablet (25 mg total) by mouth daily. 90 tablet 3   montelukast (SINGULAIR) 10 MG tablet TAKE ONE TABLET BY MOUTH ONCE DAILY FOR sinuses 90 tablet 3   Multiple Vitamin (MULTIVITAMIN) capsule Take 1 capsule by mouth daily.     omeprazole (PRILOSEC) 20 MG capsule TAKE 1 CAPSULE BY MOUTH EVERY DAY 90 capsule 0   QUEtiapine (SEROQUEL) 25 MG tablet Take 25 mg at night for a week then increase to 50 mg at night and continue that dose     tiZANidine (ZANAFLEX) 4 MG tablet Take 1 tablet (4 mg total) by mouth at bedtime as needed for muscle spasms. 14 tablet 0   venlafaxine (EFFEXOR) 75 MG tablet Take 75 mg by mouth at bedtime.     verapamil (CALAN) 40 MG tablet Take 1 tablet by mouth 2 (two) times daily.     zonisamide (ZONEGRAN) 100 MG capsule Take 100 mg by mouth at bedtime.     gabapentin (NEURONTIN) 400 MG capsule Take 2 capsules (800 mg total) by mouth at bedtime. 30 capsule 1   doxycycline (VIBRA-TABS) 100 MG tablet Take 1 tablet (100 mg total) by mouth 2 (two)  times daily. 20 tablet 0   No facility-administered medications prior to visit.     Per HPI unless specifically indicated in ROS section below Review of Systems  Constitutional:  Negative for fatigue and fever.  HENT:  Negative for congestion.   Eyes:  Negative for pain.  Respiratory:  Negative for cough and shortness of breath.   Cardiovascular:  Positive for leg swelling. Negative for chest pain and palpitations.  Gastrointestinal:  Negative for abdominal pain.  Genitourinary:  Negative for dysuria and vaginal bleeding.  Musculoskeletal:  Positive for joint swelling. Negative for back pain.  Neurological:  Negative for syncope, light-headedness and headaches.  Psychiatric/Behavioral:  Negative for dysphoric mood.    Objective:  BP 100/72 (BP Location: Right Arm, Patient Position: Sitting, Cuff Size: Large)   Pulse 83   Temp 97.9 F (36.6 C)  (Temporal)   Ht 5\' 9"  (1.753 m)   Wt 286 lb 4 oz (129.8 kg)   LMP  (LMP Unknown)   SpO2 99%   BMI 42.27 kg/m   Wt Readings from Last 3 Encounters:  02/10/24 286 lb 4 oz (129.8 kg)  01/26/24 288 lb 4 oz (130.7 kg)  01/20/24 286 lb (129.7 kg)      Physical Exam Constitutional:      General: She is not in acute distress.    Appearance: Normal appearance. She is well-developed. She is not ill-appearing or toxic-appearing.  HENT:     Head: Normocephalic.     Right Ear: Hearing, tympanic membrane, ear canal and external ear normal. Tympanic membrane is not erythematous, retracted or bulging.     Left Ear: Hearing, tympanic membrane, ear canal and external ear normal. Tympanic membrane is not erythematous, retracted or bulging.     Nose: No mucosal edema or rhinorrhea.     Right Sinus: No maxillary sinus tenderness or frontal sinus tenderness.     Left Sinus: No maxillary sinus tenderness or frontal sinus tenderness.     Mouth/Throat:     Pharynx: Uvula midline.  Eyes:     General: Lids are normal. Lids are everted, no foreign bodies appreciated.     Conjunctiva/sclera: Conjunctivae normal.     Pupils: Pupils are equal, round, and reactive to light.  Neck:     Thyroid: No thyroid mass or thyromegaly.     Vascular: No carotid bruit.     Trachea: Trachea normal.  Cardiovascular:     Rate and Rhythm: Normal rate and regular rhythm.     Pulses: Normal pulses.          Dorsalis pedis pulses are 2+ on the left side.     Heart sounds: Normal heart sounds, S1 normal and S2 normal. No murmur heard.    No friction rub. No gallop.  Pulmonary:     Effort: Pulmonary effort is normal. No tachypnea or respiratory distress.     Breath sounds: Normal breath sounds. No decreased breath sounds, wheezing, rhonchi or rales.  Abdominal:     General: Bowel sounds are normal.     Palpations: Abdomen is soft.     Tenderness: There is no abdominal tenderness.  Musculoskeletal:     Cervical back:  Normal range of motion and neck supple.     Left ankle: Swelling present. No deformity. No tenderness. Normal range of motion.     Left Achilles Tendon: Normal.     Left foot: Normal range of motion. Swelling present. No deformity, bunion or Charcot foot.     Comments:  Spider varicose veins in calf and ankle   Diffuse swelling in left lower leg, ankle and foot  Feet:     Left foot:     Skin integrity: Skin integrity normal. No erythema or warmth.     Comments: Ttp over dorsal foot Skin:    General: Skin is warm and dry.     Findings: No rash.  Neurological:     Mental Status: She is alert.  Psychiatric:        Mood and Affect: Mood is not anxious or depressed.        Speech: Speech normal.        Behavior: Behavior normal. Behavior is cooperative.        Thought Content: Thought content normal.        Judgment: Judgment normal.       Results for orders placed or performed in visit on 01/20/24  Hepatitis C Antibody   Collection Time: 01/20/24 11:37 AM  Result Value Ref Range   Hepatitis C Ab NON-REACTIVE NON-REACTIVE  HIV antibody (with reflex)   Collection Time: 01/20/24 11:37 AM  Result Value Ref Range   HIV 1&2 Ab, 4th Generation NON-REACTIVE NON-REACTIVE  CBC   Collection Time: 01/20/24 11:37 AM  Result Value Ref Range   WBC 7.5 4.0 - 10.5 K/uL   RBC 4.56 3.87 - 5.11 Mil/uL   Platelets 296.0 150.0 - 400.0 K/uL   Hemoglobin 13.6 12.0 - 15.0 g/dL   HCT 82.9 56.2 - 13.0 %   MCV 89.1 78.0 - 100.0 fl   MCHC 33.4 30.0 - 36.0 g/dL   RDW 86.5 78.4 - 69.6 %  Brain natriuretic peptide   Collection Time: 01/20/24 11:37 AM  Result Value Ref Range   Pro B Natriuretic peptide (BNP) 17.0 0.0 - 100.0 pg/mL  Comprehensive metabolic panel   Collection Time: 01/20/24 11:37 AM  Result Value Ref Range   Sodium 141 135 - 145 mEq/L   Potassium 4.0 3.5 - 5.1 mEq/L   Chloride 107 96 - 112 mEq/L   CO2 26 19 - 32 mEq/L   Glucose, Bld 91 70 - 99 mg/dL   BUN 17 6 - 23 mg/dL    Creatinine, Ser 2.95 0.40 - 1.20 mg/dL   Total Bilirubin 0.4 0.2 - 1.2 mg/dL   Alkaline Phosphatase 141 (H) 39 - 117 U/L   AST 12 0 - 37 U/L   ALT 11 0 - 35 U/L   Total Protein 6.3 6.0 - 8.3 g/dL   Albumin 3.8 3.5 - 5.2 g/dL   GFR 284.13 >24.40 mL/min   Calcium 8.7 8.4 - 10.5 mg/dL  Hemoglobin N0U   Collection Time: 01/20/24 11:37 AM  Result Value Ref Range   Hgb A1c MFr Bld 5.2 4.6 - 6.5 %  TSH   Collection Time: 01/20/24 11:37 AM  Result Value Ref Range   TSH 1.69 0.35 - 5.50 uIU/mL  Lipid panel   Collection Time: 01/20/24 11:37 AM  Result Value Ref Range   Cholesterol 154 0 - 200 mg/dL   Triglycerides 725.3 (H) 0.0 - 149.0 mg/dL   HDL 66.44 >03.47 mg/dL   VLDL 42.5 0.0 - 95.6 mg/dL   LDL Cholesterol 73 0 - 99 mg/dL   Total CHOL/HDL Ratio 3    NonHDL 106.95     Assessment and Plan  Foot sprain, left, initial encounter Assessment & Plan: Acute, possible new foot sprain in left dorsal foot.  Recommended topical Voltaren gel 4 times a day for  pain and inflammation, start home physical therapy for foot stretching.   Chronic venous insufficiency of lower extremity Assessment & Plan: The swelling in her left leg appears chronic and likely related to venous insufficiency on the left.  There is no clear sign of DVT, no calf tenderness. Encouraged her to wear compression hose on left leg and elevate her leg above her heart.  Encouraged weight loss and regular exercise as tolerated.  If symptoms continue could consider venous ultrasound or vascular referral.   Varicose veins of ankle  Left leg pain Assessment & Plan: She has chronic pain in left leg ongoing greater than 6 months per notes.  Patient is poor historian. The pain is possibly due to venous insufficiency and resulting swelling versus neuropathy.  She has previously been treated with gabapentin.  She states she has not been taking this regularly recently given prescription was out and she was using her husband's  lower dose.  We will restart her on gabapentin 400 mg 2 at night and an additional 1 in the morning (she states this is how she was taking it without side effects, although the prescription was different)  If symptoms are not improving as expected she will follow-up with PCP.   Other orders -     Gabapentin; Take 1 capsule (400 mg total) by mouth at bedtime. 1 caspule in AM and 2 capsules in PM  Dispense: 90 capsule; Refill: 1    No follow-ups on file.   Kerby Nora, MD

## 2024-02-10 NOTE — Assessment & Plan Note (Signed)
 Acute, possible new foot sprain in left dorsal foot.  Recommended topical Voltaren gel 4 times a day for pain and inflammation, start home physical therapy for foot stretching.

## 2024-02-10 NOTE — Patient Instructions (Signed)
 Start home stretching for possible foot strain.  Restart gabapentin.  Restart voltaren gel  4 times daily for next 2-4 weeks.  Start elevating left leg above heart when sitting. Wear compression hose when up on feet.

## 2024-02-10 NOTE — Assessment & Plan Note (Signed)
 The swelling in her left leg appears chronic and likely related to venous insufficiency on the left.  There is no clear sign of DVT, no calf tenderness. Encouraged her to wear compression hose on left leg and elevate her leg above her heart.  Encouraged weight loss and regular exercise as tolerated.  If symptoms continue could consider venous ultrasound or vascular referral.

## 2024-02-17 ENCOUNTER — Ambulatory Visit: Payer: Self-pay | Admitting: Nurse Practitioner

## 2024-02-17 ENCOUNTER — Emergency Department
Admission: EM | Admit: 2024-02-17 | Discharge: 2024-02-17 | Disposition: A | Discharge status: Home / Self Care | Attending: Emergency Medicine | Admitting: Emergency Medicine

## 2024-02-17 ENCOUNTER — Encounter: Payer: Self-pay | Admitting: Emergency Medicine

## 2024-02-17 ENCOUNTER — Emergency Department

## 2024-02-17 ENCOUNTER — Other Ambulatory Visit: Payer: Self-pay

## 2024-02-17 ENCOUNTER — Ambulatory Visit: Payer: Medicare Other | Admitting: Nurse Practitioner

## 2024-02-17 DIAGNOSIS — R600 Localized edema: Secondary | ICD-10-CM | POA: Diagnosis not present

## 2024-02-17 DIAGNOSIS — M7989 Other specified soft tissue disorders: Secondary | ICD-10-CM | POA: Diagnosis not present

## 2024-02-17 DIAGNOSIS — M79662 Pain in left lower leg: Secondary | ICD-10-CM | POA: Diagnosis not present

## 2024-02-17 DIAGNOSIS — I10 Essential (primary) hypertension: Secondary | ICD-10-CM | POA: Insufficient documentation

## 2024-02-17 DIAGNOSIS — M25472 Effusion, left ankle: Secondary | ICD-10-CM

## 2024-02-17 DIAGNOSIS — R609 Edema, unspecified: Secondary | ICD-10-CM | POA: Diagnosis not present

## 2024-02-17 DIAGNOSIS — M25572 Pain in left ankle and joints of left foot: Secondary | ICD-10-CM | POA: Diagnosis not present

## 2024-02-17 DIAGNOSIS — R2242 Localized swelling, mass and lump, left lower limb: Secondary | ICD-10-CM | POA: Diagnosis not present

## 2024-02-17 LAB — CBC WITH DIFFERENTIAL/PLATELET
Abs Immature Granulocytes: 0.04 10*3/uL (ref 0.00–0.07)
Basophils Absolute: 0.1 10*3/uL (ref 0.0–0.1)
Basophils Relative: 1 %
Eosinophils Absolute: 0.1 10*3/uL (ref 0.0–0.5)
Eosinophils Relative: 1 %
HCT: 37.8 % (ref 36.0–46.0)
Hemoglobin: 12.8 g/dL (ref 12.0–15.0)
Immature Granulocytes: 1 %
Lymphocytes Relative: 24 %
Lymphs Abs: 2 10*3/uL (ref 0.7–4.0)
MCH: 29.4 pg (ref 26.0–34.0)
MCHC: 33.9 g/dL (ref 30.0–36.0)
MCV: 86.9 fL (ref 80.0–100.0)
Monocytes Absolute: 0.5 10*3/uL (ref 0.1–1.0)
Monocytes Relative: 6 %
Neutro Abs: 5.5 10*3/uL (ref 1.7–7.7)
Neutrophils Relative %: 67 %
Platelets: 273 10*3/uL (ref 150–400)
RBC: 4.35 MIL/uL (ref 3.87–5.11)
RDW: 13.4 % (ref 11.5–15.5)
WBC: 8.1 10*3/uL (ref 4.0–10.5)
nRBC: 0 % (ref 0.0–0.2)

## 2024-02-17 LAB — BASIC METABOLIC PANEL
Anion gap: 8 (ref 5–15)
BUN: 21 mg/dL — ABNORMAL HIGH (ref 6–20)
CO2: 23 mmol/L (ref 22–32)
Calcium: 8.6 mg/dL — ABNORMAL LOW (ref 8.9–10.3)
Chloride: 108 mmol/L (ref 98–111)
Creatinine, Ser: 0.66 mg/dL (ref 0.44–1.00)
GFR, Estimated: >60 mL/min (ref >60)
Glucose, Bld: 102 mg/dL — ABNORMAL HIGH (ref 70–99)
Potassium: 3.5 mmol/L (ref 3.5–5.1)
Sodium: 139 mmol/L (ref 135–145)

## 2024-02-17 LAB — BRAIN NATRIURETIC PEPTIDE: B Natriuretic Peptide: 29.9 pg/mL (ref 0.0–100.0)

## 2024-02-17 NOTE — Telephone Encounter (Signed)
 Husband Mr. Kuklinski calling back. Pt was triaged this AM and was advised to go to the ED for leg pain and swelling. RN at that time also advised pt stay off that leg. Husband wondering how pt will be able to get to the ED if she can't bear weight on that leg. RN recommended husband help her into the car or the pt can use crutches or an assistive device if they have one at home. Husband states he uses a walker to ambulate himself. RN advised husband call 911 for safe transport to the hospital to which her verbalized understanding.  Copied from CRM [id number]. Topic: Clinical - Red Word Triage >> Feb 17, 2024  8:03 AM Kathy Gamble wrote: Patient states her left leg is swollen pretty bad to where you can see her veins popping out.

## 2024-02-17 NOTE — Telephone Encounter (Signed)
 Attempted to call pt back to see if she was able to contact husband or if I needed to call 911 for her.  Phone rang the stopped after 2 -3 rings: was not able to leave message because no voicemail came on.

## 2024-02-17 NOTE — Discharge Instructions (Signed)
 I suspect you may have possibly sprained your left ankle from your fall.  Otherwise no evidence of fracture and no evidence of blood clot in your leg.  Blood work is otherwise reassuring today.  Please follow-up with your primary care provider as needed for ongoing outpatient care.  I would like you to wear your stocking on the left leg to help with swelling for the next several days.  You can also apply ice to the ankle to help with swelling for 10 to 15 minutes at a time several times per day.

## 2024-02-17 NOTE — Telephone Encounter (Signed)
 noted

## 2024-02-17 NOTE — Telephone Encounter (Signed)
 Called pt again to inquire if she was able to contact her husband or if she needed me to call 911: no answer left voicemail

## 2024-02-17 NOTE — ED Triage Notes (Signed)
 Presents via EMS from home  Presents with left ankle pain w/o injury  Min swelling swelling noted  Good pulses

## 2024-02-17 NOTE — ED Provider Notes (Signed)
 [facility] Provider Note    Event Date/Time   First MD Initiated Contact with Patient 02/17/24 1038     (approximate)   History   Ankle Pain (/)   HPI Kathy Gamble is a 48 y.o. female who has history of HTN presenting today for left ankle pain.  Patient notes pain and swelling to her left ankle and left leg that has been going on for several days now.  Reported 1 fall multiple days ago but was unsure if she actually hurt her ankle or not.  Swelling has gotten worse in her leg with pain throughout.  Denies any pain in her foot.  Denies any other trauma.  Otherwise, no fever, chest pain, shortness of breath, abdominal pain.  No prior history of heart failure.  No prior history of blood clots.     Physical Exam   Triage Vital Signs: ED Triage Vitals  Encounter Vitals Group     BP 02/17/24 1050 123/81     Systolic BP Percentile --      Diastolic BP Percentile --      Pulse Rate 02/17/24 1050 87     Resp 02/17/24 1050 20     Temp 02/17/24 1050 98.1 F (36.7 C)     Temp src --      SpO2 02/17/24 1050 100 %     Weight 02/17/24 1043 286 lb 2.5 oz (129.8 kg)     Height 02/17/24 1043 5\' 9"  (1.753 m)     Head Circumference --      Peak Flow --      Pain Score --      Pain Loc --      Pain Education --      Exclude from Growth Chart --     Most recent vital signs: Vitals:   02/17/24 1050  BP: 123/81  Pulse: 87  Resp: 20  Temp: 98.1 F (36.7 C)  SpO2: 100%   I have reviewed the vital signs. General:  Awake, alert, no acute distress. Head:  Normocephalic, Atraumatic. EENT:  PERRL, EOMI, Oral mucosa pink and moist, Neck is supple. Cardiovascular: Regular rate, 2+ distal pulses. Respiratory:  Normal respiratory effort, symmetrical expansion, no distress.   Extremities:  Moving all four extremities through full ROM without pain.   Neuro:  Alert and oriented.  Interacting appropriately.   Skin:  Warm, dry, no rash.  Mild edema noted to bilateral  lower extremities which is slightly worse on the left lower extremity.  No significant tenderness palpation or erythema present Psych: Appropriate affect.    ED Results / Procedures / Treatments   Labs (all labs ordered are listed, but only abnormal results are displayed) Labs Reviewed  BASIC METABOLIC PANEL - Abnormal; Notable for the following components:      Result Value   Glucose, Bld 102 (*)    BUN 21 (*)    Calcium 8.6 (*)    All other components within normal limits  CBC WITH DIFFERENTIAL/PLATELET  BRAIN NATRIURETIC PEPTIDE     EKG    RADIOLOGY Independently interpreted ultrasound of left leg and left ankle x-ray with no acute pathology   PROCEDURES:  Critical Care performed: No  Procedures   MEDICATIONS ORDERED IN ED: Medications - No data to display   IMPRESSION / MDM / ASSESSMENT AND PLAN / ED COURSE  I reviewed the triage vital signs and the nursing notes.  Differential diagnosis includes, but is not limited to, ankle fracture, DVT, soft tissue swelling, ankle sprain, CHF related edema, medication related edema  Patient's presentation is most consistent with acute complicated illness / injury requiring diagnostic workup.  Patient is a 48 year old female presenting today for left ankle pain and swelling which is new over the past several days.  Exam shows swelling throughout the left lower extremity but no specific tenderness palpation.  Given her fall, will get x-ray to rule out any evidence of fracture.  More concern for possible DVT and will get ultrasound for follow-up imaging.  Also possible that this is related to her chronic amlodipine use and just slightly more severe today.  X-ray negative for fracture.  Ultrasound shows no evidence of DVT.  Laboratory workup otherwise reassuring and no elevation in BNP to suggest edema related to heart failure.  Suspect likely either ankle swelling secondary to sprain or potential  amlodipine induced edema.  Recommend her wear stockings at home and ice as needed to the ankle.  Follow-up with PCP for ongoing outpatient management.     FINAL CLINICAL IMPRESSION(S) / ED DIAGNOSES   Final diagnoses:  Left ankle swelling     Rx / DC Orders   ED Discharge Orders     None        Note:  This document was prepared using Dragon voice recognition software and may include unintentional dictation errors.   Janith Lima, MD 02/17/24 [phone]

## 2024-02-17 NOTE — Telephone Encounter (Signed)
 Copied from CRM [id number]. Topic: Clinical - Red Word Triage >> Feb 17, 2024  8:02 AM Kathryne Eriksson wrote: Red Word that prompted transfer to Nurse Triage: Swollen Left Leg >> Feb 17, 2024  8:03 AM Kathryne Eriksson wrote: Patient states her left leg is swollen pretty bad to where you can see her veins popping out.   Chief Complaint: leg swelling Symptoms: left leg swelling from toes to thigh, numbness & tingling in leg, at times with movement calf area is 10/10 pain, at rest 8/10 pain entire leg, SOB Frequency: x2 weeks and has worsened Pertinent Negatives: Patient denies chest pain Disposition: [x] ED /[] Urgent Gamble (no appt availability in office) / [] Appointment(In office/virtual)/ []  Kathy Gamble/ [] Home Gamble/ [] Refused Recommended Disposition /[] [facility]/ []  Follow-up with PCP Additional Notes: pt stated left leg is extremely swollen, able to see purple veins & some bruising, redness to leg.  C/o SOB at times.  Pt does states she is out of inhaler and gets SOB when seasons change.  Mobility is limited due to swelling and pain.  Referred pt to ER: pt stated she was going to call her husband to see if he could take her to the ER.  Offered to call 911 but stated she would call her husband 1st.   Reason for Disposition  [1] Can't walk or can barely walk AND [2] new-onset  Answer Assessment - Initial Assessment Questions 1. ONSET: "When did the swelling start?" (e.g., minutes, hours, days)     2 weeks and worsening 2. LOCATION: "What part of the leg is swollen?"  "Are both legs swollen or just one leg?"     Left leg swelling from toes high 3. SEVERITY: "How bad is the swelling?" (e.g., localized; mild, moderate, severe)   - Localized: Small area of swelling localized to one leg.   - MILD pedal edema: Swelling limited to foot and ankle, pitting edema < 1/4 inch (6 mm) deep, rest and elevation eliminate most or all swelling.   - MODERATE edema: Swelling of lower leg to knee,  pitting edema > 1/4 inch (6 mm) deep, rest and elevation only partially reduce swelling.   - SEVERE edema: Swelling extends above knee, facial or hand swelling present.     Severe 7/8 swelling 4. REDNESS: "Does the swelling look red or infected?"     Purple bruises, veins are purple and redness to leg 5. PAIN: "Is the swelling painful to touch?" If Yes, ask: "How painful is it?"   (Scale 1-10; mild, moderate or severe)     8/10 and at night 10/10 6. FEVER: "Do you have a fever?" If Yes, ask: "What is it, how was it measured, and when did it start?"      no 7. CAUSE: "What do you think is causing the leg swelling?"     unknown 8. MEDICAL HISTORY: "Do you have a history of blood clots (e.g., DVT), cancer, heart failure, kidney disease, or liver failure?"     no 9. RECURRENT SYMPTOM: "Have you had leg swelling before?" If Yes, ask: "When was the last time?" "What happened that time?"     no 10. OTHER SYMPTOMS: "Do you have any other symptoms?" (e.g., chest pain, difficulty breathing)       SOB at times -ressure on leg,  11. PREGNANCY: "Is there any chance you are pregnant?" "When was your last menstrual period?"       no  Protocols used: Leg Swelling and Edema-A-AH

## 2024-02-18 ENCOUNTER — Other Ambulatory Visit: Payer: Self-pay | Admitting: Nurse Practitioner

## 2024-02-18 DIAGNOSIS — J302 Other seasonal allergic rhinitis: Secondary | ICD-10-CM

## 2024-02-22 ENCOUNTER — Ambulatory Visit (INDEPENDENT_AMBULATORY_CARE_PROVIDER_SITE_OTHER): Payer: Medicare Other | Admitting: Nurse Practitioner

## 2024-02-22 ENCOUNTER — Encounter: Payer: Self-pay | Admitting: Nurse Practitioner

## 2024-02-22 VITALS — BP 132/84 | HR 83 | Temp 97.9°F | Ht 69.0 in | Wt 288.0 lb

## 2024-02-22 DIAGNOSIS — I10 Essential (primary) hypertension: Secondary | ICD-10-CM

## 2024-02-22 DIAGNOSIS — R600 Localized edema: Secondary | ICD-10-CM

## 2024-02-22 DIAGNOSIS — M25572 Pain in left ankle and joints of left foot: Secondary | ICD-10-CM | POA: Diagnosis not present

## 2024-02-22 MED ORDER — LOSARTAN POTASSIUM 50 MG PO TABS: 50.0000 mg | ORAL_TABLET | Freq: Every day | ORAL | 0 refills | Status: DC

## 2024-02-22 NOTE — Patient Instructions (Signed)
 Nice to see you today I want you to stop the amlodipine 5mg  medication I am going to increase the losartan to 50mg  a day I sent in the albuterol inhaler on 02/19/2024 Follow up with me in 1 month, sooner if you need me

## 2024-02-22 NOTE — Assessment & Plan Note (Signed)
 Patient recently seen in the ED for same.  Improving along with swelling.  Did review imaging of chest x-ray and ultrasound of lower extremity.  Also reviewed labs and ED note.

## 2024-02-22 NOTE — Assessment & Plan Note (Signed)
 Patient was previously on amlodipine 10 and losartan 25.  We did decrease amlodipine to 5 mg to see if this helps with lower extremity edema.  Patient still experiencing edema will take patient off of amlodipine altogether and increase losartan from 25 mg to 50 mg daily.

## 2024-02-22 NOTE — Progress Notes (Signed)
 Established Patient Office Visit  Subjective   Patient ID: Kathy Gamble, female    DOB: June 06, 1976  Age: 48 y.o. MRN: [id number]  Chief Complaint  Patient presents with   Hypertension   Leg Swelling       HTN: patient was seen by me on 01/25/2024 and she was still c/o of lower extremity edema. She was maintained on amlodipine 10mg  and losartan 25mg  daily. She was instructed to reduce the amlodipine to 5mg  daily and here for a follow up. States that she is unsure if the swelling has decreased   ED follow up" patient was seen in the emergency department on 02/17/2024 for left ankle swelling.  Patient endorses falling a few days but was unsure whether she hurt the ankle or not.  She underwent basic blood work and ultrasound rule out DVT.  Labs were grossly normal with a negative BNP.  Ultrasound of the left lower extremity no evidence of DVT.  X-ray of the left ankle noted mild lateral soft tissue swelling but no other abnormality.  Of note patient's blood pressure was within normal limits during emergency department visit. States that she has been using a reusable ice pack that has helped. She has been using voltaren gel that has helped    Review of Systems  Constitutional:  Negative for chills and fever.  Respiratory:  Negative for shortness of breath.   Cardiovascular:  Positive for leg swelling. Negative for chest pain.  Neurological:  Positive for headaches.      Objective:     BP 132/84   Pulse 83   Temp 97.9 F (36.6 C) (Temporal)   Ht 5\' 9"  (1.753 m)   Wt 288 lb (130.6 kg)   LMP  (LMP Unknown)   SpO2 99%   BMI 42.53 kg/m  BP Readings from Last 3 Encounters:  02/22/24 132/84  02/17/24 123/81  02/10/24 100/72   Wt Readings from Last 3 Encounters:  02/22/24 288 lb (130.6 kg)  02/17/24 286 lb 2.5 oz (129.8 kg)  02/10/24 286 lb 4 oz (129.8 kg)   SpO2 Readings from Last 3 Encounters:  02/22/24 99%  02/17/24 100%  02/10/24 99%      Physical Exam Vitals and  nursing note reviewed.  Constitutional:      Appearance: Normal appearance.  Cardiovascular:     Rate and Rhythm: Normal rate and regular rhythm.     Pulses:          Posterior tibial pulses are 2+ on the right side and 2+ on the left side.     Heart sounds: Normal heart sounds.  Pulmonary:     Effort: Pulmonary effort is normal.     Breath sounds: Normal breath sounds.  Musculoskeletal:     Right lower leg: 2+ Pitting Edema present.     Left lower leg: 2+ Pitting Edema (L>R) present.  Neurological:     Mental Status: She is alert.      No results found for any visits on 02/22/24.    The 10-year ASCVD risk score (Arnett DK, et al., 2019) is: 1.1%    Assessment & Plan:   Problem List Items Addressed This Visit       Cardiovascular and Mediastinum   Primary hypertension   Patient was previously on amlodipine 10 and losartan 25.  We did decrease amlodipine to 5 mg to see if this helps with lower extremity edema.  Patient still experiencing edema will take patient off of amlodipine altogether and  increase losartan from 25 mg to 50 mg daily.      Relevant Medications   losartan (COZAAR) 50 MG tablet     Other   Bilateral lower extremity edema - Primary   History of the same if some improvement it is mild.  Will discontinue amlodipine altogether patient is wearing compression garments continue continue elevating legs when able.      Acute left ankle pain   Patient recently seen in the ED for same.  Improving along with swelling.  Did review imaging of chest x-ray and ultrasound of lower extremity.  Also reviewed labs and ED note.       Return in about 4 weeks (around 03/21/2024) for BP recheck.    Audria Nine, NP

## 2024-02-22 NOTE — Assessment & Plan Note (Signed)
 History of the same if some improvement it is mild.  Will discontinue amlodipine altogether patient is wearing compression garments continue continue elevating legs when able.

## 2024-02-23 ENCOUNTER — Telehealth: Payer: Self-pay

## 2024-02-23 LAB — COLOGUARD

## 2024-02-23 NOTE — Telephone Encounter (Signed)
 She needs to stop taking amlodpine all together

## 2024-02-23 NOTE — Telephone Encounter (Signed)
 Copied from CRM [id number]. Topic: Clinical - Medication Question >> Feb 23, 2024  2:30 PM Jon Gills C wrote: Reason for CRM: Patient husband called in wanting to know the medication  amLODipine (NORVASC) if she needed to stop taking the 10 mg or the 5 mg or both will like a callback regarding this at [phone]

## 2024-02-24 NOTE — Telephone Encounter (Signed)
 Left message to return call to our office.

## 2024-02-25 NOTE — Telephone Encounter (Signed)
 Returned pt call.  Advised pt (per PCP) to stop taking amlodipine altogether.  Pt asked if she should stop taking the big one and small one.  Verbalized instructions to pt 3x.  Pt asked when she would start taking medication again.  Informed pt that treatment plans can be discussed at her next follow up appt 03/06/24 but as of now the instruction from PCP is to stop amlodipine 10mg  and 5mg .  Pt verbalized understanding. No further questions or concerns.

## 2024-02-28 ENCOUNTER — Telehealth: Payer: Self-pay | Admitting: Nurse Practitioner

## 2024-02-28 NOTE — Telephone Encounter (Signed)
 Copied from CRM [id number]. Topic: Clinical - Medical Advice >> Feb 28, 2024  1:58 PM Fredrich Romans wrote: Reason for CRM: Patient stated that her foot is still really painful  She is  wondering if she needs some  type of pain medicine.She said that pain level is at a 12 or higher to walk on it.

## 2024-02-28 NOTE — Telephone Encounter (Signed)
 Copied from CRM [id number]. Topic: Referral - Question >> Feb 28, 2024  1:57 PM Fredrich Romans wrote: Reason for CRM: Patient called in and asked about provider sending a referral to a vein specialist for  her.She is wondering if the referral was sent out?

## 2024-02-29 ENCOUNTER — Telehealth: Payer: Self-pay | Admitting: Nurse Practitioner

## 2024-02-29 DIAGNOSIS — R600 Localized edema: Secondary | ICD-10-CM

## 2024-02-29 NOTE — Telephone Encounter (Signed)
 I did not do a referral. Would she like [facility] or [facility]

## 2024-02-29 NOTE — Telephone Encounter (Signed)
 I see where patient has been seen for her left foot but not her right foot.  Does she want to go to [facility]  She can try ice on the foot and voltaren gel to see if that helps   Defer headache to patients neurologist

## 2024-02-29 NOTE — Telephone Encounter (Addendum)
 Unable to reach pt by phone and left v/m requesting pt to cb [phone]. I spoke with pts husband who put pt on phone. Pt said same pain and swelling she has had for 3 - 4 wks.rt ankle and foot hurts worse when walking and when trying to sleep at night. Pain level at night is 12; now pain level is 7. Pt said pain goes from sharp to dull. Pt is elevating her legs on and off during the day. Pt wears compression hose when goes out of the house but pt said they do not help. Pt has been alternating tylenol 500 mg and ibuprofen 400 mg; pt also taking excedrin and goody powders and nothing is helping the pain.pt requesting med for pain that is not OTC. , pt also has H/A in left eye for 3 days. Sharp pain that comes and goes in lt eye; no blurred or double vision. Dr Malvin Johns told pt may be due to taking tylenol. Pt also wants to know if referral to vein specialist has been done yet.pt request cb after note reviewed by Iven Finn NP. [facility] [address].

## 2024-02-29 NOTE — Telephone Encounter (Signed)
 Left message to return call to our office.

## 2024-02-29 NOTE — Telephone Encounter (Signed)
 Duplicate message. Please see additional encounter for documentation.

## 2024-02-29 NOTE — Telephone Encounter (Signed)
 Copied from CRM [id number]. Topic: General - Other >> Feb 29, 2024  1:27 PM Gurney Maxin H wrote: Reason for CRM: Patient returning call to office regarding message from provider, patient states she would like to go to the [place] location based of the question from [place]. Agent also relayed message to patient and husband as stated by provider, husband acknowledged.

## 2024-03-01 NOTE — Telephone Encounter (Signed)
 Returned pt call and advised pt to try and ice foot and use Voltaren gel.  Pt states that she has been icing her foot but has not started the gel yet.   Pt would like the [place] location for referral.

## 2024-03-01 NOTE — Telephone Encounter (Signed)
 Copied from CRM [id number]. Topic: Clinical - Medical Advice >> Mar 01, 2024 11:35 AM Kathy Gamble wrote: Reason for CRM: Patient has requested for the provider to prescribe a pain medication; patient would like to also inform the provider the compression socks are not helping the swelling going down and she has no energy; patient has requested a follow up call #[phone]

## 2024-03-01 NOTE — Addendum Note (Signed)
 Addended by: Eden Emms on: 03/01/2024 07:47 AM   Modules accepted: Orders

## 2024-03-04 ENCOUNTER — Other Ambulatory Visit: Payer: Self-pay | Admitting: Nurse Practitioner

## 2024-03-04 DIAGNOSIS — J302 Other seasonal allergic rhinitis: Secondary | ICD-10-CM

## 2024-03-06 ENCOUNTER — Ambulatory Visit: Payer: Medicare Other | Admitting: Nurse Practitioner

## 2024-03-07 ENCOUNTER — Encounter: Payer: Self-pay | Admitting: Nurse Practitioner

## 2024-03-10 ENCOUNTER — Ambulatory Visit: Admitting: Family Medicine

## 2024-03-10 VITALS — BP 118/80 | HR 93 | Temp 98.0°F | Ht 69.0 in | Wt 289.4 lb

## 2024-03-10 DIAGNOSIS — J069 Acute upper respiratory infection, unspecified: Secondary | ICD-10-CM | POA: Diagnosis not present

## 2024-03-10 DIAGNOSIS — G43E09 Chronic migraine with aura, not intractable, without status migrainosus: Secondary | ICD-10-CM | POA: Diagnosis not present

## 2024-03-10 NOTE — Progress Notes (Signed)
 Patient ID: Kathy Gamble, female    DOB: May 31, 1976, 48 y.o.   MRN: [id number]  This visit was conducted in person.  BP 118/80 (BP Location: Left Arm, Patient Position: Sitting, Cuff Size: Large)   Pulse 93   Temp 98 F (36.7 C) (Temporal)   Ht 5\' 9"  (1.753 m)   Wt 289 lb 6 oz (131.3 kg)   LMP  (LMP Unknown)   SpO2 99%   BMI 42.73 kg/m    CC:  Chief Complaint  Patient presents with   Cough    Started yesterday   Hoarse    Subjective:   HPI: Kathy Gamble is a 48 y.o. female presenting on 03/10/2024 for Cough (Started yesterday) and Hoarse   Date of onset:  24 hours Initial symptoms included  Nasal congestion, post nasal drip...  cough out yellow mucus Symptoms progressed to Hoarse voice  Mild ST.  B ear pain, no sinus pain  No fever   No SOb, no wheeze   Has migraine headaches.. sees headache doctor... has upcomoing Appt at [facility] in April. Reviewed last neurology note, Dr. Malvin Johns   Sick contacts:  family members COVID testing:   none    She has tried to treat with  cough drops, goody's powder, tylenol, ibuprofen, mucinex    No history of chronic lung disease such as asthma or COPD. Non-smoker.       Relevant past medical, surgical, family and social history reviewed and updated as indicated. Interim medical history since our last visit reviewed. Allergies and medications reviewed and updated. Outpatient Medications Prior to Visit  Medication Sig Dispense Refill   albuterol (VENTOLIN HFA) 108 (90 Base) MCG/ACT inhaler INHALE 1-2 PUFFS INTO THE LUNGS EVERY 6 HOURS AS NEEDED FOR WHEEZING OR SHORTNESS OF BREATH 8.5 g 0   gabapentin (NEURONTIN) 400 MG capsule Take 1 capsule (400 mg total) by mouth at bedtime. 1 caspule in AM and 2 capsules in PM 90 capsule 1   hydrOXYzine (ATARAX) 50 MG tablet daily.     levocetirizine (XYZAL) 5 MG tablet Take 1 tablet (5 mg total) by mouth every evening. 90 tablet 2   losartan (COZAAR) 50 MG tablet Take 1 tablet (50 mg total)  by mouth daily. 90 tablet 0   montelukast (SINGULAIR) 10 MG tablet TAKE ONE TABLET BY MOUTH ONCE DAILY FOR sinuses 90 tablet 3   Multiple Vitamin (MULTIVITAMIN) capsule Take 1 capsule by mouth daily.     omeprazole (PRILOSEC) 20 MG capsule TAKE 1 CAPSULE BY MOUTH EVERY DAY 90 capsule 0   QUEtiapine (SEROQUEL) 25 MG tablet Take 25 mg at night for a week then increase to 50 mg at night and continue that dose     tiZANidine (ZANAFLEX) 4 MG tablet Take 1 tablet (4 mg total) by mouth at bedtime as needed for muscle spasms. 14 tablet 0   verapamil (CALAN) 40 MG tablet Take 1 tablet by mouth 2 (two) times daily.     zonisamide (ZONEGRAN) 100 MG capsule Take 100 mg by mouth at bedtime.     Erenumab-aooe (AIMOVIG) 140 MG/ML SOAJ Inject into the skin. (Patient not taking: Reported on 02/22/2024)     venlafaxine (EFFEXOR) 75 MG tablet Take 75 mg by mouth at bedtime. (Patient not taking: Reported on 02/22/2024)     No facility-administered medications prior to visit.     Per HPI unless specifically indicated in ROS section below Review of Systems  Constitutional:  Negative for fatigue and  fever.  HENT:  Positive for congestion. Negative for ear pain.   Eyes:  Negative for pain.  Respiratory:  Positive for cough. Negative for chest tightness and shortness of breath.   Cardiovascular:  Negative for chest pain, palpitations and leg swelling.  Gastrointestinal:  Negative for abdominal pain.  Genitourinary:  Negative for dysuria and vaginal bleeding.  Musculoskeletal:  Negative for back pain.  Neurological:  Positive for headaches. Negative for syncope and light-headedness.  Psychiatric/Behavioral:  Negative for dysphoric mood.    Objective:  BP 118/80 (BP Location: Left Arm, Patient Position: Sitting, Cuff Size: Large)   Pulse 93   Temp 98 F (36.7 C) (Temporal)   Ht 5\' 9"  (1.753 m)   Wt 289 lb 6 oz (131.3 kg)   LMP  (LMP Unknown)   SpO2 99%   BMI 42.73 kg/m   Wt Readings from Last 3 Encounters:   03/10/24 289 lb 6 oz (131.3 kg)  02/22/24 288 lb (130.6 kg)  02/17/24 286 lb 2.5 oz (129.8 kg)      Physical Exam Constitutional:      General: She is not in acute distress.    Appearance: She is well-developed. She is not ill-appearing or toxic-appearing.  HENT:     Head: Normocephalic.     Right Ear: Hearing, tympanic membrane, ear canal and external ear normal. Tympanic membrane is not erythematous, retracted or bulging.     Left Ear: Hearing, tympanic membrane, ear canal and external ear normal. Tympanic membrane is not erythematous, retracted or bulging.     Nose: Mucosal edema and rhinorrhea present.     Right Sinus: No maxillary sinus tenderness or frontal sinus tenderness.     Left Sinus: No maxillary sinus tenderness or frontal sinus tenderness.     Mouth/Throat:     Pharynx: Uvula midline.  Eyes:     General: Lids are normal. Lids are everted, no foreign bodies appreciated.     Conjunctiva/sclera: Conjunctivae normal.     Pupils: Pupils are equal, round, and reactive to light.  Neck:     Thyroid: No thyroid mass or thyromegaly.     Vascular: No carotid bruit.     Trachea: Trachea normal.  Cardiovascular:     Rate and Rhythm: Normal rate and regular rhythm.     Pulses: Normal pulses.     Heart sounds: Normal heart sounds, S1 normal and S2 normal. No murmur heard.    No friction rub. No gallop.  Pulmonary:     Effort: Pulmonary effort is normal. No tachypnea or respiratory distress.     Breath sounds: Normal breath sounds. No decreased breath sounds, wheezing, rhonchi or rales.  Musculoskeletal:     Cervical back: Normal range of motion and neck supple.  Skin:    General: Skin is warm and dry.     Findings: No rash.  Neurological:     Mental Status: She is alert.  Psychiatric:        Mood and Affect: Mood is not anxious or depressed.        Speech: Speech normal.        Behavior: Behavior normal. Behavior is cooperative.        Judgment: Judgment normal.        Results for orders placed or performed during the hospital encounter of 02/17/24  CBC with Differential   Collection Time: 02/17/24 11:00 AM  Result Value Ref Range   WBC 8.1 4.0 - 10.5 K/uL   RBC 4.35 3.87 -  5.11 MIL/uL   Hemoglobin 12.8 12.0 - 15.0 g/dL   HCT 16.1 09.6 - 04.5 %   MCV 86.9 80.0 - 100.0 fL   MCH 29.4 26.0 - 34.0 pg   MCHC 33.9 30.0 - 36.0 g/dL   RDW 40.9 81.1 - 91.4 %   Platelets 273 150 - 400 K/uL   nRBC 0.0 0.0 - 0.2 %   Neutrophils Relative % 67 %   Neutro Abs 5.5 1.7 - 7.7 K/uL   Lymphocytes Relative 24 %   Lymphs Abs 2.0 0.7 - 4.0 K/uL   Monocytes Relative 6 %   Monocytes Absolute 0.5 0.1 - 1.0 K/uL   Eosinophils Relative 1 %   Eosinophils Absolute 0.1 0.0 - 0.5 K/uL   Basophils Relative 1 %   Basophils Absolute 0.1 0.0 - 0.1 K/uL   Immature Granulocytes 1 %   Abs Immature Granulocytes 0.04 0.00 - 0.07 K/uL  Basic metabolic panel   Collection Time: 02/17/24 11:00 AM  Result Value Ref Range   Sodium 139 135 - 145 mmol/L   Potassium 3.5 3.5 - 5.1 mmol/L   Chloride 108 98 - 111 mmol/L   CO2 23 22 - 32 mmol/L   Glucose, Bld 102 (H) 70 - 99 mg/dL   BUN 21 (H) 6 - 20 mg/dL   Creatinine, Ser 7.82 0.44 - 1.00 mg/dL   Calcium 8.6 (L) 8.9 - 10.3 mg/dL   GFR, Estimated >95 >62 mL/min   Anion gap 8 5 - 15  Brain natriuretic peptide   Collection Time: 02/17/24 11:00 AM  Result Value Ref Range   B Natriuretic Peptide 29.9 0.0 - 100.0 pg/mL    Assessment and Plan  There are no diagnoses linked to this encounter.  No follow-ups on file.   Kerby Nora, MD

## 2024-03-10 NOTE — Assessment & Plan Note (Signed)
 Encouraged patient to limit over-the-counter medications such as Goody's, Tylenol and ibuprofen.  She has not yet started Aimovig and had questions about this.  She has an appointment within the next week at her headache specialist at [facility].  I have asked her to discuss the Aimovig with him.

## 2024-03-10 NOTE — Assessment & Plan Note (Signed)
 Acute, no sign of acute bacterial infection.  Most likely viral upper respiratory tract infection.  No fever or specific concern for COVID or flu. Recommend Mucinex DM twice daily, Flonase 2 sprays per nostril daily, rest and hydration.  Return and ER precautions provided.

## 2024-03-16 DIAGNOSIS — G479 Sleep disorder, unspecified: Secondary | ICD-10-CM | POA: Diagnosis not present

## 2024-03-16 DIAGNOSIS — R200 Anesthesia of skin: Secondary | ICD-10-CM | POA: Diagnosis not present

## 2024-03-16 DIAGNOSIS — R519 Headache, unspecified: Secondary | ICD-10-CM | POA: Diagnosis not present

## 2024-03-16 DIAGNOSIS — G35 Multiple sclerosis: Secondary | ICD-10-CM | POA: Diagnosis not present

## 2024-03-16 DIAGNOSIS — R42 Dizziness and giddiness: Secondary | ICD-10-CM | POA: Diagnosis not present

## 2024-03-16 DIAGNOSIS — R937 Abnormal findings on diagnostic imaging of other parts of musculoskeletal system: Secondary | ICD-10-CM | POA: Diagnosis not present

## 2024-03-16 DIAGNOSIS — R202 Paresthesia of skin: Secondary | ICD-10-CM | POA: Diagnosis not present

## 2024-03-16 DIAGNOSIS — H9203 Otalgia, bilateral: Secondary | ICD-10-CM | POA: Diagnosis not present

## 2024-03-16 DIAGNOSIS — M542 Cervicalgia: Secondary | ICD-10-CM | POA: Diagnosis not present

## 2024-03-20 ENCOUNTER — Ambulatory Visit (INDEPENDENT_AMBULATORY_CARE_PROVIDER_SITE_OTHER): Admitting: Nurse Practitioner

## 2024-03-20 ENCOUNTER — Encounter (INDEPENDENT_AMBULATORY_CARE_PROVIDER_SITE_OTHER): Payer: Self-pay | Admitting: Nurse Practitioner

## 2024-03-20 VITALS — BP 128/91 | HR 82 | Resp 16 | Wt 290.4 lb

## 2024-03-20 DIAGNOSIS — M25462 Effusion, left knee: Secondary | ICD-10-CM | POA: Diagnosis not present

## 2024-03-20 DIAGNOSIS — M79672 Pain in left foot: Secondary | ICD-10-CM

## 2024-03-20 DIAGNOSIS — M25572 Pain in left ankle and joints of left foot: Secondary | ICD-10-CM

## 2024-03-20 DIAGNOSIS — M79671 Pain in right foot: Secondary | ICD-10-CM | POA: Diagnosis not present

## 2024-03-20 DIAGNOSIS — M25562 Pain in left knee: Secondary | ICD-10-CM

## 2024-03-20 DIAGNOSIS — I8390 Asymptomatic varicose veins of unspecified lower extremity: Secondary | ICD-10-CM

## 2024-03-20 NOTE — Progress Notes (Signed)
 Subjective:    Patient ID: Kathy Gamble, female    DOB: [birth year], 48 y.o.   MRN: [id number] Chief Complaint  Patient presents with   New Patient (Initial Visit)    Ref Toney Reil consult le edema    Kathy Gamble is a 48 year old female who presents today as a referral from [facility] cable, NP in regards to ankle swelling.  The patient notes that the swelling began about a month or so ago.  She also had pain associated with it was felt that it was a sprain.  She was advised to utilize ice as well as compression socks.  She was also on amlodipine which has been stopped.  Despite this she still continues to have some discomfort in her ankle as well as some swelling.  She denies swelling throughout the leg but she also notes that she has a swollen left knee.  She previously wore a brace on her left knee and recently stopped wearing it.  The swelling came after she stopped wearing her left knee brace.  She also endorses having some lower back pain and feels that her back goes out at times.  She also recently noted swelling in the right sole of her foot.  She has that when she stands on it it causes pressure and pain and discomfort.  She has noted to have a bit of a shuffling gait today.  Currently there are no open wounds or ulcerations.  She has small spider varicosities bilaterally.  She denies claudication-like symptoms.    Review of Systems  Musculoskeletal:  Positive for arthralgias and joint swelling.  All other systems reviewed and are negative.      Objective:   Physical Exam Vitals reviewed.  HENT:     Head: Normocephalic.  Cardiovascular:     Rate and Rhythm: Normal rate.     Pulses: Normal pulses.  Pulmonary:     Effort: Pulmonary effort is normal.  Skin:    General: Skin is warm and dry.  Neurological:     Mental Status: She is alert and oriented to person, place, and time.     Gait: Gait abnormal.  Psychiatric:        Mood and Affect: Mood normal.        Behavior: Behavior  normal.        Thought Content: Thought content normal.        Judgment: Judgment normal.     BP (!) 128/91   Pulse 82   Resp 16   Wt 290 lb 6.4 oz (131.7 kg)   LMP  (LMP Unknown)   BMI 42.88 kg/m   Past Medical History:  Diagnosis Date   Allergy    Bee sting allergy 03/02/2015   Chicken pox    Frequent headaches    GERD (gastroesophageal reflux disease)     Social History   Socioeconomic History   Marital status: Married    Spouse name: Not on file   Number of children: Not on file   Years of education: Not on file   Highest education level: Not on file  Occupational History   Occupation: disabled  Tobacco Use   Smoking status: Never   Smokeless tobacco: Never  Vaping Use   Vaping status: Never Used  Substance and Sexual Activity   Alcohol use: No    Alcohol/week: 0.0 standard drinks of alcohol   Drug use: Not Currently    Types: Cocaine   Sexual activity: Yes    Birth  control/protection: None  Other Topics Concern   Not on file  Social History Narrative   Cognitive dysfunction - lives with her husband   Social Drivers of Corporate investment banker Strain: Low Risk  (04/21/2023)   Overall Financial Resource Strain (CARDIA)    Difficulty of Paying Living Expenses: Not hard at all  Food Insecurity: No Food Insecurity (04/21/2023)   Hunger Vital Sign    Worried About Running Out of Food in the Last Year: Never true    Ran Out of Food in the Last Year: Never true  Transportation Needs: No Transportation Needs (04/21/2023)   PRAPARE - Administrator, Civil Service (Medical): No    Lack of Transportation (Non-Medical): No  Physical Activity: Insufficiently Active (04/21/2023)   Exercise Vital Sign    Days of Exercise per Week: 2 days    Minutes of Exercise per Session: 20 min  Stress: No Stress Concern Present (04/21/2023)   [employer] of Occupational Health - Occupational Stress Questionnaire    Feeling of Stress : Not at all  Social  Connections: Moderately Integrated (04/21/2023)   Social Connection and Isolation Panel [NHANES]    Frequency of Communication with Friends and Family: More than three times a week    Frequency of Social Gatherings with Friends and Family: More than three times a week    Attends Religious Services: More than 4 times per year    Active Member of Clubs or Organizations: No    Attends Banker Meetings: Never    Marital Status: Married  Catering manager Violence: Not At Risk (04/21/2023)   Humiliation, Afraid, Rape, and Kick questionnaire    Fear of Current or Ex-Partner: No    Emotionally Abused: No    Physically Abused: No    Sexually Abused: No    Past Surgical History:  Procedure Laterality Date   ADENOIDECTOMY     TONSILLECTOMY      Family History  Problem Relation Age of Onset   Stroke Mother    Mental retardation Mother    Cancer Maternal Grandmother    Cancer Paternal Grandmother        Breast   Heart disease Paternal Grandfather     Allergies  Allergen Reactions   Bee Venom Itching   Nortriptyline Other (See Comments)    GI distress   Latex Rash    The powder in the gloves causes rash       Latest Ref Rng & Units 02/17/2024   11:00 AM 01/20/2024   11:37 AM 11/16/2022    9:14 AM  CBC  WBC 4.0 - 10.5 K/uL 8.1  7.5  10.2   Hemoglobin 12.0 - 15.0 g/dL 16.1  09.6  04.5   Hematocrit 36.0 - 46.0 % 37.8  40.6  39.9   Platelets 150 - 400 K/uL 273  296.0  351.0       CMP     Component Value Date/Time   NA 139 02/17/2024 1100   K 3.5 02/17/2024 1100   CL 108 02/17/2024 1100   CO2 23 02/17/2024 1100   GLUCOSE 102 (H) 02/17/2024 1100   BUN 21 (H) 02/17/2024 1100   CREATININE 0.66 02/17/2024 1100   CREATININE 0.75 06/19/2022 1554   CALCIUM 8.6 (L) 02/17/2024 1100   PROT 6.3 01/20/2024 1137   ALBUMIN 3.8 01/20/2024 1137   AST 12 01/20/2024 1137   ALT 11 01/20/2024 1137   ALKPHOS 141 (H) 01/20/2024 1137   BILITOT 0.4  01/20/2024 1137   GFR 104.63  01/20/2024 1137   GFRNONAA >60 02/17/2024 1100     No results found.     Assessment & Plan:   1. Bilateral foot pain (Primary) The patient has some swelling on the bottom of her right foot and she notes that it is painful and uncomfortable to walk on.  She also notes that there is some tenderness as well.  Based on this I have a suspicion this could be related to plantar fasciitis however given the abnormal location for this to be considered vascular swelling, but the patient referred to podiatry for evaluation. 2. Acute left ankle pain Initially it was felt that the patient had a sprain and this has not been completely ruled out.  If the patient indeed had a sprain of her ankle this can have some lingering issues with swelling.  As noted above, we will have the patient referred to podiatry for further evaluation.  3. Varicose veins of ankle The patient does have some notable small spider varicosities bilaterally.  It is possible that some of her swelling is contributed to by venous insufficiency.  Will have the patient return at her convenience for bilateral venous reflux studies.  She is advised to continue with use of medical grade compression stockings.  She should also continue to elevate her lower extremity and continue with activity as tolerable.  4. Pain and swelling of knee, left The patient notes that her pain and discomfort in her left knee began after she stopped wearing her previous knee brace.  It is recommended that she continue with that she use as the cause for the pain may have been not using it.  Current Outpatient Medications on File Prior to Visit  Medication Sig Dispense Refill   albuterol (VENTOLIN HFA) 108 (90 Base) MCG/ACT inhaler INHALE 1-2 PUFFS INTO THE LUNGS EVERY 6 HOURS AS NEEDED FOR WHEEZING OR SHORTNESS OF BREATH 8.5 g 0   gabapentin (NEURONTIN) 400 MG capsule Take 1 capsule (400 mg total) by mouth at bedtime. 1 caspule in AM and 2 capsules in PM 90 capsule 1    hydrOXYzine (ATARAX) 50 MG tablet daily.     levocetirizine (XYZAL) 5 MG tablet Take 1 tablet (5 mg total) by mouth every evening. 90 tablet 2   losartan (COZAAR) 50 MG tablet Take 1 tablet (50 mg total) by mouth daily. 90 tablet 0   montelukast (SINGULAIR) 10 MG tablet TAKE ONE TABLET BY MOUTH ONCE DAILY FOR sinuses 90 tablet 3   Multiple Vitamin (MULTIVITAMIN) capsule Take 1 capsule by mouth daily.     omeprazole (PRILOSEC) 20 MG capsule TAKE 1 CAPSULE BY MOUTH EVERY DAY 90 capsule 0   QUEtiapine (SEROQUEL) 25 MG tablet Take 25 mg at night for a week then increase to 50 mg at night and continue that dose     tiZANidine (ZANAFLEX) 4 MG tablet Take 1 tablet (4 mg total) by mouth at bedtime as needed for muscle spasms. 14 tablet 0   zonisamide (ZONEGRAN) 100 MG capsule Take 100 mg by mouth at bedtime.     verapamil (CALAN) 40 MG tablet Take 1 tablet by mouth 2 (two) times daily. (Patient not taking: Reported on 03/20/2024)     No current facility-administered medications on file prior to visit.    There are no Patient Instructions on file for this visit. No follow-ups on file.   Georgiana Spinner, NP

## 2024-03-22 ENCOUNTER — Telehealth: Payer: Self-pay | Admitting: Nurse Practitioner

## 2024-03-22 ENCOUNTER — Ambulatory Visit (INDEPENDENT_AMBULATORY_CARE_PROVIDER_SITE_OTHER): Admitting: Nurse Practitioner

## 2024-03-22 VITALS — BP 130/98 | HR 93 | Temp 98.4°F | Ht 69.0 in | Wt 287.4 lb

## 2024-03-22 DIAGNOSIS — R600 Localized edema: Secondary | ICD-10-CM | POA: Diagnosis not present

## 2024-03-22 DIAGNOSIS — I10 Essential (primary) hypertension: Secondary | ICD-10-CM | POA: Diagnosis not present

## 2024-03-22 NOTE — Telephone Encounter (Signed)
 Does this need to be addressed further after the message on epic?

## 2024-03-22 NOTE — Progress Notes (Signed)
 Established Patient Office Visit  Subjective   Patient ID: Kathy Gamble, female    DOB: Apr 30, 1976  Age: 48 y.o. MRN: [id number]  Chief Complaint  Patient presents with   4 week BP follow up    Pt does not check BP regularly.     HPI  HTN/Edema: patient has a history of lower extremity edema that waxes and wans. She has been followed by vascular in the past. She was on amlodipine 10mg  and was reduced to 5mg . At the last office visit she was taken off amlodipine altogether and her losartan was increased to 50mg  daily and she is here for a follow up  States that she has not been taking any of the blood pressure medicine. She does not have a blood cuff at home. She does not think the swelling has changed much. It might have went down.  Patient also complains about soreness to the bilateral foot that is worse with walking.  She did describe this to vascular the other day.  They did refer podiatry.  Patient was curious and thought it was her sciatic nerve that could be causing the discomfort.  Viral URI with cough: Patient was evaluated by colleague and patient states "she did not calling again".  Did explain to patient this was thought to be viral in nature and did not require any antibiotics at that juncture.  Patient states she does have some of the cough left.  Did explain it can take up to 6 weeks for cough to resolve.   Review of Systems  Constitutional:  Negative for chills and fever.  Respiratory:  Negative for shortness of breath.   Cardiovascular:  Negative for chest pain.  Neurological:  Positive for headaches.      Objective:     BP (!) 130/98   Pulse 93   Temp 98.4 F (36.9 C) (Oral)   Ht 5\' 9"  (1.753 m)   Wt 287 lb 6.4 oz (130.4 kg)   LMP  (LMP Unknown)   SpO2 95%   BMI 42.44 kg/m    Physical Exam Vitals and nursing note reviewed.  Constitutional:      Appearance: Normal appearance.  Cardiovascular:     Rate and Rhythm: Normal rate and regular rhythm.      Heart sounds: Normal heart sounds.  Pulmonary:     Effort: Pulmonary effort is normal.     Breath sounds: Normal breath sounds.  Neurological:     Mental Status: She is alert.      No results found for any visits on 03/22/24.    The 10-year ASCVD risk score (Arnett DK, et al., 2019) is: 1.1%    Assessment & Plan:   Problem List Items Addressed This Visit       Cardiovascular and Mediastinum   Primary hypertension - Primary   Patient was post to be taking losartan 50 mg daily but has not been.  Patient should have medication at home highlighted medication on AVS and wrote it in the comment section to take medication daily.  Follow-up 6 weeks for blood pressure recheck        Other   Bilateral lower extremity edema   History of the same modest improvement with elimination of amlodipine altogether.  Patient is being followed by vascular they have set up for ultrasounds to check for venous insufficiency.  Continue following specialist as recommended       Return in about 6 weeks (around 05/03/2024) for BP recheck.  Audria Nine, NP

## 2024-03-22 NOTE — Assessment & Plan Note (Signed)
 History of the same modest improvement with elimination of amlodipine altogether.  Patient is being followed by vascular they have set up for ultrasounds to check for venous insufficiency.  Continue following specialist as recommended

## 2024-03-22 NOTE — Patient Instructions (Signed)
 Nice to see you today I want you to be taking Losartan 50mg  daily for your blood pressure I want to see you in 6 weeks for a blood pressure recheck Sooner if you need me

## 2024-03-22 NOTE — Telephone Encounter (Addendum)
 No further action needed. Cough is not newly acute.  Looks like Dr. Ermalene Searing recommended mucinex DM and flonase spray for the acute cough without bacterial infection.

## 2024-03-22 NOTE — Assessment & Plan Note (Signed)
 Patient was post to be taking losartan 50 mg daily but has not been.  Patient should have medication at home highlighted medication on AVS and wrote it in the comment section to take medication daily.  Follow-up 6 weeks for blood pressure recheck

## 2024-03-22 NOTE — Telephone Encounter (Signed)
 Patient states her spouse was giving a steroid for his cough and she was not.  Did not understand why nothing was called in to pharmacy.  Advised we would send a message to provider and they would return the call/

## 2024-03-27 ENCOUNTER — Other Ambulatory Visit: Payer: Self-pay | Admitting: Nurse Practitioner

## 2024-03-27 DIAGNOSIS — J302 Other seasonal allergic rhinitis: Secondary | ICD-10-CM

## 2024-03-27 DIAGNOSIS — Z1211 Encounter for screening for malignant neoplasm of colon: Secondary | ICD-10-CM | POA: Diagnosis not present

## 2024-04-03 ENCOUNTER — Telehealth: Payer: Self-pay | Admitting: Nurse Practitioner

## 2024-04-03 LAB — COLOGUARD: COLOGUARD: NEGATIVE

## 2024-04-03 NOTE — Telephone Encounter (Signed)
 Copied from CRM [id number]. Topic: Referral - Status >> Apr 03, 2024  2:03 PM Albertha Alosa wrote: Reason for CRM: Patient called in regarding podiatry referral , patient stated she would prefer the referral to be sent to  [facility]

## 2024-04-03 NOTE — Telephone Encounter (Signed)
 I did not place the referral. Not sure if the other has been completed. Can it be changed or do I need to place a new referral?

## 2024-04-05 NOTE — Telephone Encounter (Signed)
 Do you mind changing your referral to [facility] podiatry per patient request?

## 2024-04-05 NOTE — Telephone Encounter (Signed)
 The referral is to Dr. Larey Plenty who is a podiatrist at [facility], the address listed in the referral is [facility]

## 2024-04-05 NOTE — Addendum Note (Signed)
 Addended by: Valene Gash on: 04/05/2024 04:00 PM   Modules accepted: Orders

## 2024-04-13 ENCOUNTER — Telehealth: Payer: Self-pay | Admitting: Nurse Practitioner

## 2024-04-13 NOTE — Telephone Encounter (Signed)
 Returned pt call regarding referral.  Advised to patient that referral was approved and the provider for Podiatry is [facility].  Advised pt to wait on a call from their office. Pt verbalized understanding and other than her podiatry concern pt has no other concerns at this time.

## 2024-04-13 NOTE — Telephone Encounter (Signed)
 Copied from CRM [id number]. Topic: Referral - Status >> Apr 12, 2024  5:01 PM Dyann Glaser G wrote: Reason for CRM: PT CALLED TO CHECK THE STATUS OF HER REFERRAL

## 2024-04-14 ENCOUNTER — Telehealth: Payer: Self-pay | Admitting: Nurse Practitioner

## 2024-04-14 ENCOUNTER — Ambulatory Visit: Payer: Self-pay

## 2024-04-14 ENCOUNTER — Ambulatory Visit
Admission: RE | Admit: 2024-04-14 | Discharge: 2024-04-14 | Disposition: A | Discharge status: Home / Self Care | Source: Ambulatory Visit

## 2024-04-14 VITALS — BP 118/75 | HR 86 | Temp 97.9°F | Resp 19

## 2024-04-14 DIAGNOSIS — J019 Acute sinusitis, unspecified: Secondary | ICD-10-CM | POA: Diagnosis not present

## 2024-04-14 DIAGNOSIS — M26643 Arthritis of bilateral temporomandibular joint: Secondary | ICD-10-CM | POA: Diagnosis not present

## 2024-04-14 DIAGNOSIS — M26623 Arthralgia of bilateral temporomandibular joint: Secondary | ICD-10-CM | POA: Diagnosis not present

## 2024-04-14 DIAGNOSIS — S40022A Contusion of left upper arm, initial encounter: Secondary | ICD-10-CM | POA: Diagnosis not present

## 2024-04-14 DIAGNOSIS — J301 Allergic rhinitis due to pollen: Secondary | ICD-10-CM | POA: Diagnosis not present

## 2024-04-14 NOTE — Telephone Encounter (Signed)
 I spoke with pt and pts husband. Pt slipped in shower and pt said she did not fall but pt slipped and hit arm on shower wall. Pt has bruise on upper arm and is getting better but still hurts with dull pain at 5 pain level the patient said hurts worse when moves arm. Pt said also lt jaw is swollen for 2 days ; no injury pt said she grinds her teeth and that make jaw hurt worse. No CP or SOB.Pt saw ENT today about H/A and mucus in head.and pt was given med by ENT.no available appts at [facility] today and pt wants to be seen today. [facility] office had appt but pt declined and scheduled appt at [facility] 04/14/24 at 3:15. Sending note to Margarie Shay NP and [place] pool.

## 2024-04-14 NOTE — Telephone Encounter (Signed)
 noted

## 2024-04-14 NOTE — ED Triage Notes (Addendum)
 Patient to Urgent Care with complaints of left jaw swelling/ left sided ear pain.   Symptoms started two- three weeks ago. Reports that she grinds her teeth on the same side. States she was evaluated by ENT. He recommended prednisone  but would prefer to take something different d/t undesirable side effects (poor sleep/ headaches/ irritability).  Reports she also bumped her left upper arm in the shower on Saturday. Would like this evaluated d/t bruising.

## 2024-04-14 NOTE — ED Provider Notes (Signed)
 Kathy Gamble    CSN: [id number] Arrival date & time: 04/14/24  1515      History   Chief Complaint Chief Complaint  Patient presents with   Arm Injury    Entered by patient    HPI Kathy Gamble is a 48 y.o. female.   Patient has multiple complaints.  Patient reports that she hit her left arm and has a bruise.  Patient concerned that she may need an x-ray.  Patient reports she did not have any impact of the area.  Patient also complains of jaw pain.  Patient reports she grinds her teeth and she wears an orthotic.  Patient reports that she saw ENT today for evaluation and was offered prednisone  but she does not want to take prednisone  because this causes her to have anxiety and mood swings.  Patient was advised to follow-up with her dentist.  Patient also reports that she has had some swelling in her lower extremities.  She states that it seems better today.  The history is provided by the patient. No language interpreter was used.  Arm Injury   Past Medical History:  Diagnosis Date   Allergy    Bee sting allergy 03/02/2015   Chicken pox    Frequent headaches    GERD (gastroesophageal reflux disease)     Patient Active Problem List   Diagnosis Date Noted   Acute left ankle pain 02/22/2024   Foot sprain, left, initial encounter 02/10/2024   Chronic venous insufficiency of lower extremity 02/10/2024   Varicose veins of ankle 02/10/2024   Bilateral lower extremity edema 01/20/2024   Hearing loss 12/22/2023   Neuropathy 11/30/2023   Bruising 10/01/2023   Left leg pain 08/20/2023   Injury of great toe of left foot 06/11/2023   Morbid obesity (HCC) 04/05/2023   Encounter for weight loss counseling 04/05/2023   Thoracic spine pain 02/03/2023   Chronic neck and back pain 02/03/2023   Mild persistent asthma 08/14/2022   Onychomycosis 08/14/2022   Great toe pain, right 08/14/2022   Abnormal CT of spine 03/31/2022   Epistaxis 03/18/2022   Primary hypertension  03/18/2022   TMJ (temporomandibular joint syndrome) 12/19/2021   Preventative health care 12/03/2021   Cognitive dysfunction 07/21/2021   Chronic migraine with aura 05/05/2021   Viral URI with cough 11/01/2018   Seasonal allergic rhinitis due to pollen 09/20/2018   Bilateral foot pain 08/26/2018   Obesity (BMI 30-39.9) 01/02/2015   Gastroesophageal reflux disease without esophagitis 12/28/2014    Past Surgical History:  Procedure Laterality Date   ADENOIDECTOMY     TONSILLECTOMY      OB History     Gravida  2   Para  2   Term  0   Preterm  0   AB  0   Living  2      SAB  0   IAB  0   Ectopic  0   Multiple  0   Live Births               Home Medications    Prior to Admission medications   Medication Sig Start Date End Date Taking? Authorizing Provider  Azelastine HCl 137 MCG/SPRAY SOLN Place into both nostrils. 04/14/24  Yes [provider]  albuterol  (VENTOLIN  HFA) 108 (90 Base) MCG/ACT inhaler INHALE 1-2 PUFFS INTO THE LUNGS EVERY 6 HOURS AS NEEDED FOR WHEEZING OR SHORTNESS OF BREATH 03/27/24   Dorothe Gaster, NP  gabapentin  (NEURONTIN ) 400 MG  capsule Take 1 capsule (400 mg total) by mouth at bedtime. 1 caspule in AM and 2 capsules in PM 02/10/24   Bedsole, Amy E, MD  hydrOXYzine (ATARAX) 50 MG tablet daily.    [provider]  levocetirizine (XYZAL ) 5 MG tablet Take 1 tablet (5 mg total) by mouth every evening. 12/16/23   Dorothe Gaster, NP  losartan  (COZAAR ) 50 MG tablet Take 1 tablet (50 mg total) by mouth daily. 02/22/24   Dorothe Gaster, NP  montelukast  (SINGULAIR ) 10 MG tablet TAKE ONE TABLET BY MOUTH ONCE DAILY FOR sinuses 08/20/23   Bedsole, Amy E, MD  Multiple Vitamin (MULTIVITAMIN) capsule Take 1 capsule by mouth daily.    [provider]  omeprazole  (PRILOSEC) 20 MG capsule TAKE 1 CAPSULE BY MOUTH EVERY DAY 01/18/24   Dorothe Gaster, NP  QUEtiapine (SEROQUEL) 25 MG tablet Take 25 mg at night for a week then increase to 50 mg  at night and continue that dose 01/14/24   [provider]  tiZANidine  (ZANAFLEX ) 4 MG tablet Take 1 tablet (4 mg total) by mouth at bedtime as needed for muscle spasms. 05/04/23   Clark, Katherine K, NP  verapamil (CALAN) 40 MG tablet Take 1 tablet by mouth 2 (two) times daily. 09/21/23 09/20/24  [provider]  zonisamide (ZONEGRAN) 100 MG capsule Take 100 mg by mouth at bedtime. 07/21/23 07/20/24  [provider]    Family History Family History  Problem Relation Age of Onset   Stroke Mother    Mental retardation Mother    Cancer Maternal Grandmother    Cancer Paternal Grandmother        Breast   Heart disease Paternal Grandfather     Social History Social History   Tobacco Use   Smoking status: Never   Smokeless tobacco: Never  Vaping Use   Vaping status: Never Used  Substance Use Topics   Alcohol use: No    Alcohol/week: 0.0 standard drinks of alcohol   Drug use: Not Currently    Types: Cocaine     Allergies   Bee venom, Nortriptyline, and Latex   Review of Systems Review of Systems  All other systems reviewed and are negative.    Physical Exam Triage Vital Signs ED Triage Vitals  Encounter Vitals Group     BP 04/14/24 1546 118/75     Systolic BP Percentile --      Diastolic BP Percentile --      Pulse Rate 04/14/24 1546 86     Resp 04/14/24 1546 19     Temp 04/14/24 1546 97.9 F (36.6 C)     Temp src --      SpO2 04/14/24 1546 95 %     Weight --      Height --      Head Circumference --      Peak Flow --      Pain Score 04/14/24 1542 9     Pain Loc --      Pain Education --      Exclude from Growth Chart --    No data found.  Updated Vital Signs BP 118/75   Pulse 86   Temp 97.9 F (36.6 C)   Resp 19   LMP  (LMP Unknown)   SpO2 95%   Visual Acuity Right Eye Distance:   Left Eye Distance:   Bilateral Distance:    Right Eye Near:   Left Eye Near:    Bilateral Near:  Physical Exam Vitals and nursing note  reviewed.  Constitutional:      Appearance: She is well-developed.  HENT:     Head: Normocephalic.     Comments: Popping both sides of jaw with openning    Right Ear: Tympanic membrane normal.     Left Ear: Tympanic membrane normal.     Nose: Nose normal.     Mouth/Throat:     Mouth: Mucous membranes are moist.     Comments: No abscess Pulmonary:     Effort: Pulmonary effort is normal.  Abdominal:     General: There is no distension.  Musculoskeletal:        General: Normal range of motion.     Cervical back: Normal range of motion.     Comments: Bruised 3cm area left upper inner arm.    Skin:    General: Skin is warm.  Neurological:     Mental Status: She is alert and oriented to person, place, and time.  Psychiatric:        Mood and Affect: Mood normal.      UC Treatments / Results  Labs (all labs ordered are listed, but only abnormal results are displayed) Labs Reviewed - No data to display  EKG   Radiology No results found.  Procedures Procedures (including critical care time)  Medications Ordered in UC Medications - No data to display  Initial Impression / Assessment and Plan / UC Course  I have reviewed the triage vital signs and the nursing notes.  Pertinent labs & imaging results that were available during my care of the patient were reviewed by me and considered in my medical decision making (see chart for details).      Final Clinical Impressions(s) / UC Diagnoses   Final diagnoses:  Contusion of left arm, initial encounter  Bilateral temporomandibular joint pain     Discharge Instructions      See your dentist for recheck.  Return if any problems.     ED Prescriptions   None   An After Visit Summary was printed and given to the patient.     PDMP not reviewed this encounter.   Sandi Crosby, PA-C 04/14/24 [phone]

## 2024-04-14 NOTE — Telephone Encounter (Signed)
 Copied from CRM [id number]. Topic: Clinical - Medical Advice >> Apr 13, 2024  2:59 PM [place] J wrote: Reason for CRM: Patient's husband calling in due to patient's phone dying mid-way through a phone call she was on with Janae in regards to a bruise she has on her arm from falling in the shower. Patient would like to know if she needs to be seen or if this is something she can let heal on it's own.  Please call back at:  [phone]

## 2024-04-14 NOTE — Telephone Encounter (Signed)
 Left voicemail for patient to call the office back.

## 2024-04-14 NOTE — Telephone Encounter (Signed)
 Chief Complaint: jaw and ear pain Symptoms: above plus headaches Frequency: 1 wk Pertinent Negatives: Patient denies difficulty swallowing, difficulty breathing, fever, swelling Disposition: [] ED /[x] Urgent Care (no appt availability in office) / [] Appointment(In office/virtual)/ []  [facility]/ [] Home Care/ [] Refused Recommended Disposition /[] [facility]/ []  Follow-up with PCP Additional Notes: Pt reports 8-9/10 upper L jaw pain with bilateral ear pain and daily headaches. Pt states symptoms have been ongoing for a week. Pt saw an ENT today who suggested prednisone  but pt states she does not want to a steroid and is looking for other recommendations. Pt states ENT told her she is grinding two of her teeth together.  Pt has tried Goody powder and ibuprofen but it hasn't helped. RN advised go to an UC today for further evaluation. Pt agreeable. RN advised pt if she has difficulty swallowing or breathing, she needs to go to the ED. Pt verbalized understanding.    Copied from CRM [id number]. Topic: Clinical - Red Word Triage >> Apr 14, 2024 11:52 AM Kathy Gamble wrote: Red Word that prompted transfer to Nurse Triage: pt having sever pain regarding jaw not able to sleep at nigh due to pain in ear as well as tooth Reason for Disposition  [1] MILD-MODERATE mouth pain AND [2] present > 3 days  Answer Assessment - Initial Assessment Questions 1. ONSET: "When did the mouth start hurting?" (Gamble.g., hours or days ago)      Jaw and ear pain for 1 wk - upper L side  2. SEVERITY: "How bad is the pain?" (Scale 1-10; mild, moderate or severe)   - MILD (1-3):  doesn't interfere with eating or normal activities   - MODERATE (4-7): interferes with eating some solids and normal activities   - SEVERE (8-10):  excruciating pain, interferes with most normal activities   - SEVERE DYSPHAGIA: can't swallow liquids, drooling     Denies difficulty breathing or swallowing. 8-9/10 3. SORES: "Are there  any sores or ulcers in the mouth?" If Yes, ask: "What part of the mouth are the sores in?"     No  4. FEVER: "Do you have a fever?" If Yes, ask: "What is your temperature, how was it measured, and when did it start?"     No  5. CAUSE: "What do you think is causing the mouth pain?"     Teeth grinding per ENT 6. OTHER SYMPTOMS: "Do you have any other symptoms?" (Gamble.g., difficulty breathing)     Pain in bilateral ears as well. Ear is throbbing on the left side.  "Since the pollen." States it's hard to hear depending on what is going on. Denies fever. Endorses chills. Endorses mucus in her throat. Taking goody powder and ibuprofen for jaw pain, not working for her.  Pt states she sleeps with a mouth guard. Pt states she went to ENT today and they told her 2 of her teeth are grinding together. They suggested prednisone , she doesn't want to take it. Looking for other recommendations. . Endorses H/A and states she has one every night.  Protocols used: Mouth Pain-A-AH

## 2024-04-14 NOTE — Discharge Instructions (Addendum)
 See your dentist for recheck.  Return if any problems.

## 2024-04-19 ENCOUNTER — Other Ambulatory Visit: Payer: Self-pay | Admitting: Family Medicine

## 2024-04-22 ENCOUNTER — Other Ambulatory Visit: Payer: Self-pay | Admitting: Nurse Practitioner

## 2024-04-24 ENCOUNTER — Ambulatory Visit: Payer: Self-pay

## 2024-04-24 ENCOUNTER — Ambulatory Visit (INDEPENDENT_AMBULATORY_CARE_PROVIDER_SITE_OTHER): Payer: Medicare PPO

## 2024-04-24 ENCOUNTER — Ambulatory Visit: Admitting: Nurse Practitioner

## 2024-04-24 VITALS — BP 118/75 | Ht 69.0 in | Wt 287.0 lb

## 2024-04-24 DIAGNOSIS — Z2821 Immunization not carried out because of patient refusal: Secondary | ICD-10-CM | POA: Diagnosis not present

## 2024-04-24 DIAGNOSIS — Z0000 Encounter for general adult medical examination without abnormal findings: Secondary | ICD-10-CM

## 2024-04-24 NOTE — Progress Notes (Signed)
 Because this visit was a virtual/telehealth visit,  certain criteria was not obtained, such a blood pressure, CBG if applicable, and timed get up and go. Any medications not marked as "taking" were not mentioned during the medication reconciliation part of the visit. Any vitals not documented were not able to be obtained due to this being a telehealth visit or patient was unable to self-report a recent blood pressure reading due to a lack of equipment at home via telehealth. Vitals that have been documented are verbally provided by the patient.   This visit was performed by a medical professional under my direct supervision. I was immediately available for consultation/collaboration. I have reviewed and agree with the Annual Wellness Visit documentation.  Subjective:   Kathy Gamble is a 47 y.o. who presents for a Medicare Wellness preventive visit.  As a reminder, Annual Wellness Visits don't include a physical exam, and some assessments may be limited, especially if this visit is performed virtually. We may recommend an in-person visit if needed.  Visit Complete: Virtual I connected with  Kathy Gamble on 04/24/24 by a audio enabled telemedicine application and verified that I am speaking with the correct person using two identifiers.  Patient Location: Home  Provider Location: Home Office  I discussed the limitations of evaluation and management by telemedicine. The patient expressed understanding and agreed to proceed.  Vital Signs: Because this visit was a virtual/telehealth visit, some criteria may be missing or patient reported. Any vitals not documented were not able to be obtained and vitals that have been documented are patient reported.  VideoDeclined- This patient declined Librarian, academic. Therefore the visit was completed with audio only.  Persons Participating in Visit: Patient.  AWV Questionnaire: No: Patient Medicare AWV questionnaire was not  completed prior to this visit.  Cardiac Risk Factors include: advanced age (>68men, >16 women);obesity (BMI >30kg/m2);hypertension     Objective:     Today's Vitals   04/24/24 0943  BP: 118/75  Weight: 287 lb (130.2 kg)  Height: 5\' 9"  (1.753 m)  PainSc: 0-No pain   Body mass index is 42.38 kg/m.     04/24/2024    9:42 AM 04/14/2024    3:42 PM 02/17/2024   10:44 AM 04/21/2023   10:15 AM 04/17/2022    9:29 AM 03/18/2022   11:20 AM 03/16/2022    6:38 PM  Advanced Directives  Does Patient Have a Medical Advance Directive? No No No No No No No  Would patient like information on creating a medical advance directive? No - Patient declined  No - Patient declined No - Patient declined No - Patient declined      Current Medications (verified) Outpatient Encounter Medications as of 04/24/2024  Medication Sig   albuterol  (VENTOLIN  HFA) 108 (90 Base) MCG/ACT inhaler INHALE 1-2 PUFFS INTO THE LUNGS EVERY 6 HOURS AS NEEDED FOR WHEEZING OR SHORTNESS OF BREATH   Azelastine HCl 137 MCG/SPRAY SOLN Place into both nostrils.   gabapentin  (NEURONTIN ) 400 MG capsule Take 1 capsule (400 mg total) by mouth at bedtime. 1 caspule in AM and 2 capsules in PM   hydrOXYzine (ATARAX) 50 MG tablet daily.   levocetirizine (XYZAL ) 5 MG tablet Take 1 tablet (5 mg total) by mouth every evening.   losartan  (COZAAR ) 50 MG tablet Take 1 tablet (50 mg total) by mouth daily.   montelukast  (SINGULAIR ) 10 MG tablet TAKE ONE TABLET BY MOUTH ONCE DAILY FOR sinuses   Multiple Vitamin (MULTIVITAMIN) capsule Take  1 capsule by mouth daily.   omeprazole  (PRILOSEC) 20 MG capsule TAKE 1 CAPSULE BY MOUTH EVERY DAY   QUEtiapine (SEROQUEL) 25 MG tablet Take 25 mg at night for a week then increase to 50 mg at night and continue that dose   tiZANidine  (ZANAFLEX ) 4 MG tablet Take 1 tablet (4 mg total) by mouth at bedtime as needed for muscle spasms.   verapamil (CALAN) 40 MG tablet Take 1 tablet by mouth 2 (two) times daily.   zonisamide  (ZONEGRAN) 100 MG capsule Take 100 mg by mouth at bedtime.   No facility-administered encounter medications on file as of 04/24/2024.    Allergies (verified) Bee venom, Nortriptyline, and Latex   History: Past Medical History:  Diagnosis Date   Allergy    Bee sting allergy 03/02/2015   Chicken pox    Frequent headaches    GERD (gastroesophageal reflux disease)    Past Surgical History:  Procedure Laterality Date   ADENOIDECTOMY     TONSILLECTOMY     Family History  Problem Relation Age of Onset   Stroke Mother    Mental retardation Mother    Cancer Maternal Grandmother    Cancer Paternal Grandmother        Breast   Heart disease Paternal Grandfather    Social History   Socioeconomic History   Marital status: Married    Spouse name: Not on file   Number of children: Not on file   Years of education: Not on file   Highest education level: Not on file  Occupational History   Occupation: disabled  Tobacco Use   Smoking status: Never   Smokeless tobacco: Never  Vaping Use   Vaping status: Never Used  Substance and Sexual Activity   Alcohol use: No    Alcohol/week: 0.0 standard drinks of alcohol   Drug use: Not Currently    Types: Cocaine   Sexual activity: Yes    Birth control/protection: None  Other Topics Concern   Not on file  Social History Narrative   Cognitive dysfunction - lives with her husband   Social Drivers of Corporate investment banker Strain: Low Risk  (04/24/2024)   Overall Financial Resource Strain (CARDIA)    Difficulty of Paying Living Expenses: Not hard at all  Food Insecurity: No Food Insecurity (04/24/2024)   Hunger Vital Sign    Worried About Running Out of Food in the Last Year: Never true    Ran Out of Food in the Last Year: Never true  Transportation Needs: No Transportation Needs (04/24/2024)   PRAPARE - Administrator, Civil Service (Medical): No    Lack of Transportation (Non-Medical): No  Physical Activity:  Insufficiently Active (04/24/2024)   Exercise Vital Sign    Days of Exercise per Week: 2 days    Minutes of Exercise per Session: 30 min  Stress: No Stress Concern Present (04/24/2024)   [employer] of Occupational Health - Occupational Stress Questionnaire    Feeling of Stress : Not at all  Social Connections: Moderately Integrated (04/24/2024)   Social Connection and Isolation Panel [NHANES]    Frequency of Communication with Friends and Family: More than three times a week    Frequency of Social Gatherings with Friends and Family: More than three times a week    Attends Religious Services: More than 4 times per year    Active Member of [employer] or Organizations: No    Attends Banker Meetings: Never  Marital Status: Married    Tobacco Counseling Counseling given: Not Answered    Clinical Intake:  Pre-visit preparation completed: Yes  Pain : No/denies pain Pain Score: 0-No pain     BMI - recorded: 42.38 Nutritional Status: BMI > 30  Obese Nutritional Risks: None Diabetes: No  Lab Results  Component Value Date   HGBA1C 5.2 01/20/2024   HGBA1C 5.4 11/16/2022   HGBA1C 4.9 12/03/2021     How often do you need to have someone help you when you read instructions, pamphlets, or other written materials from your doctor or pharmacy?: 1 - Never What is the last grade level you completed in school?: High School  Interpreter Needed?: No  Information entered by :: Juliann Ochoa   Activities of Daily Living     04/24/2024    9:46 AM  In your present state of health, do you have any difficulty performing the following activities:  Hearing? 0  Vision? 0  Difficulty concentrating or making decisions? 0  Walking or climbing stairs? 0  Dressing or bathing? 0  Doing errands, shopping? 0  Preparing Food and eating ? N  Using the Toilet? N  In the past six months, have you accidently leaked urine? N  Do you have problems with loss of bowel control? N   Managing your Medications? N  Managing your Finances? N  Housekeeping or managing your Housekeeping? N    Patient Care Team: Dorothe Gaster, NP as PCP - General Horace Lye, OD (Optometry)  Indicate any recent Medical Services you may have received from other than [facility] providers in the past year (date may be approximate).     Assessment:    This is a routine wellness examination for Kathy Gamble.  Hearing/Vision screen Hearing Screening - Comments:: No hearing difficulties Vision Screening - Comments:: Patient wears glasses   Goals Addressed             This Visit's Progress    Patient Stated   On track    Stay healthy and happy        Depression Screen     04/24/2024    9:47 AM 10/01/2023   11:50 AM 06/16/2023   11:42 AM 04/21/2023   10:14 AM 02/03/2023   10:08 AM 11/16/2022    8:36 AM 10/09/2022    3:22 PM  PHQ 2/9 Scores  PHQ - 2 Score 0 3 2 0 0 0 0  PHQ- 9 Score 1 13 7   0 4     Fall Risk     04/24/2024    9:45 AM 03/22/2024   10:17 AM 02/22/2024    8:36 AM 01/20/2024   11:02 AM 10/01/2023   11:51 AM  Fall Risk   Falls in the past year? 0 0 0 0 1  Number falls in past yr: 0 0 0 0   Injury with Fall? 0 0 0 0   Risk for fall due to : No Fall Risks No Fall Risks No Fall Risks No Fall Risks Other (Comment)  Follow up Falls evaluation completed Falls evaluation completed Falls evaluation completed Falls evaluation completed Falls evaluation completed    MEDICARE RISK AT HOME:  Medicare Risk at Home Any stairs in or around the home?: Yes If so, are there any without handrails?: No Home free of loose throw rugs in walkways, pet beds, electrical cords, etc?: Yes Adequate lighting in your home to reduce risk of falls?: Yes Life alert?: No Use of a cane, walker  or w/c?: No Grab bars in the bathroom?: Yes Shower chair or bench in shower?: Yes Elevated toilet seat or a handicapped toilet?: Yes  TIMED UP AND GO:  Was the test performed?  No  Cognitive Function:  6CIT completed        04/24/2024    9:45 AM 04/21/2023   10:16 AM 04/17/2022    9:21 AM  6CIT Screen  What Year? 0 points 0 points 4 points  What month? 0 points 0 points 3 points  What time? 0 points 0 points 0 points  Count back from 20 0 points 4 points 4 points  Months in reverse 0 points 0 points 4 points  Repeat phrase 0 points 8 points 10 points  Total Score 0 points 12 points 25 points    Immunizations Immunization History  Administered Date(s) Administered   Influenza Inj Mdck Quad Pf 11/01/2019   Influenza, Seasonal, Injecte, Preservative Fre 10/01/2023   Influenza,inj,Quad PF,6+ Mos 01/02/2015, 09/13/2016, 09/20/2018, 12/11/2020, 12/19/2021, 11/16/2022   PFIZER(Purple Top)SARS-COV-2 Vaccination 03/21/2020, 04/12/2020, 12/11/2020   Tdap 01/02/2015    Screening Tests Health Maintenance  Topic Date Due   Pneumococcal Vaccine 63-30 Years old (1 of 2 - PCV) Never done   COVID-19 Vaccine (4 - 2024-25 season) 01/19/2025 (Originally 08/15/2023)   INFLUENZA VACCINE  07/14/2024   DTaP/Tdap/Td (2 - Td or Tdap) 01/02/2025   Medicare Annual Wellness (AWV)  04/24/2025   Cervical Cancer Screening (HPV/Pap Cotest)  12/03/2026   Fecal DNA (Cologuard)  03/28/2027   Hepatitis C Screening  Completed   HIV Screening  Completed   HPV VACCINES  Aged Out   Meningococcal B Vaccine  Aged Out    Health Maintenance  Health Maintenance Due  Topic Date Due   Pneumococcal Vaccine 65-33 Years old (1 of 2 - PCV) Never done   Health Maintenance Items Addressed:patient will get pneumonia vaccine tomorrow  Additional Screening:  Vision Screening: Recommended annual ophthalmology exams for early detection of glaucoma and other disorders of the eye.  Dental Screening: Recommended annual dental exams for proper oral hygiene  Community Resource Referral / Chronic Care Management: CRR required this visit?  No   CCM required this visit?  No   Plan:    I have personally reviewed and noted  the following in the patient's chart:   Medical and social history Use of alcohol, tobacco or illicit drugs  Current medications and supplements including opioid prescriptions. Patient is not currently taking opioid prescriptions. Functional ability and status Nutritional status Physical activity Advanced directives List of other physicians Hospitalizations, surgeries, and ER visits in previous 12 months Vitals Screenings to include cognitive, depression, and falls Referrals and appointments  In addition, I have reviewed and discussed with patient certain preventive protocols, quality metrics, and best practice recommendations. A written personalized care plan for preventive services as well as general preventive health recommendations were provided to patient.   Freeda Jerry, [place]   04/24/2024   After Visit Summary: (MyChart) Due to this being a telephonic visit, the after visit summary with patients personalized plan was offered to patient via MyChart   Notes: Nothing significant to report at this time.

## 2024-04-24 NOTE — Telephone Encounter (Signed)
 Copied from CRM [id number]. Topic: Clinical - Red Word Triage >> Apr 24, 2024  8:08 AM Rosaria Common wrote: Red Word that prompted transfer to Nurse Triage: Nose bleed, and headaches.    Chief Complaint: Headache behind eyes, states history of migraines. Symptoms: Above Frequency: weekend Pertinent Negatives: Patient denies  Disposition: [] ED /[] Urgent Care (no appt availability in office) / [x] Appointment(In office/virtual)/ []  [facility]/ [] Home Care/ [] Refused Recommended Disposition /[] [facility]/ []  Follow-up with PCP Additional Notes: Agrees with appointment.  Reason for Disposition  [1] MODERATE headache (e.g., interferes with normal activities) AND [2] present > 24 hours AND [3] unexplained  (Exceptions: analgesics not tried, typical migraine, or headache part of viral illness)  Answer Assessment - Initial Assessment Questions 1. LOCATION: "Where does it hurt?"      eyes 2. ONSET: "When did the headache start?" (Minutes, hours or days)      Weekend 3. PATTERN: "Does the pain come and go, or has it been constant since it started?"     Comes and goes 4. SEVERITY: "How bad is the pain?" and "What does it keep you from doing?"  (e.g., Scale 1-10; mild, moderate, or severe)   - MILD (1-3): doesn't interfere with normal activities    - MODERATE (4-7): interferes with normal activities or awakens from sleep    - SEVERE (8-10): excruciating pain, unable to do any normal activities        7 5. RECURRENT SYMPTOM: "Have you ever had headaches before?" If Yes, ask: "When was the last time?" and "What happened that time?"      yes 6. CAUSE: "What do you think is causing the headache?"     unsure 7. MIGRAINE: "Have you been diagnosed with migraine headaches?" If Yes, ask: "Is this headache similar?"      yes 8. HEAD INJURY: "Has there been any recent injury to the head?"      no 9. OTHER SYMPTOMS: "Do you have any other symptoms?" (fever, stiff neck, eye pain, sore  throat, cold symptoms)     cough 10. PREGNANCY: "Is there any chance you are pregnant?" "When was your last menstrual period?"       no  Protocols used: Headache-A-AH

## 2024-04-24 NOTE — Patient Instructions (Signed)
 [name] , Thank you for taking time out of your busy schedule to complete your Annual Wellness Visit with me. I enjoyed our conversation and look forward to speaking with you again next year. I, as well as your care team,  appreciate your ongoing commitment to your health goals. Please review the following plan we discussed and let me know if I can assist you in the future. Your Game plan/ To Do List    Referrals: If you haven't heard from the office you've been referred to, please reach out to them at the phone provided.   Follow up Visits: Next Medicare AWV with our clinical staff: 04/25/2025 9:30   Have you seen your provider in the last 6 months (3 months if uncontrolled diabetes)? yes Next Office Visit with your provider: 04/25/2024  Clinician Recommendations:  Aim for 30 minutes of exercise or brisk walking, 6-8 glasses of water, and 5 servings of fruits and vegetables each day.       This is a list of the screening recommended for you and due dates:  Health Maintenance  Topic Date Due   Pneumococcal Vaccination (1 of 2 - PCV) Never done   COVID-19 Vaccine (4 - 2024-25 season) 01/19/2025*   Flu Shot  07/14/2024   DTaP/Tdap/Td vaccine (2 - Td or Tdap) 01/02/2025   Medicare Annual Wellness Visit  04/24/2025   Pap with HPV screening  12/03/2026   Cologuard (Stool DNA test)  03/28/2027   Hepatitis C Screening  Completed   HIV Screening  Completed   HPV Vaccine  Aged Out   Meningitis B Vaccine  Aged Out  *Topic was postponed. The date shown is not the original due date.    Advanced directives: (Copy Requested) Please bring a copy of your health care power of attorney and living will to the office to be added to your chart at your convenience. You can mail to [facility] [address] [place], [place] [address] or email to [email or web address] Advance Care Planning is important because it:  [x]  Makes sure you receive the medical care that is consistent  with your values, goals, and preferences  [x]  It provides guidance to your family and loved ones and reduces their decisional burden about whether or not they are making the right decisions based on your wishes.  Follow the link provided in your after visit summary or read over the paperwork we have mailed to you to help you started getting your Advance Directives in place. If you need assistance in completing these, please reach out to us  so that we can help you!  See attachments for Preventive Care and Fall Prevention Tips.

## 2024-04-24 NOTE — Telephone Encounter (Signed)
 noted

## 2024-04-25 ENCOUNTER — Ambulatory Visit: Admitting: Nurse Practitioner

## 2024-04-27 ENCOUNTER — Other Ambulatory Visit: Payer: Self-pay | Admitting: Family Medicine

## 2024-04-27 ENCOUNTER — Ambulatory Visit: Payer: Self-pay

## 2024-04-27 NOTE — Telephone Encounter (Signed)
  Chief Complaint: nose bleeds Symptoms: dryness, bleeding Frequency: over the weekend  Pertinent Negatives: Patient denies active bleeding Disposition: [] ED /[] Urgent Care (no appt availability in office) / [] Appointment(In office/virtual)/ []  [facility]/ [x] Home Care/ [] Refused Recommended Disposition /[] [facility]/ []  Follow-up with PCP Additional Notes: Triager returned pt call re: nose bleeds. Triage was limited d/t another people on the line, including the pt heard in the background. Additionally, triager had some difficulty understanding caller. Pt reported nosebleeds from nasal spray below that started over the weekend. Pt denies any active bleeding, and has stopped using spray d/t causing nasal dryness/bleeding. Pt states that she held pressure and the bleeding stopped within a few minutes. Triager reinforced home care. Patient verbalized understanding and to call back with worsening symptoms.   Copied from CRM [id number]. Topic: Clinical - Medication Question >> Apr 27, 2024 11:24 AM Magdalene School wrote: Reason for CRM: Patient called stating that Azelastine HCl 137 MCG/SPRAY SOLN is causing nose bleeds and she would like albuterol  (VENTOLIN  HFA) 108 (90 Base) MCG/ACT inhaler in replacement. She is open to other options for asthma if there are better options.  [phone] Reason for Disposition  [1] Mild-moderate nosebleed AND [2] bleeding stopped now  Answer Assessment - Initial Assessment Questions 1. AMOUNT OF BLEEDING: "How bad is the bleeding?" "How much blood was lost?" "Has the bleeding stopped?"   - MILD: needed a couple tissues   - MODERATE: needed many tissues   - SEVERE: large blood clots, soaked many tissues, lasted more than 30 minutes      "Enough to make my nose sore" 2. ONSET: "When did the nosebleed start?"      Over the weekend 3. FREQUENCY: "How many nosebleeds have you had in the last 24 hours?"      2 4. RECURRENT SYMPTOMS: "Have there been  other recent nosebleeds?" If Yes, ask: "How long did it take you to stop the bleeding?" "What worked best?"      A few minutes with pressure 5. CAUSE: "What do you think caused this nosebleed?"     Azelastine HCl 137 MCG/SPRAY SOLN  6. LOCAL FACTORS: "Do you have any cold symptoms?", "Have you been rubbing or picking at your nose?"     Endorses runny nose and cough - taking zicam  Protocols used: Nosebleed-A-AH

## 2024-04-28 NOTE — Telephone Encounter (Signed)
 Noted.

## 2024-05-01 ENCOUNTER — Other Ambulatory Visit: Payer: Self-pay | Admitting: Nurse Practitioner

## 2024-05-01 ENCOUNTER — Encounter (INDEPENDENT_AMBULATORY_CARE_PROVIDER_SITE_OTHER): Payer: Self-pay | Admitting: Nurse Practitioner

## 2024-05-01 ENCOUNTER — Other Ambulatory Visit (INDEPENDENT_AMBULATORY_CARE_PROVIDER_SITE_OTHER): Payer: Self-pay | Admitting: Nurse Practitioner

## 2024-05-01 DIAGNOSIS — I8390 Asymptomatic varicose veins of unspecified lower extremity: Secondary | ICD-10-CM

## 2024-05-01 MED ORDER — OMEPRAZOLE 20 MG PO CPDR: 20.0000 mg | DELAYED_RELEASE_CAPSULE | Freq: Every day | ORAL | 0 refills | Status: DC

## 2024-05-01 NOTE — Telephone Encounter (Signed)
 Copied from CRM [id number]. Topic: Clinical - Medication Refill >> May 01, 2024  9:32 AM Bambi Bonine D wrote: Medication: gabapentin  (NEURONTIN ) 400 MG capsule,omeprazole  (PRILOSEC) 20 MG capsule  Has the patient contacted their pharmacy? Yes (Agent: If no, request that the patient contact the pharmacy for the refill. If patient does not wish to contact the pharmacy document the reason why and proceed with request.) (Agent: If yes, when and what did the pharmacy advise?)  This is the patient's preferred pharmacy:   [facility] [phone], [place] - [address] [place] [address] Phone: [phone] Fax: [phone]  Is this the correct pharmacy for this prescription? Yes If no, delete pharmacy and type the correct one.   Has the prescription been filled recently? No  Is the patient out of the medication? Yes  Has the patient been seen for an appointment in the last year OR does the patient have an upcoming appointment? Yes  Can we respond through MyChart? Yes  Agent: Please be advised that Rx refills may take up to 3 business days. We ask that you follow-up with your pharmacy.

## 2024-05-02 ENCOUNTER — Encounter (INDEPENDENT_AMBULATORY_CARE_PROVIDER_SITE_OTHER): Payer: Self-pay

## 2024-05-03 ENCOUNTER — Encounter (INDEPENDENT_AMBULATORY_CARE_PROVIDER_SITE_OTHER): Payer: Self-pay | Admitting: Nurse Practitioner

## 2024-05-03 ENCOUNTER — Ambulatory Visit (INDEPENDENT_AMBULATORY_CARE_PROVIDER_SITE_OTHER)

## 2024-05-03 ENCOUNTER — Ambulatory Visit (INDEPENDENT_AMBULATORY_CARE_PROVIDER_SITE_OTHER): Admitting: Nurse Practitioner

## 2024-05-03 VITALS — BP 143/96 | HR 90 | Resp 18 | Ht 70.0 in | Wt 289.2 lb

## 2024-05-03 DIAGNOSIS — I8390 Asymptomatic varicose veins of unspecified lower extremity: Secondary | ICD-10-CM | POA: Diagnosis not present

## 2024-05-03 DIAGNOSIS — M79671 Pain in right foot: Secondary | ICD-10-CM

## 2024-05-03 DIAGNOSIS — M79672 Pain in left foot: Secondary | ICD-10-CM

## 2024-05-03 DIAGNOSIS — M25462 Effusion, left knee: Secondary | ICD-10-CM | POA: Diagnosis not present

## 2024-05-03 DIAGNOSIS — I8392 Asymptomatic varicose veins of left lower extremity: Secondary | ICD-10-CM

## 2024-05-03 DIAGNOSIS — M25562 Pain in left knee: Secondary | ICD-10-CM | POA: Diagnosis not present

## 2024-05-09 ENCOUNTER — Encounter (INDEPENDENT_AMBULATORY_CARE_PROVIDER_SITE_OTHER): Payer: Self-pay | Admitting: Nurse Practitioner

## 2024-05-09 NOTE — Progress Notes (Signed)
 Subjective:    Patient ID: Kathy Gamble, female    DOB: 25-Oct-1976, 48 y.o.   MRN: [id number] Chief Complaint  Patient presents with   Follow-up    Pt conv bil venous reflex    Kathy Gamble is a 48 year old female who presents today as a referral from [facility] cable, NP in regards to ankle swelling.  The patient notes that the swelling began about a month or so ago.  She also had pain associated with it was felt that it was a sprain.  She was advised to utilize ice as well as compression socks.  She was also on amlodipine  which has been stopped.  Despite this she still continues to have some discomfort in her ankle as well as some swelling.  She denies swelling throughout the leg but she also notes that she has a swollen left knee.  She previously wore a brace on her left knee and recently stopped wearing it.  The swelling came after she stopped wearing her left knee brace.  She also endorses having some lower back pain and feels that her back goes out at times.  She also recently noted swelling in the right sole of her foot.  She has that when she stands on it it causes pressure and pain and discomfort.  She has noted to have a bit of a shuffling gait today.  Currently there are no open wounds or ulcerations.  She has small spider varicosities bilaterally.  She denies claudication-like symptoms.  Today patient has no evidence of DVT or superficial phlebitis in the left lower extremity.  No evidence of deep venous insufficiency or superficial venous reflux.    Review of Systems  Musculoskeletal:  Positive for arthralgias and joint swelling.  All other systems reviewed and are negative.      Objective:   Physical Exam Vitals reviewed.  HENT:     Head: Normocephalic.  Cardiovascular:     Rate and Rhythm: Normal rate.     Pulses: Normal pulses.  Pulmonary:     Effort: Pulmonary effort is normal.  Skin:    General: Skin is warm and dry.  Neurological:     Mental Status: She is alert and  oriented to person, place, and time.     Gait: Gait abnormal.  Psychiatric:        Mood and Affect: Mood normal.        Behavior: Behavior normal.        Thought Content: Thought content normal.        Judgment: Judgment normal.     BP (!) 143/96   Pulse 90   Resp 18   Ht 5\' 10"  (1.778 m)   Wt 289 lb 3.2 oz (131.2 kg)   LMP  (LMP Unknown)   BMI 41.50 kg/m   Past Medical History:  Diagnosis Date   Allergy    Bee sting allergy 03/02/2015   Chicken pox    Frequent headaches    GERD (gastroesophageal reflux disease)     Social History   Socioeconomic History   Marital status: Married    Spouse name: Not on file   Number of children: Not on file   Years of education: Not on file   Highest education level: Not on file  Occupational History   Occupation: disabled  Tobacco Use   Smoking status: Never   Smokeless tobacco: Never  Vaping Use   Vaping status: Never Used  Substance and Sexual Activity   Alcohol  use: No    Alcohol/week: 0.0 standard drinks of alcohol   Drug use: Not Currently    Types: Cocaine   Sexual activity: Yes    Birth control/protection: None  Other Topics Concern   Not on file  Social History Narrative   Cognitive dysfunction - lives with her husband   Social Drivers of Corporate investment banker Strain: Low Risk  (04/24/2024)   Overall Financial Resource Strain (CARDIA)    Difficulty of Paying Living Expenses: Not hard at all  Food Insecurity: No Food Insecurity (04/24/2024)   Hunger Vital Sign    Worried About Running Out of Food in the Last Year: Never true    Ran Out of Food in the Last Year: Never true  Transportation Needs: No Transportation Needs (04/24/2024)   PRAPARE - Administrator, Civil Service (Medical): No    Lack of Transportation (Non-Medical): No  Physical Activity: Insufficiently Active (04/24/2024)   Exercise Vital Sign    Days of Exercise per Week: 2 days    Minutes of Exercise per Session: 30 min  Stress:  No Stress Concern Present (04/24/2024)   [employer] of Occupational Health - Occupational Stress Questionnaire    Feeling of Stress : Not at all  Social Connections: Moderately Integrated (04/24/2024)   Social Connection and Isolation Panel [NHANES]    Frequency of Communication with Friends and Family: More than three times a week    Frequency of Social Gatherings with Friends and Family: More than three times a week    Attends Religious Services: More than 4 times per year    Active Member of [employer] or Organizations: No    Attends Banker Meetings: Never    Marital Status: Married  Catering manager Violence: Not At Risk (04/24/2024)   Humiliation, Afraid, Rape, and Kick questionnaire    Fear of Current or Ex-Partner: No    Emotionally Abused: No    Physically Abused: No    Sexually Abused: No    Past Surgical History:  Procedure Laterality Date   ADENOIDECTOMY     TONSILLECTOMY      Family History  Problem Relation Age of Onset   Stroke Mother    Mental retardation Mother    Cancer Maternal Grandmother    Cancer Paternal Grandmother        Breast   Heart disease Paternal Grandfather     Allergies  Allergen Reactions   Bee Venom Itching   Nortriptyline Other (See Comments)    GI distress   Latex Rash    The powder in the gloves causes rash       Latest Ref Rng & Units 02/17/2024   11:00 AM 01/20/2024   11:37 AM 11/16/2022    9:14 AM  CBC  WBC 4.0 - 10.5 K/uL 8.1  7.5  10.2   Hemoglobin 12.0 - 15.0 g/dL 29.5  62.1  30.8   Hematocrit 36.0 - 46.0 % 37.8  40.6  39.9   Platelets 150 - 400 K/uL 273  296.0  351.0       CMP     Component Value Date/Time   NA 139 02/17/2024 1100   K 3.5 02/17/2024 1100   CL 108 02/17/2024 1100   CO2 23 02/17/2024 1100   GLUCOSE 102 (H) 02/17/2024 1100   BUN 21 (H) 02/17/2024 1100   CREATININE 0.66 02/17/2024 1100   CREATININE 0.75 06/19/2022 1554   CALCIUM 8.6 (L) 02/17/2024 1100   PROT 6.3  01/20/2024 1137    ALBUMIN 3.8 01/20/2024 1137   AST 12 01/20/2024 1137   ALT 11 01/20/2024 1137   ALKPHOS 141 (H) 01/20/2024 1137   BILITOT 0.4 01/20/2024 1137   GFR 104.63 01/20/2024 1137   GFRNONAA >60 02/17/2024 1100     No results found.     Assessment & Plan:   1. Bilateral foot pain (Primary) The patient has some swelling on the bottom of her right foot and she notes that it is painful and uncomfortable to walk on.  She also notes that there is some tenderness as well.  Based on this I have a suspicion this could be related to plantar fasciitis however given the abnormal location for this to be considered vascular swelling, but the patient referred to podiatry for evaluation. 2. Acute left ankle pain Initially it was felt that the patient had a sprain and this has not been completely ruled out.  If the patient indeed had a sprain of her ankle this can have some lingering issues with swelling.  As noted above, we will have the patient referred to podiatry for further evaluation.  This referral was placed at her last visit so we have given her the number to contact podiatry directly  3. Varicose veins of ankle The varicosities that the patient currently displays at her ankle will spider varicosities and should not cause the amount of pain and swelling that she is currently experiencing.  I suspect there is an underlying musculoskeletal cause.  I also believe that much of her swelling may be related to her knee as well hence the referral as noted below.  She is advised to continue with use of medical grade compression and elevation for treatment of her swelling.  She will follow-up with us  on an as-needed basis following her workup by both podiatry and orthopedic surgery.  4. Pain and swelling of knee, left The patient notes that her pain and discomfort in her left knee began after she stopped wearing her previous knee brace.  It is noted today that when she ambulates she has a slight bump in her left leg.   Based on this we are going to send the patient to orthopedic surgery for further evaluation for possible issues with her knee.  Current Outpatient Medications on File Prior to Visit  Medication Sig Dispense Refill   albuterol  (VENTOLIN  HFA) 108 (90 Base) MCG/ACT inhaler INHALE 1-2 PUFFS INTO THE LUNGS EVERY 6 HOURS AS NEEDED FOR WHEEZING OR SHORTNESS OF BREATH 8.5 g 0   gabapentin  (NEURONTIN ) 400 MG capsule Take 1 capsule (400 mg total) by mouth at bedtime. 1 caspule in AM and 2 capsules in PM 90 capsule 1   hydrOXYzine (ATARAX) 50 MG tablet daily.     levocetirizine (XYZAL ) 5 MG tablet Take 1 tablet (5 mg total) by mouth every evening. 90 tablet 2   losartan  (COZAAR ) 50 MG tablet Take 1 tablet (50 mg total) by mouth daily. 90 tablet 0   montelukast  (SINGULAIR ) 10 MG tablet TAKE ONE TABLET BY MOUTH ONCE DAILY FOR sinuses 90 tablet 3   Multiple Vitamin (MULTIVITAMIN) capsule Take 1 capsule by mouth daily.     omeprazole  (PRILOSEC) 20 MG capsule Take 1 capsule (20 mg total) by mouth daily. 90 capsule 0   QUEtiapine (SEROQUEL) 25 MG tablet Take 25 mg at night for a week then increase to 50 mg at night and continue that dose     tiZANidine  (ZANAFLEX ) 4 MG tablet Take 1  tablet (4 mg total) by mouth at bedtime as needed for muscle spasms. 14 tablet 0   zonisamide (ZONEGRAN) 100 MG capsule Take 100 mg by mouth at bedtime.     Azelastine HCl 137 MCG/SPRAY SOLN Place into both nostrils. (Patient not taking: Reported on 05/03/2024)     verapamil (CALAN) 40 MG tablet Take 1 tablet by mouth 2 (two) times daily. (Patient not taking: Reported on 05/03/2024)     No current facility-administered medications on file prior to visit.    There are no Patient Instructions on file for this visit. No follow-ups on file.   Jalayne Ganesh E Eisha Chatterjee, NP

## 2024-05-11 DIAGNOSIS — H524 Presbyopia: Secondary | ICD-10-CM | POA: Diagnosis not present

## 2024-05-11 DIAGNOSIS — H18053 Posterior corneal pigmentations, bilateral: Secondary | ICD-10-CM | POA: Diagnosis not present

## 2024-05-22 ENCOUNTER — Ambulatory Visit: Payer: Self-pay

## 2024-05-22 NOTE — Telephone Encounter (Signed)
 Noted

## 2024-05-22 NOTE — Telephone Encounter (Signed)
 3rd attempt, no answer, unable to LVM. Routing to clinic for follow up.

## 2024-05-22 NOTE — Telephone Encounter (Signed)
 1st attempt, no answer, unable to LVM

## 2024-05-22 NOTE — Telephone Encounter (Signed)
 Second attempt; no answer; unable to leave vm.   Copied From CRM [id number]. Reason for Triage: Stomach pain along with diarrhea & headache in the middle of head for 2-3wks

## 2024-05-22 NOTE — Telephone Encounter (Signed)
 Unable to reach pt by phone. I did speak with pts husband Lavonia Powers (DPR signed) Lavonia Powers said pt was not at home and he would have pt cb when she got back home. Lavonia Powers did not know if pt was having any stomach pain, diarrhea or H/A now. Pt to call [phone] for triage when she gets back home. Sending note to [place] pool as FYI.

## 2024-05-24 DIAGNOSIS — B351 Tinea unguium: Secondary | ICD-10-CM | POA: Diagnosis not present

## 2024-05-24 DIAGNOSIS — L608 Other nail disorders: Secondary | ICD-10-CM | POA: Diagnosis not present

## 2024-05-24 DIAGNOSIS — M25372 Other instability, left ankle: Secondary | ICD-10-CM | POA: Diagnosis not present

## 2024-05-24 DIAGNOSIS — G8929 Other chronic pain: Secondary | ICD-10-CM | POA: Diagnosis not present

## 2024-05-24 DIAGNOSIS — I872 Venous insufficiency (chronic) (peripheral): Secondary | ICD-10-CM | POA: Diagnosis not present

## 2024-05-24 DIAGNOSIS — M25572 Pain in left ankle and joints of left foot: Secondary | ICD-10-CM | POA: Diagnosis not present

## 2024-05-24 DIAGNOSIS — G629 Polyneuropathy, unspecified: Secondary | ICD-10-CM | POA: Diagnosis not present

## 2024-05-24 DIAGNOSIS — M25472 Effusion, left ankle: Secondary | ICD-10-CM | POA: Diagnosis not present

## 2024-05-29 ENCOUNTER — Ambulatory Visit (INDEPENDENT_AMBULATORY_CARE_PROVIDER_SITE_OTHER): Admitting: Nurse Practitioner

## 2024-05-29 VITALS — BP 120/90 | HR 75 | Temp 98.2°F | Ht 70.0 in | Wt 287.6 lb

## 2024-05-29 DIAGNOSIS — I10 Essential (primary) hypertension: Secondary | ICD-10-CM

## 2024-05-29 DIAGNOSIS — M25572 Pain in left ankle and joints of left foot: Secondary | ICD-10-CM | POA: Diagnosis not present

## 2024-05-29 DIAGNOSIS — M7989 Other specified soft tissue disorders: Secondary | ICD-10-CM | POA: Diagnosis not present

## 2024-05-29 MED ORDER — HYDROCHLOROTHIAZIDE 12.5 MG PO TABS: 12.5000 mg | ORAL_TABLET | Freq: Every day | ORAL | 1 refills | Status: DC

## 2024-05-29 NOTE — Assessment & Plan Note (Signed)
 Ongoing issue.. she has been seen by vascular. We discontinued amlodipine . She is borderline with her blood pressure. Will treat with hydrochlorothiazide  12.5mg . follow up in one month

## 2024-05-29 NOTE — Progress Notes (Signed)
 Acute Office Visit  Subjective:     Patient ID: Kathy Gamble, female    DOB: Jul 04, 1976, 48 y.o.   MRN: [id number]  Chief Complaint  Patient presents with   Knee Pain    Pt complains of ongoing pain in both knees. States that L ankle is still swollen. Pt pain level is 8-9. Has trouble sleeping. Puts ice on knees during the day.      Patient is in today for knee pain  States that when she walks her knees will pop. States that it has been 2 weeks or more. States that her left ankle is still swelling and when she puts weight on it. States that the ankle does not hurt when she is sitting still. State that it hurts at night and sometimes durnig the day. Stats that if feels like she is squeezing her ankle.  States that hse has tried [employer], [employer], and excedrin migraine medicine and does hlep some with sleep   Review of Systems  Constitutional:  Negative for chills and fever.  Respiratory:  Negative for shortness of breath.   Cardiovascular:  Positive for leg swelling. Negative for chest pain.  Musculoskeletal:  Positive for joint pain.  Neurological:  Positive for headaches.        Objective:    BP (!) 120/90   Pulse 75   Temp 98.2 F (36.8 C) (Oral)   Ht 5' 10 (1.778 m)   Wt 287 lb 9.6 oz (130.5 kg)   LMP  (LMP Unknown)   SpO2 94%   BMI 41.27 kg/m  BP Readings from Last 3 Encounters:  05/29/24 (!) 120/90  05/03/24 (!) 143/96  04/24/24 118/75   Wt Readings from Last 3 Encounters:  05/29/24 287 lb 9.6 oz (130.5 kg)  05/03/24 289 lb 3.2 oz (131.2 kg)  04/24/24 287 lb (130.2 kg)   SpO2 Readings from Last 3 Encounters:  05/29/24 94%  04/14/24 95%  03/22/24 95%      Physical Exam Vitals and nursing note reviewed.  Constitutional:      Appearance: Normal appearance.   Cardiovascular:     Rate and Rhythm: Normal rate and regular rhythm.     Heart sounds: Normal heart sounds.  Pulmonary:     Effort: Pulmonary effort is normal.     Breath sounds: Normal breath  sounds.   Musculoskeletal:     Left knee: No swelling, ecchymosis or bony tenderness. Normal range of motion. No tenderness. Normal pulse.     Left lower leg: Edema (non pitting) present.     Right ankle: Normal pulse.     Left ankle: Normal. No tenderness. Normal pulse.     Comments: McMurray's test negative   Neurological:     Mental Status: She is alert.     No results found for any visits on 05/29/24.      Assessment & Plan:   Problem List Items Addressed This Visit       Cardiovascular and Mediastinum   Primary hypertension   Currenlty maintained on losartan  50mg  daily. Borderline BP with some swelling will add on hydrochlorothiazide  12.5mg  daily       Relevant Medications   hydrochlorothiazide  (HYDRODIURIL ) 12.5 MG tablet     Other   Acute left ankle pain - Primary   She has seen and been evaluated by poidatry on 05/24/2024. Earlier this year she had an xray done of that ankle and US  to r/o dvt. She can use tylenol  OTC as directed.  Left leg swelling   Ongoing issue.. she has been seen by vascular. We discontinued amlodipine . She is borderline with her blood pressure. Will treat with hydrochlorothiazide  12.5mg . follow up in one month       Relevant Medications   hydrochlorothiazide  (HYDRODIURIL ) 12.5 MG tablet    Meds ordered this encounter  Medications   DISCONTD: hydrochlorothiazide  (HYDRODIURIL ) 12.5 MG tablet    Sig: Take 1 tablet (12.5 mg total) by mouth daily.    Dispense:  30 tablet    Refill:  1    Supervising Provider:   Deri Fleet A [[id number]]   hydrochlorothiazide  (HYDRODIURIL ) 12.5 MG tablet    Sig: Take 1 tablet (12.5 mg total) by mouth daily.    Dispense:  30 tablet    Refill:  1    Supervising Provider:   Deri Fleet A [[id number]]    Return in about 4 weeks (around 06/26/2024) for BP recheck/BMp/Swelling .  Kathy Shay, NP

## 2024-05-29 NOTE — Assessment & Plan Note (Signed)
 She has seen and been evaluated by poidatry on 05/24/2024. Earlier this year she had an xray done of that ankle and US  to r/o dvt. She can use tylenol  OTC as directed.

## 2024-05-29 NOTE — Assessment & Plan Note (Signed)
 Currenlty maintained on losartan  50mg  daily. Borderline BP with some swelling will add on hydrochlorothiazide  12.5mg  daily

## 2024-05-29 NOTE — Patient Instructions (Addendum)
 Nice to see you today I have sent in a fluid pill to help with the swelling and it will help with blood pressure some too You can use tylenol  as directed on the bottle   Check your blood pressure for me at home daily   [facility]  Address: [address] [place], [place], [place] [address] Hours:  Closes soon ? 5?PM ? Opens 7?AM Tue Confirmed by this business 7 weeks ago Phone: [phone]

## 2024-05-30 ENCOUNTER — Other Ambulatory Visit: Payer: Self-pay | Admitting: Nurse Practitioner

## 2024-06-01 DIAGNOSIS — R42 Dizziness and giddiness: Secondary | ICD-10-CM | POA: Diagnosis not present

## 2024-06-01 DIAGNOSIS — R202 Paresthesia of skin: Secondary | ICD-10-CM | POA: Diagnosis not present

## 2024-06-01 DIAGNOSIS — R519 Headache, unspecified: Secondary | ICD-10-CM | POA: Diagnosis not present

## 2024-06-01 DIAGNOSIS — G479 Sleep disorder, unspecified: Secondary | ICD-10-CM | POA: Diagnosis not present

## 2024-06-01 DIAGNOSIS — R200 Anesthesia of skin: Secondary | ICD-10-CM | POA: Diagnosis not present

## 2024-06-01 DIAGNOSIS — M79672 Pain in left foot: Secondary | ICD-10-CM | POA: Diagnosis not present

## 2024-06-15 ENCOUNTER — Telehealth: Payer: Self-pay | Admitting: Nurse Practitioner

## 2024-06-15 DIAGNOSIS — J302 Other seasonal allergic rhinitis: Secondary | ICD-10-CM

## 2024-06-15 NOTE — Telephone Encounter (Signed)
 Copied from CRM [id number]. Topic: Clinical - Medication Refill >> Jun 15, 2024  1:52 PM Robinson H wrote: Medication: omeprazole  (PRILOSEC) 20 MG capsule, albuterol  (VENTOLIN  HFA) 108 (90 Base) MCG/ACT inhaler  Has the patient contacted their pharmacy? Yes, needs new prescription (Agent: If no, request that the patient contact the pharmacy for the refill. If patient does not wish to contact the pharmacy document the reason why and proceed with request.) (Agent: If yes, when and what did the pharmacy advise?)  This is the patient's preferred pharmacy:    [facility] [address] [place] - [address] [place] [address] Phone: [phone] Fax: [phone]  Is this the correct pharmacy for this prescription? Yes If no, delete pharmacy and type the correct one.   Has the prescription been filled recently? No  Is the patient out of the medication? Yes  Has the patient been seen for an appointment in the last year OR does the patient have an upcoming appointment? Yes  Can we respond through MyChart? Yes  Agent: Please be advised that Rx refills may take up to 3 business days. We ask that you follow-up with your pharmacy.

## 2024-06-19 MED ORDER — ALBUTEROL SULFATE HFA 108 (90 BASE) MCG/ACT IN AERS: INHALATION_SPRAY | RESPIRATORY_TRACT | 0 refills | Status: DC

## 2024-06-19 MED ORDER — OMEPRAZOLE 20 MG PO CPDR: 20.0000 mg | DELAYED_RELEASE_CAPSULE | Freq: Every day | ORAL | 0 refills | Status: DC

## 2024-06-26 ENCOUNTER — Ambulatory Visit (INDEPENDENT_AMBULATORY_CARE_PROVIDER_SITE_OTHER): Admitting: Nurse Practitioner

## 2024-06-26 VITALS — BP 120/88 | HR 88 | Temp 97.9°F | Ht 70.0 in | Wt 285.2 lb

## 2024-06-26 DIAGNOSIS — I10 Essential (primary) hypertension: Secondary | ICD-10-CM

## 2024-06-26 DIAGNOSIS — R600 Localized edema: Secondary | ICD-10-CM | POA: Diagnosis not present

## 2024-06-26 LAB — BASIC METABOLIC PANEL WITH GFR
BUN: 16 mg/dL (ref 6–23)
CO2: 30 meq/L (ref 19–32)
Calcium: 9.1 mg/dL (ref 8.4–10.5)
Chloride: 101 meq/L (ref 96–112)
Creatinine, Ser: 0.8 mg/dL (ref 0.40–1.20)
GFR: 87.62 mL/min (ref >60.00)
Glucose, Bld: 88 mg/dL (ref 70–99)
Potassium: 3.6 meq/L (ref 3.5–5.1)
Sodium: 141 meq/L (ref 135–145)

## 2024-06-26 MED ORDER — QUETIAPINE FUMARATE 25 MG PO TABS: 75.0000 mg | ORAL_TABLET | Freq: Every day | ORAL | Status: AC

## 2024-06-26 NOTE — Assessment & Plan Note (Signed)
 Currently on losartan  50mg  and hxtz 12.5mg  daily. She is tolerating medications well. BP controlled. Continue medications, pending BMP.

## 2024-06-26 NOTE — Assessment & Plan Note (Signed)
 Improved with conservative therapy and discontinuation of amlodipine  and addition of hydrochlorothiazide . She is followed by vascular and has an appt with them in November. Continue medications as prescribed. Follow up with specialist as recommended

## 2024-06-26 NOTE — Patient Instructions (Signed)
 Nice to see you today Your blood pressure looks good I will be in touch with the labs once I have them  [facility] (weight loss clinic)  Address: [address] [place], [place], [place] [address] Hours:  Closes soon ? 5?PM ? Opens 7?AM Tue Confirmed by this business 7 weeks ago Phone: [phone]

## 2024-06-26 NOTE — Progress Notes (Signed)
   Established Patient Office Visit  Subjective   Patient ID: Kathy Gamble, female    DOB: September 25, 1976  Age: 48 y.o. MRN: [id number]  Chief Complaint  Patient presents with   Follow-up    Pt complains of no change in swelling from 4 weeks ago.     HPI  HTN: patient is currently on hydrochlorothiazide  12.5mg  daily, losartan  50mg  daily and verapamil 40mg  BID. She is here for a recheck on bp and leg swelling. Sttes that her swelling is ok. States that she is elevating her legs at home. She is wearing compression socks during the day and taking them off at night.    She is also followed by vascular and last seen on 05/03/2024  Of noted patient was seen by neurology via telemedicine and was stared back on nortriptyline 10mg  at bedtime, Effexor 37.5mg  BID, zonegran 100mg  daily and aimovig 140mg  every 28 days.  She was to continue Seroquel  75mg  nightly and gabapentin  300mg  TID.   Review of Systems  Constitutional:  Negative for chills and fever.  Respiratory:  Negative for shortness of breath.   Cardiovascular:  Negative for chest pain.  Neurological:  Negative for headaches.  Psychiatric/Behavioral:  Negative for hallucinations and suicidal ideas.       Objective:     BP 120/88   Pulse 88   Temp 97.9 F (36.6 C) (Oral)   Ht 5' 10 (1.778 m)   Wt 285 lb 3.2 oz (129.4 kg)   LMP  (LMP Unknown)   SpO2 96%   BMI 40.92 kg/m  BP Readings from Last 3 Encounters:  06/26/24 120/88  05/29/24 (!) 120/90  05/03/24 (!) 143/96   Wt Readings from Last 3 Encounters:  06/26/24 285 lb 3.2 oz (129.4 kg)  05/29/24 287 lb 9.6 oz (130.5 kg)  05/03/24 289 lb 3.2 oz (131.2 kg)   SpO2 Readings from Last 3 Encounters:  06/26/24 96%  05/29/24 94%  04/14/24 95%      Physical Exam Vitals and nursing note reviewed.  Constitutional:      Appearance: Normal appearance.  Cardiovascular:     Rate and Rhythm: Normal rate and regular rhythm.     Heart sounds: Normal heart sounds.  Pulmonary:      Effort: Pulmonary effort is normal.     Breath sounds: Normal breath sounds.  Musculoskeletal:     Right lower leg: Edema (trace) present.     Left lower leg: No edema.  Neurological:     Mental Status: She is alert.      No results found for any visits on 06/26/24.    The 10-year ASCVD risk score (Arnett DK, et al., 2019) is: 0.9%    Assessment & Plan:   Problem List Items Addressed This Visit       Cardiovascular and Mediastinum   Primary hypertension - Primary   Currently on losartan  50mg  and hxtz 12.5mg  daily. She is tolerating medications well. BP controlled. Continue medications, pending BMP.      Relevant Orders   Basic metabolic panel with GFR     Other   Bilateral lower extremity edema   Improved with conservative therapy and discontinuation of amlodipine  and addition of hydrochlorothiazide . She is followed by vascular and has an appt with them in November. Continue medications as prescribed. Follow up with specialist as recommended        Return in about 7 months (around 01/27/2025) for CPE and Labs.    Adina Crandall, NP

## 2024-06-28 ENCOUNTER — Ambulatory Visit: Payer: Self-pay | Admitting: Nurse Practitioner

## 2024-07-12 ENCOUNTER — Other Ambulatory Visit: Payer: Self-pay | Admitting: Nurse Practitioner

## 2024-07-12 NOTE — Telephone Encounter (Unsigned)
 Copied from CRM [id number]. Topic: Clinical - Medication Refill >> Jul 12, 2024  4:55 PM Zebedee SAUNDERS wrote: Medication: gabapentin  (NEURONTIN ) 400 MG capsule  Has the patient contacted their pharmacy? Yes (Agent: If no, request that the patient contact the pharmacy for the refill. If patient does not wish to contact the pharmacy document the reason why and proceed with request.) (Agent: If yes, when and what did the pharmacy advise?)Pharmacy need script from PCP.   This is the patient's preferred pharmacy:   [facility] [address] [place] - [address] [place] [address] Phone: [phone] Fax: [phone]  Is this the correct pharmacy for this prescription? Yes If no, delete pharmacy and type the correct one.   Has the prescription been filled recently? Yes  Is the patient out of the medication? Yes  Has the patient been seen for an appointment in the last year OR does the patient have an upcoming appointment? Yes  Can we respond through MyChart? Yes  Agent: Please be advised that Rx refills may take up to 3 business days. We ask that you follow-up with your pharmacy.

## 2024-07-13 MED ORDER — GABAPENTIN 400 MG PO CAPS: 400.0000 mg | ORAL_CAPSULE | Freq: Every day | ORAL | 1 refills | Status: DC

## 2024-07-21 ENCOUNTER — Other Ambulatory Visit: Payer: Self-pay | Admitting: Nurse Practitioner

## 2024-07-21 DIAGNOSIS — J302 Other seasonal allergic rhinitis: Secondary | ICD-10-CM

## 2024-07-24 ENCOUNTER — Ambulatory Visit (INDEPENDENT_AMBULATORY_CARE_PROVIDER_SITE_OTHER)
Admission: RE | Admit: 2024-07-24 | Discharge: 2024-07-24 | Disposition: A | Discharge status: Home / Self Care | Source: Ambulatory Visit | Attending: Family Medicine | Admitting: Family Medicine

## 2024-07-24 ENCOUNTER — Ambulatory Visit: Payer: Self-pay | Admitting: Family Medicine

## 2024-07-24 ENCOUNTER — Ambulatory Visit: Payer: Self-pay | Admitting: *Deleted

## 2024-07-24 ENCOUNTER — Ambulatory Visit: Admitting: Family Medicine

## 2024-07-24 ENCOUNTER — Encounter: Payer: Self-pay | Admitting: Family Medicine

## 2024-07-24 VITALS — BP 136/82 | HR 77 | Temp 97.6°F | Ht 70.0 in | Wt 282.5 lb

## 2024-07-24 DIAGNOSIS — M79645 Pain in left finger(s): Secondary | ICD-10-CM

## 2024-07-24 NOTE — Telephone Encounter (Signed)
Noted. Appreciate Dr. Marliss Coots evaluation.

## 2024-07-24 NOTE — Assessment & Plan Note (Addendum)
 Left middle finger (mid digit , middle pip joint and surrounding soft tissue swelling) May have started with injury when she was getting up after sitting on the floor  Pt generally writes with this hand but is unsure if she is truly left handed  Limited flexion today due to swelling and discomfort  Differential includes fracture, deg change, tendon injury , arthritis  Xray ordered -no fractures noted / no acute joint changes , reassuring    Encouraged use of cold compress for 10 minutes at a time Ibuprofen with foot over the counter 400 mg up to tid prn pain   Call back and Er precautions noted in detail today

## 2024-07-24 NOTE — Patient Instructions (Addendum)
 Hand (finger) xray today   We will reach out with xray results and make a plan   Use cold compress on the finger for 10 minutes whenever you get a chance for pain and swelling You can also get some ibuprofen (advil)  over the counter and take 2 pills (total of 400 mg ) with food up to every 8 hours for pain  (short term)  to give you some relief

## 2024-07-24 NOTE — Progress Notes (Signed)
 Subjective:    Patient ID: Kathy Gamble, female    DOB: July 25, 1976, 48 y.o.   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   Noticed finger was swollen  Middle finger of left hand  No pus    Noticed it when getting up from sitting on the floor (sore) and swelling came after  May have injured it getting up  Hurts to bend Rest of hand ok   Sometimes hands swell in summer    Does not tend to chew nails  Never had anything like this before    Lab Results  Component Value Date   NA 141 06/26/2024   K 3.6 06/26/2024   CO2 30 06/26/2024   GLUCOSE 88 06/26/2024   BUN 16 06/26/2024   CREATININE 0.80 06/26/2024   CALCIUM 9.1 06/26/2024   GFR 87.62 06/26/2024   GFRNONAA >60 02/17/2024   Lab Results  Component Value Date   ALT 11 01/20/2024   AST 12 01/20/2024   ALKPHOS 141 (H) 01/20/2024   BILITOT 0.4 01/20/2024     DG Hand Complete Left Result Date: 07/24/2024 CLINICAL DATA:  Pain and swelling in the middle finger E. Injury last week. EXAM: LEFT HAND - COMPLETE 3+ VIEW COMPARISON:  None Available. FINDINGS: There is no evidence of fracture or dislocation. There is no evidence of arthropathy or other focal bone abnormality. There is mild soft tissue swelling of the proximal third finger. IMPRESSION: Mild soft tissue swelling of the proximal third finger. No acute fracture or dislocation. Electronically Signed   By: Greig Pique M.D.   On: 07/24/2024 16:31     Patient Active Problem List   Diagnosis Date Noted   Finger pain, left 07/24/2024   Left leg swelling 05/29/2024   Acute left ankle pain 02/22/2024   Foot sprain, left, initial encounter 02/10/2024   Chronic venous insufficiency of lower  extremity 02/10/2024   Varicose veins of ankle 02/10/2024   Bilateral lower extremity edema 01/20/2024   Hearing loss 12/22/2023   Neuropathy 11/30/2023   Bruising 10/01/2023   Left leg pain 08/20/2023   Injury of great toe of left foot 06/11/2023   Morbid obesity (HCC) 04/05/2023   Encounter for weight loss counseling 04/05/2023   Thoracic spine pain 02/03/2023   Chronic neck and back pain 02/03/2023   Mild persistent asthma 08/14/2022   Onychomycosis 08/14/2022   Great toe pain, right 08/14/2022   Abnormal CT of spine 03/31/2022   Epistaxis 03/18/2022   Primary hypertension 03/18/2022   TMJ (temporomandibular joint syndrome) 12/19/2021   Preventative health care 12/03/2021   Cognitive dysfunction 07/21/2021   Chronic migraine with aura 05/05/2021   Viral URI with cough 11/01/2018   Seasonal allergic rhinitis due to pollen 09/20/2018   Bilateral foot pain 08/26/2018   Obesity (BMI 30-39.9) 01/02/2015   Gastroesophageal reflux disease without esophagitis 12/28/2014   Past Medical History:  Diagnosis Date   Allergy    Bee sting allergy 03/02/2015   Chicken pox    Frequent headaches    GERD (gastroesophageal reflux disease)    Past Surgical History:  Procedure Laterality  Date   ADENOIDECTOMY     TONSILLECTOMY     Social History   Tobacco Use   Smoking status: Never   Smokeless tobacco: Never  Vaping Use   Vaping status: Never Used  Substance Use Topics   Alcohol use: No    Alcohol/week: 0.0 standard drinks of alcohol   Drug use: Not Currently    Types: Cocaine   Family History  Problem Relation Age of Onset   Stroke Mother    Mental retardation Mother    Cancer Maternal Grandmother    Cancer Paternal Grandmother        Breast   Heart disease Paternal Grandfather    Allergies  Allergen Reactions   Bee Venom Itching   Nortriptyline Other (See Comments)    GI distress   Latex Rash    The powder in the gloves causes rash   Current Outpatient Medications  on File Prior to Visit  Medication Sig Dispense Refill   albuterol  (VENTOLIN  HFA) 108 (90 Base) MCG/ACT inhaler INHALE 1-2 PUFFS INTO THE LUNGS EVERY 6 HOURS AS NEEDED FOR WHEEZING OR SHORTNESS OF BREATH. 8.5 each 1   Azelastine HCl 137 MCG/SPRAY SOLN Place into both nostrils.     hydrochlorothiazide  (HYDRODIURIL ) 12.5 MG tablet Take 1 tablet (12.5 mg total) by mouth daily. 30 tablet 1   hydrOXYzine (ATARAX) 50 MG tablet daily.     levocetirizine (XYZAL ) 5 MG tablet Take 1 tablet (5 mg total) by mouth every evening. 90 tablet 2   losartan  (COZAAR ) 50 MG tablet TAKE 1 TABLET(50 MG) BY MOUTH DAILY 90 tablet 0   montelukast  (SINGULAIR ) 10 MG tablet TAKE ONE TABLET BY MOUTH ONCE DAILY FOR sinuses 90 tablet 3   Multiple Vitamin (MULTIVITAMIN) capsule Take 1 capsule by mouth daily.     nortriptyline (PAMELOR) 10 MG capsule Take 10 mg by mouth.     omeprazole  (PRILOSEC) 20 MG capsule Take 1 capsule (20 mg total) by mouth daily. 90 capsule 0   QUEtiapine  (SEROQUEL ) 25 MG tablet Take 3 tablets (75 mg total) by mouth at bedtime.     tiZANidine  (ZANAFLEX ) 4 MG tablet Take 1 tablet (4 mg total) by mouth at bedtime as needed for muscle spasms. 14 tablet 0   venlafaxine (EFFEXOR) 37.5 MG tablet Take 37.5 mg by mouth.     verapamil (CALAN) 40 MG tablet Take 1 tablet by mouth 2 (two) times daily.     zonisamide (ZONEGRAN) 100 MG capsule Take 100 mg by mouth at bedtime.     No current facility-administered medications on file prior to visit.    Review of Systems  Constitutional:  Negative for chills, fatigue and fever.  Musculoskeletal:        Pain in left middle finger   Neurological:  Negative for weakness and numbness.       Objective:   Physical Exam Constitutional:      Appearance: Normal appearance. She is obese.  Cardiovascular:     Rate and Rhythm: Normal rate and regular rhythm.  Musculoskeletal:     Comments: Left hand  Swollen middle PIP joint and surrounding soft tissues  No erythema  or warmth or ecchymosis or skin interruption  Flexion is limited by discomfort and swelling  No triggering noted  Normal perfusion     Skin:    Findings: No bruising, erythema, lesion or rash.  Neurological:     Mental Status: She is alert.     Cranial Nerves: No cranial nerve deficit.  Psychiatric:        Mood and Affect: Mood normal.           Assessment & Plan:   Problem List Items Addressed This Visit       Other   Finger pain, left - Primary   Left middle finger (mid digit , middle pip joint and surrounding soft tissue swelling) May have started with injury when she was getting up after sitting on the floor  Pt generally writes with this hand but is unsure if she is truly left handed  Limited flexion today due to swelling and discomfort  Differential includes fracture, deg change, tendon injury , arthritis  Xray ordered -no fractures noted / no acute joint changes , reassuring    Encouraged use of cold compress for 10 minutes at a time Ibuprofen with foot over the counter 400 mg up to tid prn pain   Call back and Er precautions noted in detail today          Relevant Orders   DG Hand Complete Left (Completed)

## 2024-07-24 NOTE — Telephone Encounter (Signed)
 FYI Only or Action Required?: FYI only for provider.  Patient was last seen in primary care on 06/26/2024 by Kathy Lynwood HERO, NP.  Called Nurse Triage reporting Joint Swelling.  Symptoms began a week ago.  Interventions attempted: Nothing.  Symptoms are: gradually worsening.  Triage Disposition: See Physician Within 24 Hours  Patient/caregiver understands and will follow disposition?: Yes            Copied from CRM #[id number]. Topic: Clinical - Red Word Triage >> Jul 24, 2024 11:38 AM Harlene ORN wrote: Red Word that prompted transfer to Nurse Triage: finger on left hand is very swollen happened a week ago. Reason for Disposition  Looks infected (spreading redness, pus)  Answer Assessment - Initial Assessment Questions Appt scheduled today none available with PCP.  No fever reported      1. ONSET: When did the pain start?      Last week  2. LOCATION and RADIATION: Where is the pain located?  (e.g., fingertip, around nail, joint, entire      Knuckle left middle finger swelling and redness  3. SEVERITY: How bad is the pain? What does it keep you from doing?   (Scale 1-10; or mild, moderate, severe)     7/10  4. APPEARANCE: What does the finger look like? (e.g., redness, swelling, bruising, pallor)     Redness swelling  5. WORK OR EXERCISE: Has there been any recent work or exercise that involved this part (i.e., fingers or hand) of the body?     Clemens last  6. CAUSE: What do you think is causing the pain?     Not sure  7. AGGRAVATING FACTORS: What makes the pain worse? (e.g., using computer)     na 8. OTHER SYMPTOMS: Do you have any other symptoms? (e.g., fever, neck pain, numbness)     Left middle finger pain swelling and redness after fall 9. PREGNANCY: Is there any chance you are pregnant? When was your last menstrual period?     na  Protocols used: Finger Pain-A-AH

## 2024-07-24 NOTE — Telephone Encounter (Signed)
 Will see patient then Agree with ER and UC precautions

## 2024-07-25 ENCOUNTER — Other Ambulatory Visit: Payer: Self-pay | Admitting: Nurse Practitioner

## 2024-07-25 DIAGNOSIS — M7989 Other specified soft tissue disorders: Secondary | ICD-10-CM

## 2024-07-31 NOTE — Telephone Encounter (Signed)
 Left voicemail for patient to call the office back.

## 2024-08-01 ENCOUNTER — Ambulatory Visit (INDEPENDENT_AMBULATORY_CARE_PROVIDER_SITE_OTHER): Admitting: Family Medicine

## 2024-08-01 ENCOUNTER — Encounter: Payer: Self-pay | Admitting: Family Medicine

## 2024-08-01 VITALS — BP 130/78 | HR 82 | Temp 97.7°F | Ht 70.0 in | Wt 276.1 lb

## 2024-08-01 DIAGNOSIS — M79645 Pain in left finger(s): Secondary | ICD-10-CM

## 2024-08-01 DIAGNOSIS — J0110 Acute frontal sinusitis, unspecified: Secondary | ICD-10-CM | POA: Diagnosis not present

## 2024-08-01 DIAGNOSIS — J301 Allergic rhinitis due to pollen: Secondary | ICD-10-CM | POA: Diagnosis not present

## 2024-08-01 MED ORDER — AMOXICILLIN-POT CLAVULANATE 875-125 MG PO TABS
1.0000 | ORAL_TABLET | Freq: Two times a day (BID) | ORAL | 0 refills | Status: DC
Start: 2024-08-01 — End: 2024-09-14

## 2024-08-01 NOTE — Assessment & Plan Note (Signed)
 With symptoms of acute sinusitis today   Will treat that  Continue sinfulair and xyzal  and astelin ns   May consider adding steroid ns if congestion does not improve -per pt may have caused some nosebleed in past

## 2024-08-01 NOTE — Assessment & Plan Note (Signed)
 Improved swelling and pain but not resolved  Continues ice and nsaid   Referral made to ortho/hand

## 2024-08-01 NOTE — Progress Notes (Signed)
 Subjective:    Patient ID: Kathy Gamble, female    DOB: Jun 26, 1976, 48 y.o.   MRN: [id number]  HPI  Wt Readings from Last 3 Encounters:  08/01/24 276 lb 2 oz (125.2 kg)  07/24/24 282 lb 8 oz (128.1 kg)  06/26/24 285 lb 3.2 oz (129.4 kg)   39.62 kg/m  Vitals:   08/01/24 1050  BP: 130/78  Pulse: 82  Temp: 97.7 F (36.5 C)  SpO2: 98%    Pt presents for re check of hand/finger Also worse allergies lately     Was seen on 8/11 Injured finger (left 3rd) when getting up from the floor to do something   Presented with swelling/discomfort and limited flexion   Xray DG Hand Complete Left Result Date: 07/24/2024 CLINICAL DATA:  Pain and swelling in the middle finger E. Injury last week. EXAM: LEFT HAND - COMPLETE 3+ VIEW COMPARISON:  None Available. FINDINGS: There is no evidence of fracture or dislocation. There is no evidence of arthropathy or other focal bone abnormality. There is mild soft tissue swelling of the proximal third finger. IMPRESSION: Mild soft tissue swelling of the proximal third finger. No acute fracture or dislocation. Electronically Signed   By: Greig Pique M.D.   On: 07/24/2024 16:31   Overall reassuring   Cold compress Using ice night and day    Ibuprofen over the counter  Takes twice daily   Some improvement in swelling  Pain is improved but not gone   Allergies worse lately  Ears are full feeling Sometimes painful   Blowing some green nasal d/c No fever  Some facial pain -frontal     Xyzal   Singulair   Astelin ns    Patient Active Problem List   Diagnosis Date Noted   Finger pain, left 07/24/2024   Left leg swelling 05/29/2024   Acute left ankle pain 02/22/2024   Foot sprain, left, initial encounter 02/10/2024   Chronic venous insufficiency of lower extremity 02/10/2024   Varicose veins of ankle 02/10/2024   Bilateral lower extremity edema 01/20/2024   Hearing loss 12/22/2023   Neuropathy 11/30/2023   Bruising 10/01/2023    Left leg pain 08/20/2023   Injury of great toe of left foot 06/11/2023   Morbid obesity (HCC) 04/05/2023   Encounter for weight loss counseling 04/05/2023   Acute frontal sinusitis 02/09/2023   Thoracic spine pain 02/03/2023   Chronic neck and back pain 02/03/2023   Mild persistent asthma 08/14/2022   Onychomycosis 08/14/2022   Great toe pain, right 08/14/2022   Abnormal CT of spine 03/31/2022   Epistaxis 03/18/2022   Primary hypertension 03/18/2022   TMJ (temporomandibular joint syndrome) 12/19/2021   Preventative health care 12/03/2021   Cognitive dysfunction 07/21/2021   Chronic migraine with aura 05/05/2021   Seasonal allergic rhinitis due to pollen 09/20/2018   Bilateral foot pain 08/26/2018   Obesity (BMI 30-39.9) 01/02/2015   Gastroesophageal reflux disease without esophagitis 12/28/2014   Past Medical History:  Diagnosis Date   Allergy    Bee sting allergy 03/02/2015   Chicken pox    Frequent headaches    GERD (gastroesophageal reflux disease)    Past Surgical History:  Procedure Laterality Date   ADENOIDECTOMY     TONSILLECTOMY     Social History   Tobacco Use   Smoking status: Never   Smokeless tobacco: Never  Vaping Use   Vaping status: Never Used  Substance Use Topics   Alcohol use: No    Alcohol/week: 0.0 standard  drinks of alcohol   Drug use: Not Currently    Types: Cocaine   Family History  Problem Relation Age of Onset   Stroke Mother    Mental retardation Mother    Cancer Maternal Grandmother    Cancer Paternal Grandmother        Breast   Heart disease Paternal Grandfather    Allergies  Allergen Reactions   Bee Venom Itching   Nortriptyline Other (See Comments)    GI distress   Latex Rash    The powder in the gloves causes rash   Current Outpatient Medications on File Prior to Visit  Medication Sig Dispense Refill   albuterol  (VENTOLIN  HFA) 108 (90 Base) MCG/ACT inhaler INHALE 1-2 PUFFS INTO THE LUNGS EVERY 6 HOURS AS NEEDED FOR  WHEEZING OR SHORTNESS OF BREATH. 8.5 each 1   Azelastine HCl 137 MCG/SPRAY SOLN Place into both nostrils.     hydrochlorothiazide  (HYDRODIURIL ) 12.5 MG tablet TAKE 1 TABLET BY MOUTH EVERY DAY 90 tablet 1   hydrOXYzine (ATARAX) 50 MG tablet daily.     levocetirizine (XYZAL ) 5 MG tablet Take 1 tablet (5 mg total) by mouth every evening. 90 tablet 2   losartan  (COZAAR ) 50 MG tablet TAKE 1 TABLET(50 MG) BY MOUTH DAILY 90 tablet 0   montelukast  (SINGULAIR ) 10 MG tablet TAKE ONE TABLET BY MOUTH ONCE DAILY FOR sinuses 90 tablet 3   Multiple Vitamin (MULTIVITAMIN) capsule Take 1 capsule by mouth daily.     nortriptyline (PAMELOR) 10 MG capsule Take 10 mg by mouth.     omeprazole  (PRILOSEC) 20 MG capsule Take 1 capsule (20 mg total) by mouth daily. 90 capsule 0   QUEtiapine  (SEROQUEL ) 25 MG tablet Take 3 tablets (75 mg total) by mouth at bedtime.     tiZANidine  (ZANAFLEX ) 4 MG tablet Take 1 tablet (4 mg total) by mouth at bedtime as needed for muscle spasms. 14 tablet 0   venlafaxine (EFFEXOR) 37.5 MG tablet Take 37.5 mg by mouth.     verapamil (CALAN) 40 MG tablet Take 1 tablet by mouth 2 (two) times daily.     zonisamide (ZONEGRAN) 100 MG capsule Take 100 mg by mouth at bedtime.     No current facility-administered medications on file prior to visit.    Review of Systems  Constitutional:  Negative for activity change, appetite change, fatigue, fever and unexpected weight change.  HENT:  Positive for congestion, postnasal drip, sinus pressure and sinus pain. Negative for ear pain, nosebleeds, rhinorrhea and sore throat.   Eyes:  Negative for pain, redness, itching and visual disturbance.  Respiratory:  Negative for cough, shortness of breath and wheezing.   Cardiovascular:  Negative for chest pain and palpitations.  Gastrointestinal:  Negative for abdominal pain, blood in stool, constipation, diarrhea, nausea and vomiting.  Endocrine: Negative for polydipsia and polyuria.  Genitourinary:  Negative  for dysuria, frequency and urgency.  Musculoskeletal:  Negative for arthralgias, back pain and myalgias.       Left middle finger pain and swelling Improved but not resolved   Skin:  Negative for pallor and rash.  Allergic/Immunologic: Negative for environmental allergies and immunocompromised state.  Neurological:  Negative for dizziness, tremors, syncope, weakness, numbness and headaches.  Hematological:  Negative for adenopathy. Does not bruise/bleed easily.  Psychiatric/Behavioral:  Negative for decreased concentration and dysphoric mood. The patient is not nervous/anxious.        Objective:   Physical Exam Constitutional:      General: She is not  in acute distress.    Appearance: Normal appearance. She is well-developed. She is obese. She is not ill-appearing.  HENT:     Head: Normocephalic and atraumatic.     Comments: Nares are injected and congested  Clear pnd   Bilateral frontal sinus tenderness/ less in maxillary area  bilateral    Right Ear: Tympanic membrane and external ear normal.     Left Ear: Tympanic membrane, ear canal and external ear normal.     Nose: Congestion and rhinorrhea present.     Mouth/Throat:     Mouth: Mucous membranes are moist.     Pharynx: No oropharyngeal exudate or posterior oropharyngeal erythema.     Comments: Clear pnd Eyes:     General:        Right eye: No discharge.        Left eye: No discharge.     Conjunctiva/sclera: Conjunctivae normal.     Pupils: Pupils are equal, round, and reactive to light.  Cardiovascular:     Rate and Rhythm: Normal rate and regular rhythm.  Pulmonary:     Effort: Pulmonary effort is normal. No respiratory distress.     Breath sounds: Normal breath sounds. No stridor. No wheezing, rhonchi or rales.     Comments: Good air exch Musculoskeletal:     Cervical back: Normal range of motion and neck supple.     Comments: Left middle finger Swelling around proximal pip joint is improved  Mildly  tender Better range of motion (less discomfort than last exam but not none0   Grip strength is reduced by pain but overall improved   Lymphadenopathy:     Cervical: No cervical adenopathy.  Skin:    General: Skin is warm and dry.     Findings: No rash.  Neurological:     Mental Status: She is alert.     Cranial Nerves: No cranial nerve deficit.  Psychiatric:        Mood and Affect: Mood normal.           Assessment & Plan:   Problem List Items Addressed This Visit       Respiratory   Seasonal allergic rhinitis due to pollen   With symptoms of acute sinusitis today   Will treat that  Continue sinfulair and xyzal  and astelin ns   May consider adding steroid ns if congestion does not improve -per pt may have caused some nosebleed in past       Acute frontal sinusitis   Baseline allergy congestion worse this season Now green nasal d/c for 1-2 weeks / some ear pressure and frontal sinus pain  Some tenderness on exam  Treat with augmentin   Continue allergy medicine- astelin and singulair  an xyzal   If congestion continues may want to consider flonase /steroid ns (though pt notes it made nosebleeds worse recently)       Relevant Medications   amoxicillin -clavulanate (AUGMENTIN ) 875-125 MG tablet     Other   Finger pain, left - Primary   Improved swelling and pain but not resolved  Continues ice and nsaid   Referral made to ortho/hand       Relevant Orders   Ambulatory referral to Orthopedic Surgery

## 2024-08-01 NOTE — Patient Instructions (Addendum)
 I put the referral in for orthopedics (hand specialist)  Please let us  know if you don't hear in 1-2 weeks to set that up (mychart message or call or letter)   Continue the cold compress Continue ibuprofen if it helps   (if not helpful then you can stop it)   Avoid any positions/ activities that hurt   If symptoms worsen before you get to the specialist please let us  know   For sinus infection take the augmentin  as directed Continue allergy medicines If congestion does not improve - later you may want to try flonase  over the counter for a few weeks (but it can increase your chance of nose bleeds)

## 2024-08-01 NOTE — Assessment & Plan Note (Signed)
 Baseline allergy congestion worse this season Now green nasal d/c for 1-2 weeks / some ear pressure and frontal sinus pain  Some tenderness on exam  Treat with augmentin   Continue allergy medicine- astelin and singulair  an xyzal   If congestion continues may want to consider flonase /steroid ns (though pt notes it made nosebleeds worse recently)

## 2024-08-10 DIAGNOSIS — R202 Paresthesia of skin: Secondary | ICD-10-CM | POA: Diagnosis not present

## 2024-08-10 DIAGNOSIS — G479 Sleep disorder, unspecified: Secondary | ICD-10-CM | POA: Diagnosis not present

## 2024-08-10 DIAGNOSIS — R200 Anesthesia of skin: Secondary | ICD-10-CM | POA: Diagnosis not present

## 2024-08-10 DIAGNOSIS — R519 Headache, unspecified: Secondary | ICD-10-CM | POA: Diagnosis not present

## 2024-08-15 ENCOUNTER — Ambulatory Visit: Payer: Self-pay

## 2024-08-15 NOTE — Telephone Encounter (Signed)
 FYI Only or Action Required?: FYI only for provider.  Patient was last seen in primary care on 08/01/2024 by Randeen Laine LABOR, MD.  Called Nurse Triage reporting Bit Lip.  Symptoms began today.  Interventions attempted: Nothing.  Symptoms are: stable.  Triage Disposition: Home Care  Patient/caregiver understands and will follow disposition?:       Copied from CRM #[id number]. Topic: Clinical - Red Word Triage >> Aug 15, 2024 12:46 PM Rea ORN wrote: Red Word that prompted transfer to Nurse Triage: Pt stated she bit bottom lip today and would like to speak to nurse. Pt stated that its not bleeding and but there is a little bit of pain Reason for Disposition  [1] Mild lip swelling from food reaction AND [2] diagnosis already confirmed    Bit own lip  Answer Assessment - Initial Assessment Questions Patient stated she bit outside of bottom lip, no bleeding or swelling. States a little tingly. Advised patient to take some Ibuprofen as direction states and use a cold pack.   1. ONSET: When did the swelling start? (e.g., minutes, hours, days)     No swelling, bit lip today.  2. PAIN: Is the swelling painful to touch? If Yes, ask: How painful is it?   (Scale 1-10; mild, moderate or severe)     5/10  Protocols used: Lip Swelling-A-AH

## 2024-08-15 NOTE — Telephone Encounter (Signed)
Noted. Agree with recommendations.  

## 2024-08-17 ENCOUNTER — Other Ambulatory Visit: Payer: Self-pay | Admitting: Nurse Practitioner

## 2024-08-17 ENCOUNTER — Encounter

## 2024-08-17 DIAGNOSIS — M7989 Other specified soft tissue disorders: Secondary | ICD-10-CM

## 2024-08-17 DIAGNOSIS — J302 Other seasonal allergic rhinitis: Secondary | ICD-10-CM

## 2024-08-17 NOTE — Telephone Encounter (Unsigned)
 Copied from CRM [id number]. Topic: Clinical - Medication Refill >> Aug 17, 2024  9:10 AM Suzen RAMAN wrote: Medication:  vitiaxie 35 mg zinoxime 20 mg venlafaxine (EFFEXOR) 37.5 MG hydrochlorothiazide  (HYDRODIURIL ) 12.5 MG tablet albuterol  (VENTOLIN  HFA) 108 (90 Base) MCG/ACT inhaler Triamcinolone  Acetonide  1 MG   Has the patient contacted their pharmacy? Yes   This is the patient's preferred pharmacy:  [facility] [address], [place] - [address] [place] [address] Phone: [phone] Fax: [phone]  Is this the correct pharmacy for this prescription? Yes If no, delete pharmacy and type the correct one.   Has the prescription been filled recently? No  Is the patient out of the medication? Yes  Has the patient been seen for an appointment in the last year OR does the patient have an upcoming appointment? Yes  Can we respond through MyChart? Yes  Agent: Please be advised that Rx refills may take up to 3 business days. We ask that you follow-up with your pharmacy.

## 2024-08-17 NOTE — Telephone Encounter (Signed)
 Patient also requesting: vitiaxie 35 mg zinoxime 20 mg Triamconolone acetonide 1mg   Medications not on active profile

## 2024-08-18 MED ORDER — ALBUTEROL SULFATE HFA 108 (90 BASE) MCG/ACT IN AERS: INHALATION_SPRAY | RESPIRATORY_TRACT | 1 refills | Status: DC

## 2024-08-18 MED ORDER — HYDROCHLOROTHIAZIDE 12.5 MG PO TABS: 12.5000 mg | ORAL_TABLET | Freq: Every day | ORAL | 1 refills | Status: AC

## 2024-08-22 ENCOUNTER — Telehealth: Payer: Self-pay

## 2024-08-22 NOTE — Telephone Encounter (Addendum)
 Unable to reach patient by phone and left v/m requesting call back at [phone]. I was able to speak with pts husband (DPR signed) and Oneil notified as instructed and Oneil voiced understanding. UC & ED precautions given and Oneil voiced understanding. Oneil said at this time ptis at a friends house and is not in any distress at this time. Sending FYI to Adina Crandall NP.

## 2024-08-22 NOTE — Telephone Encounter (Signed)
 Copied from CRM [id number]. Topic: Clinical - Medical Advice >> Aug 22, 2024  1:54 PM Chiquita SQUIBB wrote: Reason for CRM: Patient is calling in due to being bit by a mosquito yesterday, and states that it is very red. Patient is stating that her finger is also still swollen and she has been putting ice on it since 08/19. Please advise the patient. >> Aug 22, 2024  1:59 PM Chiquita SQUIBB wrote: Patient states she has also been fatigue and having headaches.

## 2024-08-22 NOTE — Telephone Encounter (Signed)
 Copied from CRM [id number]. Topic: Clinical - Medical Advice >> Aug 22, 2024  8:33 AM Anairis L wrote: Reason for CRM: Patient want to know if she should come in, she was bit by a mosquito and she is allergic. She has only a little red spot , no other reaction. Because itchiness.

## 2024-08-22 NOTE — Telephone Encounter (Signed)
 She can use cool compresses and over the counter hydrocortisone cream twice a day for 3 days. See if this helps. Please reviewed ED and UC precautions with patient

## 2024-08-22 NOTE — Telephone Encounter (Signed)
 See other 08/22/24 phn note.

## 2024-08-30 ENCOUNTER — Telehealth: Payer: Self-pay | Admitting: Nurse Practitioner

## 2024-08-30 ENCOUNTER — Encounter: Payer: Self-pay | Admitting: *Deleted

## 2024-08-30 ENCOUNTER — Telehealth: Payer: Self-pay

## 2024-08-30 NOTE — Telephone Encounter (Signed)
 Requested x ray disc of hand for ortho appointment has been made and ready for pick up at the front desk, along with a release form to sign.   Pt notified via voicemail that disc is ready to pick up at her convenience.

## 2024-08-30 NOTE — Telephone Encounter (Signed)
 Copied from CRM [id number]. Topic: Clinical - Request for Lab/Test Order >> Aug 30, 2024  2:33 PM Donna BRAVO wrote: Reason for CRM:   patient stating [facility] is requesting X-r on a disc for left finger and to send it to the [facility] office [facility] Address: [address] [place], [place] [address] Phone: [phone]  Patient has appt on 09/06/24 at 9:00am   Patient would like a call back when disc is ready to pick up

## 2024-09-12 ENCOUNTER — Encounter: Admitting: Internal Medicine

## 2024-09-12 ENCOUNTER — Ambulatory Visit: Payer: Self-pay | Admitting: *Deleted

## 2024-09-12 NOTE — Progress Notes (Signed)
 Unable to reach patient, not seen.

## 2024-09-12 NOTE — Telephone Encounter (Signed)
 FYI Only or Action Required?: FYI only for provider.  Patient was last seen in primary care on 08/01/2024 by Randeen Laine LABOR, MD.  Called Nurse Triage reporting mouth pain and Dental Pain.  Symptoms began several weeks ago.  Interventions attempted: OTC medications: gel.  Symptoms are: gradually worsening.  Triage Disposition: See HCP Within 4 Hours (Or PCP Triage)  Patient/caregiver understands and will follow disposition?: yes       Reason for Disposition . [1] SEVERE pain (e.g., excruciating, unable to eat, unable to do any normal activities) AND [2] not improved 2 hours after pain medicine  Answer Assessment - Initial Assessment Questions 1. LOCATION: Which tooth is hurting?  (e.g., right-side/left-side, upper/lower, front/back)     Lower bottom front 2. ONSET: When did the toothache start?  (e.g., hours, days)      3 weeks 3. SEVERITY: How bad is the toothache?  (Scale 1-10; mild, moderate or severe)     extreme 4. SWELLING: Is there any visible swelling of your face?     no 5. OTHER SYMPTOMS: Do you have any other symptoms? (e.g., fever)     Headaches  Patient is established with dentist- they will see her but they state she has to pay out of pocket. Patient states she has new insurance- she will have that information ready- she may need referral to dentist that take her insurance.  Protocols used: [facility]  Copied from CRM [id number]. Topic: Clinical - Red Word Triage >> Sep 12, 2024  9:42 AM Roselie BROCKS wrote: Red Word that prompted transfer to Nurse Triage: Patient states that her mouth is in extreme pain, can barely talk

## 2024-09-12 NOTE — Telephone Encounter (Signed)
 Noted

## 2024-09-13 ENCOUNTER — Ambulatory Visit: Payer: Self-pay

## 2024-09-13 NOTE — Telephone Encounter (Signed)
 Noted. Will evaluate at visit

## 2024-09-13 NOTE — Telephone Encounter (Signed)
 FYI Only or Action Required?: FYI only for provider.  Patient was last seen in primary care on 09/12/2024 by Bernardo Fend, DO.  Called Nurse Triage reporting Rash.  Symptoms began several weeks ago.  Interventions attempted: Other: husbands foot prescription medication.  Symptoms are: unchanged.  Triage Disposition: See PCP When Office is Open (Within 3 Days)  Patient/caregiver understands and will follow disposition?: Yes    Copied from CRM #[id number]. Topic: Clinical - Red Word Triage >> Sep 13, 2024 10:18 AM Carlyon D wrote: Red Word that prompted transfer to Nurse Triage: Pt under breasts are red and painful/burns, pt also having hot flashes. Reason for Disposition  Mild widespread rash  (Exception: Heat rash lasting 3 days or less.)  Answer Assessment - Initial Assessment Questions Under breast, been using husbands mometasone. Conversation ended up all over the place. She has no way to get to appts because she doesn't drive and her husband can't anymore. Agreed to a virtual. Then said that she lost her phone in her appt and is trying to clean the appt to find the phone but that was 3 days ago. She states she doesn't go anywhere. She states she has a headache which are normal for her. She has an appt for her hand but it's getting better so not sure if she needs it. Pt ended up scheduling virtual and is going to try to find her phone, she said if not she'll call from her husband's phone.      1. APPEARANCE of RASH: What does the rash look like? (e.g., blisters, dry flaky skin, red spots, redness, sores)     Red as fire and burns 2. SIZE: How big are the spots? (e.g., tip of pen, eraser, coin; inches, centimeters)     Completely under both breast 3. LOCATION: Where is the rash located?     Under both breast 4. COLOR: What color is the rash? (Note: It is difficult to assess rash color in people with darker-colored skin. When this situation occurs, simply ask the caller  to describe what they see.)     red 5. ONSET: When did the rash begin?     Noticed it 2 weeks ago. 6. FEVER: Do you have a fever? If Yes, ask: What is your temperature, how was it measured, and when did it start?     no 7. ITCHING: Does the rash itch? If Yes, ask: How bad is the itch? (Scale 1-10; or mild, moderate, severe)     yes 8. CAUSE: What do you think is causing the rash?     unsure 9. MEDICINE FACTORS: Have you started any new medicines within the last 2 weeks? (e.g., antibiotics)      No but started using husbands cream for staff infection 10. OTHER SYMPTOMS: Do you have any other symptoms? (e.g., dizziness, headache, sore throat, joint pain)       headaches  Protocols used: Rash or Redness - [facility]

## 2024-09-14 ENCOUNTER — Encounter: Payer: Self-pay | Admitting: Emergency Medicine

## 2024-09-14 ENCOUNTER — Ambulatory Visit: Admitting: Nurse Practitioner

## 2024-09-14 ENCOUNTER — Telehealth: Payer: Self-pay

## 2024-09-14 ENCOUNTER — Ambulatory Visit: Admission: EM | Admit: 2024-09-14 | Discharge: 2024-09-14 | Disposition: A | Discharge status: Home / Self Care

## 2024-09-14 DIAGNOSIS — K047 Periapical abscess without sinus: Secondary | ICD-10-CM | POA: Diagnosis not present

## 2024-09-14 MED ORDER — DICLOFENAC SODIUM 50 MG PO TBEC: 50.0000 mg | DELAYED_RELEASE_TABLET | Freq: Two times a day (BID) | ORAL | 1 refills | Status: AC

## 2024-09-14 MED ORDER — AMOXICILLIN-POT CLAVULANATE 875-125 MG PO TABS: 1.0000 | ORAL_TABLET | Freq: Two times a day (BID) | ORAL | 0 refills | Status: DC

## 2024-09-14 MED ORDER — AMOXICILLIN-POT CLAVULANATE 875-125 MG PO TABS: 1.0000 | ORAL_TABLET | Freq: Two times a day (BID) | ORAL | 0 refills | Status: AC

## 2024-09-14 MED ORDER — DICLOFENAC SODIUM 50 MG PO TBEC: 50.0000 mg | DELAYED_RELEASE_TABLET | Freq: Two times a day (BID) | ORAL | 1 refills | Status: DC

## 2024-09-14 NOTE — ED Provider Notes (Signed)
 [facility]  Note:  This document was prepared using Dragon voice recognition software and may include unintentional dictation errors.  MRN: [id number] DOB: November 01, 1976  Subjective:   Kathy Gamble is a 48 y.o. female presenting for bilateral ear pain, facial pressure, headaches x 1 month.  Patient was just evaluated by a dentist and informed that she needs 5 root canals due to chronic dental infections.  Patient reports severe photophobia with headaches, throbbing frontal headache of 7/10.  Patient reports she has used Goody powder with minimal improvement to symptoms.  Patient was unsure if symptoms were related to dental infections or if she had sinus infection or other medical condition.  No current facility-administered medications for this encounter.  Current Outpatient Medications:    albuterol  (VENTOLIN  HFA) 108 (90 Base) MCG/ACT inhaler, Inhale 2 puffs into the lungs every 4 (four) hours as needed for wheezing or shortness of breath., Disp: , Rfl:    amoxicillin -clavulanate (AUGMENTIN ) 875-125 MG tablet, Take 1 tablet by mouth 2 (two) times daily for 10 days., Disp: 20 tablet, Rfl: 0   Azelastine HCl 137 MCG/SPRAY SOLN, Place into both nostrils., Disp: , Rfl:    diclofenac (VOLTAREN) 50 MG EC tablet, Take 1 tablet (50 mg total) by mouth 2 (two) times daily., Disp: 30 tablet, Rfl: 1   hydrochlorothiazide  (HYDRODIURIL ) 12.5 MG tablet, Take 1 tablet (12.5 mg total) by mouth daily., Disp: 90 tablet, Rfl: 1   levocetirizine (XYZAL ) 5 MG tablet, Take 1 tablet (5 mg total) by mouth every evening., Disp: 90 tablet, Rfl: 2   losartan  (COZAAR ) 50 MG tablet, TAKE 1 TABLET(50 MG) BY MOUTH DAILY, Disp: 90 tablet, Rfl: 0   montelukast  (SINGULAIR ) 10 MG tablet, TAKE ONE TABLET BY MOUTH ONCE DAILY FOR sinuses, Disp: 90 tablet, Rfl: 3   Multiple Vitamin (MULTIVITAMIN) capsule, Take 1 capsule by mouth daily., Disp: , Rfl:    nortriptyline (PAMELOR) 10 MG capsule, Take 10 mg by  mouth., Disp: , Rfl:    omeprazole  (PRILOSEC) 20 MG capsule, Take 1 capsule (20 mg total) by mouth daily., Disp: 90 capsule, Rfl: 0   UBRELVY 50 MG TABS, Take 50 mg by mouth as directed., Disp: , Rfl:    venlafaxine (EFFEXOR) 37.5 MG tablet, Take 37.5 mg by mouth., Disp: , Rfl:    verapamil (CALAN) 40 MG tablet, Take 1 tablet by mouth 2 (two) times daily., Disp: , Rfl:    gabapentin  (NEURONTIN ) 300 MG capsule, Take 300 mg by mouth as directed. (Patient not taking: Reported on 09/14/2024), Disp: , Rfl:    gabapentin  (NEURONTIN ) 300 MG capsule, Take 300 mg by mouth 3 (three) times daily. (Patient not taking: Reported on 09/14/2024), Disp: , Rfl:    gabapentin  (NEURONTIN ) 400 MG capsule, Take 800 mg by mouth every evening. (Patient not taking: Reported on 09/14/2024), Disp: , Rfl:    hydrOXYzine (ATARAX) 50 MG tablet, daily. (Patient not taking: Reported on 09/14/2024), Disp: , Rfl:    QUEtiapine  (SEROQUEL ) 25 MG tablet, Take 3 tablets (75 mg total) by mouth at bedtime. (Patient not taking: Reported on 09/14/2024), Disp: , Rfl:    tiZANidine  (ZANAFLEX ) 4 MG tablet, Take 1 tablet (4 mg total) by mouth at bedtime as needed for muscle spasms. (Patient not taking: Reported on 09/14/2024), Disp: 14 tablet, Rfl: 0   zonisamide (ZONEGRAN) 100 MG capsule, Take 100 mg by mouth at bedtime. (Patient not taking: Reported on 09/14/2024), Disp: , Rfl:    Allergies  Allergen Reactions  Bee Venom Itching   Dog Epithelium (Canis Lupus Familiaris) Dermatitis   Nortriptyline Other (See Comments)    GI distress  Other Reaction(s): Not available  nortriptyline   Latex Rash and Dermatitis    The powder in the gloves causes rash  latex    Past Medical History:  Diagnosis Date   Allergy    Bee sting allergy 03/02/2015   Chicken pox    Frequent headaches    GERD (gastroesophageal reflux disease)      Past Surgical History:  Procedure Laterality Date   ADENOIDECTOMY     TONSILLECTOMY      Family History   Problem Relation Age of Onset   Stroke Mother    Mental retardation Mother    Cancer Maternal Grandmother    Cancer Paternal Grandmother        Breast   Heart disease Paternal Grandfather     Social History   Tobacco Use   Smoking status: Never   Smokeless tobacco: Never  Vaping Use   Vaping status: Never Used  Substance Use Topics   Alcohol use: No    Alcohol/week: 0.0 standard drinks of alcohol   Drug use: Not Currently    Types: Cocaine    ROS Refer to HPI for ROS details.  Objective:   Vitals: BP (!) 130/91 (BP Location: Left Arm)   Pulse 82   Temp 98 F (36.7 C) (Oral)   Resp 14   LMP  (LMP Unknown)   SpO2 97%   Physical Exam Vitals and nursing note reviewed.  Constitutional:      General: She is not in acute distress.    Appearance: She is well-developed. She is not ill-appearing or toxic-appearing.  HENT:     Head: Normocephalic and atraumatic.     Right Ear: Tympanic membrane, ear canal and external ear normal.     Left Ear: Tympanic membrane, ear canal and external ear normal.     Nose: Nose normal. No nasal tenderness, mucosal edema, congestion or rhinorrhea.     Right Sinus: No maxillary sinus tenderness or frontal sinus tenderness.     Left Sinus: No maxillary sinus tenderness or frontal sinus tenderness.     Mouth/Throat:     Lips: Pink.     Mouth: Mucous membranes are moist.     Dentition: Abnormal dentition. Dental tenderness, gingival swelling, dental caries and dental abscesses present.     Pharynx: Oropharynx is clear.  Eyes:     Extraocular Movements: Extraocular movements intact.     Conjunctiva/sclera: Conjunctivae normal.  Cardiovascular:     Rate and Rhythm: Normal rate.  Pulmonary:     Effort: Pulmonary effort is normal. No respiratory distress.  Musculoskeletal:        General: Normal range of motion.  Skin:    General: Skin is warm and dry.  Neurological:     General: No focal deficit present.     Mental Status: She is  alert and oriented to person, place, and time.  Psychiatric:        Mood and Affect: Mood normal.        Behavior: Behavior normal.     Procedures  No results found for this or any previous visit (from the past 24 hours).  No results found.   Assessment and Plan :     Discharge Instructions       1. Chronic dental infection (Primary) - diclofenac (VOLTAREN) 50 MG EC tablet; Take 1 tablet (50 mg total)  by mouth 2 (two) times daily.  Dispense: 30 tablet; Refill: 1 - amoxicillin -clavulanate (AUGMENTIN ) 875-125 MG tablet; Take 1 tablet by mouth 2 (two) times daily for 10 days.  Dispense: 20 tablet; Refill: 0 - Follow-up with dentist as soon as possible for further evaluation and treatment. -Continue to monitor symptoms for any change in severity if there is any escalation of current symptoms or development of new symptoms follow-up in ER for further evaluation and management.      Yahsir Wickens B Jermine Bibbee   Katalyn Matin, [place] B, NP 09/14/24 1350

## 2024-09-14 NOTE — Telephone Encounter (Signed)
 Patient requested Rx's resent to [facility] here near Urgent Care.   [place]. M. CMA

## 2024-09-14 NOTE — ED Triage Notes (Addendum)
 Pt reports bilateral ear pressure, facial pressure, and headaches x 1 month. Pt reports currently needing 5 root canals and is unsure if pain is related to that or is something separate. Notes photosensitivity with headaches. Describes throbbing headaches 7/10. Pt has used goody's powder with no relief.

## 2024-09-14 NOTE — Discharge Instructions (Addendum)
  1. Chronic dental infection (Primary) - diclofenac (VOLTAREN) 50 MG EC tablet; Take 1 tablet (50 mg total) by mouth 2 (two) times daily.  Dispense: 30 tablet; Refill: 1 - amoxicillin -clavulanate (AUGMENTIN ) 875-125 MG tablet; Take 1 tablet by mouth 2 (two) times daily for 10 days.  Dispense: 20 tablet; Refill: 0 - Follow-up with dentist as soon as possible for further evaluation and treatment. -Continue to monitor symptoms for any change in severity if there is any escalation of current symptoms or development of new symptoms follow-up in ER for further evaluation and management.

## 2024-09-21 ENCOUNTER — Telehealth: Payer: Self-pay

## 2024-09-21 NOTE — Telephone Encounter (Signed)
Mail box full not able to leave message at this time.

## 2024-09-21 NOTE — Telephone Encounter (Signed)
 Copied from CRM [id number]. Topic: Clinical - Medication Question >> Sep 20, 2024  1:52 PM Alfonso HERO wrote: Reason for CRM: Patient wants to know if she is supposed be taking   gabapentin  (NEURONTIN ) 300 MG capsule gabapentin  (NEURONTIN ) 400 MG capsule  If she is she would like someone to call her because she seems to be a little confused.Call her husbands phone [phone] because she lost hers.

## 2024-09-22 NOTE — Telephone Encounter (Signed)
 Left detailed message for patient to call back if there are any questions or concerns.

## 2024-09-24 ENCOUNTER — Other Ambulatory Visit: Payer: Self-pay | Admitting: Nurse Practitioner

## 2024-10-03 ENCOUNTER — Other Ambulatory Visit: Payer: Self-pay | Admitting: Nurse Practitioner

## 2024-10-06 ENCOUNTER — Other Ambulatory Visit: Payer: Self-pay | Admitting: *Deleted

## 2024-10-06 MED ORDER — OMEPRAZOLE 20 MG PO CPDR: 20.0000 mg | DELAYED_RELEASE_CAPSULE | Freq: Every day | ORAL | 0 refills | Status: DC

## 2024-10-06 NOTE — Telephone Encounter (Signed)
 Copied from CRM [id number]. Topic: Clinical - Medication Question >> Oct 06, 2024 10:06 AM Burnard DEL wrote: Reason for CRM: Patient would like to have prescription omeprazole  (PRILOSEC) 20 MG capsule  resent to    [facility] [address], [place] - [address]  Phone: [phone] Fax: [phone]

## 2024-10-26 ENCOUNTER — Other Ambulatory Visit: Payer: Self-pay

## 2024-10-26 ENCOUNTER — Encounter (HOSPITAL_COMMUNITY): Payer: Self-pay | Admitting: *Deleted

## 2024-10-26 ENCOUNTER — Telehealth: Payer: Self-pay

## 2024-10-26 ENCOUNTER — Emergency Department (HOSPITAL_COMMUNITY)
Admission: EM | Admit: 2024-10-26 | Discharge: 2024-10-27 | Disposition: A | Discharge status: Home / Self Care | Attending: Emergency Medicine | Admitting: Emergency Medicine

## 2024-10-26 ENCOUNTER — Emergency Department (HOSPITAL_COMMUNITY)

## 2024-10-26 DIAGNOSIS — Z59868 Other specified financial insecurity: Secondary | ICD-10-CM | POA: Diagnosis not present

## 2024-10-26 DIAGNOSIS — R4182 Altered mental status, unspecified: Secondary | ICD-10-CM | POA: Insufficient documentation

## 2024-10-26 DIAGNOSIS — R9431 Abnormal electrocardiogram [ECG] [EKG]: Secondary | ICD-10-CM | POA: Insufficient documentation

## 2024-10-26 LAB — CBC WITH DIFFERENTIAL/PLATELET
Abs Immature Granulocytes: 0.03 K/uL (ref 0.00–0.07)
Basophils Absolute: 0 K/uL (ref 0.0–0.1)
Basophils Relative: 0 %
Eosinophils Absolute: 0.1 K/uL (ref 0.0–0.5)
Eosinophils Relative: 1 %
HCT: 41.4 % (ref 36.0–46.0)
Hemoglobin: 14.2 g/dL (ref 12.0–15.0)
Immature Granulocytes: 0 %
Lymphocytes Relative: 31 %
Lymphs Abs: 2.6 K/uL (ref 0.7–4.0)
MCH: 29.6 pg (ref 26.0–34.0)
MCHC: 34.3 g/dL (ref 30.0–36.0)
MCV: 86.4 fL (ref 80.0–100.0)
Monocytes Absolute: 0.8 K/uL (ref 0.1–1.0)
Monocytes Relative: 9 %
Neutro Abs: 4.9 K/uL (ref 1.7–7.7)
Neutrophils Relative %: 59 %
Platelets: 344 K/uL (ref 150–400)
RBC: 4.79 MIL/uL (ref 3.87–5.11)
RDW: 13.6 % (ref 11.5–15.5)
WBC: 8.3 K/uL (ref 4.0–10.5)
nRBC: 0 % (ref 0.0–0.2)

## 2024-10-26 NOTE — Telephone Encounter (Signed)
 They are welcome to schedule an appointment with me to discuss. She sees me and neurology  I only write her two blood pressure medications (hydrochlorothiazide  and losartan ) Heartburn medications (omeprazole ) Ans allergy pill ( levocetirizine)  The rest of her medications are managed by other providers

## 2024-10-26 NOTE — ED Provider Triage Note (Signed)
 Emergency Medicine Provider Triage Evaluation Note  Kathy Gamble , a 48 y.o. female  was evaluated in triage.  Pt complains of worsening cognitive function per father.  Patient has history of learning delay and is on several medications by PCP and neurology.  Father reports she has a lot of stress at home taking care of her child with special needs and her husband who is on disability.  She has been staying at his house for the last 3 days and reports she does odd things that are a little bit out of her normal.  He reports that she acts confused sometimes and does things opposite of what you tell her.  He reports this has been going on for quite a while but thinks it is getting worse over the last couple days.  He does report she had some teeth pulled on Tuesday and thinks this may be related.  Review of Systems  Positive: Increased confusion Negative: Chest pain, shortness of breath, abdominal pain, dizziness, weakness,  Physical Exam  BP (!) 131/109 (BP Location: Right Arm)   Pulse (!) 102   Temp 98.2 F (36.8 C) (Oral)   Resp 18   LMP  (LMP Unknown)   SpO2 96%  Gen:   Awake, no distress   Resp:  Normal effort clear to auscultation all fields MSK:   Moves extremities without difficulty  Other:  Neuroexam is unremarkable, there is no pain to palpation throughout abdomen.  Patient denies any associated symptoms,  Medical Decision Making  Medically screening exam initiated at 11:36 PM.  Appropriate orders placed.  [name] was informed that the remainder of the evaluation will be completed by another provider, this initial triage assessment does not replace that evaluation, and the importance of remaining in the ED until their evaluation is complete.  Patient denies any SI or HI.  Patient is on multiple medications by PCP and neurology.  Father is requesting workup because she is having increased decline cognitive function.   Kathy Gamble, [place] 10/26/24 2341

## 2024-10-26 NOTE — ED Triage Notes (Signed)
 Had a tooth pulled on Tuesday, had fever at that time that resolve. Perfather at bedside, pt has always had difficulty naming day of the week and is slower to respond to some questions. Father noticed today that pt has had Decreased comprehension, slower to respond, and confusion. Pt is alert to self and place

## 2024-10-26 NOTE — Telephone Encounter (Signed)
 Copied from CRM #[id number]. Topic: Clinical - Medication Question >> Oct 26, 2024 11:03 AM Donna BRAVO wrote: Reason for CRM: patient mother Susie calling who is not on DPR, Malillany gave permission for me to speak with Susie.  Susie has questions about why Lynwood HERO. Cable NP has prescribed soo many medications. Susie has a lot of questions about patients medications.

## 2024-10-26 NOTE — Telephone Encounter (Signed)
 Called and spoke with mark on dpr.  Informed reason for medications prescribed by PCP.  Oneil states that he will relay the information to pt.  No further questions or concerns at this time.

## 2024-10-27 ENCOUNTER — Emergency Department (HOSPITAL_COMMUNITY)

## 2024-10-27 ENCOUNTER — Encounter (HOSPITAL_COMMUNITY): Payer: Self-pay

## 2024-10-27 LAB — COMPREHENSIVE METABOLIC PANEL WITH GFR
ALT: 22 U/L (ref 0–44)
AST: 23 U/L (ref 15–41)
Albumin: 4 g/dL (ref 3.5–5.0)
Alkaline Phosphatase: 186 U/L — ABNORMAL HIGH (ref 38–126)
Anion gap: 15 (ref 5–15)
BUN: 14 mg/dL (ref 6–20)
CO2: 21 mmol/L — ABNORMAL LOW (ref 22–32)
Calcium: 9.2 mg/dL (ref 8.9–10.3)
Chloride: 101 mmol/L (ref 98–111)
Creatinine, Ser: 0.81 mg/dL (ref 0.44–1.00)
GFR, Estimated: >60 mL/min (ref >60)
Glucose, Bld: 105 mg/dL — ABNORMAL HIGH (ref 70–99)
Potassium: 3.5 mmol/L (ref 3.5–5.1)
Sodium: 137 mmol/L (ref 135–145)
Total Bilirubin: 0.7 mg/dL (ref 0.0–1.2)
Total Protein: 7.3 g/dL (ref 6.5–8.1)

## 2024-10-27 LAB — URINALYSIS, ROUTINE W REFLEX MICROSCOPIC
Bilirubin Urine: NEGATIVE
Glucose, UA: NEGATIVE mg/dL
Hgb urine dipstick: NEGATIVE
Ketones, ur: 20 mg/dL — AB
Leukocytes,Ua: NEGATIVE
Nitrite: NEGATIVE
Protein, ur: NEGATIVE mg/dL
Specific Gravity, Urine: 1.017 (ref 1.005–1.030)
pH: 5 (ref 5.0–8.0)

## 2024-10-27 LAB — PROTIME-INR
INR: 1.1 (ref 0.8–1.2)
Prothrombin Time: 15 s (ref 11.4–15.2)

## 2024-10-27 LAB — MAGNESIUM: Magnesium: 2 mg/dL (ref 1.7–2.4)

## 2024-10-27 LAB — TSH: TSH: 3.02 u[IU]/mL (ref 0.350–4.500)

## 2024-10-27 MED ORDER — LORAZEPAM 2 MG/ML IJ SOLN: 2.0000 mg | INTRAMUSCULAR | Status: DC | PRN

## 2024-10-27 NOTE — Discharge Instructions (Signed)
 Your workup here looks reassuring.  Please call your family doctor and let them know about this visit.  See when they can see you again in the office.  Please return for worsening symptoms if you feel like it is unsafe at home.

## 2024-10-27 NOTE — ED Notes (Signed)
 Patient transported to MRI

## 2024-10-27 NOTE — ED Notes (Signed)
 The blue top for Protime-INR has been redrawn and sent down to main lab.

## 2024-10-27 NOTE — ED Notes (Signed)
 In and Out performed 850 mL urine collected

## 2024-10-27 NOTE — ED Notes (Signed)
 Writer has asked pt multiple times for a urine sample. Pt stated that she is fine and she can leave us  one. Water was given next time pt stated that she will not leave us  a urine sample. RN and MD notified.

## 2024-10-27 NOTE — ED Provider Notes (Signed)
 Received patient in turnover from Dr. Theadore.  Please see their note for further details of Hx, PE.  Briefly patient is a 48 y.o. female with a Altered Mental Status .  Patient with progressive change in functional status.  Plan for UA.  UA is negative for infection.  I discussed the results with the patient.  She is anxious to be home with her child.  Will discharge.  PCP follow-up.    Emil Share, DO 10/27/24 [phone]

## 2024-10-27 NOTE — ED Provider Notes (Signed)
 [facility] Emergency Department Provider Note MRN:  [id number]  Arrival date & time: 10/27/24     Chief Complaint   Altered Mental Status   History of Present Illness   Kathy Gamble is a 48 y.o. year-old female with no pertinent past medical history presenting to the ED with chief complaint of altered mental status.  Progressively worsening mental status over the past few weeks.  Getting more more confused, more recently having trouble following commands, trouble remembering things, at times not making sense.  No slurred speech.  Needing assistance to dress herself over the past few days.  Other than that patient denies any pain, no fever, no other symptoms.  Under a lot of stress recently, has a high needs autistic child that she cares for as well as an elderly spouse that is in poor health.  Review of Systems  A thorough review of systems was obtained and all systems are negative except as noted in the HPI and PMH.   Patient's Health History    Past Medical History:  Diagnosis Date   Allergy    Bee sting allergy 03/02/2015   Chicken pox    Frequent headaches    GERD (gastroesophageal reflux disease)     Past Surgical History:  Procedure Laterality Date   ADENOIDECTOMY     TONSILLECTOMY      Family History  Problem Relation Age of Onset   Stroke Mother    Mental retardation Mother    Cancer Maternal Grandmother    Cancer Paternal Grandmother        Breast   Heart disease Paternal Grandfather     Social History   Socioeconomic History   Marital status: Married    Spouse name: Not on file   Number of children: Not on file   Years of education: Not on file   Highest education level: Not on file  Occupational History   Occupation: disabled  Tobacco Use   Smoking status: Never   Smokeless tobacco: Never  Vaping Use   Vaping status: Never Used  Substance and Sexual Activity   Alcohol use: No    Alcohol/week: 0.0 standard drinks of  alcohol   Drug use: Not Currently    Types: Cocaine   Sexual activity: Yes    Birth control/protection: None  Other Topics Concern   Not on file  Social History Narrative   Cognitive dysfunction - lives with her husband   Social Drivers of Corporate Investment Banker Strain: Medium Risk (05/24/2024)   Received from [facility]   Overall Financial Resource Strain (CARDIA)    Difficulty of Paying Living Expenses: Somewhat hard  Food Insecurity: Food Insecurity Present (05/24/2024)   Received from [facility]   Hunger Vital Sign    Within the past 12 months, you worried that your food would run out before you got the money to buy more.: Sometimes true    Within the past 12 months, the food you bought just didn't last and you didn't have money to get more.: Sometimes true  Transportation Needs: No Transportation Needs (05/24/2024)   Received from [facility] - Transportation    In the past 12 months, has lack of transportation kept you from medical appointments or from getting medications?: No    Lack of Transportation (Non-Medical): No  Physical Activity: Insufficiently Active (04/24/2024)   Exercise Vital Sign    Days of Exercise per Week: 2  days    Minutes of Exercise per Session: 30 min  Stress: No Stress Concern Present (04/24/2024)   [employer] of Occupational Health - Occupational Stress Questionnaire    Feeling of Stress : Not at all  Social Connections: Moderately Integrated (04/24/2024)   Social Connection and Isolation Panel    Frequency of Communication with Friends and Family: More than three times a week    Frequency of Social Gatherings with Friends and Family: More than three times a week    Attends Religious Services: More than 4 times per year    Active Member of [employer] or Organizations: No    Attends Banker Meetings: Never    Marital Status: Married  Catering Manager Violence: Not At  Risk (04/24/2024)   Humiliation, Afraid, Rape, and Kick questionnaire    Fear of Current or Ex-Partner: No    Emotionally Abused: No    Physically Abused: No    Sexually Abused: No     Physical Exam   Vitals:   10/27/24 0207 10/27/24 0300  BP: (!) 145/112 (!) 133/94  Pulse: 87 88  Resp: 16 19  Temp:    SpO2: 95% 99%    CONSTITUTIONAL: Well-appearing, NAD NEURO/PSYCH: Awake and alert, oriented to name only, moves all extremities equally EYES:  eyes equal and reactive ENT/NECK:  no LAD, no JVD CARDIO: Regular rate, well-perfused, normal S1 and S2 PULM:  CTAB no wheezing or rhonchi GI/GU:  non-distended, non-tender MSK/SPINE:  No gross deformities, no edema SKIN:  no rash, atraumatic   *Additional and/or pertinent findings included in MDM below  Diagnostic and Interventional Summary    EKG Interpretation Date/Time:  Friday October 27 2024 02:20:26 EST Ventricular Rate:  89 PR Interval:  187 QRS Duration:  91 QT Interval:  408 QTC Calculation: 497 R Axis:   0  Text Interpretation: Sinus tachycardia Ventricular premature complex Borderline prolonged QT interval Confirmed by Theadore Sharper [phone]) on 10/27/2024 3:42:42 AM       Labs Reviewed  COMPREHENSIVE METABOLIC PANEL WITH GFR - Abnormal; Notable for the following components:      Result Value   CO2 21 (*)    Glucose, Bld 105 (*)    Alkaline Phosphatase 186 (*)    All other components within normal limits  CBC WITH DIFFERENTIAL/PLATELET  TSH  MAGNESIUM  PROTIME-INR  URINALYSIS, ROUTINE W REFLEX MICROSCOPIC    DG Chest Port 1 View  Final Result    CT Head Wo Contrast  Final Result    MR BRAIN WO CONTRAST    (Results Pending)    Medications - No data to display   Procedures  /  Critical Care Procedures  ED Course and Medical Decision Making  Initial Impression and Ddx Progressive functional decline over the past few weeks, could be stress related, could be polypharmacy as she is taking several CNS  acting agents.  Other considerations would include CNS mass, stroke, electrolyte disturbance, UTI.  Parents at bedside report that she has a mild cognitive delay from birth, never really knows the year month or day and so baseline for her is to be not oriented to time.  Past medical/surgical history that increases complexity of ED encounter: None  Interpretation of Diagnostics I personally reviewed the Chest Xray and my interpretation is as follows: No pneumothorax No significant blood count or electrolyte disturbance. CT head unremarkable  Patient Reassessment and Ultimate Disposition/Management     Plan is for MRI and follow-up urinalysis to further evaluate  for reasons for her continued progressively worsening confusion and functional status.  Signed out to oncoming provider at shift change.  Patient management required discussion with the following services or consulting groups:  None  Complexity of Problems Addressed Acute illness or injury that poses threat of life of bodily function  Additional Data Reviewed and Analyzed Further history obtained from: Further history from spouse/family member  Additional Factors Impacting ED Encounter Risk Consideration of hospitalization  Ozell HERO. Theadore, MD [facility] Emergency Medicine [facility] [email or web address]  Final Clinical Impressions(s) / ED Diagnoses     ICD-10-CM   1. Altered mental status, unspecified altered mental status type  R41.82       ED Discharge Orders     None        Discharge Instructions Discussed with and Provided to Patient:   Discharge Instructions   None      Theadore Ozell HERO, MD 10/27/24 [phone]

## 2024-11-02 ENCOUNTER — Ambulatory Visit: Payer: Self-pay

## 2024-11-02 NOTE — Telephone Encounter (Signed)
 Left message for patient to call back at [phone]   Message from Franky GRADE sent at 11/02/2024  3:53 PM EST  Reason for Triage: Patient is calling with symptoms of a possible ear/sinus infection that started yesterday and have progressed today.

## 2024-11-02 NOTE — Telephone Encounter (Signed)
 FYI Only or Action Required?: FYI only for provider: appointment scheduled on 11/03/2024.  Patient was last seen in primary care on 09/12/2024 by Bernardo Fend, DO.  Called Nurse Triage reporting Otalgia and Facial Pain.  Symptoms began several days ago.  Interventions attempted: Nothing.  Symptoms are: unchanged.  Triage Disposition: See PCP When Office is Open (Within 3 Days)  Patient/caregiver understands and will follow disposition?:    Message from Franky GRADE sent at 11/02/2024  3:53 PM EST  Reason for Triage: Patient is calling with symptoms of a possible ear/sinus infection that started yesterday and have progressed today.   Reason for Disposition  [1] Sinus congestion (pressure, fullness) AND [2] present > 10 days  Answer Assessment - Initial Assessment Questions 1. LOCATION: Where does it hurt?      Ears 2. ONSET: When did the sinus pain start?  (e.g., hours, days)      yesterday 3. SEVERITY: How bad is the pain?   (Scale 0-10; or none, mild, moderate or severe)     7 4. RECURRENT SYMPTOM: Have you ever had sinus problems before? If Yes, ask: When was the last time? and What happened that time?      Doesn't think so  5. NASAL CONGESTION: Is the nose blocked? If Yes, ask: Can you open it or must you breathe through your mouth?     no 6. NASAL DISCHARGE: Do you have discharge from your nose? If so ask, What color?     yellow 7. FEVER: Do you have a fever? If Yes, ask: What is it, how was it measured, and when did it start?      no 8. OTHER SYMPTOMS: Do you have any other symptoms? (e.g., sore throat, cough, earache, difficulty breathing)     Headache, earache, coughing non productive  Protocols used: Sinus Pain or Congestion-A-AH

## 2024-11-03 ENCOUNTER — Ambulatory Visit (INDEPENDENT_AMBULATORY_CARE_PROVIDER_SITE_OTHER): Admitting: Nurse Practitioner

## 2024-11-03 ENCOUNTER — Ambulatory Visit: Admitting: Family Medicine

## 2024-11-15 ENCOUNTER — Ambulatory Visit: Admitting: Family

## 2024-11-17 ENCOUNTER — Ambulatory Visit: Payer: Self-pay

## 2024-11-17 ENCOUNTER — Ambulatory Visit: Admitting: Nurse Practitioner

## 2024-11-17 NOTE — Telephone Encounter (Signed)
 FYI Only or Action Required?: Action required by provider: request for appointment and clinical question for provider. Requesting work in next week with pcp for chronic condition follow up and new med request  Patient was last seen in primary care on 09/12/2024 by Bernardo Fend, DO.  Called Nurse Triage reporting Headache.  Symptoms began several weeks ago.  Interventions attempted: Rest, hydration, or home remedies.  Symptoms are: unchanged.  Triage Disposition: See Physician Within 24 Hours  Patient/caregiver understands and will follow disposition?: Unsure   Copied from CRM #[id number]. Topic: Clinical - Red Word Triage >> Nov 17, 2024  8:46 AM Treva T wrote: [facility] that prompted transfer to Nurse Triage: Pt spouse calling, Oneil, states pt has an appt today for UTI  and headaches, however he is not able to bring her to appt as he is a shaky driver and due to weather.   Pt is still having symptoms of headaches, numbness all over body, especially while sleeping, frequency and painful with urination, and loss of bladder control. Thinks she may have an infection or UTI.  Requesting to reschedule appt for today for evaluation. Reason for Disposition  [1] MODERATE headache (e.g., interferes with normal activities) AND [2] present > 24 hours AND [3] unexplained  (Exceptions: Pain medicines not tried, typical migraine, or headache part of viral illness.)  Answer Assessment - Initial Assessment Questions Patient had in person visit scheduled today for evaluation of headache and dysuria, patient reports her dysuria has resolved but dealing with constant chronic headache. She would also like to discuss weight loss medication Zepbound. She is unable to attend in person visit today due to weather, offered to change appointment to video visit which she accepts, this writer changed appointment to video and then patient let this writer know she does not have a way to access video visit today due to  one device and child has remote learning, she then mentioned she also cannot access due to password, phone number for reset phone is missing and cannot access, she also cannot recall her password for email on file, offered MyChart helpline phone number but they declined offer due to not having access to phone until after noon. Spouse and patient asking to reschedule next week. PCP next available on next week is not until  11/24/24, requesting work in visit. Advised message will be sent to provider for review and follow up.     1. LOCATION: Where does it hurt?      All over 2. ONSET: When did the headache start? (e.g., minutes, hours, days)      chronic 3. PATTERN: Does the pain come and go, or has it been constant since it started?     constant 4. SEVERITY: How bad is the pain? and What does it keep you from doing?  (e.g., Scale 1-10; mild, moderate, or severe)     varies 5. RECURRENT SYMPTOM: Have you ever had headaches before? If Yes, ask: When was the last time? and What happened that time?      yes 6. CAUSE: What do you think is causing the headache?     unsure 7. MIGRAINE: Have you been diagnosed with migraine headaches? If Yes, ask: Is this headache similar?       8. HEAD INJURY: Has there been any recent injury to your head?      Denies  9. OTHER SYMPTOMS: Do you have any other symptoms? (e.g., fever, stiff neck, eye pain, sore throat, cold symptoms)  Had dysuria now resolved.  10. PREGNANCY: Is there any chance you are pregnant? When was your last menstrual period?  Protocols used: [facility]

## 2024-11-17 NOTE — Telephone Encounter (Signed)
 Can we get patient rescheduled please

## 2024-11-28 ENCOUNTER — Ambulatory Visit: Payer: Self-pay

## 2024-11-28 NOTE — Telephone Encounter (Signed)
 FYI Only or Action Required?: FYI only for provider: appointment scheduled on 11/29/24.  Patient was last seen in primary care on 09/12/2024 by Bernardo Fend, DO.  Called Nurse Triage reporting Headache and Nasal Congestion.  Symptoms began several weeks ago.  Interventions attempted: Prescription medications: xyzal .  Symptoms are: unchanged.  Triage Disposition: See Physician Within 24 Hours  Patient/caregiver understands and will follow disposition?: Yes   Copied from CRM [id number]. Topic: Clinical - Red Word Triage >> Nov 28, 2024 10:53 AM Mesmerise C wrote: [facility] that prompted transfer to Nurse Triage: Patient's ears are hurting along with headaches been ongoing for a week or two and has been spitting up yellow mucus states she lost her levocetirizine (XYZAL ) 5 MG tablet and hasn't been able to find it to take it, lost a tooth states been painful as well has been doing saltwater Reason for Disposition  [1] MODERATE headache (e.g., interferes with normal activities) AND [2] present > 24 hours AND [3] unexplained  (Exceptions: Pain medicines not tried, typical migraine, or headache part of viral illness.)  Answer Assessment - Initial Assessment Questions Pt called in stating she has been experiencing increased h/a and ear pain with congestion x1-2 weeks. Pt states she was taking Xyzal  but cannot find rx. Pt states mucous is yellow; denies fever, SOB or chest pain. States that h/a are more aura migraines where she sees lights. Pt states she has not tried anything further at this time. Discussed based on symptoms and timeline, advised appt. Appt scheduled 12/17. Appointment scheduled for evaluation. Patient agrees with plan of care, and will call back if anything changes, or if symptoms worsen.     1. LOCATION: Where does it hurt?      H/a, behind ears and posterior nec;   2. ONSET: When did the headache start? (e.g., minutes, hours, days)      1-2 weeks ago   3. PATTERN:  Does the pain come and go, or has it been constant since it started?     Comes and goes   4. SEVERITY: How bad is the pain? and What does it keep you from doing?  (e.g., Scale 1-10; mild, moderate, or severe)     6-7  5. RECURRENT SYMPTOM: Have you ever had headaches before? If Yes, ask: When was the last time? and What happened that time?      Pt reports having hx of aura migraines   6. CAUSE: What do you think is causing the headache?     Unknown; pt states she had a tooth pulled and is having congestion, unsure if related  9. OTHER SYMPTOMS: Do you have any other symptoms? (e.g., fever, stiff neck, eye pain, sore throat, cold symptoms)     Ear pain, h/a, congestion with yellow mucous  Protocols used: Headache-A-AH

## 2024-11-28 NOTE — Telephone Encounter (Signed)
 Noted

## 2024-11-29 ENCOUNTER — Encounter: Payer: Self-pay | Admitting: Primary Care

## 2024-11-29 ENCOUNTER — Ambulatory Visit: Admitting: Primary Care

## 2024-11-29 VITALS — BP 122/84 | HR 86 | Temp 98.0°F | Ht 70.0 in | Wt 276.5 lb

## 2024-11-29 DIAGNOSIS — J0110 Acute frontal sinusitis, unspecified: Secondary | ICD-10-CM

## 2024-11-29 MED ORDER — AMOXICILLIN-POT CLAVULANATE 875-125 MG PO TABS: 1.0000 | ORAL_TABLET | Freq: Two times a day (BID) | ORAL | 0 refills | Status: DC

## 2024-11-29 NOTE — Assessment & Plan Note (Signed)
 Give HPI, it is reasonable to assume bacterial involvement at this point.  Start Augmentin  antibiotics. Take 1 tablet by mouth twice daily for 7 days. Discussed use of Flonase , and antihistamines.   Follow up PRN

## 2024-11-29 NOTE — Progress Notes (Signed)
 Subjective:    Patient ID: Kathy Gamble, female    DOB: Jun 27, 1976, 48 y.o.   MRN: [id number]  KRIZIA FLIGHT is a very pleasant 48 y.o. female patient of Matt, NP with a history of migraines, allergic rhinitis, asthma, GERD, who presents today to discuss sinus congestion.  Symptom onset approximately 2 weeks ago with a headache to the left frontal lobe. She then developed sinus pressure to the frontal lobe and behind both ears, chills. She denies fevers, body aches.  She is seeing thick yellow mucus from her nasal cavity.  She has a history of sinusitis, and her current symptoms feel similar. She's not taken anything OTC, just her typical medications.    Review of Systems  Constitutional:  Positive for chills. Negative for fatigue and fever.  HENT:  Positive for congestion, sinus pressure and sinus pain.   Respiratory:  Negative for cough.   Neurological:  Positive for headaches.         Past Medical History:  Diagnosis Date   Allergy    Bee sting allergy 03/02/2015   Chicken pox    Frequent headaches    GERD (gastroesophageal reflux disease)     Social History   Socioeconomic History   Marital status: Married    Spouse name: Not on file   Number of children: Not on file   Years of education: Not on file   Highest education level: Not on file  Occupational History   Occupation: disabled  Tobacco Use   Smoking status: Never   Smokeless tobacco: Never  Vaping Use   Vaping status: Never Used  Substance and Sexual Activity   Alcohol use: No    Alcohol/week: 0.0 standard drinks of alcohol   Drug use: Not Currently    Types: Cocaine   Sexual activity: Yes    Birth control/protection: None  Other Topics Concern   Not on file  Social History Narrative   Cognitive dysfunction - lives with her husband   Social Drivers of Health   Tobacco Use: Low Risk (11/29/2024)   Patient History    Smoking Tobacco Use: Never    Smokeless Tobacco Use: Never    Passive Exposure:  Not on file  Financial Resource Strain: Medium Risk (05/24/2024)   Received from [facility]   Overall Financial Resource Strain (CARDIA)    Difficulty of Paying Living Expenses: Somewhat hard  Food Insecurity: Food Insecurity Present (05/24/2024)   Received from [facility]   Epic    Within the past 12 months, you worried that your food would run out before you got the money to buy more.: Sometimes true    Within the past 12 months, the food you bought just didn't last and you didn't have money to get more.: Sometimes true  Transportation Needs: No Transportation Needs (05/24/2024)   Received from [facility] - Transportation    In the past 12 months, has lack of transportation kept you from medical appointments or from getting medications?: No    Lack of Transportation (Non-Medical): No  Physical Activity: Insufficiently Active (04/24/2024)   Exercise Vital Sign    Days of Exercise per Week: 2 days    Minutes of Exercise per Session: 30 min  Stress: No Stress Concern Present (04/24/2024)   [employer] of Occupational Health - Occupational Stress Questionnaire    Feeling of Stress : Not at all  Social Connections: Moderately Integrated (04/24/2024)   Social  Connection and Isolation Panel    Frequency of Communication with Friends and Family: More than three times a week    Frequency of Social Gatherings with Friends and Family: More than three times a week    Attends Religious Services: More than 4 times per year    Active Member of [employer] or Organizations: No    Attends Banker Meetings: Never    Marital Status: Married  Catering Manager Violence: Not At Risk (04/24/2024)   Humiliation, Afraid, Rape, and Kick questionnaire    Fear of Current or Ex-Partner: No    Emotionally Abused: No    Physically Abused: No    Sexually Abused: No  Depression (PHQ2-9): High Risk (08/01/2024)   Depression (PHQ2-9)     PHQ-2 Score: 13  Alcohol Screen: Low Risk (04/24/2024)   Alcohol Screen    Last Alcohol Screening Score (AUDIT): 0  Housing: High Risk (05/24/2024)   Received from [facility]   Epic    In the last 12 months, was there a time when you were not able to pay the mortgage or rent on time?: Yes    In the past 12 months, how many times have you moved where you were living?: 0    At any time in the past 12 months, were you homeless or living in a shelter (including now)?: No  Utilities: At Risk (05/24/2024)   Received from [facility]   Epic    In the past 12 months has the electric, gas, oil, or water company threatened to shut off services in your home?: Yes  Health Literacy: Adequate Health Literacy (04/24/2024)   B1300 Health Literacy    Frequency of need for help with medical instructions: Never    Past Surgical History:  Procedure Laterality Date   ADENOIDECTOMY     TONSILLECTOMY      Family History  Problem Relation Age of Onset   Stroke Mother    Mental retardation Mother    Cancer Maternal Grandmother    Cancer Paternal Grandmother        Breast   Heart disease Paternal Grandfather     Allergies[1]  Medications Ordered Prior to Encounter[2]  BP 122/84   Pulse 86   Temp 98 F (36.7 C) (Oral)   Ht 5' 10 (1.778 m)   Wt 276 lb 8 oz (125.4 kg)   LMP  (LMP Unknown)   SpO2 99%   BMI 39.67 kg/m  Objective:   Physical Exam HENT:     Right Ear: Tympanic membrane and ear canal normal.     Left Ear: Tympanic membrane and ear canal normal.     Mouth/Throat:     Mouth: Mucous membranes are moist.  Cardiovascular:     Rate and Rhythm: Normal rate and regular rhythm.  Pulmonary:     Effort: Pulmonary effort is normal.     Breath sounds: Normal breath sounds.  Musculoskeletal:     Cervical back: Neck supple.  Skin:    General: Skin is warm and dry.  Neurological:     Mental Status: She is alert and oriented to person, place, and  time.  Psychiatric:        Mood and Affect: Mood normal.     Physical Exam        Assessment & Plan:  Acute non-recurrent frontal sinusitis Assessment & Plan: Give HPI, it is reasonable to assume bacterial involvement at this point.  Start Augmentin  antibiotics. Take 1  tablet by mouth twice daily for 7 days. Discussed use of Flonase , and antihistamines.   Follow up PRN  Orders: -     Amoxicillin -Pot Clavulanate; Take 1 tablet by mouth 2 (two) times daily.  Dispense: 14 tablet; Refill: 0    Assessment and Plan Assessment & Plan         Comer MARLA Gaskins, NP       [1]  Allergies Allergen Reactions   Bee Venom Itching   Dog Epithelium (Canis Lupus Familiaris) Dermatitis   Nortriptyline Other (See Comments)    GI distress  Other Reaction(s): Not available  nortriptyline   Latex Rash and Dermatitis    The powder in the gloves causes rash  latex  [2]  Current Outpatient Medications on File Prior to Visit  Medication Sig Dispense Refill   albuterol  (VENTOLIN  HFA) 108 (90 Base) MCG/ACT inhaler Inhale 2 puffs into the lungs every 4 (four) hours as needed for wheezing or shortness of breath.     Azelastine HCl 137 MCG/SPRAY SOLN Place into both nostrils.     diclofenac  (VOLTAREN ) 50 MG EC tablet Take 1 tablet (50 mg total) by mouth 2 (two) times daily. 30 tablet 1   hydrochlorothiazide  (HYDRODIURIL ) 12.5 MG tablet Take 1 tablet (12.5 mg total) by mouth daily. 90 tablet 1   hydrOXYzine (ATARAX) 50 MG tablet daily.     levocetirizine (XYZAL ) 5 MG tablet Take 1 tablet by mouth in the evening 90 tablet 0   losartan  (COZAAR ) 50 MG tablet TAKE 1 TABLET(50 MG) BY MOUTH DAILY 90 tablet 0   montelukast  (SINGULAIR ) 10 MG tablet TAKE ONE TABLET BY MOUTH ONCE DAILY FOR sinuses 90 tablet 3   Multiple Vitamin (MULTIVITAMIN) capsule Take 1 capsule by mouth daily.     nortriptyline (PAMELOR) 10 MG capsule Take 10 mg by mouth.     omeprazole  (PRILOSEC) 20 MG capsule Take 1  capsule (20 mg total) by mouth daily. 90 capsule 0   UBRELVY 50 MG TABS Take 50 mg by mouth as directed.     venlafaxine (EFFEXOR) 37.5 MG tablet Take 37.5 mg by mouth.     verapamil (CALAN) 40 MG tablet Take 1 tablet by mouth 2 (two) times daily.     gabapentin  (NEURONTIN ) 300 MG capsule Take 300 mg by mouth as directed. (Patient not taking: Reported on 11/29/2024)     gabapentin  (NEURONTIN ) 300 MG capsule Take 300 mg by mouth 3 (three) times daily. (Patient not taking: Reported on 11/29/2024)     gabapentin  (NEURONTIN ) 400 MG capsule Take 800 mg by mouth every evening. (Patient not taking: Reported on 11/29/2024)     QUEtiapine  (SEROQUEL ) 25 MG tablet Take 3 tablets (75 mg total) by mouth at bedtime. (Patient not taking: Reported on 11/29/2024)     tiZANidine  (ZANAFLEX ) 4 MG tablet Take 1 tablet (4 mg total) by mouth at bedtime as needed for muscle spasms. (Patient not taking: Reported on 11/29/2024) 14 tablet 0   zonisamide (ZONEGRAN) 100 MG capsule Take 100 mg by mouth at bedtime. (Patient not taking: Reported on 11/29/2024)     No current facility-administered medications on file prior to visit.

## 2024-11-29 NOTE — Patient Instructions (Signed)
 Start Augmentin  antibiotics. Take 1 tablet by mouth twice daily for 7 days.  Nasal Congestion/Ear Pressure/Sinus Pressure: Try using Flonase  (fluticasone ) nasal spray. Instill 1 spray in each nostril twice daily.   Continue your allergy medication  It was a pleasure to see you today!

## 2024-11-30 ENCOUNTER — Telehealth: Payer: Self-pay | Admitting: *Deleted

## 2024-11-30 NOTE — Telephone Encounter (Signed)
 The recommendation was to try the oral antibiotics and discuss cream with PCP if warranted.

## 2024-11-30 NOTE — Telephone Encounter (Signed)
 Copied from CRM [id number]. Topic: Clinical - Medication Question >> Nov 29, 2024  4:11 PM Frederich PARAS wrote: Reason for CRM: Patient seen at  office today for accute vist, she stated  she was suppose to be getting a creme, I did not see any medication regarding a creme, calling for provider to send Rx to pharmacy

## 2024-11-30 NOTE — Telephone Encounter (Signed)
 Spoke with pt relaying message. Pt verbalizes understanding will let Matt know if sxs are ongoing after finishing oral med in order to try cream.   Fyi to [facility].

## 2024-11-30 NOTE — Telephone Encounter (Signed)
 noted

## 2024-12-06 NOTE — Progress Notes (Signed)
 Kathy Gamble. Pilar Plate, MD [facility] [email or web address]

## 2024-12-15 ENCOUNTER — Other Ambulatory Visit: Payer: Self-pay | Admitting: Nurse Practitioner

## 2024-12-27 ENCOUNTER — Ambulatory Visit: Admitting: Family Medicine

## 2024-12-27 ENCOUNTER — Ambulatory Visit: Payer: Self-pay

## 2024-12-27 NOTE — Telephone Encounter (Signed)
 FYI Only or Action Required?: FYI only for provider: appointment scheduled on 12/27/24.  Patient was last seen in primary care on 11/29/2024 by Gretta Comer POUR, NP.  Called Nurse Triage reporting Ankle Pain.  Symptoms began a week ago.  Interventions attempted: Nothing.  Symptoms are: unchanged.  Triage Disposition: See Physician Within 24 Hours  Patient/caregiver understands and will follow disposition?: Yes  Copied from CRM #[id number]. Topic: Clinical - Red Word Triage >> Dec 27, 2024  8:21 AM Avram MATSU wrote: Red Word that prompted transfer to Nurse Triage: pain in her left ankle, she has putting heat on for two week. Swollen and red.   ----------------------------------------------------------------------- From previous Reason for Contact - Scheduling: Patient/patient representative is calling to schedule an appointment. Refer to attachments for appointment information. Reason for Disposition  [1] Redness of the skin AND [2] no fever  Answer Assessment - Initial Assessment Questions Scheduled 12/27/24  Advised call back or ED/911 if symptoms occur/worsen: severe diff breathing, chest pain > 5 min, faint. Patient verbalized understanding.   1. ONSET: When did the pain start?      2 weeks 2. LOCATION: Where is the pain located?      Dropped bed base on right leg, ankle and thigh 3. PAIN: How bad is the pain?  (Scale 1-10; or mild, moderate, severe)     8/10; urbrelvy 5. CAUSE: What do you think is causing the ankle pain?     injured 6. OTHER SYMPTOMS: Do you have any other symptoms? (e.g., calf pain, rash, fever, swelling) Denies diff breathing, chest pain, calf pain, faint, fever chills, n/v, no laceration, hot touch/cool, numbness/weakness, blue color  Protocols used: Ankle Pain-A-AH

## 2024-12-27 NOTE — Telephone Encounter (Signed)
 Noted

## 2024-12-31 NOTE — Progress Notes (Unsigned)
 "    Awad Gladd T. Ashleah Valtierra, MD, CAQ Sports Medicine [facility] [address] [place], [address]  Phone: [phone]  FAX: [phone]  ARGELIA FORMISANO - 49 y.o. female  MRN [id number]  Date of Birth: October 09, 1976  Date: 01/01/2025  PCP: Wendee Lynwood HERO, NP  Referral: Wendee Lynwood HERO, NP  No chief complaint on file.  Subjective:   Kathy Gamble is a 49 y.o. very pleasant female patient with There is no height or weight on file to calculate BMI. who presents with the following:  Discussed the use of AI scribe software for clinical note transcription with the patient, who gave verbal consent to proceed.  Patient presents with ongoing leg pain.  She called in last week with some ankle pain on the left side. History of Present Illness     Review of Systems is noted in the HPI, as appropriate  Objective:   LMP  (LMP Unknown)   GEN: No acute distress; alert,appropriate. PULM: Breathing comfortably in no respiratory distress PSYCH: Normally interactive.   Laboratory and Imaging Data:  Assessment and Plan:   No diagnosis found. Assessment & Plan   Medication Management during today's office visit: No orders of the defined types were placed in this encounter.  There are no discontinued medications.  Orders placed today for conditions managed today: No orders of the defined types were placed in this encounter.   Disposition: No follow-ups on file.  [employer] One speech-to-text software was used for transcription in this dictation.  Possible transcriptional errors can occur using Animal nutritionist.   Signed,  Jacques DASEN. Lajuan Godbee, MD   Outpatient Encounter Medications as of 01/01/2025  Medication Sig   albuterol  (VENTOLIN  HFA) 108 (90 Base) MCG/ACT inhaler INHALE 1 TO 2 PUFFS BY MOUTH EVERY 6 HOURS AS NEEDED FOR WHEEZING AND FOR SHORTNESS OF BREATH   amoxicillin -clavulanate (AUGMENTIN ) 875-125 MG tablet Take 1 tablet by mouth 2  (two) times daily.   Azelastine HCl 137 MCG/SPRAY SOLN Place into both nostrils.   diclofenac  (VOLTAREN ) 50 MG EC tablet Take 1 tablet (50 mg total) by mouth 2 (two) times daily.   gabapentin  (NEURONTIN ) 300 MG capsule Take 300 mg by mouth as directed. (Patient not taking: Reported on 11/29/2024)   gabapentin  (NEURONTIN ) 300 MG capsule Take 300 mg by mouth 3 (three) times daily. (Patient not taking: Reported on 11/29/2024)   gabapentin  (NEURONTIN ) 400 MG capsule Take 800 mg by mouth every evening. (Patient not taking: Reported on 11/29/2024)   hydrochlorothiazide  (HYDRODIURIL ) 12.5 MG tablet Take 1 tablet (12.5 mg total) by mouth daily.   hydrOXYzine (ATARAX) 50 MG tablet daily.   levocetirizine (XYZAL ) 5 MG tablet Take 1 tablet by mouth in the evening   losartan  (COZAAR ) 50 MG tablet TAKE 1 TABLET(50 MG) BY MOUTH DAILY   montelukast  (SINGULAIR ) 10 MG tablet TAKE ONE TABLET BY MOUTH ONCE DAILY FOR sinuses   Multiple Vitamin (MULTIVITAMIN) capsule Take 1 capsule by mouth daily.   nortriptyline (PAMELOR) 10 MG capsule Take 10 mg by mouth.   omeprazole  (PRILOSEC) 20 MG capsule Take 1 capsule (20 mg total) by mouth daily.   QUEtiapine  (SEROQUEL ) 25 MG tablet Take 3 tablets (75 mg total) by mouth at bedtime. (Patient not taking: Reported on 11/29/2024)   tiZANidine  (ZANAFLEX ) 4 MG tablet Take 1 tablet (4 mg total) by mouth at bedtime as needed for muscle spasms. (Patient not taking: Reported on 11/29/2024)   UBRELVY 50 MG TABS Take  50 mg by mouth as directed.   venlafaxine (EFFEXOR) 37.5 MG tablet Take 37.5 mg by mouth.   verapamil (CALAN) 40 MG tablet Take 1 tablet by mouth 2 (two) times daily.   zonisamide (ZONEGRAN) 100 MG capsule Take 100 mg by mouth at bedtime. (Patient not taking: Reported on 11/29/2024)   No facility-administered encounter medications on file as of 01/01/2025.   "

## 2025-01-01 ENCOUNTER — Ambulatory Visit: Admitting: Family Medicine

## 2025-01-01 ENCOUNTER — Encounter: Payer: Self-pay | Admitting: Family Medicine

## 2025-01-01 VITALS — BP 108/90 | HR 109 | Temp 98.7°F | Ht 70.0 in | Wt 273.2 lb

## 2025-01-01 DIAGNOSIS — J208 Acute bronchitis due to other specified organisms: Secondary | ICD-10-CM | POA: Diagnosis not present

## 2025-01-01 DIAGNOSIS — S90219A Contusion of unspecified great toe with damage to nail, initial encounter: Secondary | ICD-10-CM

## 2025-01-01 MED ORDER — DOXYCYCLINE HYCLATE 100 MG PO TABS: 100.0000 mg | ORAL_TABLET | Freq: Two times a day (BID) | ORAL | 0 refills | Status: AC

## 2025-01-16 ENCOUNTER — Other Ambulatory Visit: Payer: Self-pay | Admitting: Family Medicine

## 2025-01-16 ENCOUNTER — Other Ambulatory Visit: Payer: Self-pay | Admitting: Nurse Practitioner

## 2025-04-25 ENCOUNTER — Ambulatory Visit
